# Patient Record
Sex: Male | Born: 1935
Health system: Southern US, Community
[De-identification: ages and names within clinical notes are randomized; demographics above are authoritative.]

## PROBLEM LIST (undated history)

## (undated) DIAGNOSIS — E785 Hyperlipidemia, unspecified: Secondary | ICD-10-CM

## (undated) DIAGNOSIS — Z87442 Personal history of urinary calculi: Secondary | ICD-10-CM

## (undated) DIAGNOSIS — Z953 Presence of xenogenic heart valve: Secondary | ICD-10-CM

## (undated) DIAGNOSIS — K219 Gastro-esophageal reflux disease without esophagitis: Secondary | ICD-10-CM

## (undated) DIAGNOSIS — I639 Cerebral infarction, unspecified: Secondary | ICD-10-CM

## (undated) DIAGNOSIS — Z87891 Personal history of nicotine dependence: Secondary | ICD-10-CM

## (undated) DIAGNOSIS — G509 Disorder of trigeminal nerve, unspecified: Secondary | ICD-10-CM

## (undated) DIAGNOSIS — M1990 Unspecified osteoarthritis, unspecified site: Secondary | ICD-10-CM

## (undated) DIAGNOSIS — K3189 Other diseases of stomach and duodenum: Secondary | ICD-10-CM

## (undated) DIAGNOSIS — I10 Essential (primary) hypertension: Secondary | ICD-10-CM

## (undated) DIAGNOSIS — R002 Palpitations: Secondary | ICD-10-CM

## (undated) DIAGNOSIS — I2510 Atherosclerotic heart disease of native coronary artery without angina pectoris: Secondary | ICD-10-CM

## (undated) DIAGNOSIS — I739 Peripheral vascular disease, unspecified: Secondary | ICD-10-CM

## (undated) DIAGNOSIS — R42 Dizziness and giddiness: Secondary | ICD-10-CM

## (undated) DIAGNOSIS — R1013 Epigastric pain: Secondary | ICD-10-CM

## (undated) DIAGNOSIS — I4892 Unspecified atrial flutter: Secondary | ICD-10-CM

## (undated) DIAGNOSIS — I4891 Unspecified atrial fibrillation: Secondary | ICD-10-CM

## (undated) DIAGNOSIS — R001 Bradycardia, unspecified: Secondary | ICD-10-CM

## (undated) DIAGNOSIS — Z951 Presence of aortocoronary bypass graft: Secondary | ICD-10-CM

## (undated) DIAGNOSIS — N529 Male erectile dysfunction, unspecified: Secondary | ICD-10-CM

## (undated) DIAGNOSIS — I359 Nonrheumatic aortic valve disorder, unspecified: Secondary | ICD-10-CM

## (undated) HISTORY — DX: Nonrheumatic aortic valve disorder, unspecified: I35.9

## (undated) HISTORY — DX: Hyperlipidemia, unspecified: E78.5

## (undated) HISTORY — DX: Cerebral infarction, unspecified: I63.9

## (undated) HISTORY — DX: Epigastric pain: R10.13

## (undated) HISTORY — DX: Personal history of nicotine dependence: Z87.891

## (undated) HISTORY — DX: Palpitations: R00.2

## (undated) HISTORY — PX: LITHOTRIPSY: SUR834

## (undated) HISTORY — PX: CATARACT EXTRACTION: SUR2

## (undated) HISTORY — DX: Other diseases of stomach and duodenum: K31.89

## (undated) HISTORY — PX: MOUTH SURGERY: SHX715

## (undated) HISTORY — DX: Male erectile dysfunction, unspecified: N52.9

## (undated) HISTORY — DX: Peripheral vascular disease, unspecified: I73.9

## (undated) HISTORY — DX: Essential (primary) hypertension: I10

## (undated) HISTORY — DX: Atherosclerotic heart disease of native coronary artery without angina pectoris: I25.10

---

## 2002-07-16 ENCOUNTER — Encounter: Admission: RE | Admit: 2002-07-16 | Discharge: 2002-07-16 | Payer: Self-pay | Admitting: Family Medicine

## 2002-07-16 ENCOUNTER — Encounter: Payer: Self-pay | Admitting: Family Medicine

## 2002-08-07 ENCOUNTER — Ambulatory Visit (HOSPITAL_BASED_OUTPATIENT_CLINIC_OR_DEPARTMENT_OTHER): Admission: RE | Admit: 2002-08-07 | Discharge: 2002-08-07 | Payer: Self-pay | Admitting: Urology

## 2002-08-07 ENCOUNTER — Encounter: Payer: Self-pay | Admitting: Urology

## 2004-02-12 ENCOUNTER — Ambulatory Visit: Payer: Self-pay | Admitting: Cardiology

## 2004-02-12 ENCOUNTER — Inpatient Hospital Stay (HOSPITAL_COMMUNITY): Admission: EM | Admit: 2004-02-12 | Discharge: 2004-02-13 | Payer: Self-pay | Admitting: Emergency Medicine

## 2004-02-17 ENCOUNTER — Ambulatory Visit: Payer: Self-pay

## 2005-04-28 ENCOUNTER — Ambulatory Visit: Payer: Self-pay

## 2005-04-28 ENCOUNTER — Encounter: Payer: Self-pay | Admitting: Cardiology

## 2005-05-03 ENCOUNTER — Ambulatory Visit: Payer: Self-pay | Admitting: Cardiology

## 2005-05-26 ENCOUNTER — Ambulatory Visit: Payer: Self-pay

## 2005-05-26 ENCOUNTER — Ambulatory Visit: Payer: Self-pay | Admitting: Cardiology

## 2007-06-06 ENCOUNTER — Ambulatory Visit: Payer: Self-pay

## 2007-06-06 ENCOUNTER — Ambulatory Visit: Payer: Self-pay | Admitting: Cardiology

## 2007-06-06 ENCOUNTER — Encounter: Payer: Self-pay | Admitting: Cardiology

## 2009-06-03 DIAGNOSIS — R079 Chest pain, unspecified: Secondary | ICD-10-CM | POA: Insufficient documentation

## 2009-06-03 DIAGNOSIS — I10 Essential (primary) hypertension: Secondary | ICD-10-CM | POA: Insufficient documentation

## 2009-06-03 DIAGNOSIS — E785 Hyperlipidemia, unspecified: Secondary | ICD-10-CM | POA: Insufficient documentation

## 2009-06-03 DIAGNOSIS — K3189 Other diseases of stomach and duodenum: Secondary | ICD-10-CM | POA: Insufficient documentation

## 2009-06-03 DIAGNOSIS — E7800 Pure hypercholesterolemia, unspecified: Secondary | ICD-10-CM | POA: Insufficient documentation

## 2009-06-03 DIAGNOSIS — R1013 Epigastric pain: Secondary | ICD-10-CM

## 2009-06-04 ENCOUNTER — Ambulatory Visit: Payer: Self-pay | Admitting: Cardiology

## 2009-06-04 ENCOUNTER — Encounter: Payer: Self-pay | Admitting: Cardiology

## 2009-06-04 ENCOUNTER — Ambulatory Visit: Payer: Self-pay | Admitting: Cardiovascular Disease

## 2009-06-04 ENCOUNTER — Ambulatory Visit: Payer: Self-pay

## 2009-06-04 ENCOUNTER — Ambulatory Visit (HOSPITAL_COMMUNITY): Admission: RE | Admit: 2009-06-04 | Discharge: 2009-06-04 | Payer: Self-pay | Admitting: Cardiology

## 2009-06-04 DIAGNOSIS — F17200 Nicotine dependence, unspecified, uncomplicated: Secondary | ICD-10-CM | POA: Insufficient documentation

## 2009-11-03 ENCOUNTER — Encounter: Admission: RE | Admit: 2009-11-03 | Discharge: 2009-11-03 | Payer: Self-pay | Admitting: Family Medicine

## 2009-12-17 ENCOUNTER — Ambulatory Visit: Payer: Self-pay | Admitting: Family Medicine

## 2010-02-11 ENCOUNTER — Encounter
Admission: RE | Admit: 2010-02-11 | Discharge: 2010-02-11 | Payer: Self-pay | Source: Home / Self Care | Attending: Neurology | Admitting: Neurology

## 2010-04-05 NOTE — Assessment & Plan Note (Signed)
Summary: FLU SHOT/EVM  Nurse Visit   Allergies: No Known Drug Allergies  Immunizations Administered:  Influenza Vaccine:    Vaccine Type: FLUZONE    Site: left deltoid    Mfr: Sanofi Pasteur    Dose: 0.5 ml    Route: IM    Given by: Levonne Spiller EMT-P    Exp. Date: 09/03/2010    Lot #: [id number]    VIS given: 09/28/09 version given December 17, 2009.   Immunizations Administered:  Influenza Vaccine:    Vaccine Type: FLUZONE    Site: left deltoid    Mfr: Sanofi Pasteur    Dose: 0.5 ml    Route: IM    Given by: Levonne Spiller EMT-P    Exp. Date: 09/03/2010    Lot #: [id number]    VIS given: 09/28/09 version given December 17, 2009.  Flu Vaccine Consent Questions:    Do you have a history of severe allergic reactions to this vaccine? no    Any prior history of allergic reactions to egg and/or gelatin? no    Do you have a sensitivity to the preservative Thimersol? no    Do you have a past history of Guillan-Barre Syndrome? no    Do you currently have an acute febrile illness? no    Have you ever had a severe reaction to latex? no    Vaccine information given and explained to patient? yes

## 2010-04-05 NOTE — Assessment & Plan Note (Signed)
Summary: 2 YR   Primary Provider:  Manus Gunning  CC:  2 year return.  Pt has no cardiac concerns at this time.  Sean Brock  History of Present Illness: The patient is a 76 years old and returns for followup management of aortic stenosis. He has known aortic valve stenosis and had an echocardiogram in April 2009 which showed a mean aortic valve gradient of 17 and a posterior wall thickness of 12 mm. He's done well since that time has had no symptoms related to his heart. The siblings had no chest pain shortness of breath or palpitations.  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 He academy is going through quite a bit of changes recently.  his wife is a Engineer, civil (consulting).  His other problems include hypertension, hyperlipidemia, and continued cigarette use. He also has a brother who has had 2 bypass surgeries.  Current Medications (verified): 1)  Flomax 0.4 Mg Caps (Tamsulosin Hcl) .... Take One Capsule Daily 2)  Toprol Xl 50 Mg Xr24h-Tab (Metoprolol Succinate) .... Take One Tablet Once Daily 3)  Crestor 10 Mg Tabs (Rosuvastatin Calcium) .... Take One Tablet Once Daily 4)  Aspirin 81 Mg Tabs (Aspirin) .... Take One Tablet Once Daily 5)  Triamterene-Hctz 37.5-25 Mg Tabs (Triamterene-Hctz) .... Take One Tablet Once Daily 6)  Viagra 50 Mg Tabs (Sildenafil Citrate) .... Uad 7)  Fish Oil 1000 Mg Caps (Omega-3 Fatty Acids) .... Take One [place] Two Times A Day  Allergies (verified): No Known Drug Allergies  Past History:  Past Medical History: Reviewed history from 06/03/2009 and no changes required. Current Problems:  PALPITATIONS (ICD-785.1) CHEST PAIN (ICD-786.50) HYPERLIPIDEMIA (ICD-272.4) AORTIC STENOSIS (ICD-424.1) HYPERTENSION (ICD-401.9) DYSPEPSIA (ICD-536.8)  Review of Systems       ROS is negative except as outlined in HPI.   Vital Signs:  Patient profile:   76 year old male Height:      74.5 inches Weight:      203 pounds BMI:     25.81 Pulse rate:   44 / minute Pulse rhythm:    regular BP sitting:   146 / 82  (left arm) Cuff size:   large  Vitals Entered By: Judithe Modest CMA (June 04, 2009 3:17 PM)  Physical Exam  Additional Exam:  Gen. Well-nourished, in no distress   Neck: No JVD, thyroid not enlarged, no carotid bruits Lungs: No tachypnea, clear without rales, rhonchi or wheezes Cardiovascular: Rhythm regular, PMI not displaced,  heart sounds  normal, no murmurs or gallops, no peripheral edema, pulses normal in all 4 extremities. Abdomen: BS normal, abdomen soft and non-tender without masses or organomegaly, no hepatosplenomegaly. MS: No deformities, no cyanosis or clubbing   Neuro:  No focal sns   Skin:  no lesions    Impression & Recommendations:  Problem # 1:  AORTIC STENOSIS (ICD-424.1) He has known aortic stenosis. Is not having any symptoms. I reviewed his echocardiogram from today and his mean aortic valve gradient was 18 mm. His left ventricular wall thickness was normal and his LV function was normal. We will continue to follow this and I have arranged for him to have a follow up visit in 2 years with an echocardiogram repeated at that time. He will see Dr. Shirlee Latch at that time. His updated medication list for this problem includes:    Toprol Xl 50 Mg Xr24h-tab (Metoprolol succinate) .Sean Brock... Take one tablet once daily    Triamterene-hctz 37.5-25 Mg Tabs (Triamterene-hctz) .Sean Brock... Take one tablet once daily  Orders:  EKG w/ Interpretation (93000)  Problem # 2:  HYPERTENSION (ICD-401.9) His blood pressure slightly elevated today but he says it normally runs in the 130s. We will continue current therapy. His updated medication list for this problem includes:    Toprol Xl 50 Mg Xr24h-tab (Metoprolol succinate) .Sean Brock... Take one tablet once daily    Aspirin 81 Mg Tabs (Aspirin) .Sean Brock... Take one tablet once daily    Triamterene-hctz 37.5-25 Mg Tabs (Triamterene-hctz) .Sean Brock... Take one tablet once daily  Problem # 3:  HYPERLIPIDEMIA (ICD-272.4) This is  currently being treated with Crestor. His updated medication list for this problem includes:    Crestor 10 Mg Tabs (Rosuvastatin calcium) .Sean Brock... Take one tablet once daily  Problem # 4:  CIGARETTE SMOKER (ICD-305.1)  He continues to smoke. I strongly encouraged him with his multiple risk factors are her decreasing and discontinuing cigarette smoking.  Patient Instructions: 1)  Your physician wants you to follow-up in: 2 years with Dr. Shirlee Latch.  You will receive a reminder letter in the mail two months in advance. If you don't receive a letter, please call our office to schedule the follow-up appointment. 2)  You will be scheduled for an echocardiogram in 2 years when you come to see Dr. Shirlee Latch.

## 2010-04-21 ENCOUNTER — Other Ambulatory Visit: Payer: Self-pay | Admitting: Family Medicine

## 2010-04-21 DIAGNOSIS — E041 Nontoxic single thyroid nodule: Secondary | ICD-10-CM

## 2010-04-29 ENCOUNTER — Ambulatory Visit
Admission: RE | Admit: 2010-04-29 | Discharge: 2010-04-29 | Disposition: A | Discharge status: Home / Self Care | Payer: Medicare Other | Source: Ambulatory Visit | Attending: Family Medicine | Admitting: Family Medicine

## 2010-04-29 DIAGNOSIS — E041 Nontoxic single thyroid nodule: Secondary | ICD-10-CM

## 2010-07-19 NOTE — Assessment & Plan Note (Signed)
[facility]                            CARDIOLOGY OFFICE NOTE   GRANVILLE, WHITEFIELD                      MRN:          [id number]  DATE:06/06/2007                            DOB:          February 23, 1935    PRIMARY CARE PHYSICIAN:  Bryan Lemma. Manus Gunning, M.D.   CLINICAL HISTORY:  Mr. Gasaway is 76 years old and returns for followup  management of his aortic stenosis.  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  He says he has been doing quite well.  Has had no recent chest pain, shortness breath or palpitations.  He has  been a smoker and he still is intermittently smoking.  His last  echocardiogram 2 years ago showed mean aortic valve gradient of 16 mm  and he had one done today which showed a mean gradient of 17 mm.  His LV  function was normal.  His LV posterior wall thickness was borderline,  increased at 12 mm.   PAST MEDICAL HISTORY:  1. Hypertension.  2. Cigarette use.   FAMILY HISTORY:  He also has a positive family history of coronary heart  disease with a brother who had bypass surgery.   CURRENT MEDICATIONS:  Aspirin, metoprolol, Crestor and  hydrochlorothiazide, Megace, and fish oil.   PHYSICAL EXAMINATION:  VITAL SIGNS:  Blood pressure 148/82 and pulse 48  and regular.  NECK:  There was no venous tension.  The carotid pulses were full  without bruits.  Carotid pulses were felt normal and there were faintly  transmitted bruits.  CHEST:  Was clear without rales or rhonchi.  HEART:  Rhythm is regular.  There was a 3/6 harsh systolic ejection  murmur at the left sternal edge radiating to the base and faintly to the  neck.  S2 was single and I could hear no diastolic murmur.  ABDOMEN:  Soft with normal bowel sounds.  There is no  hepatosplenomegaly.  EXTREMITIES:  Peripheral pulses were full. No peripheral edema.   LABORATORY DATA:  An electrocardiogram was normal.   IMPRESSION:  1. Mild to moderate aortic  stenosis, asymptomatic.  2. Hypertension under optimal control.  3. Cigarette use.  4. Positive family history of coronary artery disease.  5. Hyperlipidemia   RECOMMENDATIONS:  Mr. Gentzler remains asymptomatic and his aortic  stenosis does not appear to have progressed over the last 2 years.  I  told him he was as much at risk from coronary disease as from his aortic  stenosis and I gave him strong encouragement to smoke his cigarettes.  Blood pressures also not on target.  His wife is a Engineer, civil (consulting) and will check  his blood pressure at home.  I advised him to let Dr. Manus Gunning know if  his pressure is over 130 on a regular basis.  I will plan to see him  back in 2 years and we will do an echocardiogram prior to that visit.     Bruce Elvera Lennox Juanda Chance, MD, [facility]  Electronically Signed    BRB/MedQ  DD: 06/06/2007  DT: 06/06/2007  Job #: [id number]

## 2010-07-22 NOTE — Discharge Summary (Signed)
NAMEANDONI, BUSCH               ACCOUNT NO.:  [id number]   MEDICAL RECORD NO.:  [id number]          PATIENT TYPE:  INP   LOCATION:  3733                         FACILITY:  [facility]   PHYSICIAN:  Olga Millers, M.D. LHCDATE OF BIRTH:  [birth year]   DATE OF ADMISSION:  02/12/2004  DATE OF DISCHARGE:  02/13/2004                                 DISCHARGE SUMMARY   DISCHARGE DIAGNOSES:  1.  Chest pain, etiology unclear.  2.  Palpitations.      1.  Premature ventricular contractions noted on monitor and          electrocardiogram.  3.  History of coronary artery disease by heart catheterization in 1984.      1.  90% stenosis of the right coronary artery and complete occlusion of          the posterior descending artery, well preserved left ventricular          wall motion at that time--medical therapy recommended.  4.  Dyspepsia.  5.  Positive tobacco abuse.   HOSPITAL COURSE:  Please see the admission history and physical from  February 12, 2004, for complete details. Briefly, this 76 year old male  presented upon referral to the ER by his primary care physician. The patient  was complaining of palpitations and atypical chest discomfort. He was noted  to have PVCs on echocardiogram and telemetry. He was placed on low dose beta  blocker therapy with Lopressor 12.5 mg b.i.d. He was kept on his aspirin. He  was also placed on Protonix for GI prophylaxis as well as for treatment of  his dyspepsia. He had noticed that this had worsened recently. Enzymes were  cycled. At the time of this dictation those have been negative and his last  set is still pending. As long as his last set of enzymes are negative he  will be discharged to home. We plan to follow up with an outpatient stress  study in our office next week. The patient has also has a murmur on exam  that sounds like mild alert aortic stenosis. He will have an echocardiogram  next week as well. We will bring him back and follow up with  Dr. Juanda Chance  after the tests are complete.   LABORATORY DATA:  White count 8000, hemoglobin 14.8, hematocrit 42.3,  platelet count 278,000. INR 0.9.  Sodium 137, potassium 4, chloride 104, CO2  26, glucose 97, BUN 14, creatinine 0.8, total bilirubin 0.9, alkaline  phosphatase 37, AST 13, ALT 17, total protein 6.3, albumin 3.9, calcium 8.6.  Total CK on December 9th at 12:00 117, MB 3.5, troponin-I 0.01. On December  9th at 16:40, CK 102, CK-MB 3.0, troponin 0.01. on December 9th at 23:30, CK  96, MB 2.5, troponin-I 0.02.   Admission chest x-ray revealed no active disease.   Discharge vital signs are temperature 97.9, pulse 54, respirations 16, blood  pressure 106/66, oxygen saturation 96% on room air.   DISCHARGE MEDICATIONS:  1.  Protonix 40 mg daily.  2.  Toprol XL 25 mg daily.  3.  Enteric-coated aspirin 81 mg daily.  PAIN MANAGEMENT:  Tylenol as needed. He is to call office or 9-1-1 for  recurrent chest pain.   ACTIVITY:  Nothing strenuous until after his stress test.   DIET:  Low fat, low sodium. He has been asked to reduce his caffeine intake.   FOLLOWUP:  The patient will have a stress test with [facility] on  December 14th at 12:30 p.m. He will have an echocardiogram with [facility] on December 16th at 7:30 a.m. He will see Dr. Juanda Chance on March 09, 2004, at 3 p.m. He should follow up with his primary care physician, Dr.  Manus Gunning, as scheduled.       SW/MEDQ  D:  02/13/2004  T:  02/13/2004  Job:  [id number]   cc:   Cecil Cranker, M.D.   Bryan Lemma. Manus Gunning, M.D.  [address] [place]  [place] [address]  Fax: [phone]

## 2010-07-22 NOTE — H&P (Signed)
NAMEGILES, CURRIE NO.:  [id number]   MEDICAL RECORD NO.:  [id number]          PATIENT TYPE:  EMS   LOCATION:  MAJO                         FACILITY:  [facility]   PHYSICIAN:  Olga Millers, M.D. LHCDATE OF BIRTH:  Aug 06, 1934   DATE OF ADMISSION:  02/12/2004  DATE OF DISCHARGE:                                HISTORY & PHYSICAL   PRIMARY CARE PHYSICIAN:  Bryan Lemma. Manus Gunning, M.D.   PRIMARY CARDIOLOGIST:  Charlies Constable, M.D. [facility]   SUMMARY OF HISTORY:  Mr. Laraia is a 76 year old white male who is referred  to [facility] emergency room by his primary care physician secondary to  frequent PVCs. Mr. Tsutsui states that earlier this week while at work on  Monday he noticed what he describes as a nervous  heartbeat. He denied any  associated symptoms or rapid rate. However, he did stop his work and walk  outside to get some fresh air and this relieved his nervous heart beat.  The duration was approximately ten minutes. On Tuesday and Wednesday, he had  similar episodes. On Thursday evening, however, at approximately 6 or 7 p.m.  he again developed a nervous heart beat. He said that this was intermittent  over the next four to five hours. This seemed to improve with exertion. He  also noted some indigestion in the xiphoid with increased gas. He did not  experience any radiation of the discomfort, shortness of breath, nausea,  vomiting, or diaphoresis. He did state that he felt hot.  This morning,  initially when he awoke he felt fine; however, after getting up out of bed  he again developed similar symptoms. He went to his primary care physician  who noted increased PVCs and sinus bradycardia on his EKGs and referred him  to [place].  Mr. Ferris denies any prior history nor has he had any problems with  chest discomfort, exertional fatigue, shortness of breath, dyspnea on  exertion, or syncopal episodes.   PAST MEDICAL HISTORY:  The patient developed a rash with Cardizem. He is  on  aspirin 81 mg daily. His history is notable for cardiac catheterization in  1984 for exertional fatigue. Catheterization on November 30, 1982 by Dr.  Juanda Chance showed a 40% mid LAD, 90% proximal RCA, and 70% distal diffusely  narrowing, but a faint inconsistent PL branches, and a total PDA. He had  good left to right collaterals. LV function was reported to be normal. Dr.  Juanda Chance felt that he had one-vessel coronary artery disease which should be  medically managed. It was also noted that in 1990 he was admitted overnight  by Dr. Alanda Amass for palpitations. The patient denies any history of  diabetes, hypertension, COPD, MI, CVA, bleeding dyscrasias, thyroid  dysfunction, and he does not know his cholesterol.   PAST SURGICAL HISTORY:  Bilateral cataract repair and kidney stone.  According to his past discharge summary, he does have a history of  hyperlipidemia, particularly triglycerides and a history of hypertension.   SOCIAL HISTORY:  He resides in [place] with his wife and son. 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  No  grandchildren. He smokes half a pack per day intermittently for the last 40  years. He drinks a glass of homemade wine mixed with diet Coke one time per  week. He denies any drugs, herbal medication, diet, or exercise program.   FAMILY HISTORY:  Mother died at the age of 25 of diabetes and post  myocardial infarction. His father died at the age of 2 with heart problems  and a  history of hypertension. One sister deceased secondary to lung  cancer, age 32. One brother and one sister are alive. His brother has had  two bypass surgeries and his sister also has heart problems.   REVIEW OF SYSTEMS:  Notable for chronic sinus problems, vertigo, glasses,  cough productive of white phlegm, increased stress at work, bilateral  shoulder and hip arthralgias, and GERD.   PHYSICAL EXAMINATION:  GENERAL: A well-nourished, well-developed,  pleasant  white male in no apparent distress.  VITAL SIGNS: Temperature 96.5, blood pressure 155/85, pulse 61 and slightly  irregular, respirations 16, and 98% on room air.  HEENT: Unremarkable except for glasses.  NECK: Supple without thyromegaly, adenopathy, or JVD. He does have a right  carotid bruit. It is difficult to discern if he has a left carotid bruits  versus radiated murmur.  HEART: Slightly irregular, normal S1 and S2. He has a 2/6  crescendo/decrescendo systolic murmur best appreciated at the left sternal  border. It is difficult to tell if it radiates into the carotids. It does  intensify with sitting up and leaning forward. All pulses are symmetrical  and intact without femoral or abdominal bruits.  LUNGS: Decreased breath sounds, but clear to auscultation without rales,  rhonchi, or wheezes.  SKIN: Integument intact.  ABDOMEN: Bowel sounds present, soft without organomegaly, masses, or  tenderness.  EXTREMITIES: No clubbing, cyanosis, or edema.  MUSCULOSKELETAL/NEUROLOGIC: Unremarkable.   Chest x-ray is pending. EKG shows sinus bradycardia with normal axis, normal  intervals. Old EKG is not available for comparison.   H&H is 14.8 and 42.3 with normal indices, platelet count 278,000, WBC 8.0,  sodium 137, potassium 4.0, BUN 14, creatinine 0.9, glucose 97, normal LFTs.  Initial CK and troponin in the emergency room are negative. PTT 32, PT 12.7.   IMPRESSION:  Palpitations. Rule out frequent premature ventricular  contractions, nonsustained ventricular tachycardia, atypical chest  discomfort with above, systolic murmur, known history of coronary artery  disease by cardiac catheterization in 1984 without evaluation since that  time.   DISPOSITION:  Dr. Jens Som has reviewed the patient's history, spoke with  and examined the patient. We will admit him for overnight observation, cycle enzymes to rule out myocardial infarction.  We will continue him on  telemetry to  rule out ventricular tachycardia. If bloodwork is negative,  maybe he will be discharged in the morning with an outpatient stress test  that has been set up for Wednesday, February 17, 2004, at 12:30 p.m. and an  echocardiogram at February 19, 2004, at 7:30 a.m. He will then follow up  with Dr. Juanda Chance on March 09, 2004, at 3 p.m. for these tests. Depending on  findings he may need an outpatient event monitor. During his hospital  admission we will continue his baby aspirin daily. We will also add Protonix  40 mg daily and low dose Lopressor.       EW/MEDQ  D:  02/12/2004  T:  02/12/2004  Job:  [id number]  cc:   Bryan Lemma. Manus Gunning, M.D.  [address] [place]  [place] [address]  Fax: [phone]   Charlies Constable, M.D. [facility]

## 2011-06-05 ENCOUNTER — Ambulatory Visit: Payer: Medicare Other | Admitting: Cardiology

## 2011-06-08 ENCOUNTER — Encounter: Payer: Self-pay | Admitting: *Deleted

## 2011-06-09 ENCOUNTER — Encounter: Payer: Self-pay | Admitting: Cardiology

## 2011-06-09 ENCOUNTER — Ambulatory Visit (INDEPENDENT_AMBULATORY_CARE_PROVIDER_SITE_OTHER): Payer: Medicare Other | Admitting: Cardiology

## 2011-06-09 VITALS — BP 122/72 | HR 49 | Resp 19 | Ht 73.0 in | Wt 208.8 lb

## 2011-06-09 DIAGNOSIS — I498 Other specified cardiac arrhythmias: Secondary | ICD-10-CM

## 2011-06-09 DIAGNOSIS — R001 Bradycardia, unspecified: Secondary | ICD-10-CM

## 2011-06-09 DIAGNOSIS — I359 Nonrheumatic aortic valve disorder, unspecified: Secondary | ICD-10-CM

## 2011-06-09 DIAGNOSIS — I350 Nonrheumatic aortic (valve) stenosis: Secondary | ICD-10-CM

## 2011-06-09 DIAGNOSIS — E785 Hyperlipidemia, unspecified: Secondary | ICD-10-CM

## 2011-06-09 NOTE — Assessment & Plan Note (Signed)
Some mild lightheadedness with standing.  I do not think this is likely to be related to AS.  Mild to moderate AS on last echo in 2011.  He has an AS-type murmur, suspect moderate by murmur.  I will arrange a repeat echocardiogram.

## 2011-06-09 NOTE — Assessment & Plan Note (Signed)
Followed by Dr. Manus Gunning.

## 2011-06-09 NOTE — Assessment & Plan Note (Signed)
Occasional lightheadedness with standing and HR in the 40s.  He takes short-acting metoprolol once a day. I will stop metoprolol.  We will call him in 2 wks to see what his BP and pulse is running.  He will check daily.

## 2011-06-09 NOTE — Progress Notes (Signed)
PCP: Dr. Manus Gunning  77 yo with history of HTN and aortic stenosis presents for cardiology followup.  He has been seen by Dr. Juanda Chance in the past and is seen by me for the first time today.  He has been doing well since last appointment.  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  HR is in the 40s today.  He is occasionally lightheaded with standing.  No syncope.  He is taking metoprolol 50 mg once daily.  He occasionally checks his BP, it is always < 140 systolic.  No chest pain, no exertional dyspnea.  Only limitation to activity has been hip pain.    ECG: sinus bradycardia at 46  PMH: 1. HTN 2. Hyperlipidemia 3. Vertigo 4. Aortic stenosis: Mild to moderate.  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  Married.  Nonsmoker.   FH: Brother with CABG.  Sister with aortic stenosis s/p AVR  ROS: All systems reviewed and negative except as per HPI.   Current Outpatient Prescriptions  Medication Sig Dispense Refill  . aspirin 81 MG tablet Take 81 mg by mouth daily.      . fish oil-omega-3 fatty acids 1000 MG capsule Take 2 g by mouth daily.      . rosuvastatin (CRESTOR) 10 MG tablet Take 10 mg by mouth daily.      . sildenafil (VIAGRA) 50 MG tablet Take 50 mg by mouth daily as needed.      . Tamsulosin HCl (FLOMAX) 0.4 MG CAPS Take by mouth.      . triamterene-hydrochlorothiazide (DYAZIDE) 37.5-25 MG per capsule Take 1 capsule by mouth every morning.        BP 122/72  Pulse 49  Resp 19  Ht 6\' 1"  (1.854 m)  Wt 208 lb 12.8 oz (94.711 kg)  BMI 27.55 kg/m2 General: NAD Neck: No JVD, no thyromegaly or thyroid nodule.  Lungs: Clear to auscultation bilaterally with normal respiratory effort. CV: Nondisplaced PMI.  Heart regular S1/S2, no S3/S4, 3/6 mid-peaking systolic murmur, S2 is heard clearly.  No peripheral edema.  No carotid bruit.  Normal pedal pulses.  Abdomen: Soft, nontender, no  hepatosplenomegaly, no distention.  Neurologic: Alert and oriented x 3.  Psych: Normal affect. Extremities: No clubbing or cyanosis.

## 2011-06-09 NOTE — Patient Instructions (Signed)
Stop metoprolol.  Take and record your blood pressure and pulse every day. I will call you in 2 weeks to get the readings. Luana Shu [phone]  Your physician has requested that you have an echocardiogram. Echocardiography is a painless test that uses sound waves to create images of your heart. It provides your doctor with information about the size and shape of your heart and how well your heart's chambers and valves are working. This procedure takes approximately one hour. There are no restrictions for this procedure. In the next week or so.  Your physician wants you to follow-up in: 1 year with Dr Shirlee Latch. (April 2014).You will receive a reminder letter in the mail two months in advance. If you don't receive a letter, please call our office to schedule the follow-up appointment.

## 2011-06-14 ENCOUNTER — Ambulatory Visit (INDEPENDENT_AMBULATORY_CARE_PROVIDER_SITE_OTHER): Payer: Medicare Other | Admitting: *Deleted

## 2011-06-14 ENCOUNTER — Telehealth: Payer: Self-pay | Admitting: *Deleted

## 2011-06-14 VITALS — BP 154/86 | HR 60 | Ht 73.0 in | Wt 208.0 lb

## 2011-06-14 DIAGNOSIS — I10 Essential (primary) hypertension: Secondary | ICD-10-CM

## 2011-06-14 MED ORDER — AMLODIPINE BESYLATE 2.5 MG PO TABS: 2.5000 mg | ORAL_TABLET | Freq: Every day | ORAL | Status: DC

## 2011-06-14 NOTE — Progress Notes (Signed)
Walk-in patient C/O of his blood pressure going up every day since Metoprolol was d/C on 06/09/11. B/P today is 154/86 pulse 60 beats/minute. Dr. Shirlee Latch recommends to start on Amlodipine 2.5 mg by mouth once a day . Patient aware, order send to [facility] pharmacy.

## 2011-06-14 NOTE — Telephone Encounter (Signed)
Walk-in pt seen by Nurse. See nurse visit note.

## 2011-06-16 ENCOUNTER — Other Ambulatory Visit: Payer: Self-pay

## 2011-06-16 ENCOUNTER — Ambulatory Visit (HOSPITAL_COMMUNITY): Payer: Medicare Other | Attending: Internal Medicine

## 2011-06-16 DIAGNOSIS — I079 Rheumatic tricuspid valve disease, unspecified: Secondary | ICD-10-CM | POA: Insufficient documentation

## 2011-06-16 DIAGNOSIS — R079 Chest pain, unspecified: Secondary | ICD-10-CM | POA: Insufficient documentation

## 2011-06-16 DIAGNOSIS — R002 Palpitations: Secondary | ICD-10-CM | POA: Insufficient documentation

## 2011-06-16 DIAGNOSIS — I498 Other specified cardiac arrhythmias: Secondary | ICD-10-CM | POA: Insufficient documentation

## 2011-06-16 DIAGNOSIS — I350 Nonrheumatic aortic (valve) stenosis: Secondary | ICD-10-CM

## 2011-06-16 DIAGNOSIS — I059 Rheumatic mitral valve disease, unspecified: Secondary | ICD-10-CM | POA: Insufficient documentation

## 2011-06-16 DIAGNOSIS — I359 Nonrheumatic aortic valve disorder, unspecified: Secondary | ICD-10-CM | POA: Insufficient documentation

## 2011-06-16 DIAGNOSIS — E785 Hyperlipidemia, unspecified: Secondary | ICD-10-CM | POA: Insufficient documentation

## 2011-06-16 DIAGNOSIS — I10 Essential (primary) hypertension: Secondary | ICD-10-CM | POA: Insufficient documentation

## 2011-07-04 ENCOUNTER — Telehealth: Payer: Self-pay | Admitting: Cardiology

## 2011-07-04 NOTE — Telephone Encounter (Signed)
Spoke with pt about BP readings pt brought into office. Amlodipine increased 06/30/11 by PCP. No new recommendations per Dr Shirlee Latch.

## 2011-07-04 NOTE — Telephone Encounter (Signed)
New Problem:     Patient returned your phone call.  Please call back. 

## 2011-07-11 ENCOUNTER — Encounter: Payer: Self-pay | Admitting: *Deleted

## 2011-07-11 ENCOUNTER — Encounter: Payer: Self-pay | Admitting: Physician Assistant

## 2011-07-11 ENCOUNTER — Ambulatory Visit (INDEPENDENT_AMBULATORY_CARE_PROVIDER_SITE_OTHER): Payer: Medicare Other | Admitting: Physician Assistant

## 2011-07-11 VITALS — BP 140/80 | HR 56 | Ht 74.0 in | Wt 210.8 lb

## 2011-07-11 DIAGNOSIS — R002 Palpitations: Secondary | ICD-10-CM

## 2011-07-11 DIAGNOSIS — R42 Dizziness and giddiness: Secondary | ICD-10-CM

## 2011-07-11 DIAGNOSIS — R079 Chest pain, unspecified: Secondary | ICD-10-CM

## 2011-07-11 LAB — BASIC METABOLIC PANEL
BUN: 17 mg/dL (ref 6–23)
CO2: 27 mEq/L (ref 19–32)
Calcium: 9.4 mg/dL (ref 8.4–10.5)
Chloride: 103 mEq/L (ref 96–112)
Creatinine, Ser: 0.8 mg/dL (ref 0.4–1.5)
GFR: 107.82 mL/min (ref >60.00)
Glucose, Bld: 82 mg/dL (ref 70–99)
Potassium: 3.6 mEq/L (ref 3.5–5.1)
Sodium: 140 mEq/L (ref 135–145)

## 2011-07-11 LAB — CBC WITH DIFFERENTIAL/PLATELET
Basophils Absolute: 0 10*3/uL (ref 0.0–0.1)
Basophils Relative: 0.7 % (ref 0.0–3.0)
Eosinophils Absolute: 0.1 10*3/uL (ref 0.0–0.7)
Eosinophils Relative: 1.8 % (ref 0.0–5.0)
HCT: 44.3 % (ref 39.0–52.0)
Hemoglobin: 14.9 g/dL (ref 13.0–17.0)
Lymphocytes Relative: 27.9 % (ref 12.0–46.0)
Lymphs Abs: 1.9 10*3/uL (ref 0.7–4.0)
MCHC: 33.6 g/dL (ref 30.0–36.0)
Monocytes Absolute: 0.7 10*3/uL (ref 0.1–1.0)
Monocytes Relative: 10 % (ref 3.0–12.0)
Neutro Abs: 4 10*3/uL (ref 1.4–7.7)
Platelets: 237 10*3/uL (ref 150.0–400.0)
RDW: 13.5 % (ref 11.5–14.6)

## 2011-07-11 LAB — TSH: TSH: 1.7 u[IU]/mL (ref 0.35–5.50)

## 2011-07-11 NOTE — Progress Notes (Signed)
[address] [place], [place]  [address] Phone: [phone] Fax:  [phone]  Date:  07/11/2011   Name:  Sean Brock   DOB:  [birth year]   MRN:  [id number]  PCP:  Thora Lance, MD, MD  Primary Cardiologist:  Dr. Marca Ancona  Primary Electrophysiologist:  None    History of Present Illness: Sean Brock is a 77 y.o. male who returns for evaluation of palpitations.    He has a history of CAD, HTN and aortic stenosis.  Previously followed by Dr. Juanda Chance in the past and established with Dr. Marca Ancona last month.  Heart rate was in the 40s last month and he noted occasional lightheadedness with standing.  Metoprolol was stopped. Amlodipine was added after follow up blood pressure check.    He notes more energy since stopping metoprolol.  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  He notes a long h/o PVCs.  He has been able to tolerate over the years.  Yesterday, his symptoms were worse.  He felt near syncopal.  He felt as though he would lose his balance.  He had some chest pressure that lasted for a few seconds.  There is no radiation, associated shortness of breath, nausea or diaphoresis.  It did make him feel anxious.  He continues to note occasional orthostasis with standing too quickly.  He does have a history of vertigo and questioned whether or not his symptoms were similar.  He does note some exertional chest pain.  This has been an ongoing symptom for many years without any significant changes.  He denies orthopnea, PND or edema.  He notes dyspnea with more extreme activities (probable class II).  2D Echocardiogram 06/16/11: Study Conclusions  - Left ventricle: The cavity size was normal. Wall thickness was increased in a pattern of severe LVH. Systolic function was normal. The estimated ejection fraction was in the range of 55% to 60%. Wall motion was normal; there were no regional  wall motion abnormalities. Doppler parameters are consistent with abnormal left ventricular relaxation (grade 1 diastolic dysfunction). - Aortic valve: AV is thickened, calcified with restricted motion. Peak and mean gradients through the valve are 53 and 26mm Hg consistent with mild to moderate AS.   Past Medical History  Diagnosis Date  . HYPERLIPIDEMIA   . HYPERTENSION   . AORTIC STENOSIS     Mild to moderate. Echo (4/11) with EF 55-60%, mean gradient 18/peak 34;  echo 4/13: severe LVH, EF 55-60%, grade 1 diast dysfxn, mean 26/peak 53 c/w mild to mod AS  . Former smoker   . DYSPEPSIA   . Palpitations   . CAD (coronary artery disease)     LHC 11/1982:  mLAD 40%, pRCA 90%, dRCA 70% diffuse,  PDA occluded, normal LVF - treated medically    Current Outpatient Prescriptions  Medication Sig Dispense Refill  . amLODipine (NORVASC) 5 MG tablet Take 1 tablet (5 mg total) by mouth daily.      Marland Kitchen aspirin 81 MG tablet Take 81 mg by mouth daily.      . fish oil-omega-3 fatty acids 1000 MG capsule Take 2 g by mouth daily.      . rosuvastatin (CRESTOR) 10 MG tablet Take 10 mg by mouth daily.      . sildenafil (VIAGRA) 50 MG tablet Take 50 mg by mouth daily as needed.      . Tamsulosin HCl (FLOMAX) 0.4 MG CAPS  Take by mouth.      . triamterene-hydrochlorothiazide (DYAZIDE) 37.5-25 MG per capsule Take 1 capsule by mouth every morning.        Allergies: No Known Allergies   History  Substance Use Topics  . Smoking status: Never Smoker   . Smokeless tobacco: Not on file  . Alcohol Use: Not on file     ROS:  Please see the history of present illness.    All other systems reviewed and negative.   PHYSICAL EXAM: VS:  BP 140/80  Pulse 56  Ht 6\' 2"  (1.88 m)  Wt 210 lb 12.8 oz (95.618 kg)  BMI 27.07 kg/m2 Well nourished, well developed, in no acute distress HEENT: normal Neck: no JVD Endocrine: no thyromegaly Cardiac:  normal S1, S2; RRR; 2/6 harsh crescendo/descrendo systolic murmur  loudest along LLSB Lungs:  clear to auscultation bilaterally, no wheezing, rhonchi or rales Abd: soft, nontender, no hepatomegaly Ext: no edema Skin: warm and dry Neuro:  CNs 2-12 intact, no focal abnormalities noted Psych: normal affect  EKG:  Sinus bradycardia, heart rate 56, normal axis, nonspecific ST-T wave changes, no change from prior tracing   ASSESSMENT AND PLAN:  1. Palpitations  Has a long h/o PVCs.  Episode yesterday was worse.  ECG today ok aside from sinus brady.  Recent echo with normal LVF.  Arrange event monitor.  Follow up in 4-6 weeks.    Check BMET, CBC, TSH.  2. Dizziness  Obtain event monitor to rule out significant arrhythmias or pauses.  3. Chest Pain  H/o CAD by cath in 1980s.  No apparent myoview in recent years.  Chest pain atypical and overall fairly stable.  Notes CP with palpitations yesterday.  Obtain Lexiscan myoview.  Patient states he cannot walk on treadmill.   4. Hypertension  Better controlled.    Continue current Rx for now.   5. Mild to Moderate Aortic Stenosis  Stable by recent echo   6. Coronary Artery Disease  Continue ASA and statin  Get myoview as noted.    Signed, Tereso Newcomer, PA-C  10:33 AM 07/11/2011

## 2011-07-11 NOTE — Patient Instructions (Signed)
Your physician has requested that you have a lexiscan myoview DX 786.50 CHEST PAIN. For further information please visit [email or web address] Please follow instruction sheet, as given.  Your physician recommends that you return for lab work in: TODAY BMET, CBC W/DIFF, TSH  Your physician has recommended that you wear an event monitor DX DIZZINESS AND PALPITATIONS. Event monitors are medical devices that record the heart's electrical activity. Doctors most often [place] these monitors to diagnose arrhythmias. Arrhythmias are problems with the speed or rhythm of the heartbeat. The monitor is a small, portable device. You can wear one while you do your normal daily activities. This is usually used to diagnose what is causing palpitations/syncope (passing out).   4-6 WEEKS WITH DR. Shirlee Latch

## 2011-07-18 ENCOUNTER — Encounter (INDEPENDENT_AMBULATORY_CARE_PROVIDER_SITE_OTHER): Payer: Medicare Other

## 2011-07-18 ENCOUNTER — Ambulatory Visit (HOSPITAL_COMMUNITY): Payer: Medicare Other | Attending: Cardiology | Admitting: Radiology

## 2011-07-18 VITALS — BP 153/82 | HR 60 | Ht 74.0 in | Wt 207.0 lb

## 2011-07-18 DIAGNOSIS — E785 Hyperlipidemia, unspecified: Secondary | ICD-10-CM | POA: Insufficient documentation

## 2011-07-18 DIAGNOSIS — R42 Dizziness and giddiness: Secondary | ICD-10-CM

## 2011-07-18 DIAGNOSIS — Z87891 Personal history of nicotine dependence: Secondary | ICD-10-CM | POA: Insufficient documentation

## 2011-07-18 DIAGNOSIS — R079 Chest pain, unspecified: Secondary | ICD-10-CM

## 2011-07-18 DIAGNOSIS — R0789 Other chest pain: Secondary | ICD-10-CM | POA: Insufficient documentation

## 2011-07-18 DIAGNOSIS — R0609 Other forms of dyspnea: Secondary | ICD-10-CM | POA: Insufficient documentation

## 2011-07-18 DIAGNOSIS — R0989 Other specified symptoms and signs involving the circulatory and respiratory systems: Secondary | ICD-10-CM | POA: Insufficient documentation

## 2011-07-18 DIAGNOSIS — R55 Syncope and collapse: Secondary | ICD-10-CM

## 2011-07-18 DIAGNOSIS — R002 Palpitations: Secondary | ICD-10-CM

## 2011-07-18 DIAGNOSIS — I2510 Atherosclerotic heart disease of native coronary artery without angina pectoris: Secondary | ICD-10-CM

## 2011-07-18 DIAGNOSIS — R000 Tachycardia, unspecified: Secondary | ICD-10-CM | POA: Insufficient documentation

## 2011-07-18 DIAGNOSIS — I10 Essential (primary) hypertension: Secondary | ICD-10-CM | POA: Insufficient documentation

## 2011-07-18 DIAGNOSIS — R0602 Shortness of breath: Secondary | ICD-10-CM

## 2011-07-18 DIAGNOSIS — Z8249 Family history of ischemic heart disease and other diseases of the circulatory system: Secondary | ICD-10-CM | POA: Insufficient documentation

## 2011-07-18 MED ORDER — TECHNETIUM TC 99M TETROFOSMIN IV KIT
33.0000 | PACK | Freq: Once | INTRAVENOUS | Status: AC | PRN
  Administered 2011-07-18: 33 via INTRAVENOUS

## 2011-07-18 MED ORDER — REGADENOSON 0.4 MG/5ML IV SOLN
0.4000 mg | Freq: Once | INTRAVENOUS | Status: AC
  Administered 2011-07-18: 0.4 mg via INTRAVENOUS

## 2011-07-18 MED ORDER — TECHNETIUM TC 99M TETROFOSMIN IV KIT
11.0000 | PACK | Freq: Once | INTRAVENOUS | Status: AC | PRN
  Administered 2011-07-18: 11 via INTRAVENOUS

## 2011-07-18 NOTE — Progress Notes (Signed)
[facility] SITE 3 NUCLEAR MED [address] [place] [address] [phone]  Cardiology Nuclear Med Study  Sean Brock is a 77 y.o. male     MRN : [id number]     DOB: 1934/03/18  Procedure Date: 07/18/2011  Nuclear Med Background Indication for Stress Test:  Evaluation for Ischemia History:  '84 Cath:CAD, EF=66%, med. tx; '07 MPS:No ischemia; 06/16/11 Echo:EF=55-60%, mild to moderate AS Cardiac Risk Factors: Family History - CAD, History of Smoking, Hypertension and Lipids  Symptoms:  Chest Pain/Pressure with and without Exertion (last episode of chest discomfort was yesterday), Dizziness, DOE and Rapid HR   Nuclear Pre-Procedure Caffeine/Decaff Intake:  None NPO After: 8:00pm   Lungs:  clear O2 Sat: 97% on room air. IV 0.9% NS with Angio Cath:  20g  IV Site: R Wrist  IV Started by:  Cathlyn Parsons, RN  Chest Size (in):  44 Cup Size: n/a  Height: 6\' 2"  (1.88 m)  Weight:  207 lb (93.895 kg)  BMI:  Body mass index is 26.58 kg/(m^2). Tech Comments:  n/a    Nuclear Med Study 1 or 2 day study: 1 day  Stress Test Type:  Treadmill/Lexiscan  Reading MD: Marca Ancona, MD  Order Authorizing Provider:  Marca Ancona, MD  Resting Radionuclide: Technetium 69m Tetrofosmin  Resting Radionuclide Dose: 11.0 mCi   Stress Radionuclide:  Technetium 97m Tetrofosmin  Stress Radionuclide Dose: 33.0 mCi           Stress Protocol Rest HR: 60 Stress HR: 92  Rest BP: 153/28 Stress BP: 144/83  Exercise Time (min): 2:00 METS: n/a   Predicted Max HR: 145 bpm % Max HR: 63.45 bpm Rate Pressure Product: 78469   Dose of Adenosine (mg):  n/a Dose of Lexiscan: 0.4 mg  Dose of Atropine (mg): n/a Dose of Dobutamine: n/a mcg/kg/min (at max HR)  Stress Test Technologist: Smiley Houseman, CMA-N  Nuclear Technologist:  Domenic Polite, CNMT     Rest Procedure:  Myocardial perfusion imaging was performed at rest 45 minutes following the intravenous administration of Technetium  16m Tetrofosmin.  Rest ECG: Nonspecific ST-T wave changes.  Stress Procedure:  The patient received IV Lexiscan 0.4 mg over 15-seconds with concurrent low level exercise and then Technetium 64m Tetrofosmin was injected at 30-seconds while the patient continued walking one more minute. There were no significant changes with Lexiscan bolus, occasional PVC's were noted. Quantitative spect images were obtained after a 45-minute delay.  Stress ECG: Insignificant upsloping ST segment depression.  QPS Raw Data Images:  Normal; no motion artifact; normal heart/lung ratio. Stress Images:  Normal homogeneous uptake in all areas of the myocardium. Rest Images:  Normal homogeneous uptake in all areas of the myocardium. Subtraction (SDS):  There is no evidence of scar or ischemia. Transient Ischemic Dilatation (Normal <1.22): 1.17 Lung/Heart Ratio (Normal <0.45):  0.32  Quantitative Gated Spect Images QGS EDV:  102 ml QGS ESV:  30 ml  Impression Exercise Capacity:  Lexiscan with low level exercise. BP Response:  Normal blood pressure response. Clinical Symptoms:  Chest tightness ECG Impression:  Insignificant upsloping ST segment depression. Comparison with Prior Nuclear Study: No significant change from previous study  Overall Impression:  Normal stress nuclear study.  LV Ejection Fraction: 70%.  LV Wall Motion:  NL LV Function; NL Wall Motion  [employer]

## 2011-07-19 ENCOUNTER — Telehealth: Payer: Self-pay | Admitting: *Deleted

## 2011-07-19 NOTE — Progress Notes (Signed)
Nuclear results from 07/18/11 routed to [employer] and [facility]. Domenic Polite

## 2011-07-19 NOTE — Telephone Encounter (Signed)
lmom stress test normal 

## 2011-07-19 NOTE — ED Provider Notes (Signed)
Order(s) created erroneously. Erroneous order ID: [id number] Order moved by: DOHERTY-CARBONE, Donika Butner M Order move date/time: 07/19/2011  9:43 AM Source Patient:    [id number] Source Contact: 08/30/2011 Destination Patient:    [id number] Destination Contact: 07/18/2011

## 2011-07-19 NOTE — Progress Notes (Signed)
Please inform patient stress test normal. Tereso Newcomer, PA-C  12:39 PM 07/19/2011

## 2011-07-19 NOTE — Progress Notes (Signed)
lmom stress test normal 

## 2011-07-19 NOTE — ED Provider Notes (Signed)
Order(s) created erroneously. Erroneous order ID: [id number] Order moved by: Tanna Savoy Order move date/time: 07/19/2011  9:43 AM Source Patient:    [id number] Source Contact: 08/30/2011 Destination Patient:    [id number] Destination Contact: 07/18/2011

## 2011-08-30 ENCOUNTER — Ambulatory Visit (INDEPENDENT_AMBULATORY_CARE_PROVIDER_SITE_OTHER): Payer: Medicare Other | Admitting: Cardiology

## 2011-08-30 ENCOUNTER — Encounter: Payer: Self-pay | Admitting: Cardiology

## 2011-08-30 VITALS — BP 142/70 | HR 55 | Ht 74.5 in | Wt 207.0 lb

## 2011-08-30 DIAGNOSIS — R001 Bradycardia, unspecified: Secondary | ICD-10-CM

## 2011-08-30 DIAGNOSIS — R002 Palpitations: Secondary | ICD-10-CM

## 2011-08-30 DIAGNOSIS — I359 Nonrheumatic aortic valve disorder, unspecified: Secondary | ICD-10-CM

## 2011-08-30 DIAGNOSIS — I498 Other specified cardiac arrhythmias: Secondary | ICD-10-CM

## 2011-08-30 DIAGNOSIS — R079 Chest pain, unspecified: Secondary | ICD-10-CM

## 2011-08-30 NOTE — Assessment & Plan Note (Signed)
Had episode associated with palpitations.  Myoview was normal.

## 2011-08-30 NOTE — Patient Instructions (Addendum)
Your physician wants you to follow-up in: 6 months with Dr McLean. (December 2013). You will receive a reminder letter in the mail two months in advance. If you don't receive a letter, please call our office to schedule the follow-up appointment.  

## 2011-08-30 NOTE — Assessment & Plan Note (Signed)
Mild to moderate AS by echo in April.  Continue to monitor.  Sister had AVR.

## 2011-08-30 NOTE — Assessment & Plan Note (Signed)
These have been present for years.  Not really worse after stopping metoprolol because of bradycardia after last appointment.  However, he had 1 episode of very severe palpitations associated with chest pain.  No significant arrhythmia on 3-week monitor.  However, he did not have a recurrence of the severe palpitations.  Will continue to monitor for now.  Will hold off on beta blocker with history of bradycardia.

## 2011-08-30 NOTE — Progress Notes (Signed)
Patient ID: Sean Brock, male   DOB: 1934-07-29, 77 y.o.   MRN: [id number] PCP: Dr. Manus Gunning  77 yo with history of HTN and aortic stenosis presents for cardiology followup.  He was seen by Lorin Picket a few weeks ago because of an episode of severe palpitations associated with chest pain.  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  He had a [place] that showed no ischemia or infarction.  Event monitor showed only PACs/PVCs.  He has had these with mild palpitations for many years.  Echo in 4/13 showed mild to moderate AS.    Since his appointment with Lorin Picket, he has had no recurrence of the chest pain/palpitations.  He feels good.  No exertional dyspnea or chest pain.    ECG: NSR at 55, normal PMH: 1. HTN 2. Hyperlipidemia 3. Vertigo 4. Aortic stenosis: Mild to moderate.  Echo (4/11) with EF 55-60%, mean gradient 18/peak 34.  Echo (4/13): EF 55-60%, mild to moderate AS, mean gradient 26/peak gradient 53.  5. Palpitations: Event monitor (5/13) with occasional PVCs, PACs but no significant arrhythmia.  6. Chest pain: Lexiscan myoview (5/13) with EF 70%, no ischemia or infarction.   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  Married.  Nonsmoker.   FH: Brother with CABG.  Sister with aortic stenosis s/p AVR   Current Outpatient Prescriptions  Medication Sig Dispense Refill  . amLODipine (NORVASC) 5 MG tablet Take 1 tablet (5 mg total) by mouth daily.      Marland Kitchen aspirin 81 MG tablet Take 81 mg by mouth daily.      . fish oil-omega-3 fatty acids 1000 MG capsule Take 2 g by mouth daily.      . rosuvastatin (CRESTOR) 10 MG tablet Take 10 mg by mouth daily.      . sildenafil (VIAGRA) 50 MG tablet Take 50 mg by mouth daily as needed.      . Tamsulosin HCl (FLOMAX) 0.4 MG CAPS Take by mouth.      . triamterene-hydrochlorothiazide (DYAZIDE) 37.5-25 MG per capsule Take 1 capsule by mouth every morning.         BP 142/70  Pulse  55  Ht 6' 2.5" (1.892 m)  Wt 93.895 kg (207 lb)  BMI 26.22 kg/m2 General: NAD Neck: No JVD, no thyromegaly or thyroid nodule.  Lungs: Clear to auscultation bilaterally with normal respiratory effort. CV: Nondisplaced PMI.  Heart regular S1/S2, no S3/S4, 3/6 mid-peaking systolic murmur, S2 is heard clearly.  No peripheral edema.  No carotid bruit.  Normal pedal pulses.  Abdomen: Soft, nontender, no hepatosplenomegaly, no distention.  Neurologic: Alert and oriented x 3.  Psych: Normal affect. Extremities: No clubbing or cyanosis.

## 2012-01-19 ENCOUNTER — Encounter: Payer: Self-pay | Admitting: Cardiology

## 2012-03-08 ENCOUNTER — Encounter: Payer: Self-pay | Admitting: Cardiology

## 2012-03-08 ENCOUNTER — Ambulatory Visit (INDEPENDENT_AMBULATORY_CARE_PROVIDER_SITE_OTHER): Payer: Medicare Other | Admitting: Cardiology

## 2012-03-08 VITALS — BP 152/86 | HR 57 | Ht 74.5 in | Wt 209.0 lb

## 2012-03-08 DIAGNOSIS — I350 Nonrheumatic aortic (valve) stenosis: Secondary | ICD-10-CM

## 2012-03-08 DIAGNOSIS — E785 Hyperlipidemia, unspecified: Secondary | ICD-10-CM

## 2012-03-08 DIAGNOSIS — I359 Nonrheumatic aortic valve disorder, unspecified: Secondary | ICD-10-CM

## 2012-03-08 DIAGNOSIS — R002 Palpitations: Secondary | ICD-10-CM

## 2012-03-08 DIAGNOSIS — I10 Essential (primary) hypertension: Secondary | ICD-10-CM

## 2012-03-08 MED ORDER — AMLODIPINE BESYLATE 10 MG PO TABS: 10.0000 mg | ORAL_TABLET | Freq: Every day | ORAL | Status: DC

## 2012-03-08 NOTE — Patient Instructions (Addendum)
Your physician has requested that you have an echocardiogram IN April 2014. Echocardiography is a painless test that uses sound waves to create images of your heart. It provides your doctor with information about the size and shape of your heart and how well your heart's chambers and valves are working. This procedure takes approximately one hour. There are no restrictions for this procedure.  Your physician has recommended you make the following change in your medication: INCREASE AMLODIPINE TO 10 MG DAILY  Your physician wants you to follow-up in: 1 YEAR  You will receive a reminder letter in the mail two months in advance. If you don't receive a letter, please call our office to schedule the follow-up appointment.   I CALLED YOUR PCP AND REQUESTED YOUR LAST LAB RESULTS.

## 2012-03-10 NOTE — Progress Notes (Signed)
Patient ID: Sean Brock, male   DOB: Sep 16, 1934, 78 y.o.   MRN: [id number] PCP: Dr. Manus Gunning  78 yo with history of HTN and aortic stenosis presents for cardiology followup.  Echo in 4/13 showed mild to moderate AS.  He retired 2 months ago and has found that his palpitations have significantly improved.  No chest pain and no exertional dyspnea.  He walks for exercise.  BP has been running high, SBP in 140s when he checks at home.  It is 152/86 today.      ECG: NSR, nonspecific T wave flattening anterolaterally.  PMH: 1. HTN 2. Hyperlipidemia 3. Vertigo 4. Aortic stenosis: Mild to moderate.  Echo (4/11) with EF 55-60%, mean gradient 18/peak 34.  Echo (4/13): EF 55-60%, mild to moderate AS, mean gradient 26/peak gradient 53.  5. Palpitations: Event monitor (5/13) with occasional PVCs, PACs but no significant arrhythmia.  6. Chest pain: Lexiscan myoview (5/13) with EF 70%, no ischemia or infarction.   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  Nonsmoker.   FH: Brother with CABG.  Sister with aortic stenosis s/p AVR  ROS: All systems reviewed and negative except as per HPI.   Current Outpatient Prescriptions  Medication Sig Dispense Refill  . amLODipine (NORVASC) 10 MG tablet Take 1 tablet (10 mg total) by mouth daily.  30 tablet  6  . aspirin 81 MG tablet Take 81 mg by mouth daily.      . fish oil-omega-3 fatty acids 1000 MG capsule Take 2 g by mouth daily.      . rosuvastatin (CRESTOR) 10 MG tablet Take 10 mg by mouth daily.      . sildenafil (VIAGRA) 50 MG tablet Take 50 mg by mouth daily as needed.      . Tamsulosin HCl (FLOMAX) 0.4 MG CAPS Take by mouth.      . triamterene-hydrochlorothiazide (DYAZIDE) 37.5-25 MG per capsule Take 1 capsule by mouth every morning.         BP 152/86  Pulse 57  Ht 6' 2.5" (1.892 m)  Wt 209 lb (94.802 kg)  BMI 26.48 kg/m2 General: NAD Neck: No JVD, no thyromegaly or thyroid nodule.  Lungs: Clear to  auscultation bilaterally with normal respiratory effort. CV: Nondisplaced PMI.  Heart regular S1/S2, no S3/S4, 2/6 mid-peaking systolic murmur, S2 is heard clearly.  No peripheral edema.  No carotid bruit.  Normal pedal pulses.  Abdomen: Soft, nontender, no hepatosplenomegaly, no distention.  Neurologic: Alert and oriented x 3.  Psych: Normal affect. Extremities: No clubbing or cyanosis.   Assessment/Plan:  AORTIC STENOSIS  Mild to moderate AS by last echo.  Repeat echo in 4/14.  PALPITATIONS Improved since he retired.   Hypertension Increase amlodipine to 10 mg daily as BP has been running high.    Will ask for copy of recent BMET and lipids from PCP.   Marca Ancona 03/10/2012

## 2012-05-27 ENCOUNTER — Observation Stay (HOSPITAL_COMMUNITY): Payer: Medicare Other

## 2012-05-27 ENCOUNTER — Encounter (HOSPITAL_COMMUNITY): Payer: Self-pay | Admitting: Emergency Medicine

## 2012-05-27 ENCOUNTER — Emergency Department (HOSPITAL_COMMUNITY): Payer: Medicare Other

## 2012-05-27 ENCOUNTER — Observation Stay (HOSPITAL_COMMUNITY)
Admission: EM | Admit: 2012-05-27 | Discharge: 2012-05-28 | Disposition: A | Discharge status: Home / Self Care | Payer: Medicare Other | Attending: Internal Medicine | Admitting: Internal Medicine

## 2012-05-27 DIAGNOSIS — I10 Essential (primary) hypertension: Secondary | ICD-10-CM | POA: Insufficient documentation

## 2012-05-27 DIAGNOSIS — I6350 Cerebral infarction due to unspecified occlusion or stenosis of unspecified cerebral artery: Secondary | ICD-10-CM

## 2012-05-27 DIAGNOSIS — I639 Cerebral infarction, unspecified: Secondary | ICD-10-CM

## 2012-05-27 DIAGNOSIS — R5381 Other malaise: Secondary | ICD-10-CM | POA: Insufficient documentation

## 2012-05-27 DIAGNOSIS — R29810 Facial weakness: Principal | ICD-10-CM | POA: Insufficient documentation

## 2012-05-27 DIAGNOSIS — R001 Bradycardia, unspecified: Secondary | ICD-10-CM

## 2012-05-27 DIAGNOSIS — E785 Hyperlipidemia, unspecified: Secondary | ICD-10-CM

## 2012-05-27 DIAGNOSIS — I2510 Atherosclerotic heart disease of native coronary artery without angina pectoris: Secondary | ICD-10-CM | POA: Insufficient documentation

## 2012-05-27 DIAGNOSIS — I359 Nonrheumatic aortic valve disorder, unspecified: Secondary | ICD-10-CM | POA: Insufficient documentation

## 2012-05-27 DIAGNOSIS — I7389 Other specified peripheral vascular diseases: Secondary | ICD-10-CM | POA: Insufficient documentation

## 2012-05-27 DIAGNOSIS — I498 Other specified cardiac arrhythmias: Secondary | ICD-10-CM

## 2012-05-27 DIAGNOSIS — R5383 Other fatigue: Secondary | ICD-10-CM | POA: Insufficient documentation

## 2012-05-27 HISTORY — DX: Disorder of trigeminal nerve, unspecified: G50.9

## 2012-05-27 HISTORY — DX: Gastro-esophageal reflux disease without esophagitis: K21.9

## 2012-05-27 HISTORY — DX: Unspecified osteoarthritis, unspecified site: M19.90

## 2012-05-27 LAB — COMPREHENSIVE METABOLIC PANEL
ALT: 12 U/L (ref 0–53)
AST: 14 U/L (ref 0–37)
Albumin: 4 g/dL (ref 3.5–5.2)
Alkaline Phosphatase: 41 U/L (ref 39–117)
Calcium: 9.3 mg/dL (ref 8.4–10.5)
Glucose, Bld: 97 mg/dL (ref 70–99)
Potassium: 3.4 mEq/L — ABNORMAL LOW (ref 3.5–5.1)
Sodium: 140 mEq/L (ref 135–145)
Total Protein: 7.1 g/dL (ref 6.0–8.3)

## 2012-05-27 LAB — POCT I-STAT, CHEM 8
BUN: 22 mg/dL (ref 6–23)
Calcium, Ion: 1.14 mmol/L (ref 1.13–1.30)
Chloride: 104 mEq/L (ref 96–112)
Glucose, Bld: 96 mg/dL (ref 70–99)
HCT: 42 % (ref 39.0–52.0)
Potassium: 3.5 mEq/L (ref 3.5–5.1)

## 2012-05-27 LAB — RAPID URINE DRUG SCREEN, HOSP PERFORMED
Barbiturates: NOT DETECTED
Benzodiazepines: NOT DETECTED
Cocaine: NOT DETECTED

## 2012-05-27 LAB — URINALYSIS, ROUTINE W REFLEX MICROSCOPIC
Bilirubin Urine: NEGATIVE
Glucose, UA: NEGATIVE mg/dL
Ketones, ur: NEGATIVE mg/dL
Leukocytes, UA: NEGATIVE
Nitrite: NEGATIVE
Specific Gravity, Urine: 1.019 (ref 1.005–1.030)
pH: 6 (ref 5.0–8.0)

## 2012-05-27 LAB — CBC WITH DIFFERENTIAL/PLATELET
Basophils Absolute: 0.1 10*3/uL (ref 0.0–0.1)
Basophils Relative: 1 % (ref 0–1)
Eosinophils Absolute: 0.1 10*3/uL (ref 0.0–0.7)
Eosinophils Relative: 2 % (ref 0–5)
Lymphs Abs: 2.1 10*3/uL (ref 0.7–4.0)
MCH: 30 pg (ref 26.0–34.0)
Neutrophils Relative %: 55 % (ref 43–77)
Platelets: 210 10*3/uL (ref 150–400)
RBC: 4.66 MIL/uL (ref 4.22–5.81)
RDW: 13.2 % (ref 11.5–15.5)

## 2012-05-27 LAB — POCT I-STAT TROPONIN I: Troponin i, poc: 0 ng/mL (ref 0.00–0.08)

## 2012-05-27 LAB — APTT: aPTT: 34 seconds (ref 24–37)

## 2012-05-27 LAB — PROTIME-INR: INR: 1.02 (ref 0.00–1.49)

## 2012-05-27 LAB — TROPONIN I: Troponin I: <0.3 ng/mL (ref <0.30)

## 2012-05-27 MED ORDER — ATORVASTATIN CALCIUM 20 MG PO TABS
20.0000 mg | ORAL_TABLET | Freq: Every day | ORAL | Status: DC
  Filled 2012-05-27: qty 1

## 2012-05-27 MED ORDER — CLOPIDOGREL BISULFATE 75 MG PO TABS
75.0000 mg | ORAL_TABLET | Freq: Every day | ORAL | Status: DC
  Administered 2012-05-28: 75 mg via ORAL
  Filled 2012-05-27: qty 1

## 2012-05-27 MED ORDER — ROSUVASTATIN CALCIUM 10 MG PO TABS
10.0000 mg | ORAL_TABLET | Freq: Every day | ORAL | Status: DC
  Administered 2012-05-27: 10 mg via ORAL
  Filled 2012-05-27 (×2): qty 1

## 2012-05-27 MED ORDER — PANTOPRAZOLE SODIUM 40 MG PO TBEC
40.0000 mg | DELAYED_RELEASE_TABLET | Freq: Every day | ORAL | Status: DC
  Administered 2012-05-27 – 2012-05-28 (×2): 40 mg via ORAL
  Filled 2012-05-27 (×2): qty 1

## 2012-05-27 MED ORDER — OMEGA-3-ACID ETHYL ESTERS 1 G PO CAPS
2.0000 g | ORAL_CAPSULE | Freq: Every day | ORAL | Status: DC
  Administered 2012-05-27 – 2012-05-28 (×2): 2 g via ORAL
  Filled 2012-05-27 (×2): qty 2

## 2012-05-27 MED ORDER — OMEGA-3 FATTY ACIDS 1000 MG PO CAPS: 2.0000 g | ORAL_CAPSULE | Freq: Every day | ORAL | Status: DC

## 2012-05-27 MED ORDER — CLOPIDOGREL BISULFATE 75 MG PO TABS: 75.0000 mg | ORAL_TABLET | Freq: Every day | ORAL | Status: DC

## 2012-05-27 MED ORDER — ASPIRIN 325 MG PO TABS
325.0000 mg | ORAL_TABLET | Freq: Every day | ORAL | Status: DC
  Administered 2012-05-27: 325 mg via ORAL
  Filled 2012-05-27: qty 1

## 2012-05-27 MED ORDER — SENNOSIDES-DOCUSATE SODIUM 8.6-50 MG PO TABS
1.0000 | ORAL_TABLET | Freq: Every evening | ORAL | Status: DC | PRN
  Filled 2012-05-27: qty 1

## 2012-05-27 MED ORDER — NON FORMULARY: 10.0000 mg | Freq: Every day | Status: DC

## 2012-05-27 MED ORDER — ENOXAPARIN SODIUM 40 MG/0.4ML ~~LOC~~ SOLN
40.0000 mg | SUBCUTANEOUS | Status: DC
  Administered 2012-05-27: 40 mg via SUBCUTANEOUS
  Filled 2012-05-27 (×2): qty 0.4

## 2012-05-27 MED ORDER — TAMSULOSIN HCL 0.4 MG PO CAPS
0.4000 mg | ORAL_CAPSULE | Freq: Every day | ORAL | Status: DC
  Administered 2012-05-27: 0.4 mg via ORAL
  Filled 2012-05-27 (×2): qty 1

## 2012-05-27 NOTE — ED Notes (Signed)
PT HAS ARRIVED IN POD C FROM MRI.

## 2012-05-27 NOTE — ED Notes (Addendum)
Onset today woke up at 0630 and then saw wife at 0800 and she saw patient right side facial droop and slurred speech. Patient woke up at 0630 and had general weakness and unsteady gait. History of vertigo. Currently ax4 answering and following commands appropriate.

## 2012-05-27 NOTE — ED Provider Notes (Signed)
History     CSN: [id number]  Arrival date & time 05/27/12  1038   First MD Initiated Contact with Patient 05/27/12 1113      Chief Complaint  Patient presents with  . Cerebrovascular Accident    (Consider location/radiation/quality/duration/timing/severity/associated sxs/prior treatment) HPI Comments: Sean Brock is a 78 y.o. Male whose wife noticed that he had a facial droop on the left, at 8 AM this morning. Prior to that, he had been feeling weak all morning since he woke up. His wife have not seen him, until 8:00 AM. The patient did not realize that he had a deficit. He denies headache. Patient was seen at 11:15- at that time, it was felt that he was not a candidate for code stroke, based on an ill-defined time of onset. The patient came here, by private vehicle.  He denies headache, fever, chills, cough, shortness of breath, chest pain, or dizziness. There are no known modifying factors.  Patient is a 78 y.o. male presenting with Acute Neurological Problem. The history is provided by the patient.  Cerebrovascular Accident    Past Medical History  Diagnosis Date  . HYPERLIPIDEMIA   . HYPERTENSION   . AORTIC STENOSIS     Mild to moderate. Echo (4/11) with EF 55-60%, mean gradient 18/peak 34;  echo 4/13: severe LVH, EF 55-60%, grade 1 diast dysfxn, mean 26/peak 53 c/w mild to mod AS  . Former smoker   . DYSPEPSIA   . Palpitations   . CAD (coronary artery disease)     LHC 11/1982:  mLAD 40%, pRCA 90%, dRCA 70% diffuse,  PDA occluded, normal LVF - treated medically    History reviewed. No pertinent past surgical history.  Family History  Problem Relation Age of Onset  . Diabetes    . Hypertension    . Lung cancer      History  Substance Use Topics  . Smoking status: Former Smoker -- 1.00 packs/day for 30 years    Types: Cigarettes    Quit date: 08/29/2009  . Smokeless tobacco: Never Used  . Alcohol Use: No      Review of Systems  All other systems reviewed  and are negative.    Allergies  Sudafed and Cardizem  Home Medications   Current Outpatient Rx  Name  Route  Sig  Dispense  Refill  . amLODipine (NORVASC) 10 MG tablet   Oral   Take 1 tablet (10 mg total) by mouth daily.   30 tablet   6     MED INCREASED, MAY HAVE 90 AT A TIME   . aspirin 81 MG tablet   Oral   Take 81 mg by mouth daily.         Marland Kitchen dextran 70-hypromellose (TEARS RENEWED) ophthalmic solution   Both Eyes   Place into both eyes daily as needed (DRY EYES).         . fish oil-omega-3 fatty acids 1000 MG capsule   Oral   Take 2 g by mouth daily.         Marland Kitchen omeprazole (PRILOSEC) 20 MG capsule   Oral   Take 20 mg by mouth daily.         . rosuvastatin (CRESTOR) 10 MG tablet   Oral   Take 10 mg by mouth daily.         . Tamsulosin HCl (FLOMAX) 0.4 MG CAPS   Oral   Take 0.4 mg by mouth at bedtime.          Marland Kitchen  triamterene-hydrochlorothiazide (DYAZIDE) 37.5-25 MG per capsule   Oral   Take 1 capsule by mouth every morning.           BP 140/68  Pulse 55  Temp(Src) 97.7 F (36.5 C) (Oral)  Resp 10  SpO2 97%  Physical Exam  Nursing note and vitals reviewed. Constitutional: He is oriented to person, place, and time. He appears well-developed and well-nourished.  HENT:  Head: Normocephalic and atraumatic.  Right Ear: External ear normal.  Left Ear: External ear normal.  Eyes: Conjunctivae and EOM are normal. Pupils are equal, round, and reactive to light.  Neck: Normal range of motion and phonation normal. Neck supple.  Cardiovascular: Normal rate, regular rhythm, normal heart sounds and intact distal pulses.   Pulmonary/Chest: Effort normal and breath sounds normal. He exhibits no bony tenderness.  Abdominal: Soft. Normal appearance. There is no tenderness.  Musculoskeletal: Normal range of motion.  Normal strength in arms and legs, bilaterally  Neurological: He is alert and oriented to person, place, and time. He has normal strength. No  cranial nerve deficit or sensory deficit. He exhibits normal muscle tone. Coordination normal.  Subtle left facial droop. With forced smiling, he can elevate the left facial muscles. No deficit with puffed cheek maneuver. Normal finger-to-nose, heel-to-shin, bilaterally  Skin: Skin is warm, dry and intact.  Psychiatric: He has a normal mood and affect. His behavior is normal. Judgment and thought content normal.    ED Course  Procedures (including critical care time)  13:05- wife is now here and states that his face looks the same as when she noticed it at 8 AM today. He is otherwise, at his baseline.  MR ordered to further assess for acute CVA.    Date: 12/22/2011  Rate: 56  Rhythm: normal sinus rhythm  QRS Axis: normal  PR and QT Intervals: normal  ST/T Wave abnormalities: nonspecific ST/T changes  PR and QRS Conduction Disutrbances:none  Narrative Interpretation:   Old EKG Reviewed: unchanged  13:50- Discussed with the on-call hospitalist, to arrange an admission. I wrote holding orders.  Labs Reviewed  COMPREHENSIVE METABOLIC PANEL - Abnormal; Notable for the following:    Potassium 3.4 (*)    GFR calc non Af Amer 59 (*)    GFR calc Af Amer 68 (*)    All other components within normal limits  CBC WITH DIFFERENTIAL  ETHANOL  PROTIME-INR  APTT  TROPONIN I  URINE RAPID DRUG SCREEN (HOSP PERFORMED)  URINALYSIS, ROUTINE W REFLEX MICROSCOPIC  GLUCOSE, CAPILLARY  POCT I-STAT, CHEM 8  POCT I-STAT TROPONIN I   Dg Chest 2 View  05/27/2012  *RADIOLOGY REPORT*  Clinical Data: Left facial droop.  CHEST - 2 VIEW  Comparison: 02/12/2004  Findings: The lungs are clear without focal infiltrate, edema, pneumothorax or pleural effusion. Hyperexpansion is consistent with emphysema. The cardiopericardial silhouette is within normal limits for size. Imaged bony structures of the thorax are intact.  IMPRESSION: No acute cardiopulmonary findings.   Original Report Authenticated By: Kennith Center, M.D.    Ct Head Wo Contrast  05/27/2012  *RADIOLOGY REPORT*  Clinical Data: Stroke, right facial droop, slurred speech, generalized weakness, unsteady gait, history hypertension, hyperlipidemia, smoking, coronary artery disease  CT HEAD WITHOUT CONTRAST  Technique:  Contiguous axial images were obtained from the base of the skull through the vertex without contrast.  Comparison: 11/03/2009  Findings: Generalized atrophy. Normal ventricular morphology. No midline shift or mass effect. Scattered streak artifacts. Question tiny remote infarct right caudate head.  No intracranial hemorrhage, mass lesion, or evidence of acute infarction. No extra-axial fluid collections. Mucosal retention cyst right sphenoid sinus. Bones and sinuses otherwise unremarkable.  IMPRESSION: No acute intracranial abnormalities. Questionable tiny remote lacunar infarct right caudate head.   Original Report Authenticated By: Ulyses Southward, M.D.      1. CVA (cerebral infarction)       MDM  Evaluation is consistent with small right brain stroke. Suspect secondary to small vessel disease and likely represents a lacunar infarct. The  lacunar infarct noted on CT scan likely represents a prior, unrecognized, CVA. The patient is bit better for further evaluation and risk stratification; with consideration for rehabilitation protocol.    Plan: Admit    Flint Melter, MD 05/27/12 [phone]

## 2012-05-27 NOTE — ED Notes (Signed)
Vitals taken by Eli, EMT 

## 2012-05-27 NOTE — Consult Note (Signed)
Referring Physician: Blake Divine    Chief Complaint: Facial droop  HPI:                                                                                                                                         Sean Brock is an 78 y.o. male who takes a ASA daily and went to sleep at 10:30 last night normal.  Upon waking this AM his wife noted a left facial droop and patient noted he may have been slurring his words slightly.  They did not initially go to the hospital, but after patient noted his face was asymmetrical he was brought by private car to hospital.  Initial CT head was negative but follow up MRI did show a acute right parietal infarct.   Date last known well: 04/28/12 Time last known well: 10:30 pm tPA Given: No: out of window NIHSS: 1  Past Medical History  Diagnosis Date  . HYPERLIPIDEMIA   . HYPERTENSION   . AORTIC STENOSIS     Mild to moderate. Echo (4/11) with EF 55-60%, mean gradient 18/peak 34;  echo 4/13: severe LVH, EF 55-60%, grade 1 diast dysfxn, mean 26/peak 53 c/w mild to mod AS  . Former smoker   . DYSPEPSIA   . Palpitations   . CAD (coronary artery disease)     LHC 11/1982:  mLAD 40%, pRCA 90%, dRCA 70% diffuse,  PDA occluded, normal LVF - treated medically    History reviewed. No pertinent past surgical history.  Family History  Problem Relation Age of Onset  . Diabetes    . Hypertension    . Lung cancer     Social History:  reports that he quit smoking about 2 years ago. His smoking use included Cigarettes. He has a 30 pack-year smoking history. He has never used smokeless tobacco. He reports that he does not drink alcohol. His drug history is not on file.  Allergies:  Allergies  Allergen Reactions  . Sudafed (Pseudoephedrine Hcl) Palpitations  . Cardizem (Diltiazem Hcl) Rash    Medications:                                                                                                                           Current Facility-Administered  Medications  Medication Dose Route Frequency Provider Last Rate Last Dose  . aspirin tablet 325 mg  325  mg Oral Daily Kathlen Mody, MD   325 mg at 05/27/12 1426   Current Outpatient Prescriptions  Medication Sig Dispense Refill  . amLODipine (NORVASC) 10 MG tablet Take 1 tablet (10 mg total) by mouth daily.  30 tablet  6  . aspirin 81 MG tablet Take 81 mg by mouth daily.      Marland Kitchen dextran 70-hypromellose (TEARS RENEWED) ophthalmic solution Place into both eyes daily as needed (DRY EYES).      . fish oil-omega-3 fatty acids 1000 MG capsule Take 2 g by mouth daily.      Marland Kitchen omeprazole (PRILOSEC) 20 MG capsule Take 20 mg by mouth daily.      . rosuvastatin (CRESTOR) 10 MG tablet Take 10 mg by mouth daily.      . Tamsulosin HCl (FLOMAX) 0.4 MG CAPS Take 0.4 mg by mouth at bedtime.       . triamterene-hydrochlorothiazide (DYAZIDE) 37.5-25 MG per capsule Take 1 capsule by mouth every morning.         ROS:                                                                                                                                       History obtained from the patient  General ROS: negative for - chills, fatigue, fever, night sweats, weight gain or weight loss Psychological ROS: negative for - behavioral disorder, hallucinations, memory difficulties, mood swings or suicidal ideation Ophthalmic ROS: negative for - blurry vision, double vision, eye pain or loss of vision ENT ROS: negative for - epistaxis, nasal discharge, oral lesions, sore throat, tinnitus or vertigo Allergy and Immunology ROS: negative for - hives or itchy/watery eyes Hematological and Lymphatic ROS: negative for - bleeding problems, bruising or swollen lymph nodes Endocrine ROS: negative for - galactorrhea, hair pattern changes, polydipsia/polyuria or temperature intolerance Respiratory ROS: negative for - cough, hemoptysis, shortness of breath or wheezing Cardiovascular ROS: negative for - chest pain, dyspnea on exertion, edema  or irregular heartbeat Gastrointestinal ROS: negative for - abdominal pain, diarrhea, hematemesis, nausea/vomiting or stool incontinence Genito-Urinary ROS: negative for - dysuria, hematuria, incontinence or urinary frequency/urgency Musculoskeletal ROS: negative for - joint swelling or muscular weakness Neurological ROS: as noted in HPI Dermatological ROS: negative for rash and skin lesion changes  Neurologic Examination:                                                                                                      Blood pressure 146/70, pulse  57, temperature 97.7 F (36.5 C), temperature source Oral, resp. rate 16, SpO2 96.00%.  Mental Status: Alert, oriented, thought content appropriate.  Speech fluent without evidence of aphasia.  Able to follow 3 step commands without difficulty. Cranial Nerves: II: Discs flat bilaterally; Visual fields grossly normal, pupils equal, round, reactive to light and accommodation III,IV, VI: ptosis not present, extra-ocular motions intact bilaterally V,VII: smile asymmetric on the right, facial light touch sensation normal bilaterally VIII: hearing normal bilaterally IX,X: gag reflex present XI: bilateral shoulder shrug XII: midline tongue extension Motor: Right : Upper extremity   5/5    Left:     Upper extremity   5/5  Lower extremity   5/5     Lower extremity   5/5 + orbital sign, right around left.  Tone and bulk:normal tone throughout; no atrophy noted Sensory: Pinprick and light touch intact throughout, bilaterally Deep Tendon Reflexes: 2+ and symmetric throughout Plantars: Right: downgoing   Left: downgoing Cerebellar: normal finger-to-nose,  normal heel-to-shin test CV: pulses palpable throughout    Results for orders placed during the hospital encounter of 05/27/12 (from the past 48 hour(s))  CBC WITH DIFFERENTIAL     Status: None   Collection Time    05/27/12 11:04 AM      Result Value Range   WBC 6.2  4.0 - 10.5 K/uL   RBC  4.66  4.22 - 5.81 MIL/uL   Hemoglobin 14.0  13.0 - 17.0 g/dL   HCT 16.1  09.6 - 04.5 %   MCV 84.1  78.0 - 100.0 fL   MCH 30.0  26.0 - 34.0 pg   MCHC 35.7  30.0 - 36.0 g/dL   RDW 40.9  81.1 - 91.4 %   Platelets 210  150 - 400 K/uL   Neutrophils Relative 55  43 - 77 %   Neutro Abs 3.4  1.7 - 7.7 K/uL   Lymphocytes Relative 33  12 - 46 %   Lymphs Abs 2.1  0.7 - 4.0 K/uL   Monocytes Relative 9  3 - 12 %   Monocytes Absolute 0.5  0.1 - 1.0 K/uL   Eosinophils Relative 2  0 - 5 %   Eosinophils Absolute 0.1  0.0 - 0.7 K/uL   Basophils Relative 1  0 - 1 %   Basophils Absolute 0.1  0.0 - 0.1 K/uL  COMPREHENSIVE METABOLIC PANEL     Status: Abnormal   Collection Time    05/27/12 11:04 AM      Result Value Range   Sodium 140  135 - 145 mEq/L   Potassium 3.4 (*) 3.5 - 5.1 mEq/L   Chloride 102  96 - 112 mEq/L   CO2 26  19 - 32 mEq/L   Glucose, Bld 97  70 - 99 mg/dL   BUN 21  6 - 23 mg/dL   Creatinine, Ser 7.82  0.50 - 1.35 mg/dL   Calcium 9.3  8.4 - 95.6 mg/dL   Total Protein 7.1  6.0 - 8.3 g/dL   Albumin 4.0  3.5 - 5.2 g/dL   AST 14  0 - 37 U/L   ALT 12  0 - 53 U/L   Alkaline Phosphatase 41  39 - 117 U/L   Total Bilirubin 0.7  0.3 - 1.2 mg/dL   GFR calc non Af Amer 59 (*) >90 mL/min   GFR calc Af Amer 68 (*) >90 mL/min   Comment:            The  eGFR has been calculated     using the CKD EPI equation.     This calculation has not been     validated in all clinical     situations.     eGFR's persistently     <90 mL/min signify     possible Chronic Kidney Disease.  ETHANOL     Status: None   Collection Time    05/27/12 11:20 AM      Result Value Range   Alcohol, Ethyl (B) <11  0 - 11 mg/dL   Comment:            LOWEST DETECTABLE LIMIT FOR     SERUM ALCOHOL IS 11 mg/dL     FOR MEDICAL PURPOSES ONLY  PROTIME-INR     Status: None   Collection Time    05/27/12 11:20 AM      Result Value Range   Prothrombin Time 13.3  11.6 - 15.2 seconds   INR 1.02  0.00 - 1.49  APTT      Status: None   Collection Time    05/27/12 11:20 AM      Result Value Range   aPTT 34  24 - 37 seconds  TROPONIN I     Status: None   Collection Time    05/27/12 11:20 AM      Result Value Range   Troponin I <0.30  <0.30 ng/mL   Comment:            Due to the release kinetics of cTnI,     a negative result within the first hours     of the onset of symptoms does not rule out     myocardial infarction with certainty.     If myocardial infarction is still suspected,     repeat the test at appropriate intervals.  POCT I-STAT TROPONIN I     Status: None   Collection Time    05/27/12 11:38 AM      Result Value Range   Troponin i, poc 0.00  0.00 - 0.08 ng/mL   Comment 3            Comment: Due to the release kinetics of cTnI,     a negative result within the first hours     of the onset of symptoms does not rule out     myocardial infarction with certainty.     If myocardial infarction is still suspected,     repeat the test at appropriate intervals.  POCT I-STAT, CHEM 8     Status: None   Collection Time    05/27/12 11:40 AM      Result Value Range   Sodium 142  135 - 145 mEq/L   Potassium 3.5  3.5 - 5.1 mEq/L   Chloride 104  96 - 112 mEq/L   BUN 22  6 - 23 mg/dL   Creatinine, Ser 1.61  0.50 - 1.35 mg/dL   Glucose, Bld 96  70 - 99 mg/dL   Calcium, Ion 0.96  0.45 - 1.30 mmol/L   TCO2 28  0 - 100 mmol/L   Hemoglobin 14.3  13.0 - 17.0 g/dL   HCT 40.9  81.1 - 91.4 %  URINE RAPID DRUG SCREEN (HOSP PERFORMED)     Status: None   Collection Time    05/27/12 12:11 PM      Result Value Range   Opiates NONE DETECTED  NONE DETECTED   Cocaine NONE DETECTED  NONE DETECTED  Benzodiazepines NONE DETECTED  NONE DETECTED   Amphetamines NONE DETECTED  NONE DETECTED   Tetrahydrocannabinol NONE DETECTED  NONE DETECTED   Barbiturates NONE DETECTED  NONE DETECTED   Comment:            DRUG SCREEN FOR MEDICAL PURPOSES     ONLY.  IF CONFIRMATION IS NEEDED     FOR ANY PURPOSE, NOTIFY LAB      WITHIN 5 DAYS.                LOWEST DETECTABLE LIMITS     FOR URINE DRUG SCREEN     Drug Class       Cutoff (ng/mL)     Amphetamine      1000     Barbiturate      200     Benzodiazepine   200     Tricyclics       300     Opiates          300     Cocaine          300     THC              50  URINALYSIS, ROUTINE W REFLEX MICROSCOPIC     Status: None   Collection Time    05/27/12 12:11 PM      Result Value Range   Color, Urine YELLOW  YELLOW   APPearance CLEAR  CLEAR   Specific Gravity, Urine 1.019  1.005 - 1.030   pH 6.0  5.0 - 8.0   Glucose, UA NEGATIVE  NEGATIVE mg/dL   Hgb urine dipstick NEGATIVE  NEGATIVE   Bilirubin Urine NEGATIVE  NEGATIVE   Ketones, ur NEGATIVE  NEGATIVE mg/dL   Protein, ur NEGATIVE  NEGATIVE mg/dL   Urobilinogen, UA 1.0  0.0 - 1.0 mg/dL   Nitrite NEGATIVE  NEGATIVE   Leukocytes, UA NEGATIVE  NEGATIVE   Comment: MICROSCOPIC NOT DONE ON URINES WITH NEGATIVE PROTEIN, BLOOD, LEUKOCYTES, NITRITE, OR GLUCOSE <1000 mg/dL.  GLUCOSE, CAPILLARY     Status: None   Collection Time    05/27/12 12:15 PM      Result Value Range   Glucose-Capillary 98  70 - 99 mg/dL   Dg Chest 2 View  06/12/8117  *RADIOLOGY REPORT*  Clinical Data: Left facial droop.  CHEST - 2 VIEW  Comparison: 02/12/2004  Findings: The lungs are clear without focal infiltrate, edema, pneumothorax or pleural effusion. Hyperexpansion is consistent with emphysema. The cardiopericardial silhouette is within normal limits for size. Imaged bony structures of the thorax are intact.  IMPRESSION: No acute cardiopulmonary findings.   Original Report Authenticated By: Kennith Center, M.D.    Ct Head Wo Contrast  05/27/2012  *RADIOLOGY REPORT*  Clinical Data: Stroke, right facial droop, slurred speech, generalized weakness, unsteady gait, history hypertension, hyperlipidemia, smoking, coronary artery disease  CT HEAD WITHOUT CONTRAST  Technique:  Contiguous axial images were obtained from the base of the skull  through the vertex without contrast.  Comparison: 11/03/2009  Findings: Generalized atrophy. Normal ventricular morphology. No midline shift or mass effect. Scattered streak artifacts. Question tiny remote infarct right caudate head. No intracranial hemorrhage, mass lesion, or evidence of acute infarction. No extra-axial fluid collections. Mucosal retention cyst right sphenoid sinus. Bones and sinuses otherwise unremarkable.  IMPRESSION: No acute intracranial abnormalities. Questionable tiny remote lacunar infarct right caudate head.   Original Report Authenticated By: Ulyses Southward, M.D.     Assessment and plan discussed with  with attending physician and they are in agreement.    Felicie Morn PA-C [facility] [phone]  05/27/2012, 2:58 PM  I have seen and evaluated the patient. I have reviewed the above note and made appropriate changes.    Assessment: 78 y.o. male with 2 acute right parietal white matter infarct.  Patient was on ASA daily prior to infarct and states he has not missed a dose.  Currently exam shows left facial NL decrease otherwise no other neurological abnormalities. Patient will be admitted to hospital for further stroke workup and be followed by Stroke MD in the morning.   Stroke Risk Factors - hyperlipidemia and hypertension  Plan: 1. HgbA1c, fasting lipid panel 2. PT consult, OT consult, Speech consult 3. Echocardiogram 4. Carotid dopplers 5. Prophylactic therapy-Antiplatelet med: Plavix - dose 75 mg daily 6. Risk factor modification 7. Telemetry monitoring 8. Frequent neuro checks  Ritta Slot, MD [facility] [phone]  If 7pm- 7am, please page neurology on call at [phone].

## 2012-05-27 NOTE — ED Notes (Signed)
MD at bedside. 

## 2012-05-27 NOTE — ED Notes (Signed)
PATIENT AMBULATORY TO RESTROOM. GAIT IS STEADY

## 2012-05-27 NOTE — ED Notes (Signed)
Called flow manager to check on delay in room assignment. Per flow manager patient is going to 3 w. A bed just became available

## 2012-05-27 NOTE — H&P (Signed)
[facility] History and Physical  Sean Brock AVW:[id number] DOB: Nov 11, 1934 DOA: 05/27/2012  Referring physician: Mignon Brock PCP: Sean Lance, MD  Specialists: Sean Brock ( cardiology)  Chief Complaint: left facial droop.   HPI: [name] is a 78 y.o. male with h/o hypertension , CAD, aortic stenosis, gerd, came in for left facial droop noticed by his wife in the morning. He also reports weak getting out of bed. He denies any other complaints. Initial CT head showed tiny lacunar infarct. He is admitted to medical service for evaluation of CVA.    Review of Systems: The patient denies anorexia, fever, weight loss,, vision loss, decreased hearing, hoarseness, chest pain, syncope, dyspnea on exertion, peripheral edema, balance deficits, hemoptysis, abdominal pain, melena, hematochezia, severe indigestion/heartburn, hematuria, incontinence, genital sores, muscle weakness, suspicious skin lesions, transient blindness, difficulty walking, depression, unusual weight change, abnormal bleeding, enlarged lymph nodes, angioedema, and breast masses.    Past Medical History  Diagnosis Date  . HYPERLIPIDEMIA   . HYPERTENSION   . AORTIC STENOSIS     Mild to moderate. Echo (4/11) with EF 55-60%, mean gradient 18/peak 34;  echo 4/13: severe LVH, EF 55-60%, grade 1 diast dysfxn, mean 26/peak 53 c/w mild to mod AS  . Former smoker   . DYSPEPSIA   . Palpitations   . CAD (coronary artery disease)     LHC 11/1982:  mLAD 40%, pRCA 90%, dRCA 70% diffuse,  PDA occluded, normal LVF - treated medically   History reviewed. No pertinent past surgical history. Social History:  reports that he quit smoking about 2 years ago. His smoking use included Cigarettes. He has a 30 pack-year smoking history. He has never used smokeless tobacco. He reports that he does not drink alcohol. His drug history is not on file.  where does patient live--home,  Allergies  Allergen Reactions  . Sudafed  (Pseudoephedrine Hcl) Palpitations  . Cardizem (Diltiazem Hcl) Rash    Family History  Problem Relation Age of Onset  . Diabetes    . Hypertension    . Lung cancer     Prior to Admission medications   Medication Sig Start Date End Date Taking? Authorizing Provider  amLODipine (NORVASC) 10 MG tablet Take 1 tablet (10 mg total) by mouth daily. 03/08/12  Yes Sean Morale, MD  aspirin 81 MG tablet Take 81 mg by mouth daily.   Yes Historical Provider, MD  dextran 70-hypromellose (TEARS RENEWED) ophthalmic solution Place into both eyes daily as needed (DRY EYES).   Yes Historical Provider, MD  fish oil-omega-3 fatty acids 1000 MG capsule Take 2 g by mouth daily.   Yes Historical Provider, MD  omeprazole (PRILOSEC) 20 MG capsule Take 20 mg by mouth daily.   Yes Historical Provider, MD  rosuvastatin (CRESTOR) 10 MG tablet Take 10 mg by mouth daily.   Yes Historical Provider, MD  Tamsulosin HCl (FLOMAX) 0.4 MG CAPS Take 0.4 mg by mouth at bedtime.    Yes Historical Provider, MD  triamterene-hydrochlorothiazide (DYAZIDE) 37.5-25 MG per capsule Take 1 capsule by mouth every morning.   Yes Historical Provider, MD   Physical Exam: Filed Vitals:   05/27/12 1300 05/27/12 1315 05/27/12 1330 05/27/12 1345  BP: 123/69 133/67 127/73 140/68  Pulse: 53 55 56 55  Temp:      TempSrc:      Resp:      SpO2: 98% 95% 96% 97%    Constitutional: Vital signs reviewed.  Patient is a well-developed and well-nourished  in no acute distress and cooperative with exam. Alert and oriented x3.  Head: Normocephalic and atraumatic Ear: TM normal bilaterally Mouth: no erythema or exudates, MMM Eyes: PERRL, EOMI, conjunctivae normal, No scleral icterus.  Neck: Supple, Trachea midline normal ROM, No JVD, mass, thyromegaly, or carotid bruit present.  Cardiovascular: bradycardic, SM at the sternal border radiating to neck, pulses symmetric and intact bilaterally Pulmonary/Chest: CTAB, no wheezes, rales, or  rhonchi Abdominal: Soft. Non-tender, non-distended, bowel sounds are normal, no masses, organomegaly, or guarding present.  GU: no CVA tenderness Musculoskeletal: No joint deformities, erythema, or stiffness, ROM full and no nontender Hematology: no cervical, inginal, or axillary adenopathy.  Neurological: A&O x3, Strength is normal and symmetric bilaterally,slight facial droop, no focal motor deficit, sensory intact to light touch bilaterally.  Skin: Warm, dry and intact. No rash, cyanosis, or clubbing.  Psychiatric: Normal mood and affect. speech and behavior is normal.    Labs on Admission:  Basic Metabolic Panel:  Recent Labs Lab 05/27/12 1104 05/27/12 1140  NA 140 142  K 3.4* 3.5  CL 102 104  CO2 26  --   GLUCOSE 97 96  BUN 21 22  CREATININE 1.17 1.20  CALCIUM 9.3  --    Liver Function Tests:  Recent Labs Lab 05/27/12 1104  AST 14  ALT 12  ALKPHOS 41  BILITOT 0.7  PROT 7.1  ALBUMIN 4.0   No results found for this basename: LIPASE, AMYLASE,  in the last 168 hours No results found for this basename: AMMONIA,  in the last 168 hours CBC:  Recent Labs Lab 05/27/12 1104 05/27/12 1140  WBC 6.2  --   NEUTROABS 3.4  --   HGB 14.0 14.3  HCT 39.2 42.0  MCV 84.1  --   PLT 210  --    Cardiac Enzymes:  Recent Labs Lab 05/27/12 1120  TROPONINI <0.30    BNP (last 3 results) No results found for this basename: PROBNP,  in the last 8760 hours CBG:  Recent Labs Lab 05/27/12 1215  GLUCAP 98    Radiological Exams on Admission: Dg Chest 2 View  05/27/2012  *RADIOLOGY REPORT*  Clinical Data: Left facial droop.  CHEST - 2 VIEW  Comparison: 02/12/2004  Findings: The lungs are clear without focal infiltrate, edema, pneumothorax or pleural effusion. Hyperexpansion is consistent with emphysema. The cardiopericardial silhouette is within normal limits for size. Imaged bony structures of the thorax are intact.  IMPRESSION: No acute cardiopulmonary findings.   Original  Report Authenticated By: Sean Brock, M.D.    Ct Head Wo Contrast  05/27/2012  *RADIOLOGY REPORT*  Clinical Data: Stroke, right facial droop, slurred speech, generalized weakness, unsteady gait, history hypertension, hyperlipidemia, smoking, coronary artery disease  CT HEAD WITHOUT CONTRAST  Technique:  Contiguous axial images were obtained from the base of the skull through the vertex without contrast.  Comparison: 11/03/2009  Findings: Generalized atrophy. Normal ventricular morphology. No midline shift or mass effect. Scattered streak artifacts. Question tiny remote infarct right caudate head. No intracranial hemorrhage, mass lesion, or evidence of acute infarction. No extra-axial fluid collections. Mucosal retention cyst right sphenoid sinus. Bones and sinuses otherwise unremarkable.  IMPRESSION: No acute intracranial abnormalities. Questionable tiny remote lacunar infarct right caudate head.   Original Report Authenticated By: Ulyses Southward, M.D.     EKG: t wave inversions in v5 and v6, sinus brady and t wave flattening in II , III  Assessment/Plan Active Problems: 1. Left facial droop: improved.  - admitting to telemetry  for evaluation of stroke. - initial CT showed tiny lacunar infarct, probably not contributing to the deficit.  - follow up with MRI/MRA head and neck - carotid duplex/echo ordered - passed bed side swallow eval on cardiac diet - hgba1c and lipid panel in am.  - start him on 325 mg aspirin . He was on baby aspirin at home.  - PT/OT eval 's pending.   2. Hypertension: permissive hypertension  3. CAD/ Aortic stenosis: no chest pain or sob, EKG slightly abnormal. We will repeat it again later today.   4. Hyperlipidemia: lipid panel and resume statin.   5. DVT prophylaxis  Code Status: presumed full code Family Communication: wife at bedside Disposition Plan: possibly tomorrow.    [facility] Pager [phone]  If 7PM-7AM, please contact  night-coverage [email or web address] Password [facility] 05/27/2012, 2:43 PM

## 2012-05-27 NOTE — ED Notes (Signed)
Called to [phone]

## 2012-05-28 ENCOUNTER — Encounter (HOSPITAL_COMMUNITY): Payer: Self-pay | Admitting: General Practice

## 2012-05-28 DIAGNOSIS — I498 Other specified cardiac arrhythmias: Secondary | ICD-10-CM

## 2012-05-28 DIAGNOSIS — I359 Nonrheumatic aortic valve disorder, unspecified: Secondary | ICD-10-CM

## 2012-05-28 DIAGNOSIS — R42 Dizziness and giddiness: Secondary | ICD-10-CM

## 2012-05-28 DIAGNOSIS — I6350 Cerebral infarction due to unspecified occlusion or stenosis of unspecified cerebral artery: Secondary | ICD-10-CM

## 2012-05-28 LAB — LIPID PANEL
HDL: 33 mg/dL — ABNORMAL LOW (ref >39)
LDL Cholesterol: 48 mg/dL (ref 0–99)
Total CHOL/HDL Ratio: 3.3 RATIO
Triglycerides: 146 mg/dL (ref <150)

## 2012-05-28 LAB — HEMOGLOBIN A1C
Hgb A1c MFr Bld: 5.9 % — ABNORMAL HIGH (ref <5.7)
Mean Plasma Glucose: 123 mg/dL — ABNORMAL HIGH (ref <117)

## 2012-05-28 MED ORDER — CLOPIDOGREL BISULFATE 75 MG PO TABS: 75.0000 mg | ORAL_TABLET | Freq: Every day | ORAL | Status: DC

## 2012-05-28 MED ORDER — STROKE: EARLY STAGES OF RECOVERY BOOK
Freq: Once | Status: AC
  Administered 2012-05-28: 15:00:00
  Filled 2012-05-28: qty 1

## 2012-05-28 NOTE — Progress Notes (Signed)
Stroke Team Progress Note  HISTORY Sean Brock is an 78 y.o. male who takes a ASA daily and went to sleep at 10:30 last night 05/26/2012 normal. Upon waking this AM 05/27/2012 his wife noted a left facial droop and patient noted he may have been slurring his words slightly. They did not initially go to the hospital, but after patient noted his face was asymmetrical he was brought by private car to hospital. Initial CT head was negative but follow up MRI did show a acute right parietal infarct. Patient was not a TPA candidate secondary to delay in arrival. He was admitted for further evaluation and treatment.  SUBJECTIVE His wife is at the bedside.  Patient is currently in the vascular lab. Overall he feels his condition is gradually improving.   OBJECTIVE Most recent Vital Signs: Filed Vitals:   05/28/12 0245 05/28/12 0417 05/28/12 0500 05/28/12 0635  BP: 143/74 114/65  118/58  Pulse: 90 77  74  Temp:   97.8 F (36.6 C) 97.8 F (36.6 C)  TempSrc:      Resp:  17 18 18   SpO2: 96% 97%  96%   CBG (last 3)   Recent Labs  05/27/12 1215  GLUCAP 98    IV Fluid Intake:     MEDICATIONS  . clopidogrel  75 mg Oral Q breakfast  . enoxaparin (LOVENOX) injection  40 mg Subcutaneous Q24H  . omega-3 acid ethyl esters  2 g Oral Daily  . pantoprazole  40 mg Oral Daily  . rosuvastatin  10 mg Oral q1800  . tamsulosin  0.4 mg Oral QHS   PRN:  senna-docusate  Diet:  Cardiac thin liquids Activity: OOB in chair DVT Prophylaxis:  Lovenox 40 mg sq daily   CLINICALLY SIGNIFICANT STUDIES Basic Metabolic Panel:  Recent Labs Lab 05/27/12 1104 05/27/12 1140  NA 140 142  K 3.4* 3.5  CL 102 104  CO2 26  --   GLUCOSE 97 96  BUN 21 22  CREATININE 1.17 1.20  CALCIUM 9.3  --    Liver Function Tests:  Recent Labs Lab 05/27/12 1104  AST 14  ALT 12  ALKPHOS 41  BILITOT 0.7  PROT 7.1  ALBUMIN 4.0   CBC:  Recent Labs Lab 05/27/12 1104 05/27/12 1140  WBC 6.2  --   NEUTROABS 3.4  --    HGB 14.0 14.3  HCT 39.2 42.0  MCV 84.1  --   PLT 210  --    Coagulation:  Recent Labs Lab 05/27/12 1120  LABPROT 13.3  INR 1.02   Cardiac Enzymes:  Recent Labs Lab 05/27/12 1120  TROPONINI <0.30   Urinalysis:  Recent Labs Lab 05/27/12 1211  COLORURINE YELLOW  LABSPEC 1.019  PHURINE 6.0  GLUCOSEU NEGATIVE  HGBUR NEGATIVE  BILIRUBINUR NEGATIVE  KETONESUR NEGATIVE  PROTEINUR NEGATIVE  UROBILINOGEN 1.0  NITRITE NEGATIVE  LEUKOCYTESUR NEGATIVE   Lipid Panel    Component Value Date/Time   CHOL 110 05/28/2012 0550   TRIG 146 05/28/2012 0550   HDL 33* 05/28/2012 0550   CHOLHDL 3.3 05/28/2012 0550   VLDL 29 05/28/2012 0550   LDLCALC 48 05/28/2012 0550   HgbA1C  No results found for this basename: HGBA1C    Urine Drug Screen:     Component Value Date/Time   LABOPIA NONE DETECTED 05/27/2012 1211   COCAINSCRNUR NONE DETECTED 05/27/2012 1211   LABBENZ NONE DETECTED 05/27/2012 1211   AMPHETMU NONE DETECTED 05/27/2012 1211   THCU NONE DETECTED 05/27/2012 1211  LABBARB NONE DETECTED 05/27/2012 1211    Alcohol Level:  Recent Labs Lab 05/27/12 1120  ETH <11   CT of the brain  05/27/2012   No acute intracranial abnormalities. Questionable tiny remote lacunar infarct right caudate head.   MRI of the brain  05/27/2012  1.  Two nearby acute small vessel infarcts in the right MCA territory:  corona radiata and frontal lobe subcortical white matter.  No mass effect or hemorrhage. 2.  Underlying chronic small vessel disease.  Chronic lacunar infarct in the right caudate.   MRA of the brain  05/27/2012  1. Right MCA is within normal limits. 2.  Mild ICA siphon dolichoectasia. 3.  Otherwise negative intracranial MRA.    2D Echocardiogram    Carotid Doppler  No evidence of hemodynamically significant internal carotid artery stenosis. Vertebral artery flow is antegrade.   CXR  05/27/2012   No acute cardiopulmonary findings.     EKG  sinus bradycardia.   Therapy Recommendations    Physical Exam   Middle aged Caucasian male not in distress.Awake alert. Afebrile. Head is nontraumatic. Neck is supple without bruit. Hearing is normal. Cardiac exam no murmur or gallop. Lungs are clear to auscultation. Distal pulses are well felt. Neurological exam ; Awake alert oriented x 3 normal speech and language. Mild left lower face asymmetry. Tongue midline. No drift. Mild diminished fine finger movements on left. Orbits right over left upper extremity.  .. Normal sensation . Normal coordination. ASSESSMENT [name] is a 78 y.o. male presenting with left facial weakness and slurred speech. Imaging confirms a two small right MCA infarcts. Infarct felt to be thrombotic secondary to small vessel disease.  On aspirin 81 mg orally every day prior to admission. Now on clopidogrel 75 mg orally every day for secondary stroke prevention. Patient with resultant left facial weakness and dysarthria, which are both improving. Work up underway.  Hypertension Hyperlipidemia, LDL 48, on statin PTA, on statin now, goal LDL < 100 AS CAD  Hospital day # 1  TREATMENT/PLAN  Continue clopidogrel 75 mg orally every day for secondary stroke prevention.  F/u 2D, HgbA1c  OOB as tolerated. Therapy evals  Anticipate discharge home once workup completed  Will need follow up with Dr. Pearlean Brownie in 2 months.  Annie Main, MSN, RN, ANVP-BC, ANP-BC, [facility] Pager: [phone] 05/28/2012 9:53 AM  I have personally obtained a history, examined the patient, evaluated imaging results, and formulated the assessment and plan of care. I agree with the above.  Delia Heady

## 2012-05-28 NOTE — Progress Notes (Signed)
Received order to discharge to home. Reviewed all discharge instructions with patient including appointments, medications, when to call doctor/911, signs and symptoms of a stroke, monitoring blood pressure/pulse, activity, diet, community resources, [place], how to prevent infection and all other discharge instructions. Copy of discharge instructions given to patient, stroke pathway book given to patient, prescription given to patient and questions and concerns answered

## 2012-05-28 NOTE — Progress Notes (Signed)
  Echocardiogram 2D Echocardiogram has been performed.  Sean Brock 05/28/2012, 10:18 AM

## 2012-05-28 NOTE — Discharge Summary (Signed)
Physician Discharge Summary  Sean Brock ZOX:[id number] DOB: 1934/11/08 DOA: 05/27/2012  PCP: Sean Lance, MD  Admit date: 05/27/2012 Discharge date: 05/28/2012    Recommendations for Outpatient Follow-up:  1. FOLLOW UPW ITH DR SETHI IN 2 MONTHS 2. FOLLOW UP WITH PCP in 1 to 2 weeks.   Discharge Diagnoses:  CVA HYPERTENSION HYPERLIPIDEMIA.  CAD   Discharge Condition: fair.  Diet recommendation: low sodium diet    History of present illness:  Sean Brock is an 78 y.o. male who takes a ASA daily and went to sleep at 10:30 last night 05/26/2012 normal. Upon waking this AM 05/27/2012 his wife noted a left facial droop and patient noted he may have been slurring his words slightly. They did not initially go to the hospital, but after patient noted his face was asymmetrical he was brought by private car to hospital. Initial CT head was negative but follow up MRI did show a acute right parietal infarct. Patient was not a TPA candidate secondary to delay in arrival. He was admitted for further evaluation and treatment.   Hospital Course:  1. Left facial droop: improved.  He was admitted  to telemetry for evaluation of stroke.   initial CT showed tiny lacunar infarct, probably not contributing to the deficit.  MRI/MRA head and neck showed two nearby acute small vessel infarcts in the right MCA territory. Chronic lacunar infarct int he right caudate.  - carotid duplex/echo ordered which were within normal limits.  - passed bed side swallow eval on cardiac diet  He is being discharged with plavix 75 mg daily. Follow up with Dr Pearlean Brownie in 60m onths.  2. Hypertension: resume home medications.  3. CAD/ Aortic stenosis: no chest pain or sob, .  4. Hyperlipidemia: lipid panel and resume statin.      Consultations:  neurology  Discharge Exam: Filed Vitals:   05/28/12 0800 05/28/12 1057 05/28/12 1420 05/28/12 1422  BP: 112/75 128/77 162/65 152/66  Pulse: 79 91 58 54  Temp:    97.6 F (36.4 C)   TempSrc:   Oral   Resp:   18   SpO2:  98% 96%   alert afebrile comfortable. Cardiovascular: bradycardic, SM at the sternal border radiating to neck, pulses symmetric and intact bilaterally Pulmonary/Chest: CTAB, no wheezes, rales, or rhonchi Abdominal: Soft. Non-tender, non-distended, bowel sounds are normal, no masses, organomegaly, or guarding present. GU: no CVA tenderness Musculoskeletal: No joint deformities, erythema, or stiffness, ROM full and no nontender Hematology: no cervical, inginal, or axillary adenopathy.  Neurological: A&O x3, Strength is normal and symmetric bilaterally,slight facial droop, no focal motor deficit, sensory intact to light touch bilaterally.     Discharge Instructions  Discharge Orders   Future Appointments Provider Department Dept Phone   06/11/2012 7:30 AM [facility] SITE 3 ECHO LAB [phone]   Future Orders Complete By Expires     Activity as tolerated - No restrictions  As directed     Diet - low sodium heart healthy  As directed     Discharge instructions  As directed     Comments:      Follow up with PCP in 1 to 2 week.s Follow up with Cardiology as scheduled.        Medication List    STOP taking these medications       aspirin 81 MG tablet      TAKE these medications       amLODipine 10 MG tablet  Commonly known as:  NORVASC  Take 1 tablet (10 mg total) by mouth daily.     clopidogrel 75 MG tablet  Commonly known as:  PLAVIX  Take 1 tablet (75 mg total) by mouth daily with breakfast.     dextran 70-hypromellose ophthalmic solution  Place into both eyes daily as needed (DRY EYES).     fish oil-omega-3 fatty acids 1000 MG capsule  Take 2 g by mouth daily.     omeprazole 20 MG capsule  Commonly known as:  PRILOSEC  Take 20 mg by mouth daily.     rosuvastatin 10 MG tablet  Commonly known as:  CRESTOR  Take 10 mg by mouth daily.     tamsulosin 0.4 MG Caps  Commonly  known as:  FLOMAX  Take 0.4 mg by mouth at bedtime.     triamterene-hydrochlorothiazide 37.5-25 MG per capsule  Commonly known as:  DYAZIDE  Take 1 capsule by mouth every morning.          The results of significant diagnostics from this hospitalization (including imaging, microbiology, ancillary and laboratory) are listed below for reference.    Significant Diagnostic Studies: Dg Chest 2 View  05/27/2012  *RADIOLOGY REPORT*  Clinical Data: Left facial droop.  CHEST - 2 VIEW  Comparison: 02/12/2004  Findings: The lungs are clear without focal infiltrate, edema, pneumothorax or pleural effusion. Hyperexpansion is consistent with emphysema. The cardiopericardial silhouette is within normal limits for size. Imaged bony structures of the thorax are intact.  IMPRESSION: No acute cardiopulmonary findings.   Original Report Authenticated By: Kennith Center, M.D.    Ct Head Wo Contrast  05/27/2012  *RADIOLOGY REPORT*  Clinical Data: Stroke, right facial droop, slurred speech, generalized weakness, unsteady gait, history hypertension, hyperlipidemia, smoking, coronary artery disease  CT HEAD WITHOUT CONTRAST  Technique:  Contiguous axial images were obtained from the base of the skull through the vertex without contrast.  Comparison: 11/03/2009  Findings: Generalized atrophy. Normal ventricular morphology. No midline shift or mass effect. Scattered streak artifacts. Question tiny remote infarct right caudate head. No intracranial hemorrhage, mass lesion, or evidence of acute infarction. No extra-axial fluid collections. Mucosal retention cyst right sphenoid sinus. Bones and sinuses otherwise unremarkable.  IMPRESSION: No acute intracranial abnormalities. Questionable tiny remote lacunar infarct right caudate head.   Original Report Authenticated By: Ulyses Southward, M.D.    Mr Angiogram Head Wo Contrast  05/27/2012  *RADIOLOGY REPORT*  Clinical Data:  78 year old male with onset of left facial droop a in this  morning.  Preceding weakness.  No acute finding on the earlier noncontrast head CT.  Comparison: Head CT without contrast 05/27/2012.  Brain MRI 02/11/2010.  MRI HEAD WITHOUT CONTRAST  Technique: Multiplanar, multiecho pulse sequences of the brain and surrounding structures were obtained according to standard protocol without intravenous contrast.  Findings: 10 mm focus of restricted diffusion in the right corona radiata (series 4 image 20) with nearby 4 mm focus of subcortical white matter restricted diffusion (image 21).  Associated T2 and FLAIR hyperintensity.  No associated mass effect or hemorrhage.  Diffusion elsewhere is within normal limits. Major intracranial vascular flow voids are preserved.  Dilated perivascular spaces in the lentiform nuclei and thalami.  Chronic lacunar infarct in the right caudate nucleus. Probable also dilated perivascular spaces in the left corona radiata out.  No cortical encephalomalacia.  Brain stem and cerebellum within normal limits.  Negative pituitary, cervicomedullary junction visualized cervical spine.  Postoperative changes to the globes.  Occasional paranasal sinus  mucous retention cyst.  Mastoids are clear.  Grossly negative visualized internal auditory structures.  Normal bone marrow signal.  Negative scalp soft tissues.  Degenerative changes at the right skull base - C1 articulation.  IMPRESSION: 1.  Two nearby acute small vessel infarcts in the right MCA territory:  corona radiata and frontal lobe subcortical white matter.  No mass effect or hemorrhage. 2.  Underlying chronic small vessel disease.  Chronic lacunar infarct in the right caudate. 3.  MRA findings are below.  MRA HEAD WITHOUT CONTRAST  Technique: Angiographic images of the Circle of Willis were obtained using MRA technique without  intravenous contrast.  Findings: Antegrade flow in the posterior circulation codominant distal vertebral arteries.  Normal left PICA.  Patent vertebrobasilar junction.  No  basilar stenosis.  SCA and right PCA origins are within normal limits.  Fetal left PCA origin.  Right posterior communicating artery diminutive or absent.  Bilateral PCA branches are within normal limits.  Antegrade flow in both ICA siphons.  Mild ICA dolichoectasia, more so on the left.  Ophthalmic and posterior communicating artery origins are within normal limits.  Normal carotid termini.  Normal MCA and ACA origins.  Anterior communicating artery diminutive or absent.  Visualized ACA branches are within normal limits.  Mild irregularity of the left MCA M1 segment.  No associated stenosis and otherwise the visualized left MCA branches are within normal limits.  Right MCA origin and M1 segment are within normal limits.  Right MCA bifurcation is patent.  Proximal M2 segments are within normal limits.  No focal right MCA branch stenosis or occlusion identified.  IMPRESSION: 1. Right MCA is within normal limits. 2.  Mild ICA siphon dolichoectasia. 3.  Otherwise negative intracranial MRA.   Original Report Authenticated By: Erskine Speed, M.D.    Mr Brain Wo Contrast  05/27/2012  *RADIOLOGY REPORT*  Clinical Data:  78 year old male with onset of left facial droop a in this morning.  Preceding weakness.  No acute finding on the earlier noncontrast head CT.  Comparison: Head CT without contrast 05/27/2012.  Brain MRI 02/11/2010.  MRI HEAD WITHOUT CONTRAST  Technique: Multiplanar, multiecho pulse sequences of the brain and surrounding structures were obtained according to standard protocol without intravenous contrast.  Findings: 10 mm focus of restricted diffusion in the right corona radiata (series 4 image 20) with nearby 4 mm focus of subcortical white matter restricted diffusion (image 21).  Associated T2 and FLAIR hyperintensity.  No associated mass effect or hemorrhage.  Diffusion elsewhere is within normal limits. Major intracranial vascular flow voids are preserved.  Dilated perivascular spaces in the lentiform  nuclei and thalami.  Chronic lacunar infarct in the right caudate nucleus. Probable also dilated perivascular spaces in the left corona radiata out.  No cortical encephalomalacia.  Brain stem and cerebellum within normal limits.  Negative pituitary, cervicomedullary junction visualized cervical spine.  Postoperative changes to the globes.  Occasional paranasal sinus mucous retention cyst.  Mastoids are clear.  Grossly negative visualized internal auditory structures.  Normal bone marrow signal.  Negative scalp soft tissues.  Degenerative changes at the right skull base - C1 articulation.  IMPRESSION: 1.  Two nearby acute small vessel infarcts in the right MCA territory:  corona radiata and frontal lobe subcortical white matter.  No mass effect or hemorrhage. 2.  Underlying chronic small vessel disease.  Chronic lacunar infarct in the right caudate. 3.  MRA findings are below.  MRA HEAD WITHOUT CONTRAST  Technique: Angiographic images of the  Circle of Willis were obtained using MRA technique without  intravenous contrast.  Findings: Antegrade flow in the posterior circulation codominant distal vertebral arteries.  Normal left PICA.  Patent vertebrobasilar junction.  No basilar stenosis.  SCA and right PCA origins are within normal limits.  Fetal left PCA origin.  Right posterior communicating artery diminutive or absent.  Bilateral PCA branches are within normal limits.  Antegrade flow in both ICA siphons.  Mild ICA dolichoectasia, more so on the left.  Ophthalmic and posterior communicating artery origins are within normal limits.  Normal carotid termini.  Normal MCA and ACA origins.  Anterior communicating artery diminutive or absent.  Visualized ACA branches are within normal limits.  Mild irregularity of the left MCA M1 segment.  No associated stenosis and otherwise the visualized left MCA branches are within normal limits.  Right MCA origin and M1 segment are within normal limits.  Right MCA bifurcation is  patent.  Proximal M2 segments are within normal limits.  No focal right MCA branch stenosis or occlusion identified.  IMPRESSION: 1. Right MCA is within normal limits. 2.  Mild ICA siphon dolichoectasia. 3.  Otherwise negative intracranial MRA.   Original Report Authenticated By: Erskine Speed, M.D.     Microbiology: No results found for this or any previous visit (from the past 240 hour(s)).   Labs: Basic Metabolic Panel:  Recent Labs Lab 05/27/12 1104 05/27/12 1140  NA 140 142  K 3.4* 3.5  CL 102 104  CO2 26  --   GLUCOSE 97 96  BUN 21 22  CREATININE 1.17 1.20  CALCIUM 9.3  --    Liver Function Tests:  Recent Labs Lab 05/27/12 1104  AST 14  ALT 12  ALKPHOS 41  BILITOT 0.7  PROT 7.1  ALBUMIN 4.0   No results found for this basename: LIPASE, AMYLASE,  in the last 168 hours No results found for this basename: AMMONIA,  in the last 168 hours CBC:  Recent Labs Lab 05/27/12 1104 05/27/12 1140  WBC 6.2  --   NEUTROABS 3.4  --   HGB 14.0 14.3  HCT 39.2 42.0  MCV 84.1  --   PLT 210  --    Cardiac Enzymes:  Recent Labs Lab 05/27/12 1120  TROPONINI <0.30   BNP: BNP (last 3 results) No results found for this basename: PROBNP,  in the last 8760 hours CBG:  Recent Labs Lab 05/27/12 1215  GLUCAP 98       Signed:  Tamani Durney  [facility] 05/28/2012, 3:01 PM

## 2012-05-28 NOTE — Progress Notes (Signed)
Bilateral:  No evidence of hemodynamically significant internal carotid artery stenosis.   Vertebral artery flow is antegrade.     

## 2012-05-28 NOTE — Discharge Instructions (Signed)
STROKE/TIA DISCHARGE INSTRUCTIONS SMOKING Cigarette smoking nearly doubles your risk of having a stroke & is the single most alterable risk factor  If you smoke or have smoked in the last 12 months, you are advised to quit smoking for your health.  Most of the excess cardiovascular risk related to smoking disappears within a year of stopping.  Ask you doctor about anti-smoking medications  [place] Quit Line: 1-800-QUIT NOW  Free Smoking Cessation Classes [phone]  CHOLESTEROL Know your levels; limit fat & cholesterol in your diet  Lipid Panel     Component Value Date/Time   CHOL 110 05/28/2012 0550   TRIG 146 05/28/2012 0550   HDL 33* 05/28/2012 0550   CHOLHDL 3.3 05/28/2012 0550   VLDL 29 05/28/2012 0550   LDLCALC 48 05/28/2012 0550      Many patients benefit from treatment even if their cholesterol is at goal.  Goal: Total Cholesterol (CHOL) less than 160  Goal:  Triglycerides (TRIG) less than 150  Goal:  HDL greater than 40  Goal:  LDL (LDLCALC) less than 100   BLOOD PRESSURE American Stroke Association blood pressure target is less that 120/80 mm/Hg  Your discharge blood pressure is:  BP: 118/58 mmHg  Monitor your blood pressure  Limit your salt and alcohol intake  Many individuals will require more than one medication for high blood pressure  DIABETES (A1c is a blood sugar average for last 3 months) Goal HGBA1c is under 7% (HBGA1c is blood sugar average for last 3 months)  Diabetes: {STROKE DC DIABETES:22357}    No results found for this basename: HGBA1C     Your HGBA1c can be lowered with medications, healthy diet, and exercise.  Check your blood sugar as directed by your physician  Call your physician if you experience unexplained or low blood sugars.  PHYSICAL ACTIVITY/REHABILITATION Goal is 30 minutes at least 4 days per week    {STROKE DC ACTIVITY/REHAB:22359}  Activity decreases your risk of heart attack and stroke and makes your heart stronger.  It helps  control your weight and blood pressure; helps you relax and can improve your mood.  Participate in a regular exercise program.  Talk with your doctor about the best form of exercise for you (dancing, walking, swimming, cycling).  DIET/WEIGHT Goal is to maintain a healthy weight  Your discharge diet is: Cardiac *** liquids Your height is:    Your current weight is:   Your Body Mass Index (BMI) is:     Following the type of diet specifically designed for you will help prevent another stroke.  Your goal weight range is:  ***  Your goal Body Mass Index (BMI) is 19-24.  Healthy food habits can help reduce 3 risk factors for stroke:  High cholesterol, hypertension, and excess weight.  RESOURCES Stroke/Support Group:  Call [phone]  they meet the 3rd Sunday of the month on the Rehab Unit at [facility], [place] ( no meetings June, July & Aug).  STROKE EDUCATION PROVIDED/REVIEWED AND GIVEN TO PATIENT Stroke warning signs and symptoms How to activate emergency medical system (call 911). Medications prescribed at discharge. Need for follow-up after discharge. Personal risk factors for stroke. Pneumonia vaccine given:   {STROKE DC YES/NO/DATE:22363} Flu vaccine given:   {STROKE DC YES/NO/DATE:22363} My questions have been answered, the writing is legible, and I understand these instructions.  I will adhere to these goals & educational materials that have been provided to me after my discharge from the hospital.

## 2012-05-28 NOTE — Evaluation (Addendum)
Physical Therapy Evaluation Patient Details Name: Sean Brock MRN: [id number] DOB: 06/29/1934 Today's Date: 05/28/2012 Time: 7829-5621 PT Time Calculation (min): 8 min  PT Assessment / Plan / Recommendation Clinical Impression  78 y.o. male admitted with L facial drooping, Dx of Two nearby acute small vessel infarcts in the right MCA territory . Pt independent with mobility upon eval today. He walked 250' without assistive device, no deficits noted. No further PT needs, no DME nor f/u PT indicated. Reinforced importance of seeking medical care immediately if he has signs of CVA in the future.     PT Assessment  Patent does not need any further PT services    Follow Up Recommendations  No PT follow up    Does the patient have the potential to tolerate intense rehabilitation      Barriers to Discharge        Equipment Recommendations  None recommended by PT    Recommendations for Other Services     Frequency      Precautions / Restrictions Precautions Precautions: None Restrictions Weight Bearing Restrictions: No   Pertinent Vitals/Pain *0/10 pain VSS**      Mobility  Bed Mobility Bed Mobility: Not assessed Transfers Transfers: Sit to Stand;Stand to Sit Sit to Stand: 7: Independent Stand to Sit: 7: Independent Ambulation/Gait Ambulation/Gait Assistance: 7: Independent Ambulation Distance (Feet): 250 Feet Assistive device: None Gait Pattern: Within Functional Limits Gait velocity: WNL General Gait Details: no LOB, no deficits noted    Exercises     PT Diagnosis:    PT Problem List:   PT Treatment Interventions:     PT Goals    Visit Information  Last PT Received On: 05/28/12 Assistance Needed: +1    Subjective Data  Subjective: I feel fine.  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 for tasks performed    Extremity/Trunk Assessment Right Upper Extremity Assessment RUE ROM/Strength/Tone: Within functional levels Left Upper Extremity Assessment LUE ROM/Strength/Tone: Within functional levels Right Lower Extremity Assessment RLE ROM/Strength/Tone: Within functional levels RLE Sensation: WFL - Light Touch RLE Coordination: WFL - gross/fine motor Left Lower Extremity Assessment LLE ROM/Strength/Tone: Within functional levels LLE Sensation: WFL - Light Touch LLE Coordination: WFL - gross/fine motor Trunk Assessment Trunk Assessment: Normal   Balance Balance Balance Assessed: Yes Static Sitting Balance Static Sitting - Balance Support: Feet supported;No upper extremity supported Static Sitting - Level of Assistance: 7: Independent Static Sitting - Comment/# of Minutes: 2  End of Session PT - End of Session Activity Tolerance: Patient tolerated treatment well Patient left: in bed;with call bell/phone within reach;with family/visitor present Nurse Communication: Mobility status  GP Functional Assessment Tool Used: clinical judgement Functional Limitation: Mobility: Walking and moving around Mobility: Walking and Moving Around Current Status (H0865): 0 percent impaired, limited or restricted Mobility: Walking and Moving Around Goal Status (H8469): 0 percent impaired, limited or restricted Mobility: Walking and Moving Around Discharge Status [id number]): 0 percent impaired, limited or restricted   Tamala Ser 05/28/2012, 11:35 AM

## 2012-05-29 NOTE — Progress Notes (Signed)
Utilization review completed.  

## 2012-06-11 ENCOUNTER — Other Ambulatory Visit (HOSPITAL_COMMUNITY): Payer: Medicare Other

## 2012-07-23 ENCOUNTER — Other Ambulatory Visit: Payer: Self-pay | Admitting: *Deleted

## 2012-07-23 MED ORDER — CLOPIDOGREL BISULFATE 75 MG PO TABS: 75.0000 mg | ORAL_TABLET | Freq: Every day | ORAL | Status: DC

## 2012-10-16 ENCOUNTER — Other Ambulatory Visit: Payer: Self-pay | Admitting: Cardiology

## 2012-12-24 ENCOUNTER — Encounter (INDEPENDENT_AMBULATORY_CARE_PROVIDER_SITE_OTHER): Payer: Self-pay | Admitting: Surgery

## 2012-12-26 ENCOUNTER — Encounter (INDEPENDENT_AMBULATORY_CARE_PROVIDER_SITE_OTHER): Payer: Self-pay

## 2012-12-26 ENCOUNTER — Encounter (INDEPENDENT_AMBULATORY_CARE_PROVIDER_SITE_OTHER): Payer: Self-pay | Admitting: Surgery

## 2012-12-26 ENCOUNTER — Ambulatory Visit (INDEPENDENT_AMBULATORY_CARE_PROVIDER_SITE_OTHER): Payer: Medicare Other | Admitting: Surgery

## 2012-12-26 VITALS — BP 158/88 | HR 60 | Temp 97.6°F | Resp 16 | Ht 75.0 in | Wt 187.8 lb

## 2012-12-26 DIAGNOSIS — K4090 Unilateral inguinal hernia, without obstruction or gangrene, not specified as recurrent: Secondary | ICD-10-CM

## 2012-12-26 NOTE — Progress Notes (Signed)
Patient ID: Sean Brock, male   DOB: [birth year], 78 y.o.   MRN: [id number]  Chief Complaint  Patient presents with  . Other    inguinal hernia    HPI Sean Brock is a 78 y.o. male.   HPI This is a pleasant gentleman referred by Dr. Manus Brock for evaluation of a right inguinal hernia. The patient reports that he has had the hernia for some time but is now getting larger. He has no pain. He reports it easily reduces. He has had no obstructive symptoms. He is otherwise without complaints. Past Medical History  Diagnosis Date  . HYPERLIPIDEMIA   . HYPERTENSION   . AORTIC STENOSIS     Mild to moderate. Echo (4/11) with EF 55-60%, mean gradient 18/peak 34;  echo 4/13: severe LVH, EF 55-60%, grade 1 diast dysfxn, mean 26/peak 53 c/w mild to mod AS  . Former smoker   . DYSPEPSIA   . Palpitations   . CAD (coronary artery disease)     LHC 11/1982:  mLAD 40%, pRCA 90%, dRCA 70% diffuse,  PDA occluded, normal LVF - treated medically  . Anginal pain   . Heart murmur   . Shortness of breath   . Stroke   . GERD (gastroesophageal reflux disease)   . Trigeminal neuropathy   . Arthritis     BACK  . Chronic kidney disease   . ED (erectile dysfunction)   . CVA (cerebral infarction)     Past Surgical History  Procedure Laterality Date  . Cataract extraction    . Lithotripsy    . Mouth surgery      Family History  Problem Relation Age of Onset  . Diabetes    . Hypertension    . Lung cancer    . Cancer Sister     breast    Social History History  Substance Use Topics  . Smoking status: Former Smoker -- 1.00 packs/day for 30 years    Types: Cigarettes    Quit date: 08/29/2009  . Smokeless tobacco: Never Used  . Alcohol Use: No    Allergies  Allergen Reactions  . Sudafed [Pseudoephedrine Hcl] Palpitations  . Cardizem [Diltiazem Hcl] Rash    Current Outpatient Prescriptions  Medication Sig Dispense Refill  . amLODipine (NORVASC) 10 MG tablet TAKE ONE TABLET BY MOUTH  EVERY DAY  90 tablet  3  . clopidogrel (PLAVIX) 75 MG tablet Take 1 tablet (75 mg total) by mouth daily with breakfast.  30 tablet  5  . fish oil-omega-3 fatty acids 1000 MG capsule Take 2 g by mouth daily.      . meloxicam (MOBIC) 15 MG tablet Take 15 mg by mouth daily.      Marland Kitchen omeprazole (PRILOSEC) 20 MG capsule Take 20 mg by mouth daily.      . rosuvastatin (CRESTOR) 10 MG tablet Take 10 mg by mouth daily.      . sildenafil (VIAGRA) 50 MG tablet Take 50 mg by mouth daily as needed for erectile dysfunction.      . Tamsulosin HCl (FLOMAX) 0.4 MG CAPS Take 0.4 mg by mouth at bedtime.       . triamterene-hydrochlorothiazide (DYAZIDE) 37.5-25 MG per capsule Take 1 capsule by mouth every morning.       No current facility-administered medications for this visit.    Review of Systems Review of Systems  Constitutional: Negative for fever, chills and unexpected weight change.  HENT: Negative for congestion, hearing loss, sore throat, trouble  swallowing and voice change.   Eyes: Negative for visual disturbance.  Respiratory: Negative for cough and wheezing.   Cardiovascular: Negative for chest pain, palpitations and leg swelling.  Gastrointestinal: Negative for nausea, vomiting, abdominal pain, diarrhea, constipation, blood in stool, abdominal distention, anal bleeding and rectal pain.  Genitourinary: Negative for hematuria and difficulty urinating.  Musculoskeletal: Negative for arthralgias.  Skin: Negative for rash and wound.  Neurological: Negative for seizures, syncope, weakness and headaches.  Hematological: Negative for adenopathy. Does not bruise/bleed easily.  Psychiatric/Behavioral: Negative for confusion.    Blood pressure 158/88, pulse 60, temperature 97.6 F (36.4 C), temperature source Temporal, resp. rate 16, height 6\' 3"  (1.905 m), weight 187 lb 12.8 oz (85.186 kg).  Physical Exam Physical Exam  Constitutional: He is oriented to person, place, and time. He appears  well-developed and well-nourished. No distress.  HENT:  Head: Normocephalic and atraumatic.  Right Ear: External ear normal.  Left Ear: External ear normal.  Nose: Nose normal.  Mouth/Throat: Oropharynx is clear and moist. No oropharyngeal exudate.  Eyes: Conjunctivae are normal. Pupils are equal, round, and reactive to light. Right eye exhibits no discharge. Left eye exhibits no discharge. No scleral icterus.  Neck: Normal range of motion. Neck supple. No tracheal deviation present. No thyromegaly present.  Cardiovascular: Normal rate, regular rhythm, normal heart sounds and intact distal pulses.   No murmur heard. Pulmonary/Chest: Effort normal and breath sounds normal. No respiratory distress. He has no wheezes.  Abdominal: Soft. Bowel sounds are normal. He exhibits no distension. There is no tenderness. There is no rebound.  There is a moderate size easily reducible right inguinal hernia. There is no evidence of umbilical hernia or left inguinal hernia  Musculoskeletal: Normal range of motion. He exhibits no edema and no tenderness.  Lymphadenopathy:    He has no cervical adenopathy.  Neurological: He is alert and oriented to person, place, and time.  Skin: Skin is warm and dry. No rash noted. He is not diaphoretic. No erythema.  Psychiatric: His behavior is normal. Judgment normal.    Data Reviewed    Assessment    Right inguinal hernia     Plan    Right inguinal hernia repair with mesh was recommended. I discussed this with the patient in detail. I discussed the surgical procedure. I discussed the risks which includes but is not limited to bleeding, infection, injury to shrink structures, nerve entrapment, chronic pain, recurrence, etc. He will have to stop his Plavix 5 days preoperatively. He understands and wishes to proceed. Surgery will be scheduled.        Sean Brock A 12/26/2012, 1:46 PM

## 2012-12-31 ENCOUNTER — Encounter (INDEPENDENT_AMBULATORY_CARE_PROVIDER_SITE_OTHER): Payer: Self-pay

## 2013-01-08 ENCOUNTER — Encounter (HOSPITAL_BASED_OUTPATIENT_CLINIC_OR_DEPARTMENT_OTHER): Payer: Self-pay | Admitting: *Deleted

## 2013-01-13 ENCOUNTER — Encounter (HOSPITAL_BASED_OUTPATIENT_CLINIC_OR_DEPARTMENT_OTHER)
Admission: RE | Admit: 2013-01-13 | Discharge: 2013-01-13 | Disposition: A | Discharge status: Home / Self Care | Payer: Medicare Other | Source: Ambulatory Visit | Attending: Surgery | Admitting: Surgery

## 2013-01-13 LAB — BASIC METABOLIC PANEL
CO2: 28 mEq/L (ref 19–32)
Calcium: 9.5 mg/dL (ref 8.4–10.5)
Chloride: 105 mEq/L (ref 96–112)
Creatinine, Ser: 0.79 mg/dL (ref 0.50–1.35)
Glucose, Bld: 107 mg/dL — ABNORMAL HIGH (ref 70–99)
Sodium: 140 mEq/L (ref 135–145)

## 2013-01-15 ENCOUNTER — Encounter (HOSPITAL_BASED_OUTPATIENT_CLINIC_OR_DEPARTMENT_OTHER)
Admission: RE | Disposition: A | Discharge status: Home / Self Care | Payer: Self-pay | Source: Ambulatory Visit | Attending: Surgery

## 2013-01-15 ENCOUNTER — Ambulatory Visit (HOSPITAL_BASED_OUTPATIENT_CLINIC_OR_DEPARTMENT_OTHER): Payer: Medicare Other | Admitting: Certified Registered Nurse Anesthetist

## 2013-01-15 ENCOUNTER — Encounter (HOSPITAL_BASED_OUTPATIENT_CLINIC_OR_DEPARTMENT_OTHER): Payer: Medicare Other | Admitting: Certified Registered Nurse Anesthetist

## 2013-01-15 ENCOUNTER — Encounter (HOSPITAL_BASED_OUTPATIENT_CLINIC_OR_DEPARTMENT_OTHER): Payer: Self-pay | Admitting: *Deleted

## 2013-01-15 ENCOUNTER — Ambulatory Visit (HOSPITAL_BASED_OUTPATIENT_CLINIC_OR_DEPARTMENT_OTHER)
Admission: RE | Admit: 2013-01-15 | Discharge: 2013-01-15 | Disposition: A | Discharge status: Home / Self Care | Payer: Medicare Other | Source: Ambulatory Visit | Attending: Surgery | Admitting: Surgery

## 2013-01-15 DIAGNOSIS — Z8673 Personal history of transient ischemic attack (TIA), and cerebral infarction without residual deficits: Secondary | ICD-10-CM | POA: Insufficient documentation

## 2013-01-15 DIAGNOSIS — I2510 Atherosclerotic heart disease of native coronary artery without angina pectoris: Secondary | ICD-10-CM | POA: Insufficient documentation

## 2013-01-15 DIAGNOSIS — K4090 Unilateral inguinal hernia, without obstruction or gangrene, not specified as recurrent: Secondary | ICD-10-CM

## 2013-01-15 DIAGNOSIS — E785 Hyperlipidemia, unspecified: Secondary | ICD-10-CM | POA: Insufficient documentation

## 2013-01-15 DIAGNOSIS — Z87891 Personal history of nicotine dependence: Secondary | ICD-10-CM | POA: Insufficient documentation

## 2013-01-15 DIAGNOSIS — I129 Hypertensive chronic kidney disease with stage 1 through stage 4 chronic kidney disease, or unspecified chronic kidney disease: Secondary | ICD-10-CM | POA: Insufficient documentation

## 2013-01-15 DIAGNOSIS — K3189 Other diseases of stomach and duodenum: Secondary | ICD-10-CM | POA: Insufficient documentation

## 2013-01-15 DIAGNOSIS — N189 Chronic kidney disease, unspecified: Secondary | ICD-10-CM | POA: Insufficient documentation

## 2013-01-15 DIAGNOSIS — I359 Nonrheumatic aortic valve disorder, unspecified: Secondary | ICD-10-CM | POA: Insufficient documentation

## 2013-01-15 DIAGNOSIS — K219 Gastro-esophageal reflux disease without esophagitis: Secondary | ICD-10-CM | POA: Insufficient documentation

## 2013-01-15 DIAGNOSIS — R002 Palpitations: Secondary | ICD-10-CM | POA: Insufficient documentation

## 2013-01-15 HISTORY — PX: INSERTION OF MESH: SHX5868

## 2013-01-15 HISTORY — PX: INGUINAL HERNIA REPAIR: SHX194

## 2013-01-15 SURGERY — REPAIR, HERNIA, INGUINAL, ADULT
Anesthesia: General | Site: Groin | Laterality: Right | Wound class: Clean

## 2013-01-15 MED ORDER — OXYCODONE HCL 5 MG PO TABS: 5.0000 mg | ORAL_TABLET | Freq: Once | ORAL | Status: DC | PRN

## 2013-01-15 MED ORDER — 0.9 % SODIUM CHLORIDE (POUR BTL) OPTIME
TOPICAL | Status: DC | PRN
  Administered 2013-01-15: 1000 mL

## 2013-01-15 MED ORDER — LACTATED RINGERS IV SOLN
INTRAVENOUS | Status: DC
  Administered 2013-01-15: 11:00:00 via INTRAVENOUS
  Administered 2013-01-15: 20 mL/h via INTRAVENOUS

## 2013-01-15 MED ORDER — CEFAZOLIN SODIUM-DEXTROSE 2-3 GM-% IV SOLR
2.0000 g | INTRAVENOUS | Status: AC
  Administered 2013-01-15: 2 g via INTRAVENOUS

## 2013-01-15 MED ORDER — FENTANYL CITRATE 0.05 MG/ML IJ SOLN
INTRAMUSCULAR | Status: AC
  Filled 2013-01-15: qty 6

## 2013-01-15 MED ORDER — BUPIVACAINE-EPINEPHRINE PF 0.5-1:200000 % IJ SOLN
INTRAMUSCULAR | Status: AC
  Filled 2013-01-15: qty 30

## 2013-01-15 MED ORDER — OXYCODONE HCL 5 MG/5ML PO SOLN: 5.0000 mg | Freq: Once | ORAL | Status: DC | PRN

## 2013-01-15 MED ORDER — HYDROMORPHONE HCL PF 1 MG/ML IJ SOLN: 0.2500 mg | INTRAMUSCULAR | Status: DC | PRN

## 2013-01-15 MED ORDER — FENTANYL CITRATE 0.05 MG/ML IJ SOLN
INTRAMUSCULAR | Status: DC | PRN
  Administered 2013-01-15 (×2): 50 ug via INTRAVENOUS

## 2013-01-15 MED ORDER — MIDAZOLAM HCL 2 MG/ML PO SYRP: 12.0000 mg | ORAL_SOLUTION | Freq: Once | ORAL | Status: DC | PRN

## 2013-01-15 MED ORDER — HYDROCODONE-ACETAMINOPHEN 5-325 MG PO TABS: 1.0000 | ORAL_TABLET | ORAL | Status: DC | PRN

## 2013-01-15 MED ORDER — ONDANSETRON HCL 4 MG/2ML IJ SOLN
4.0000 mg | Freq: Four times a day (QID) | INTRAMUSCULAR | Status: DC | PRN
Start: 2013-01-15 — End: 2013-01-15

## 2013-01-15 MED ORDER — DEXAMETHASONE SODIUM PHOSPHATE 4 MG/ML IJ SOLN
INTRAMUSCULAR | Status: DC | PRN
  Administered 2013-01-15: 4 mg via INTRAVENOUS

## 2013-01-15 MED ORDER — MIDAZOLAM HCL 2 MG/2ML IJ SOLN: 1.0000 mg | INTRAMUSCULAR | Status: DC | PRN

## 2013-01-15 MED ORDER — CEFAZOLIN SODIUM 1-5 GM-% IV SOLN
INTRAVENOUS | Status: AC
  Filled 2013-01-15: qty 100

## 2013-01-15 MED ORDER — PROPOFOL 10 MG/ML IV BOLUS
INTRAVENOUS | Status: DC | PRN
  Administered 2013-01-15: 200 mg via INTRAVENOUS

## 2013-01-15 MED ORDER — LIDOCAINE HCL (CARDIAC) 20 MG/ML IV SOLN
INTRAVENOUS | Status: DC | PRN
  Administered 2013-01-15: 60 mg via INTRAVENOUS

## 2013-01-15 MED ORDER — BUPIVACAINE-EPINEPHRINE 0.5% -1:200000 IJ SOLN
INTRAMUSCULAR | Status: DC | PRN
  Administered 2013-01-15: 20 mL

## 2013-01-15 MED ORDER — ONDANSETRON HCL 4 MG/2ML IJ SOLN
INTRAMUSCULAR | Status: DC | PRN
  Administered 2013-01-15: 4 mg via INTRAVENOUS

## 2013-01-15 MED ORDER — FENTANYL CITRATE 0.05 MG/ML IJ SOLN: 50.0000 ug | INTRAMUSCULAR | Status: DC | PRN

## 2013-01-15 SURGICAL SUPPLY — 44 items
BENZOIN TINCTURE PRP APPL 2/3 (GAUZE/BANDAGES/DRESSINGS) ×2 IMPLANT
BLADE HEX COATED 2.75 (ELECTRODE) ×2 IMPLANT
BLADE SURG 10 STRL SS (BLADE) ×2 IMPLANT
BLADE SURG ROTATE 9660 (MISCELLANEOUS) ×2 IMPLANT
CHLORAPREP W/TINT 26ML (MISCELLANEOUS) ×2 IMPLANT
COVER MAYO STAND STRL (DRAPES) ×2 IMPLANT
COVER TABLE BACK 60X90 (DRAPES) ×2 IMPLANT
DECANTER SPIKE VIAL GLASS SM (MISCELLANEOUS) IMPLANT
DRAIN PENROSE 1/2X12 LTX STRL (WOUND CARE) ×2 IMPLANT
DRAPE PED LAPAROTOMY (DRAPES) ×2 IMPLANT
DRAPE UTILITY XL STRL (DRAPES) ×2 IMPLANT
DRSG TEGADERM 4X4.75 (GAUZE/BANDAGES/DRESSINGS) ×2 IMPLANT
ELECT REM PT RETURN 9FT ADLT (ELECTROSURGICAL) ×2
ELECTRODE REM PT RTRN 9FT ADLT (ELECTROSURGICAL) ×1 IMPLANT
GAUZE SPONGE 4X4 12PLY STRL LF (GAUZE/BANDAGES/DRESSINGS) ×2 IMPLANT
GLOVE BIOGEL PI IND STRL 7.0 (GLOVE) ×1 IMPLANT
GLOVE BIOGEL PI INDICATOR 7.0 (GLOVE) ×1
GLOVE ECLIPSE 6.5 STRL STRAW (GLOVE) ×2 IMPLANT
GLOVE EXAM NITRILE MD LF STRL (GLOVE) ×2 IMPLANT
GLOVE SURG SIGNA 7.5 PF LTX (GLOVE) ×2 IMPLANT
GOWN PREVENTION PLUS XLARGE (GOWN DISPOSABLE) ×2 IMPLANT
GOWN PREVENTION PLUS XXLARGE (GOWN DISPOSABLE) ×2 IMPLANT
MESH PARIETEX PROGRIP RIGHT (Mesh General) ×2 IMPLANT
NEEDLE HYPO 22GX1.5 SAFETY (NEEDLE) ×2 IMPLANT
NEEDLE HYPO 25X1 1.5 SAFETY (NEEDLE) ×2 IMPLANT
NS IRRIG 1000ML POUR BTL (IV SOLUTION) ×2 IMPLANT
PACK BASIN DAY SURGERY FS (CUSTOM PROCEDURE TRAY) ×2 IMPLANT
PENCIL BUTTON HOLSTER BLD 10FT (ELECTRODE) ×2 IMPLANT
SLEEVE SCD COMPRESS KNEE MED (MISCELLANEOUS) ×2 IMPLANT
SPONGE INTESTINAL PEANUT (DISPOSABLE) IMPLANT
SPONGE LAP 4X18 X RAY DECT (DISPOSABLE) ×2 IMPLANT
STRIP CLOSURE SKIN 1/2X4 (GAUZE/BANDAGES/DRESSINGS) ×2 IMPLANT
SUT MNCRL AB 4-0 PS2 18 (SUTURE) ×2 IMPLANT
SUT SILK 2 0 SH (SUTURE) ×2 IMPLANT
SUT SURG 0 T 19/GS 22 1969 62 (SUTURE) IMPLANT
SUT VIC AB 2-0 CT1 27 (SUTURE) ×2
SUT VIC AB 2-0 CT1 TAPERPNT 27 (SUTURE) ×2 IMPLANT
SUT VIC AB 3-0 CT1 27 (SUTURE) ×1
SUT VIC AB 3-0 CT1 27XBRD (SUTURE) ×1 IMPLANT
SUT VICRYL AB 3 0 TIES (SUTURE) ×2 IMPLANT
SYR BULB 3OZ (MISCELLANEOUS) IMPLANT
SYR CONTROL 10ML LL (SYRINGE) ×2 IMPLANT
TOWEL OR 17X24 6PK STRL BLUE (TOWEL DISPOSABLE) ×2 IMPLANT
TOWEL OR NON WOVEN STRL DISP B (DISPOSABLE) IMPLANT

## 2013-01-15 NOTE — H&P (Signed)
Patient ID: Sean Brock, male DOB: 1934-06-11, 78 y.o. MRN: [id number]  Chief Complaint   Patient presents with   .  Other     inguinal hernia   HPI  [name] is a 78 y.o. male.  HPI  This is a pleasant gentleman referred by Dr. Manus Gunning for evaluation of a right inguinal hernia. The patient reports that he has had the hernia for some time but is now getting larger. He has no pain. He reports it easily reduces. He has had no obstructive symptoms. He is otherwise without complaints.  Past Medical History   Diagnosis  Date   .  HYPERLIPIDEMIA    .  HYPERTENSION    .  AORTIC STENOSIS      Mild to moderate. Echo (4/11) with EF 55-60%, mean gradient 18/peak 34; echo 4/13: severe LVH, EF 55-60%, grade 1 diast dysfxn, mean 26/peak 53 c/w mild to mod AS   .  Former smoker    .  DYSPEPSIA    .  Palpitations    .  CAD (coronary artery disease)      LHC 11/1982: mLAD 40%, pRCA 90%, dRCA 70% diffuse, PDA occluded, normal LVF - treated medically   .  Anginal pain    .  Heart murmur    .  Shortness of breath    .  Stroke    .  GERD (gastroesophageal reflux disease)    .  Trigeminal neuropathy    .  Arthritis      BACK   .  Chronic kidney disease    .  ED (erectile dysfunction)    .  CVA (cerebral infarction)     Past Surgical History   Procedure  Laterality  Date   .  Cataract extraction     .  Lithotripsy     .  Mouth surgery      Family History   Problem  Relation  Age of Onset   .  Diabetes     .  Hypertension     .  Lung cancer     .  Cancer  Sister      breast   Social History  History   Substance Use Topics   .  Smoking status:  Former Smoker -- 1.00 packs/day for 30 years     Types:  Cigarettes     Quit date:  08/29/2009   .  Smokeless tobacco:  Never Used   .  Alcohol Use:  No    Allergies   Allergen  Reactions   .  Sudafed [Pseudoephedrine Hcl]  Palpitations   .  Cardizem [Diltiazem Hcl]  Rash    Current Outpatient Prescriptions   Medication  Sig   Dispense  Refill   .  amLODipine (NORVASC) 10 MG tablet  TAKE ONE TABLET BY MOUTH EVERY DAY  90 tablet  3   .  clopidogrel (PLAVIX) 75 MG tablet  Take 1 tablet (75 mg total) by mouth daily with breakfast.  30 tablet  5   .  fish oil-omega-3 fatty acids 1000 MG capsule  Take 2 g by mouth daily.     .  meloxicam (MOBIC) 15 MG tablet  Take 15 mg by mouth daily.     Marland Kitchen  omeprazole (PRILOSEC) 20 MG capsule  Take 20 mg by mouth daily.     .  rosuvastatin (CRESTOR) 10 MG tablet  Take 10 mg by mouth daily.     Marland Kitchen  sildenafil (VIAGRA) 50 MG tablet  Take 50 mg by mouth daily as needed for erectile dysfunction.     .  Tamsulosin HCl (FLOMAX) 0.4 MG CAPS  Take 0.4 mg by mouth at bedtime.     .  triamterene-hydrochlorothiazide (DYAZIDE) 37.5-25 MG per capsule  Take 1 capsule by mouth every morning.      No current facility-administered medications for this visit.   Review of Systems  Review of Systems  Constitutional: Negative for fever, chills and unexpected weight change.  HENT: Negative for congestion, hearing loss, sore throat, trouble swallowing and voice change.  Eyes: Negative for visual disturbance.  Respiratory: Negative for cough and wheezing.  Cardiovascular: Negative for chest pain, palpitations and leg swelling.  Gastrointestinal: Negative for nausea, vomiting, abdominal pain, diarrhea, constipation, blood in stool, abdominal distention, anal bleeding and rectal pain.  Genitourinary: Negative for hematuria and difficulty urinating.  Musculoskeletal: Negative for arthralgias.  Skin: Negative for rash and wound.  Neurological: Negative for seizures, syncope, weakness and headaches.  Hematological: Negative for adenopathy. Does not bruise/bleed easily.  Psychiatric/Behavioral: Negative for confusion.  Blood pressure 158/88, pulse 60, temperature 97.6 F (36.4 C), temperature source Temporal, resp. rate 16, height 6\' 3"  (1.905 m), weight 187 lb 12.8 oz (85.186 kg).  Physical Exam  Physical  Exam  Constitutional: He is oriented to person, place, and time. He appears well-developed and well-nourished. No distress.  HENT:  Head: Normocephalic and atraumatic.  Right Ear: External ear normal.  Left Ear: External ear normal.  Nose: Nose normal.  Mouth/Throat: Oropharynx is clear and moist. No oropharyngeal exudate.  Eyes: Conjunctivae are normal. Pupils are equal, round, and reactive to light. Right eye exhibits no discharge. Left eye exhibits no discharge. No scleral icterus.  Neck: Normal range of motion. Neck supple. No tracheal deviation present. No thyromegaly present.  Cardiovascular: Normal rate, regular rhythm, normal heart sounds and intact distal pulses.  No murmur heard.  Pulmonary/Chest: Effort normal and breath sounds normal. No respiratory distress. He has no wheezes.  Abdominal: Soft. Bowel sounds are normal. He exhibits no distension. There is no tenderness. There is no rebound.  There is a moderate size easily reducible right inguinal hernia. There is no evidence of umbilical hernia or left inguinal hernia  Musculoskeletal: Normal range of motion. He exhibits no edema and no tenderness.  Lymphadenopathy:  He has no cervical adenopathy.  Neurological: He is alert and oriented to person, place, and time.  Skin: Skin is warm and dry. No rash noted. He is not diaphoretic. No erythema.  Psychiatric: His behavior is normal. Judgment normal.  Data Reviewed  Assessment  Right inguinal hernia  Plan  Right inguinal hernia repair with mesh was recommended. I discussed this with the patient in detail. I discussed the surgical procedure. I discussed the risks which includes but is not limited to bleeding, infection, injury to shrink structures, nerve entrapment, chronic pain, recurrence, etc. He will have to stop his Plavix 5 days preoperatively. He understands and wishes to proceed. Surgery will be scheduled.

## 2013-01-15 NOTE — Anesthesia Postprocedure Evaluation (Signed)
Anesthesia Post Note  Patient: Sean Brock  Procedure(s) Performed: Procedure(s) (LRB): HERNIA REPAIR INGUINAL ADULT (Right) INSERTION OF MESH (Right)  Anesthesia type: General  Patient location: PACU  Post pain: Pain level controlled and Adequate analgesia  Post assessment: Post-op Vital signs reviewed, Patient's Cardiovascular Status Stable, Respiratory Function Stable, Patent Airway and Pain level controlled  Last Vitals:  Filed Vitals:   01/15/13 1145  BP: 145/67  Pulse: 63  Temp:   Resp: 14    Post vital signs: Reviewed and stable  Level of consciousness: awake, alert  and oriented  Complications: No apparent anesthesia complications

## 2013-01-15 NOTE — Transfer of Care (Signed)
Immediate Anesthesia Transfer of Care Note  Patient: Sean Brock  Procedure(s) Performed: Procedure(s): HERNIA REPAIR INGUINAL ADULT (Right) INSERTION OF MESH (Right)  Patient Location: PACU  Anesthesia Type:General  Level of Consciousness: awake and patient cooperative  Airway & Oxygen Therapy: Patient Spontanous Breathing and Patient connected to face mask oxygen  Post-op Assessment: Report given to PACU RN and Post -op Vital signs reviewed and stable  Post vital signs: Reviewed and stable  Complications: No apparent anesthesia complications

## 2013-01-15 NOTE — Anesthesia Procedure Notes (Signed)
Procedure Name: LMA Insertion Date/Time: 01/15/2013 10:18 AM Performed by: Abigail Miyamoto A Pre-anesthesia Checklist: Patient identified, Emergency Drugs available, Suction available and Patient being monitored Patient Re-evaluated:Patient Re-evaluated prior to inductionOxygen Delivery Method: Circle System Utilized Preoxygenation: Pre-oxygenation with 100% oxygen Intubation Type: IV induction Ventilation: Mask ventilation without difficulty LMA: LMA inserted LMA Size: 5.0 Number of attempts: 1 Airway Equipment and Method: bite block Placement Confirmation: positive ETCO2 Tube secured with: Tape Dental Injury: Teeth and Oropharynx as per pre-operative assessment

## 2013-01-15 NOTE — Op Note (Signed)
HERNIA REPAIR INGUINAL ADULT, INSERTION OF MESH  Procedure Note  Sean Brock 01/15/2013   Pre-op Diagnosis: Right inguinal hernia     Post-op Diagnosis: same  Procedure(s): RIGHT HERNIA REPAIR INGUINAL ADULT INSERTION OF MESH  Surgeon(s): Shelly Rubenstein, MD  Anesthesia: General  Staff:  Circulator: Raliegh Scarlet, RN Relief Circulator: Herminio Commons, RN Scrub Person: Randalyn Rhea, RN  Estimated Blood Loss: Minimal                         Romond Pipkins A   Date: 01/15/2013  Time: 10:52 AM

## 2013-01-15 NOTE — Anesthesia Preprocedure Evaluation (Signed)
Anesthesia Evaluation  Patient identified by MRN, date of birth, ID band Patient awake    Reviewed: Allergy & Precautions, H&P , NPO status , Patient's Chart, lab work & pertinent test results  Airway Mallampati: II  Neck ROM: full    Dental   Pulmonary shortness of breath, former smoker,          Cardiovascular hypertension, + angina + dysrhythmias + Valvular Problems/Murmurs AS  H/o palpitations associated with chest pain.  Stress test reveals normal perfusion with no ischemia or scar.  AS is mild to moderate.   Neuro/Psych CVA    GI/Hepatic GERD-  ,  Endo/Other    Renal/GU      Musculoskeletal   Abdominal   Peds  Hematology   Anesthesia Other Findings   Reproductive/Obstetrics                           Anesthesia Physical Anesthesia Plan  ASA: III  Anesthesia Plan: General   Post-op Pain Management:    Induction: Intravenous  Airway Management Planned: LMA  Additional Equipment:   Intra-op Plan:   Post-operative Plan:   Informed Consent: I have reviewed the patients History and Physical, chart, labs and discussed the procedure including the risks, benefits and alternatives for the proposed anesthesia with the patient or authorized representative who has indicated his/her understanding and acceptance.     Plan Discussed with: CRNA, Anesthesiologist and Surgeon  Anesthesia Plan Comments:         Anesthesia Quick Evaluation

## 2013-01-16 ENCOUNTER — Encounter (HOSPITAL_BASED_OUTPATIENT_CLINIC_OR_DEPARTMENT_OTHER): Payer: Self-pay | Admitting: Surgery

## 2013-01-17 NOTE — Op Note (Deleted)
[name], [name] NO.:  [id number]  MEDICAL RECORD NO.:  [id number]  LOCATION:  3W37C                        FACILITY:  [facility]  PHYSICIAN:  Abigail Miyamoto, M.D. DATE OF BIRTH:  Dec 11, 1934  DATE OF PROCEDURE:  01/15/2013 DATE OF DISCHARGE:  05/28/2012                              OPERATIVE REPORT   PREOPERATIVE DIAGNOSIS:  Right inguinal hernia.  POSTOPERATIVE DIAGNOSIS:  Right inguinal hernia.  PROCEDURE:  Right inguinal hernia repair with mesh.  SURGEON:  Abigail Miyamoto, M.D.  ANESTHESIA:  General and 0.5% Marcaine.  ESTIMATED BLOOD LOSS:  Minimal.  FINDINGS:  The patient was found to have a direct inguinal hernia, which was repaired with a piece of Parietex ProGrip mesh.  PROCEDURE IN DETAIL:  The patient was brought to the operating room, identified as, Sean Brock.  He was placed supine on the operating room table and general anesthesia was induced.  His right lower quadrant and groin were then prepped and draped in the usual sterile fashion.  I performed a right ilioinguinal nerve block with Marcaine.  I anesthetized the skin with Marcaine as well.  I then performed a longitudinal incision with a scalpel.  I took this down through Scarpa fascia with electrocautery.  The external oblique fascia was identified and opened towards the internal and external rings.  The testicular cord and structures were controlled with Penrose drain.  The patient had a small lipoma of the testicular cord, but no evidence of indirect hernia. He had a large direct hernia defect on the floor.  I was able to involute the sac and imbricated with two separate 2-0 silk sutures.  I controlled some bleeding on testicular cord with 2-0 silk suture as well.  I then brought a piece of Parietex ProGrip mesh onto the field. I placed it on the inguinal floor and brought around the cord structures.  I then sewed it in place with several 2-0 Vicryl sutures. Good coverage of  the inguinal floor appeared to be achieved.  I then closed the external oblique fascia over the top of this with a running 2- 0 Vicryl suture.  I anesthetized the wounds further with Marcaine.  I irrigated the wounds with saline.  Hemostasis appeared to be achieved with the cautery.  I closed the external oblique fascia with interrupted 3-0 Vicryl sutures and closed the skin with a running 4-0 Monocryl. Steri- Strips, gauze, and tape were then applied.  The patient tolerated the procedure well.  All other counts were correct at the end of the procedure.  The patient was then extubated in the operating room and taken in a stable condition to the recovery room.     Abigail Miyamoto, M.D.     DB/MEDQ  D:  01/15/2013  T:  01/16/2013  Job:  [id number]

## 2013-01-17 NOTE — Op Note (Signed)
NAME:  Sean Brock, Sean Brock               ACCOUNT NO.:  [id number]  MEDICAL RECORD NO.:  [id number]  LOCATION:  3W37C                        FACILITY:  [facility]  PHYSICIAN:  Daylene Vandenbosch, M.D. DATE OF BIRTH:  05/01/1934  DATE OF PROCEDURE:  01/15/2013 DATE OF DISCHARGE:  05/28/2012                              OPERATIVE REPORT   PREOPERATIVE DIAGNOSIS:  Right inguinal hernia.  POSTOPERATIVE DIAGNOSIS:  Right inguinal hernia.  PROCEDURE:  Right inguinal hernia repair with mesh.  SURGEON:  Leyna Vanderkolk, M.D.  ANESTHESIA:  General and 0.5% Marcaine.  ESTIMATED BLOOD LOSS:  Minimal.  FINDINGS:  The patient was found to have a direct inguinal hernia, which was repaired with a piece of Parietex ProGrip mesh.  PROCEDURE IN DETAIL:  The patient was brought to the operating room, identified as, Sean Brock.  He was placed supine on the operating room table and general anesthesia was induced.  His right lower quadrant and groin were then prepped and draped in the usual sterile fashion.  I performed a right ilioinguinal nerve block with Marcaine.  I anesthetized the skin with Marcaine as well.  I then performed a longitudinal incision with a scalpel.  I took this down through Scarpa fascia with electrocautery.  The external oblique fascia was identified and opened towards the internal and external rings.  The testicular cord and structures were controlled with Penrose drain.  The patient had a small lipoma of the testicular cord, but no evidence of indirect hernia. He had a large direct hernia defect on the floor.  I was able to involute the sac and imbricated with two separate 2-0 silk sutures.  I controlled some bleeding on testicular cord with 2-0 silk suture as well.  I then brought a piece of Parietex ProGrip mesh onto the field. I placed it on the inguinal floor and brought around the cord structures.  I then sewed it in place with several 2-0 Vicryl sutures. Good coverage of  the inguinal floor appeared to be achieved.  I then closed the external oblique fascia over the top of this with a running 2- 0 Vicryl suture.  I anesthetized the wounds further with Marcaine.  I irrigated the wounds with saline.  Hemostasis appeared to be achieved with the cautery.  I closed the external oblique fascia with interrupted 3-0 Vicryl sutures and closed the skin with a running 4-0 Monocryl. Steri- Strips, gauze, and tape were then applied.  The patient tolerated the procedure well.  All other counts were correct at the end of the procedure.  The patient was then extubated in the operating room and taken in a stable condition to the recovery room.     Otisha Spickler, M.D.     DB/MEDQ  D:  01/15/2013  T:  01/16/2013  Job:  [id number] 

## 2013-01-21 ENCOUNTER — Other Ambulatory Visit: Payer: Self-pay | Admitting: Cardiology

## 2013-01-29 ENCOUNTER — Ambulatory Visit (INDEPENDENT_AMBULATORY_CARE_PROVIDER_SITE_OTHER): Payer: Medicare Other | Admitting: Surgery

## 2013-01-29 ENCOUNTER — Encounter (INDEPENDENT_AMBULATORY_CARE_PROVIDER_SITE_OTHER): Payer: Self-pay | Admitting: Surgery

## 2013-01-29 VITALS — BP 142/64 | HR 60 | Resp 16 | Ht 75.0 in | Wt 187.8 lb

## 2013-01-29 DIAGNOSIS — Z09 Encounter for follow-up examination after completed treatment for conditions other than malignant neoplasm: Secondary | ICD-10-CM

## 2013-01-29 NOTE — Progress Notes (Signed)
Subjective:     Patient ID: Sean Brock, male   DOB: 12-16-1934, 78 y.o.   MRN: [id number]  HPI  He is here for his postoperative visit status post right inguinal hernia repair with mesh. He is doing well and has no complaints. Review of Systems     Objective:   Physical Exam On exam, his incision is healing well with minimal swelling and no evidence of recurrence    Assessment:     Patient stable postop     Plan:     He will refrain from heavy lifting for 2 more weeks. He may then slowly transitioned to normal activity. I will see him back as needed

## 2013-02-19 ENCOUNTER — Other Ambulatory Visit: Payer: Self-pay | Admitting: Cardiology

## 2013-03-11 ENCOUNTER — Ambulatory Visit: Payer: Medicare Other | Admitting: Cardiology

## 2013-03-25 ENCOUNTER — Other Ambulatory Visit: Payer: Self-pay | Admitting: Cardiology

## 2013-03-31 ENCOUNTER — Encounter: Payer: Self-pay | Admitting: *Deleted

## 2013-03-31 ENCOUNTER — Ambulatory Visit (INDEPENDENT_AMBULATORY_CARE_PROVIDER_SITE_OTHER): Payer: Medicare Other | Admitting: Cardiology

## 2013-03-31 ENCOUNTER — Encounter: Payer: Self-pay | Admitting: Cardiology

## 2013-03-31 VITALS — BP 122/62 | HR 56 | Ht 75.0 in | Wt 193.0 lb

## 2013-03-31 DIAGNOSIS — I6350 Cerebral infarction due to unspecified occlusion or stenosis of unspecified cerebral artery: Secondary | ICD-10-CM

## 2013-03-31 DIAGNOSIS — I639 Cerebral infarction, unspecified: Secondary | ICD-10-CM

## 2013-03-31 DIAGNOSIS — I359 Nonrheumatic aortic valve disorder, unspecified: Secondary | ICD-10-CM

## 2013-03-31 DIAGNOSIS — E785 Hyperlipidemia, unspecified: Secondary | ICD-10-CM

## 2013-03-31 DIAGNOSIS — I10 Essential (primary) hypertension: Secondary | ICD-10-CM

## 2013-03-31 NOTE — Patient Instructions (Addendum)
You have been referred to EP--Dr Klein/Dr Taylor/Dr Allred for evaluation for placement of LINQ monitor.   Your physician has requested that you have an echocardiogram. Echocardiography is a painless test that uses sound waves to create images of your heart. It provides your doctor with information about the size and shape of your heart and how well your heart's chambers and valves are working. This procedure takes approximately one hour. There are no restrictions for this procedure. March 2015  Your physician recommends that you return for a FASTING lipid profile: March 2015.  Your physician wants you to follow-up in: 6 months with Dr Aundra Dubin. (July 2015).  You will receive a reminder letter in the mail two months in advance. If you don't receive a letter, please call our office to schedule the follow-up appointment.

## 2013-04-01 NOTE — Progress Notes (Signed)
Patient ID: Sean Brock, male   DOB: 02-01-1935, 79 y.o.   MRN: [id number] PCP: Dr. Marisue Humble  79 yo with history of HTN and aortic stenosis presents for cardiology followup.  Last echo in 3/14 showed moderate AS.  He had a right parietal infarct in 3/14.  He seems to have had a complete recovery, no further significant stroke-related symptoms.  No chest pain.  No exertional dyspnea but not very active.  Weight is down 16 lbs with diet.  Rare palpitations.      ECG: NSR, 1st degree AV block, nonspecific T wave flattening  Labs (3/14): LDL 48, HDL 33 Labs (11/14): K 4.1, 0.79  PMH: 1. HTN 2. Hyperlipidemia 3. Vertigo 4. Aortic stenosis: Mild to moderate.  Echo (4/11) with EF 55-60%, mean gradient 18/peak 34.  Echo (4/13): EF 55-60%, mild to moderate AS, mean gradient 26/peak gradient 53.  Echo (3/14) with EF 60-65%, moderate AS (mean 23 mmHg), mild LVH.  5. Palpitations: Event monitor (5/13) with occasional PVCs, PACs but no significant arrhythmia.  6. Chest pain: Lexiscan myoview (5/13) with EF 70%, no ischemia or infarction.  7. 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  Carotid US 3/14 showed no significant disease.   8. Right inguinal hernia repair in 11/14.   SH: Lives in [place], works as Art gallery manager at [employer].  Married.  Nonsmoker.   FH: Brother with CABG.  Sister with aortic stenosis s/p AVR  ROS: All systems reviewed and negative except as per HPI.   Current Outpatient Prescriptions  Medication Sig Dispense Refill  . amLODipine (NORVASC) 10 MG tablet TAKE ONE TABLET BY MOUTH EVERY DAY  90 tablet  3  . clopidogrel (PLAVIX) 75 MG tablet TAKE ONE TABLET BY MOUTH ONCE DAILY WITH BREAKFAST  30 tablet  0  . omeprazole (PRILOSEC) 20 MG capsule Take 20 mg by mouth daily.      . rosuvastatin (CRESTOR) 10 MG tablet Take 10 mg by mouth daily.      . sildenafil (VIAGRA) 50 MG tablet Take 50 mg by mouth daily as needed for erectile dysfunction.      . Tamsulosin HCl (FLOMAX) 0.4  MG CAPS Take 0.4 mg by mouth at bedtime.       . triamterene-hydrochlorothiazide (DYAZIDE) 37.5-25 MG per capsule Take 1 capsule by mouth every morning.       No current facility-administered medications for this visit.     BP 122/62  Pulse 56  Ht 6\' 3"  (1.905 m)  Wt 87.544 kg (193 lb)  BMI 24.12 kg/m2 General: NAD Neck: No JVD, no thyromegaly or thyroid nodule.  Lungs: Clear to auscultation bilaterally with normal respiratory effort. CV: Nondisplaced PMI.  Heart regular S1/S2, no S3/S4, 3/6 mid-peaking systolic murmur, some muffling of S2.  No peripheral edema.  No carotid bruit.  Normal pedal pulses.  Abdomen: Soft, nontender, no hepatosplenomegaly, no distention.  Neurologic: Alert and oriented x 3.  Psych: Normal affect. Extremities: No clubbing or cyanosis.   Assessment/Plan:  AORTIC STENOSIS  Moderate by echo in 3/14.  S2 is muffled on exam.  Repeat echo in 3/15.  PALPITATIONS Improved since he retired.   Hypertension BP is controlled.  CVA CVA in 3/14.  He has not been evaluated for paroxysmal atrial fibrillation.  I do think that it would be reasonable to get an implanted loop recorder to look for atrial fibrillation.  Hyperlipidemia Check lipids in 3/15. Would like to keep LDL < 70 with history of CVA.  [name] 04/01/2013

## 2013-04-09 ENCOUNTER — Encounter: Payer: Self-pay | Admitting: *Deleted

## 2013-04-09 ENCOUNTER — Ambulatory Visit (INDEPENDENT_AMBULATORY_CARE_PROVIDER_SITE_OTHER): Payer: Medicare Other | Admitting: Internal Medicine

## 2013-04-09 ENCOUNTER — Encounter: Payer: Self-pay | Admitting: Internal Medicine

## 2013-04-09 VITALS — BP 132/78 | HR 60 | Ht 75.0 in | Wt 192.8 lb

## 2013-04-09 DIAGNOSIS — I639 Cerebral infarction, unspecified: Secondary | ICD-10-CM

## 2013-04-09 DIAGNOSIS — I359 Nonrheumatic aortic valve disorder, unspecified: Secondary | ICD-10-CM

## 2013-04-09 DIAGNOSIS — I6350 Cerebral infarction due to unspecified occlusion or stenosis of unspecified cerebral artery: Secondary | ICD-10-CM

## 2013-04-09 DIAGNOSIS — Z01812 Encounter for preprocedural laboratory examination: Secondary | ICD-10-CM

## 2013-04-09 LAB — CBC WITH DIFFERENTIAL/PLATELET
Basophils Absolute: 0.1 10*3/uL (ref 0.0–0.1)
Basophils Relative: 0.7 % (ref 0.0–3.0)
EOS ABS: 0.1 10*3/uL (ref 0.0–0.7)
EOS PCT: 1.6 % (ref 0.0–5.0)
HCT: 42.8 % (ref 39.0–52.0)
HEMOGLOBIN: 14.3 g/dL (ref 13.0–17.0)
LYMPHS PCT: 29.9 % (ref 12.0–46.0)
Lymphs Abs: 2.2 10*3/uL (ref 0.7–4.0)
MCHC: 33.3 g/dL (ref 30.0–36.0)
MCV: 89.7 fl (ref 78.0–100.0)
Monocytes Absolute: 0.5 10*3/uL (ref 0.1–1.0)
Monocytes Relative: 7.3 % (ref 3.0–12.0)
NEUTROS PCT: 60.5 % (ref 43.0–77.0)
Neutro Abs: 4.4 10*3/uL (ref 1.4–7.7)
Platelets: 259 10*3/uL (ref 150.0–400.0)
RBC: 4.77 Mil/uL (ref 4.22–5.81)
RDW: 13.4 % (ref 11.5–14.6)
WBC: 7.3 10*3/uL (ref 4.5–10.5)

## 2013-04-09 LAB — BASIC METABOLIC PANEL
BUN: 22 mg/dL (ref 6–23)
CALCIUM: 9.1 mg/dL (ref 8.4–10.5)
CO2: 30 meq/L (ref 19–32)
CREATININE: 1 mg/dL (ref 0.4–1.5)
Chloride: 102 mEq/L (ref 96–112)
GFR: 80.72 mL/min (ref >60.00)
Glucose, Bld: 98 mg/dL (ref 70–99)
Potassium: 3.8 mEq/L (ref 3.5–5.1)
Sodium: 138 mEq/L (ref 135–145)

## 2013-04-09 NOTE — Progress Notes (Signed)
HPI Sean Brock is referred today by Dr. Aundra Dubin for evaluation of cryptogenic stroke. He experienced a stroke approximately 11 months ago. Subsequent workup demonstrated mild aortic stenosis but was otherwise unremarkable. No syncope. No angina. He has HTN. His only residual from the stroke is hoarseness.  Allergies  Allergen Reactions  . Sudafed [Pseudoephedrine Hcl] Palpitations  . Cardizem [Diltiazem Hcl] Rash     Current Outpatient Prescriptions  Medication Sig Dispense Refill  . amLODipine (NORVASC) 10 MG tablet TAKE ONE TABLET BY MOUTH EVERY DAY  90 tablet  3  . clopidogrel (PLAVIX) 75 MG tablet TAKE ONE TABLET BY MOUTH ONCE DAILY WITH BREAKFAST  30 tablet  0  . omeprazole (PRILOSEC) 20 MG capsule Take 20 mg by mouth daily.      . rosuvastatin (CRESTOR) 10 MG tablet Take 10 mg by mouth daily.      . sildenafil (VIAGRA) 50 MG tablet Take 50 mg by mouth daily as needed for erectile dysfunction.      . Tamsulosin HCl (FLOMAX) 0.4 MG CAPS Take 0.4 mg by mouth at bedtime.       . triamterene-hydrochlorothiazide (DYAZIDE) 37.5-25 MG per capsule Take 1 capsule by mouth every morning.       No current facility-administered medications for this visit.     Past Medical History  Diagnosis Date  . HYPERLIPIDEMIA   . HYPERTENSION   . AORTIC STENOSIS     Mild to moderate. Echo (4/11) with EF 55-60%, mean gradient 18/peak 34;  echo 4/13: severe LVH, EF 55-60%, grade 1 diast dysfxn, mean 26/peak 53 c/w mild to mod AS  . Former smoker   . DYSPEPSIA   . Palpitations   . CAD (coronary artery disease)     LHC 11/1982:  mLAD 40%, pRCA 90%, dRCA 70% diffuse,  PDA occluded, normal LVF - treated medically  . Anginal pain   . Heart murmur   . Shortness of breath   . GERD (gastroesophageal reflux disease)   . Trigeminal neuropathy   . Arthritis     BACK  . Chronic kidney disease   . ED (erectile dysfunction)   . CVA (cerebral infarction)   . Stroke     no deficits    ROS:   All  systems reviewed and negative except as noted in the HPI.   Past Surgical History  Procedure Laterality Date  . Cataract extraction    . Lithotripsy    . Mouth surgery    . Inguinal hernia repair Right 01/15/2013    Procedure: HERNIA REPAIR INGUINAL ADULT;  Surgeon: Harl Bowie, MD;  Location: [facility];  Service: General;  Laterality: Right;  . Insertion of mesh Right 01/15/2013    Procedure: INSERTION OF MESH;  Surgeon: Harl Bowie, MD;  Location: [facility];  Service: General;  Laterality: Right;     Family History  Problem Relation Age of Onset  . Diabetes    . Hypertension    . Lung cancer    . Cancer Sister     breast     History   Social History  . Marital Status: Married    Spouse Name: N/A    Number of Children: N/A  . Years of Education: N/A   Occupational History  . Not on file.   Social History Main Topics  . Smoking status: Former Smoker -- 1.00 packs/day for 30 years    Types: Cigarettes    Quit  date: 08/29/2009  . Smokeless tobacco: Never Used  . Alcohol Use: No  . Drug Use: No  . Sexual Activity: Not on file   Other Topics Concern  . Not on file   Social History Narrative   He resides in [place] with his wife and son. 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  No    grandchildren. He smokes half a pack per day intermittently for the last 40    years. He drinks a glass of homemade wine mixed with diet Coke one time per    week. He denies any drugs, herbal medication, diet, or exercise program.           BP 132/78  Pulse 60  Ht 6\' 3"  (1.905 m)  Wt 192 lb 12.8 oz (87.454 kg)  BMI 24.10 kg/m2  Physical Exam:  Well appearing 79 yo man, NAD HEENT: Unremarkable Neck:  No JVD, no thyromegally Back:  No CVA tenderness Lungs:  Clear with no wheezes HEART:  Regular rate rhythm, no murmurs, no rubs, no clicks Abd:  soft, positive bowel sounds, no organomegally,  no rebound, no guarding Ext:  2 plus pulses, no edema, no cyanosis, no clubbing Skin:  No rashes no nodules Neuro:  CN II through XII intact, motor grossly intact   Assess/Plan:

## 2013-04-09 NOTE — Patient Instructions (Signed)
Your physician recommends that you continue on your current medications as directed. Please refer to the Current Medication list given to you today.  Your physician recommends that you return for lab work today: BMET/CBCD  Your physician recommends that you have a loop recorder placed  scheduled for 2/9 with Dr. Lovena Le.  Your physician recommends that you schedule a follow-up appointment in: 04/24/13 for wound check

## 2013-04-09 NOTE — Assessment & Plan Note (Signed)
He is asymptomatic and his aortic stenosis is mild. Will follow.

## 2013-04-09 NOTE — Assessment & Plan Note (Signed)
I have discussed the indication for insertion of an implantable loop recorder. The risk/benefits/goals/expectations of the procedure were discussed with the patient and he wishes to proceed.

## 2013-04-11 ENCOUNTER — Encounter (HOSPITAL_COMMUNITY): Payer: Self-pay | Admitting: Pharmacy Technician

## 2013-04-14 ENCOUNTER — Ambulatory Visit (HOSPITAL_COMMUNITY)
Admission: RE | Admit: 2013-04-14 | Discharge: 2013-04-14 | Disposition: A | Discharge status: Home / Self Care | Payer: Medicare Other | Source: Ambulatory Visit | Attending: Internal Medicine | Admitting: Internal Medicine

## 2013-04-14 ENCOUNTER — Encounter (HOSPITAL_COMMUNITY)
Admission: RE | Disposition: A | Discharge status: Home / Self Care | Payer: Self-pay | Source: Ambulatory Visit | Attending: Internal Medicine

## 2013-04-14 DIAGNOSIS — I2510 Atherosclerotic heart disease of native coronary artery without angina pectoris: Secondary | ICD-10-CM | POA: Insufficient documentation

## 2013-04-14 DIAGNOSIS — Z8673 Personal history of transient ischemic attack (TIA), and cerebral infarction without residual deficits: Secondary | ICD-10-CM | POA: Insufficient documentation

## 2013-04-14 DIAGNOSIS — Z87891 Personal history of nicotine dependence: Secondary | ICD-10-CM | POA: Insufficient documentation

## 2013-04-14 DIAGNOSIS — I359 Nonrheumatic aortic valve disorder, unspecified: Secondary | ICD-10-CM | POA: Insufficient documentation

## 2013-04-14 DIAGNOSIS — E785 Hyperlipidemia, unspecified: Secondary | ICD-10-CM | POA: Insufficient documentation

## 2013-04-14 DIAGNOSIS — N189 Chronic kidney disease, unspecified: Secondary | ICD-10-CM | POA: Insufficient documentation

## 2013-04-14 DIAGNOSIS — R011 Cardiac murmur, unspecified: Secondary | ICD-10-CM | POA: Insufficient documentation

## 2013-04-14 DIAGNOSIS — G589 Mononeuropathy, unspecified: Secondary | ICD-10-CM | POA: Insufficient documentation

## 2013-04-14 DIAGNOSIS — I10 Essential (primary) hypertension: Secondary | ICD-10-CM | POA: Insufficient documentation

## 2013-04-14 DIAGNOSIS — K219 Gastro-esophageal reflux disease without esophagitis: Secondary | ICD-10-CM | POA: Insufficient documentation

## 2013-04-14 DIAGNOSIS — I129 Hypertensive chronic kidney disease with stage 1 through stage 4 chronic kidney disease, or unspecified chronic kidney disease: Secondary | ICD-10-CM | POA: Insufficient documentation

## 2013-04-14 DIAGNOSIS — I6350 Cerebral infarction due to unspecified occlusion or stenosis of unspecified cerebral artery: Secondary | ICD-10-CM

## 2013-04-14 HISTORY — PX: LOOP RECORDER IMPLANT: SHX5477

## 2013-04-14 SURGERY — LOOP RECORDER IMPLANT
Anesthesia: LOCAL

## 2013-04-14 MED ORDER — ONDANSETRON HCL 4 MG/2ML IJ SOLN: 4.0000 mg | Freq: Four times a day (QID) | INTRAMUSCULAR | Status: DC | PRN

## 2013-04-14 MED ORDER — LIDOCAINE-EPINEPHRINE 1 %-1:100000 IJ SOLN
INTRAMUSCULAR | Status: AC
  Filled 2013-04-14: qty 1

## 2013-04-14 MED ORDER — ACETAMINOPHEN 325 MG PO TABS: 325.0000 mg | ORAL_TABLET | ORAL | Status: DC | PRN

## 2013-04-14 NOTE — Discharge Instructions (Signed)
Cardiac Event Monitoring A cardiac event monitor is a small recording device used to help detect abnormal heart rhythms (arrhythmias). The monitor is used to record heart rhythm when noticeable symptoms such as the following occur:  Fast heart beats (palpitations), such as heart racing or fluttering.  Dizziness.  Fainting or lightheadedness.  Unexplained weakness. The monitor is wired to two electrodes placed on your chest. Electrodes are flat, sticky disks that attach to your skin. The monitor can be worn for up to 30 days. You will wear the monitor at all times, except when bathing.  HOW TO USE YOUR CARDIAC EVENT MONITOR A technician will prepare your chest for the electrode placement. The technician will show you how to place the electrodes, how to work the monitor, and how to replace the batteries. Take time to practice using the monitor before you leave the office. Make sure you understand how to send the information from the monitor to your health care provider. This requires a telephone with a landline, not a cellphone. You need to:  Wear your monitor at all times, except when you are in water:  Do not get the monitor wet.  Take the monitor off when bathing. Do not swim or use a hot tub with it on.  Keep your skin clean. Do not put body lotion or moisturizer on your chest.  Change the electrodes daily or any time they stop sticking to your skin. You might need to use tape to keep them on.  It is possible that your skin under the electrodes could become irritated. To keep this from happening, try to put the electrodes in slightly different places on your chest. However, they must remain in the area under your left breast and in the upper right section of your chest.  Make sure the monitor is safely clipped to your clothing or in a location close to your body that your health care provider recommends.  Press the button to record when you feel symptoms of heart trouble, such as  dizziness, weakness, lightheadedness, palpitations, thumping, shortness of breath, unexplained weakness, or a fluttering or racing heart. The monitor is always on and records what happened slightly before you pressed the button, so do not worry about being too late to get good information.  Keep a diary of your activities, such as walking, doing chores, and taking medicine. It is especially important to note what you were doing when you pushed the button to record your symptoms. This will help your health care provider determine what might be contributing to your symptoms. The information stored in your monitor will be reviewed by your health care provider alongside your diary entries.  Send the recorded information as recommended by your health care provider. It is important to understand that it will take some time for your health care provider to process the results.  Change the batteries as recommended by your health care provider. SEEK IMMEDIATE MEDICAL CARE IF:   You have chest pain.  You have extreme difficulty breathing or shortness of breath.  You develop a very fast heartbeat that persists.  You develop dizziness that does not go away .  You faint or constantly feel you are about to faint. Document Released: 11/30/2007 Document Revised: 10/23/2012 Document Reviewed: 08/19/2012 Exit[employer] Patient Information 2014 [employer].  

## 2013-04-14 NOTE — CV Procedure (Signed)
Electrophysiology procedure note  Procedure: Insertion of a Medtronic implantable loop recorder  Indication: Cryptogenic stroke  Description of the procedure: After informed consent was obtained, the patient was prepped and draped in the usual manner. 20 cc of lidocaine was infiltrated over the left pectoral region. A 1 cm incision was made. The Medtronic implantable loop recorder, serial number [id number], was inserted on the skin. Benzoin and Steri-Strip for pain over the incision. Pressure was held. A bandage was placed. The patient was recovered in the usual manner. The R waves are 0.5.  Complications: No immediate procedural complications.  Conclusion: Successful insertion of a implantable loop recorder in a patient with cryptogenic stroke  Cristopher Peru, M.D.

## 2013-04-14 NOTE — Interval H&P Note (Signed)
History and Physical Interval Note: Since prior clinic visit, no change in the history, physical exam, assessment and plan. 04/14/2013 2:05 PM  Sean Brock  has presented today for surgery, with the diagnosis of Crytogenic stroke  The various methods of treatment have been discussed with the patient and family. After consideration of risks, benefits and other options for treatment, the patient has consented to  Procedure(s): LOOP RECORDER IMPLANT (N/A) as a surgical intervention .  The patient's history has been reviewed, patient examined, no change in status, stable for surgery.  I have reviewed the patient's chart and labs.  Questions were answered to the patient's satisfaction.     Mikle [email or web address].D.

## 2013-04-14 NOTE — H&P (View-Only) (Signed)
    HPI Sean Brock is referred today by Dr. McLean for evaluation of cryptogenic stroke. Sean Brock experienced a stroke approximately 11 months ago. Subsequent workup demonstrated mild aortic stenosis but was otherwise unremarkable. No syncope. No angina. Sean Brock has HTN. His only residual from the stroke is hoarseness.  Allergies  Allergen Reactions  . Sudafed [Pseudoephedrine Hcl] Palpitations  . Cardizem [Diltiazem Hcl] Rash     Current Outpatient Prescriptions  Medication Sig Dispense Refill  . amLODipine (NORVASC) 10 MG tablet TAKE ONE TABLET BY MOUTH EVERY DAY  90 tablet  3  . clopidogrel (PLAVIX) 75 MG tablet TAKE ONE TABLET BY MOUTH ONCE DAILY WITH BREAKFAST  30 tablet  0  . omeprazole (PRILOSEC) 20 MG capsule Take 20 mg by mouth daily.      . rosuvastatin (CRESTOR) 10 MG tablet Take 10 mg by mouth daily.      . sildenafil (VIAGRA) 50 MG tablet Take 50 mg by mouth daily as needed for erectile dysfunction.      . Tamsulosin HCl (FLOMAX) 0.4 MG CAPS Take 0.4 mg by mouth at bedtime.       . triamterene-hydrochlorothiazide (DYAZIDE) 37.5-25 MG per capsule Take 1 capsule by mouth every morning.       No current facility-administered medications for this visit.     Past Medical History  Diagnosis Date  . HYPERLIPIDEMIA   . HYPERTENSION   . AORTIC STENOSIS     Mild to moderate. Echo (4/11) with EF 55-60%, mean gradient 18/peak 34;  echo 4/13: severe LVH, EF 55-60%, grade 1 diast dysfxn, mean 26/peak 53 c/w mild to mod AS  . Former smoker   . DYSPEPSIA   . Palpitations   . CAD (coronary artery disease)     LHC 11/1982:  mLAD 40%, pRCA 90%, dRCA 70% diffuse,  PDA occluded, normal LVF - treated medically  . Anginal pain   . Heart murmur   . Shortness of breath   . GERD (gastroesophageal reflux disease)   . Trigeminal neuropathy   . Arthritis     BACK  . Chronic kidney disease   . ED (erectile dysfunction)   . CVA (cerebral infarction)   . Stroke     no deficits    ROS:   All  systems reviewed and negative except as noted in the HPI.   Past Surgical History  Procedure Laterality Date  . Cataract extraction    . Lithotripsy    . Mouth surgery    . Inguinal hernia repair Right 01/15/2013    Procedure: HERNIA REPAIR INGUINAL ADULT;  Surgeon: Douglas A Blackman, MD;  Location: [facility];  Service: General;  Laterality: Right;  . Insertion of mesh Right 01/15/2013    Procedure: INSERTION OF MESH;  Surgeon: Douglas A Blackman, MD;  Location: [facility];  Service: General;  Laterality: Right;     Family History  Problem Relation Age of Onset  . Diabetes    . Hypertension    . Lung cancer    . Cancer Sister     breast     History   Social History  . Marital Status: Married    Spouse Name: N/A    Number of Children: N/A  . Years of Education: N/A   Occupational History  . Not on file.   Social History Main Topics  . Smoking status: Former Smoker -- 1.00 packs/day for 30 years    Types: Cigarettes    Quit   date: 08/29/2009  . Smokeless tobacco: Never Used  . Alcohol Use: No  . Drug Use: No  . Sexual Activity: Not on file   Other Topics Concern  . Not on file   Social History Narrative   Sean Brock resides in [place] with his wife and son. 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 Sean Brock has one son and one daughter.  No    grandchildren. Sean Brock smokes half a pack per day intermittently for the last 40    years. Sean Brock drinks a glass of homemade wine mixed with diet Coke one time per    week. Sean Brock denies any drugs, herbal medication, diet, or exercise program.           BP 132/78  Pulse 60  Ht 6\' 3"  (1.905 m)  Wt 192 lb 12.8 oz (87.454 kg)  BMI 24.10 kg/m2  Physical Exam:  Well appearing 79 yo man, NAD HEENT: Unremarkable Neck:  No JVD, no thyromegally Back:  No CVA tenderness Lungs:  Clear with no wheezes HEART:  Regular rate rhythm, no murmurs, no rubs, no clicks Abd:  soft, positive bowel sounds, no organomegally,  no rebound, no guarding Ext:  2 plus pulses, no edema, no cyanosis, no clubbing Skin:  No rashes no nodules Neuro:  CN II through XII intact, motor grossly intact   Assess/Plan:

## 2013-04-15 HISTORY — PX: LOOP RECORDER IMPLANT: SHX5954

## 2013-04-22 ENCOUNTER — Encounter (HOSPITAL_COMMUNITY): Payer: Self-pay | Admitting: *Deleted

## 2013-04-24 ENCOUNTER — Ambulatory Visit (INDEPENDENT_AMBULATORY_CARE_PROVIDER_SITE_OTHER): Payer: Medicare Other | Admitting: *Deleted

## 2013-04-24 DIAGNOSIS — I6350 Cerebral infarction due to unspecified occlusion or stenosis of unspecified cerebral artery: Secondary | ICD-10-CM

## 2013-04-24 DIAGNOSIS — I639 Cerebral infarction, unspecified: Secondary | ICD-10-CM

## 2013-04-24 LAB — MDC_IDC_ENUM_SESS_TYPE_INCLINIC
MDC IDC SESS DTM: 20150219115015
MDC IDC SET ZONE DETECTION INTERVAL: 3000 ms
Zone Setting Detection Interval: 2000 ms
Zone Setting Detection Interval: 390 ms

## 2013-04-24 NOTE — Progress Notes (Signed)
Wound check in clinic s/p loop implant for cryptogenic CVA. Wound well healed without redness or edema. Pt with 0 tachy episodes; 0 brady episodes; 0 asystole; 1 symptom episode recorded as a demo on day of implant. Patient states that he has not had any symptoms and has not had to use the activator since. Patient will follow up via [facility] monthly and with [facility] prn.

## 2013-04-30 ENCOUNTER — Other Ambulatory Visit: Payer: Self-pay | Admitting: Cardiology

## 2013-05-02 ENCOUNTER — Encounter: Payer: Self-pay | Admitting: Internal Medicine

## 2013-05-16 ENCOUNTER — Ambulatory Visit (INDEPENDENT_AMBULATORY_CARE_PROVIDER_SITE_OTHER): Payer: Medicare Other | Admitting: *Deleted

## 2013-05-16 DIAGNOSIS — I6350 Cerebral infarction due to unspecified occlusion or stenosis of unspecified cerebral artery: Secondary | ICD-10-CM

## 2013-05-16 DIAGNOSIS — I639 Cerebral infarction, unspecified: Secondary | ICD-10-CM

## 2013-05-26 ENCOUNTER — Other Ambulatory Visit (INDEPENDENT_AMBULATORY_CARE_PROVIDER_SITE_OTHER): Payer: Medicare Other

## 2013-05-26 ENCOUNTER — Ambulatory Visit (HOSPITAL_COMMUNITY): Payer: Medicare Other | Attending: Cardiology | Admitting: Radiology

## 2013-05-26 DIAGNOSIS — I359 Nonrheumatic aortic valve disorder, unspecified: Secondary | ICD-10-CM

## 2013-05-26 DIAGNOSIS — I10 Essential (primary) hypertension: Secondary | ICD-10-CM

## 2013-05-26 DIAGNOSIS — E785 Hyperlipidemia, unspecified: Secondary | ICD-10-CM

## 2013-05-26 LAB — LIPID PANEL
CHOL/HDL RATIO: 3
Cholesterol: 113 mg/dL (ref 0–200)
HDL: 37.6 mg/dL — ABNORMAL LOW (ref >39.00)
LDL CALC: 51 mg/dL (ref 0–99)
Triglycerides: 123 mg/dL (ref 0.0–149.0)
VLDL: 24.6 mg/dL (ref 0.0–40.0)

## 2013-05-26 NOTE — Progress Notes (Signed)
Echocardiogram performed.  

## 2013-06-03 ENCOUNTER — Encounter: Payer: Self-pay | Admitting: Cardiology

## 2013-06-03 ENCOUNTER — Ambulatory Visit (INDEPENDENT_AMBULATORY_CARE_PROVIDER_SITE_OTHER): Payer: Medicare Other | Admitting: Cardiology

## 2013-06-03 ENCOUNTER — Encounter: Payer: Self-pay | Admitting: *Deleted

## 2013-06-03 VITALS — BP 143/80 | HR 62 | Ht 75.0 in | Wt 193.0 lb

## 2013-06-03 DIAGNOSIS — E785 Hyperlipidemia, unspecified: Secondary | ICD-10-CM

## 2013-06-03 DIAGNOSIS — I359 Nonrheumatic aortic valve disorder, unspecified: Secondary | ICD-10-CM

## 2013-06-03 DIAGNOSIS — I6350 Cerebral infarction due to unspecified occlusion or stenosis of unspecified cerebral artery: Secondary | ICD-10-CM

## 2013-06-03 DIAGNOSIS — I639 Cerebral infarction, unspecified: Secondary | ICD-10-CM

## 2013-06-03 DIAGNOSIS — I10 Essential (primary) hypertension: Secondary | ICD-10-CM

## 2013-06-03 DIAGNOSIS — R002 Palpitations: Secondary | ICD-10-CM

## 2013-06-03 DIAGNOSIS — I350 Nonrheumatic aortic (valve) stenosis: Secondary | ICD-10-CM

## 2013-06-03 NOTE — Progress Notes (Signed)
Patient ID: Sean Brock, male   DOB: [birth year], 79 y.o.   MRN: [id number] PCP: Dr. Marisue Humble  79 yo with history of HTN and aortic stenosis presents for cardiology followup.  He had a right parietal infarct in 3/14.  He seems to have had a complete recovery, no further significant stroke-related symptoms.  He had a loop recorder placed to monitor for atrial fibrillation.  No chest pain.  He is here with his wife today.  They both have noted a significant decline in his stamina over the last year.  He tires more easily.  He is short of breath walking up a hill.  He has "dizziness" that sounds vertiginous if he moves his head "fast" from one side to the other.  I do not think this is orthostatic-type lightheadedness by his description.  After last appointment, I had him get a repeat echo.  The echo in 3/15 showed severe aortic stenosis with mean gradient 40 mmHg /peak gradient 61 mmHg, AVA 0.88 cm^2.   Labs (3/14): LDL 48, HDL 33 Labs (11/14): K 4.1, 0.79 Labs (2/15): K 3.8, creatinine 1.0 Labs (3/15): LDL 51, HDL 38  PMH: 1. HTN 2. Hyperlipidemia 3. Vertigo 4. Aortic stenosis: Mild to moderate.  Echo (4/11) with EF 55-60%, mean gradient 18/peak 34.  Echo (4/13): EF 55-60%, mild to moderate AS, mean gradient 26/peak gradient 53.  Echo (3/14) with EF 60-65%, moderate AS (mean 23 mmHg), mild LVH.  Echo (3/15) with EF 65-70%, moderate LVH, severe AS with mean gradient 40 mmHg/peak 61 mmHg, AVA 0.88 cm^2.  5. Palpitations: Event monitor (5/13) with occasional PVCs, PACs but no significant arrhythmia.  6. Chest pain: Lexiscan myoview (5/13) with EF 70%, no ischemia or infarction.  7. 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  Carotid US 3/14 showed no significant disease.  He has an implanted loop recorder to look for atrial fibrillation.  8. Right inguinal hernia repair in 11/14.   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  Married.  Nonsmoker.   FH: Brother with  CABG.  Sister with aortic stenosis s/p AVR  ROS: All systems reviewed and negative except as per HPI.   Current Outpatient Prescriptions  Medication Sig Dispense Refill  . amLODipine (NORVASC) 10 MG tablet Take 10 mg by mouth daily.      . clopidogrel (PLAVIX) 75 MG tablet Take 75 mg by mouth daily with breakfast.      . Multiple Vitamin (MULTIVITAMIN WITH MINERALS) TABS tablet Take 1 tablet by mouth daily.      Marland Kitchen omeprazole (PRILOSEC) 20 MG capsule Take 20 mg by mouth daily as needed (for acid reduction).       Vladimir Faster Glycol-Propyl Glycol (SYSTANE OP) Apply 1 drop to eye daily as needed (for dry eyes).      . rosuvastatin (CRESTOR) 10 MG tablet Take 10 mg by mouth daily.      . Tamsulosin HCl (FLOMAX) 0.4 MG CAPS Take 0.4 mg by mouth at bedtime.       . triamterene-hydrochlorothiazide (DYAZIDE) 37.5-25 MG per capsule Take 1 capsule by mouth every morning.       No current facility-administered medications for this visit.     BP 143/80  Pulse 62  Ht 6\' 3"  (1.905 m)  Wt 87.544 kg (193 lb)  BMI 24.12 kg/m2 General: NAD Neck: No JVD, no thyromegaly or thyroid nodule.  Lungs: Clear to auscultation bilaterally with normal respiratory effort. CV: Nondisplaced PMI.  Heart regular S1/S2, no S3/S4, 3/6 mid-peaking systolic murmur, muffling of S2.  No peripheral edema.  No carotid bruit.  Normal pedal pulses.  Abdomen: Soft, nontender, no hepatosplenomegaly, no distention.  Neurologic: Alert and oriented x 3.  Psych: Normal affect. Extremities: No clubbing or cyanosis.   Assessment/Plan:  AORTIC STENOSIS  Severe aortic stenosis on most recent echo. I think he is symptomatic: he has noted a significant, though not marked, fall in his stamina over the last year with significant exertional fatigue.   - I will have him come in Friday for right and left heart cath.  - I will refer him after cath to [facility] for bioprosthetic aortic valve replacement.   PALPITATIONS Improved since he  retired.   Hypertension BP is controlled.  CVA CVA in 3/14.  He has an implanted loop recorder now to monitor for paroxysmal atrial fibrillation.  Hyperlipidemia Good lipids in 3/15. Would like to keep LDL < 70 with history of CVA.  Continue Crestor.   [name] 06/03/2013

## 2013-06-03 NOTE — Patient Instructions (Signed)
You will need lab work today: bmp,cbc, INR   Your physician has requested that you have a cardiac catheterization. Cardiac catheterization is used to diagnose and/or treat various heart conditions. Doctors may recommend this procedure for a number of different reasons. The most common reason is to evaluate chest pain. Chest pain can be a symptom of coronary artery disease (CAD), and cardiac catheterization can show whether plaque is narrowing or blocking your heart's arteries. This procedure is also used to evaluate the valves, as well as measure the blood flow and oxygen levels in different parts of your heart. For further information please visit [email or web address]. Please follow instruction sheet, as given.

## 2013-06-04 ENCOUNTER — Encounter: Payer: Self-pay | Admitting: *Deleted

## 2013-06-04 ENCOUNTER — Encounter (HOSPITAL_COMMUNITY): Payer: Self-pay | Admitting: Pharmacy Technician

## 2013-06-04 LAB — CBC WITH DIFFERENTIAL/PLATELET
Basophils Absolute: 0 10*3/uL (ref 0.0–0.1)
Basophils Relative: 0.5 % (ref 0.0–3.0)
EOS PCT: 1.6 % (ref 0.0–5.0)
Eosinophils Absolute: 0.1 10*3/uL (ref 0.0–0.7)
HCT: 42.6 % (ref 39.0–52.0)
Hemoglobin: 14.4 g/dL (ref 13.0–17.0)
LYMPHS PCT: 30 % (ref 12.0–46.0)
Lymphs Abs: 2.1 10*3/uL (ref 0.7–4.0)
MCHC: 33.7 g/dL (ref 30.0–36.0)
MCV: 88.2 fl (ref 78.0–100.0)
MONOS PCT: 6.9 % (ref 3.0–12.0)
Monocytes Absolute: 0.5 10*3/uL (ref 0.1–1.0)
Neutro Abs: 4.2 10*3/uL (ref 1.4–7.7)
Neutrophils Relative %: 61 % (ref 43.0–77.0)
PLATELETS: 226 10*3/uL (ref 150.0–400.0)
RBC: 4.84 Mil/uL (ref 4.22–5.81)
RDW: 13.6 % (ref 11.5–14.6)
WBC: 6.9 10*3/uL (ref 4.5–10.5)

## 2013-06-04 LAB — BASIC METABOLIC PANEL
BUN: 17 mg/dL (ref 6–23)
CO2: 26 mEq/L (ref 19–32)
Calcium: 9 mg/dL (ref 8.4–10.5)
Chloride: 102 mEq/L (ref 96–112)
Creatinine, Ser: 1 mg/dL (ref 0.4–1.5)
GFR: 81.66 mL/min (ref >60.00)
GLUCOSE: 90 mg/dL (ref 70–99)
Potassium: 3.4 mEq/L — ABNORMAL LOW (ref 3.5–5.1)
Sodium: 137 mEq/L (ref 135–145)

## 2013-06-04 LAB — PROTIME-INR
INR: 1.2 ratio — AB (ref 0.8–1.0)
Prothrombin Time: 12.8 s — ABNORMAL HIGH (ref 10.2–12.4)

## 2013-06-04 NOTE — Telephone Encounter (Signed)
Opened in error

## 2013-06-04 NOTE — Progress Notes (Signed)
Hit reviewed button in error and deleted from Dr Oleh Genin basket, will forward to him for review

## 2013-06-04 NOTE — Progress Notes (Signed)
Reviewed button hit by mistake and removed from Dr Oleh Genin basket for him to review labs. Will forward to him for review

## 2013-06-06 ENCOUNTER — Other Ambulatory Visit (HOSPITAL_COMMUNITY): Payer: Self-pay | Admitting: Cardiology

## 2013-06-06 ENCOUNTER — Encounter (HOSPITAL_COMMUNITY)
Admission: RE | Disposition: A | Discharge status: Home / Self Care | Payer: Self-pay | Source: Ambulatory Visit | Attending: Cardiology

## 2013-06-06 ENCOUNTER — Ambulatory Visit (HOSPITAL_COMMUNITY)
Admission: RE | Admit: 2013-06-06 | Discharge: 2013-06-06 | Disposition: A | Discharge status: Home / Self Care | Payer: Medicare Other | Source: Ambulatory Visit | Attending: Cardiology | Admitting: Cardiology

## 2013-06-06 DIAGNOSIS — I359 Nonrheumatic aortic valve disorder, unspecified: Secondary | ICD-10-CM

## 2013-06-06 DIAGNOSIS — I350 Nonrheumatic aortic (valve) stenosis: Secondary | ICD-10-CM

## 2013-06-06 DIAGNOSIS — I10 Essential (primary) hypertension: Secondary | ICD-10-CM | POA: Insufficient documentation

## 2013-06-06 DIAGNOSIS — Z8673 Personal history of transient ischemic attack (TIA), and cerebral infarction without residual deficits: Secondary | ICD-10-CM | POA: Insufficient documentation

## 2013-06-06 DIAGNOSIS — I2510 Atherosclerotic heart disease of native coronary artery without angina pectoris: Secondary | ICD-10-CM | POA: Insufficient documentation

## 2013-06-06 DIAGNOSIS — R002 Palpitations: Secondary | ICD-10-CM | POA: Insufficient documentation

## 2013-06-06 DIAGNOSIS — R079 Chest pain, unspecified: Secondary | ICD-10-CM | POA: Insufficient documentation

## 2013-06-06 DIAGNOSIS — E785 Hyperlipidemia, unspecified: Secondary | ICD-10-CM | POA: Insufficient documentation

## 2013-06-06 DIAGNOSIS — Z7902 Long term (current) use of antithrombotics/antiplatelets: Secondary | ICD-10-CM | POA: Insufficient documentation

## 2013-06-06 HISTORY — PX: LEFT AND RIGHT HEART CATHETERIZATION WITH CORONARY ANGIOGRAM: SHX5449

## 2013-06-06 LAB — POCT I-STAT 3, ART BLOOD GAS (G3+)
ACID-BASE EXCESS: 1 mmol/L (ref 0.0–2.0)
BICARBONATE: 24.4 meq/L — AB (ref 20.0–24.0)
O2 Saturation: 96 %
PH ART: 7.459 — AB (ref 7.350–7.450)
TCO2: 25 mmol/L (ref 0–100)
pCO2 arterial: 34.3 mmHg — ABNORMAL LOW (ref 35.0–45.0)
pO2, Arterial: 74 mmHg — ABNORMAL LOW (ref 80.0–100.0)

## 2013-06-06 LAB — POTASSIUM: POTASSIUM: 3.7 meq/L (ref 3.7–5.3)

## 2013-06-06 LAB — POCT I-STAT 3, VENOUS BLOOD GAS (G3P V)
Acid-Base Excess: 3 mmol/L — ABNORMAL HIGH (ref 0.0–2.0)
Bicarbonate: 26.5 mEq/L — ABNORMAL HIGH (ref 20.0–24.0)
O2 SAT: 73 %
PCO2 VEN: 35.5 mmHg — AB (ref 45.0–50.0)
PH VEN: 7.481 — AB (ref 7.250–7.300)
TCO2: 28 mmol/L (ref 0–100)
pO2, Ven: 35 mmHg (ref 30.0–45.0)

## 2013-06-06 LAB — POCT ACTIVATED CLOTTING TIME: Activated Clotting Time: 177 seconds

## 2013-06-06 SURGERY — LEFT AND RIGHT HEART CATHETERIZATION WITH CORONARY ANGIOGRAM
Anesthesia: LOCAL

## 2013-06-06 MED ORDER — SODIUM CHLORIDE 0.9 % IJ SOLN: 3.0000 mL | INTRAMUSCULAR | Status: DC | PRN

## 2013-06-06 MED ORDER — MIDAZOLAM HCL 2 MG/2ML IJ SOLN
INTRAMUSCULAR | Status: AC
  Filled 2013-06-06: qty 2

## 2013-06-06 MED ORDER — ASPIRIN 81 MG PO CHEW
81.0000 mg | CHEWABLE_TABLET | ORAL | Status: AC
  Administered 2013-06-06: 81 mg via ORAL
  Filled 2013-06-06: qty 1

## 2013-06-06 MED ORDER — ACETAMINOPHEN 325 MG PO TABS: 650.0000 mg | ORAL_TABLET | ORAL | Status: DC | PRN

## 2013-06-06 MED ORDER — SODIUM CHLORIDE 0.9 % IV SOLN: INTRAVENOUS | Status: DC

## 2013-06-06 MED ORDER — SODIUM CHLORIDE 0.9 % IJ SOLN: 3.0000 mL | Freq: Two times a day (BID) | INTRAMUSCULAR | Status: DC

## 2013-06-06 MED ORDER — NITROGLYCERIN 0.2 MG/ML ON CALL CATH LAB
INTRAVENOUS | Status: AC
  Filled 2013-06-06: qty 1

## 2013-06-06 MED ORDER — SODIUM CHLORIDE 0.9 % IV SOLN: 250.0000 mL | INTRAVENOUS | Status: DC | PRN

## 2013-06-06 MED ORDER — FENTANYL CITRATE 0.05 MG/ML IJ SOLN
INTRAMUSCULAR | Status: AC
  Filled 2013-06-06: qty 2

## 2013-06-06 MED ORDER — VERAPAMIL HCL 2.5 MG/ML IV SOLN
INTRAVENOUS | Status: AC
  Filled 2013-06-06: qty 2

## 2013-06-06 MED ORDER — HEPARIN (PORCINE) IN NACL 2-0.9 UNIT/ML-% IJ SOLN
INTRAMUSCULAR | Status: AC
  Filled 2013-06-06: qty 1000

## 2013-06-06 MED ORDER — SODIUM CHLORIDE 0.9 % IV SOLN
INTRAVENOUS | Status: DC
  Administered 2013-06-06: 07:00:00 via INTRAVENOUS

## 2013-06-06 MED ORDER — HEPARIN SODIUM (PORCINE) 1000 UNIT/ML IJ SOLN
INTRAMUSCULAR | Status: AC
  Filled 2013-06-06: qty 1

## 2013-06-06 MED ORDER — ONDANSETRON HCL 4 MG/2ML IJ SOLN: 4.0000 mg | Freq: Four times a day (QID) | INTRAMUSCULAR | Status: DC | PRN

## 2013-06-06 MED ORDER — POTASSIUM CHLORIDE CRYS ER 20 MEQ PO TBCR
20.0000 meq | EXTENDED_RELEASE_TABLET | Freq: Once | ORAL | Status: AC
  Administered 2013-06-06: 20 meq via ORAL
  Filled 2013-06-06: qty 1

## 2013-06-06 MED ORDER — LIDOCAINE HCL (PF) 1 % IJ SOLN
INTRAMUSCULAR | Status: AC
  Filled 2013-06-06: qty 30

## 2013-06-06 NOTE — H&P (View-Only) (Signed)
Patient ID: Sean Brock, male   DOB: [birth year], 79 y.o.   MRN: [id number] PCP: Dr. Marisue Humble  79 yo with history of HTN and aortic stenosis presents for cardiology followup.  He had a right parietal infarct in 3/14.  He seems to have had a complete recovery, no further significant stroke-related symptoms.  He had a loop recorder placed to monitor for atrial fibrillation.  No chest pain.  He is here with his wife today.  They both have noted a significant decline in his stamina over the last year.  He tires more easily.  He is short of breath walking up a hill.  He has "dizziness" that sounds vertiginous if he moves his head "fast" from one side to the other.  I do not think this is orthostatic-type lightheadedness by his description.  After last appointment, I had him get a repeat echo.  The echo in 3/15 showed severe aortic stenosis with mean gradient 40 mmHg /peak gradient 61 mmHg, AVA 0.88 cm^2.   Labs (3/14): LDL 48, HDL 33 Labs (11/14): K 4.1, 0.79 Labs (2/15): K 3.8, creatinine 1.0 Labs (3/15): LDL 51, HDL 38  PMH: 1. HTN 2. Hyperlipidemia 3. Vertigo 4. Aortic stenosis: Mild to moderate.  Echo (4/11) with EF 55-60%, mean gradient 18/peak 34.  Echo (4/13): EF 55-60%, mild to moderate AS, mean gradient 26/peak gradient 53.  Echo (3/14) with EF 60-65%, moderate AS (mean 23 mmHg), mild LVH.  Echo (3/15) with EF 65-70%, moderate LVH, severe AS with mean gradient 40 mmHg/peak 61 mmHg, AVA 0.88 cm^2.  5. Palpitations: Event monitor (5/13) with occasional PVCs, PACs but no significant arrhythmia.  6. Chest pain: Lexiscan myoview (5/13) with EF 70%, no ischemia or infarction.  7. 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  Carotid US 3/14 showed no significant disease.  He has an implanted loop recorder to look for atrial fibrillation.  8. Right inguinal hernia repair in 11/14.   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  Married.  Nonsmoker.   FH: Brother with  CABG.  Sister with aortic stenosis s/p AVR  ROS: All systems reviewed and negative except as per HPI.   Current Outpatient Prescriptions  Medication Sig Dispense Refill  . amLODipine (NORVASC) 10 MG tablet Take 10 mg by mouth daily.      . clopidogrel (PLAVIX) 75 MG tablet Take 75 mg by mouth daily with breakfast.      . Multiple Vitamin (MULTIVITAMIN WITH MINERALS) TABS tablet Take 1 tablet by mouth daily.      Marland Kitchen omeprazole (PRILOSEC) 20 MG capsule Take 20 mg by mouth daily as needed (for acid reduction).       Vladimir Faster Glycol-Propyl Glycol (SYSTANE OP) Apply 1 drop to eye daily as needed (for dry eyes).      . rosuvastatin (CRESTOR) 10 MG tablet Take 10 mg by mouth daily.      . Tamsulosin HCl (FLOMAX) 0.4 MG CAPS Take 0.4 mg by mouth at bedtime.       . triamterene-hydrochlorothiazide (DYAZIDE) 37.5-25 MG per capsule Take 1 capsule by mouth every morning.       No current facility-administered medications for this visit.     BP 143/80  Pulse 62  Ht 6\' 3"  (1.905 m)  Wt 87.544 kg (193 lb)  BMI 24.12 kg/m2 General: NAD Neck: No JVD, no thyromegaly or thyroid nodule.  Lungs: Clear to auscultation bilaterally with normal respiratory effort. CV: Nondisplaced PMI.  Heart regular S1/S2, no S3/S4, 3/6 mid-peaking systolic murmur, muffling of S2.  No peripheral edema.  No carotid bruit.  Normal pedal pulses.  Abdomen: Soft, nontender, no hepatosplenomegaly, no distention.  Neurologic: Alert and oriented x 3.  Psych: Normal affect. Extremities: No clubbing or cyanosis.   Assessment/Plan:  AORTIC STENOSIS  Severe aortic stenosis on most recent echo. I think he is symptomatic: he has noted a significant, though not marked, fall in his stamina over the last year with significant exertional fatigue.   - I will have him come in Friday for right and left heart cath.  - I will refer him after cath to CVTS for bioprosthetic aortic valve replacement.   PALPITATIONS Improved since he  retired.   Hypertension BP is controlled.  CVA CVA in 3/14.  He has an implanted loop recorder now to monitor for paroxysmal atrial fibrillation.  Hyperlipidemia Good lipids in 3/15. Would like to keep LDL < 70 with history of CVA.  Continue Crestor.   [name] 06/03/2013

## 2013-06-06 NOTE — CV Procedure (Signed)
   Cardiac Catheterization Procedure Note  Name: Sean Brock MRN: [id number] DOB: 1934-07-07  Procedure: Right Heart Cath,Selective Coronary Angiography  Indication: Severe aortic stenosis   Procedural Details: Allen's test was positive on the right. There was an IV in a brachial vein.  This was replaced with a 5 French venous sheath.  A Swan-Ganz catheter was used for the right heart catheterization. Standard protocol was followed for recording of right heart pressures and sampling of oxygen saturations. Fick cardiac output was calculated. The right rdial area was sterilely prepped and draped.  A 5 French sheath was placed in the right radial artery using modificed Seldinger technique.  The patient received 3 mg IA verapamil and weight-based IV heparin.  The right radial Standard Judkins catheters were used for selective coronary angiography and left ventriculography. There were no immediate procedural complications. The patient was transferred to the post catheterization recovery area for further monitoring.  Procedural Findings: Hemodynamics (mmHg) RA mean 1 RV 30/5 PA 23/10 PCWP mean 9 AO 129/63  Oxygen saturations: PA 73% AO 96%  Cardiac Output (Fick) 6.35  Cardiac Index (Fick) 2.95   Coronary angiography: Coronary dominance: right  Very tortuous vessels  Left mainstem: 30% distal LM.  Left anterior descending (LAD): 40-50% ostial LAD stenosis. 30% proximal LAD stenosis at D1 (focal).  Moderate D1 with 80-90% ostial stenosis.   Left circumflex (LCx): 40% proximal LCx after OM1.  OM1 moderate, very tortuous but probably no significant stenosis.   Right coronary artery (RCA): Total occlusion with left to right collaterals.   Left ventriculography: Not done, known severe AS.   Final Conclusions:  Low to normal filling pressures.  Patient has a totally occluded RCA with collaterals.  He says that this was seen on prior cardiac cath about 20 years ago.  He has disease in  the LCA system but nonobstructive.  Will refer to CVTS for AVR with severe AS.   [name] 06/06/2013, 9:00 AM

## 2013-06-06 NOTE — Interval H&P Note (Signed)
Cath Lab Visit (complete for each Cath Lab visit)  Clinical Evaluation Leading to the Procedure:   ACS: no  Non-ACS:    Anginal Classification: CCS II  Anti-ischemic medical therapy: Minimal Therapy (1 class of medications)  Non-Invasive Test Results: No non-invasive testing performed  Prior CABG: No previous CABG      History and Physical Interval Note:  06/06/2013 8:03 AM  Sean Brock  has presented today for surgery, with the diagnosis of Severe Aortic Stenosis  The various methods of treatment have been discussed with the patient and family. After consideration of risks, benefits and other options for treatment, the patient has consented to  Procedure(s): LEFT AND RIGHT HEART CATHETERIZATION WITH CORONARY ANGIOGRAM (N/A) as a surgical intervention .  The patient's history has been reviewed, patient examined, no change in status, stable for surgery.  I have reviewed the patient's chart and labs.  Questions were answered to the patient's satisfaction.     Bettie Capistran [employer]

## 2013-06-06 NOTE — Discharge Instructions (Signed)

## 2013-06-12 ENCOUNTER — Other Ambulatory Visit: Payer: Self-pay

## 2013-06-12 ENCOUNTER — Encounter (HOSPITAL_COMMUNITY): Payer: Self-pay | Admitting: Emergency Medicine

## 2013-06-12 ENCOUNTER — Observation Stay (HOSPITAL_COMMUNITY)
Admission: EM | Admit: 2013-06-12 | Discharge: 2013-06-13 | Disposition: A | Discharge status: Home / Self Care | Payer: Medicare Other | Attending: Cardiology | Admitting: Cardiology

## 2013-06-12 ENCOUNTER — Emergency Department (HOSPITAL_COMMUNITY): Payer: Medicare Other

## 2013-06-12 DIAGNOSIS — I639 Cerebral infarction, unspecified: Secondary | ICD-10-CM | POA: Diagnosis present

## 2013-06-12 DIAGNOSIS — N189 Chronic kidney disease, unspecified: Secondary | ICD-10-CM | POA: Insufficient documentation

## 2013-06-12 DIAGNOSIS — R011 Cardiac murmur, unspecified: Secondary | ICD-10-CM | POA: Insufficient documentation

## 2013-06-12 DIAGNOSIS — Z87891 Personal history of nicotine dependence: Secondary | ICD-10-CM | POA: Insufficient documentation

## 2013-06-12 DIAGNOSIS — R1013 Epigastric pain: Secondary | ICD-10-CM

## 2013-06-12 DIAGNOSIS — R079 Chest pain, unspecified: Secondary | ICD-10-CM

## 2013-06-12 DIAGNOSIS — K3189 Other diseases of stomach and duodenum: Secondary | ICD-10-CM | POA: Insufficient documentation

## 2013-06-12 DIAGNOSIS — G509 Disorder of trigeminal nerve, unspecified: Secondary | ICD-10-CM | POA: Insufficient documentation

## 2013-06-12 DIAGNOSIS — I359 Nonrheumatic aortic valve disorder, unspecified: Secondary | ICD-10-CM

## 2013-06-12 DIAGNOSIS — I350 Nonrheumatic aortic (valve) stenosis: Secondary | ICD-10-CM | POA: Diagnosis present

## 2013-06-12 DIAGNOSIS — E785 Hyperlipidemia, unspecified: Secondary | ICD-10-CM | POA: Insufficient documentation

## 2013-06-12 DIAGNOSIS — R42 Dizziness and giddiness: Secondary | ICD-10-CM | POA: Insufficient documentation

## 2013-06-12 DIAGNOSIS — Z8673 Personal history of transient ischemic attack (TIA), and cerebral infarction without residual deficits: Secondary | ICD-10-CM | POA: Insufficient documentation

## 2013-06-12 DIAGNOSIS — Z79899 Other long term (current) drug therapy: Secondary | ICD-10-CM | POA: Insufficient documentation

## 2013-06-12 DIAGNOSIS — N529 Male erectile dysfunction, unspecified: Secondary | ICD-10-CM | POA: Insufficient documentation

## 2013-06-12 DIAGNOSIS — R55 Syncope and collapse: Principal | ICD-10-CM | POA: Insufficient documentation

## 2013-06-12 DIAGNOSIS — I2510 Atherosclerotic heart disease of native coronary artery without angina pectoris: Secondary | ICD-10-CM | POA: Insufficient documentation

## 2013-06-12 DIAGNOSIS — R001 Bradycardia, unspecified: Secondary | ICD-10-CM

## 2013-06-12 DIAGNOSIS — R0602 Shortness of breath: Secondary | ICD-10-CM | POA: Insufficient documentation

## 2013-06-12 DIAGNOSIS — I129 Hypertensive chronic kidney disease with stage 1 through stage 4 chronic kidney disease, or unspecified chronic kidney disease: Secondary | ICD-10-CM | POA: Insufficient documentation

## 2013-06-12 DIAGNOSIS — I10 Essential (primary) hypertension: Secondary | ICD-10-CM | POA: Diagnosis present

## 2013-06-12 DIAGNOSIS — K219 Gastro-esophageal reflux disease without esophagitis: Secondary | ICD-10-CM | POA: Diagnosis present

## 2013-06-12 DIAGNOSIS — M129 Arthropathy, unspecified: Secondary | ICD-10-CM | POA: Insufficient documentation

## 2013-06-12 HISTORY — DX: Bradycardia, unspecified: R00.1

## 2013-06-12 HISTORY — DX: Dizziness and giddiness: R42

## 2013-06-12 LAB — CBC
HCT: 43.2 % (ref 39.0–52.0)
Hemoglobin: 15.2 g/dL (ref 13.0–17.0)
MCH: 30.8 pg (ref 26.0–34.0)
MCHC: 35.2 g/dL (ref 30.0–36.0)
MCV: 87.4 fL (ref 78.0–100.0)
PLATELETS: 204 10*3/uL (ref 150–400)
RBC: 4.94 MIL/uL (ref 4.22–5.81)
RDW: 13.3 % (ref 11.5–15.5)
WBC: 7.6 10*3/uL (ref 4.0–10.5)

## 2013-06-12 LAB — BASIC METABOLIC PANEL
BUN: 22 mg/dL (ref 6–23)
CHLORIDE: 99 meq/L (ref 96–112)
CO2: 23 meq/L (ref 19–32)
Calcium: 9.5 mg/dL (ref 8.4–10.5)
Creatinine, Ser: 0.86 mg/dL (ref 0.50–1.35)
GFR calc Af Amer: >90 mL/min (ref >90)
GFR calc non Af Amer: 82 mL/min — ABNORMAL LOW (ref >90)
Glucose, Bld: 116 mg/dL — ABNORMAL HIGH (ref 70–99)
POTASSIUM: 3.6 meq/L — AB (ref 3.7–5.3)
SODIUM: 139 meq/L (ref 137–147)

## 2013-06-12 LAB — I-STAT TROPONIN, ED: TROPONIN I, POC: 0 ng/mL (ref 0.00–0.08)

## 2013-06-12 LAB — D-DIMER, QUANTITATIVE (NOT AT ARMC): D DIMER QUANT: 0.34 ug{FEU}/mL (ref 0.00–0.48)

## 2013-06-12 LAB — TROPONIN I: Troponin I: <0.3 ng/mL (ref <0.30)

## 2013-06-12 LAB — PROTIME-INR
INR: 0.95 (ref 0.00–1.49)
INR: 1.02 (ref 0.00–1.49)
PROTHROMBIN TIME: 12.5 s (ref 11.6–15.2)
Prothrombin Time: 13.2 seconds (ref 11.6–15.2)

## 2013-06-12 LAB — TSH: TSH: 2.32 u[IU]/mL (ref 0.350–4.500)

## 2013-06-12 LAB — MAGNESIUM: MAGNESIUM: 2.1 mg/dL (ref 1.5–2.5)

## 2013-06-12 MED ORDER — TRIAMTERENE-HCTZ 37.5-25 MG PO CAPS
1.0000 | ORAL_CAPSULE | ORAL | Status: DC
Start: 2013-06-13 — End: 2013-06-12
  Filled 2013-06-12: qty 1

## 2013-06-12 MED ORDER — AMLODIPINE BESYLATE 10 MG PO TABS
10.0000 mg | ORAL_TABLET | Freq: Every day | ORAL | Status: DC
  Filled 2013-06-12: qty 1

## 2013-06-12 MED ORDER — ASPIRIN 81 MG PO CHEW
324.0000 mg | CHEWABLE_TABLET | ORAL | Status: AC
  Administered 2013-06-12: 324 mg via ORAL
  Filled 2013-06-12: qty 4

## 2013-06-12 MED ORDER — POTASSIUM CHLORIDE CRYS ER 20 MEQ PO TBCR
40.0000 meq | EXTENDED_RELEASE_TABLET | Freq: Once | ORAL | Status: AC
Start: 2013-06-12 — End: 2013-06-12
  Administered 2013-06-12: 40 meq via ORAL
  Filled 2013-06-12: qty 2

## 2013-06-12 MED ORDER — SODIUM CHLORIDE 0.9 % IJ SOLN
3.0000 mL | Freq: Two times a day (BID) | INTRAMUSCULAR | Status: DC
  Administered 2013-06-12: 3 mL via INTRAVENOUS

## 2013-06-12 MED ORDER — SODIUM CHLORIDE 0.9 % IJ SOLN: 3.0000 mL | INTRAMUSCULAR | Status: DC | PRN

## 2013-06-12 MED ORDER — SODIUM CHLORIDE 0.9 % IV SOLN: 250.0000 mL | INTRAVENOUS | Status: DC | PRN

## 2013-06-12 MED ORDER — ATORVASTATIN CALCIUM 20 MG PO TABS
20.0000 mg | ORAL_TABLET | Freq: Every day | ORAL | Status: DC
  Filled 2013-06-12: qty 1

## 2013-06-12 MED ORDER — PANTOPRAZOLE SODIUM 40 MG PO TBEC
40.0000 mg | DELAYED_RELEASE_TABLET | Freq: Every day | ORAL | Status: DC
  Administered 2013-06-12: 40 mg via ORAL
  Filled 2013-06-12: qty 1

## 2013-06-12 MED ORDER — ACETAMINOPHEN 500 MG PO TABS: 1000.0000 mg | ORAL_TABLET | Freq: Four times a day (QID) | ORAL | Status: DC | PRN

## 2013-06-12 MED ORDER — ASPIRIN 300 MG RE SUPP
300.0000 mg | RECTAL | Status: AC
  Filled 2013-06-12: qty 1

## 2013-06-12 MED ORDER — TAMSULOSIN HCL 0.4 MG PO CAPS
0.4000 mg | ORAL_CAPSULE | Freq: Every day | ORAL | Status: DC
  Administered 2013-06-12: 0.4 mg via ORAL
  Filled 2013-06-12 (×2): qty 1

## 2013-06-12 MED ORDER — TRIAMTERENE-HCTZ 37.5-25 MG PO TABS
1.0000 | ORAL_TABLET | Freq: Every day | ORAL | Status: DC
  Filled 2013-06-12 (×2): qty 1

## 2013-06-12 MED ORDER — ADULT MULTIVITAMIN W/MINERALS CH
1.0000 | ORAL_TABLET | Freq: Every day | ORAL | Status: DC
  Filled 2013-06-12 (×2): qty 1

## 2013-06-12 MED ORDER — CLOPIDOGREL BISULFATE 75 MG PO TABS
75.0000 mg | ORAL_TABLET | Freq: Every day | ORAL | Status: DC
  Administered 2013-06-13: 75 mg via ORAL
  Filled 2013-06-12: qty 1

## 2013-06-12 NOTE — ED Notes (Signed)
Pt in c/o episode of palpitations with chest tightness, shortness of breath and dizziness, states symptoms came on suddenly and lasted approx 15 min, he took his BP medication during that time, denies symptoms at this time, denies pain at this time

## 2013-06-12 NOTE — H&P (Signed)
Patient ID: Sean Brock MRN: [id number], DOB/AGE: November 11, 1934   Admit date: 06/12/2013   Primary Physician: Simona Huh, MD Primary Cardiologist: Dr. Aundra Dubin Dr. Lovena Le   HPI: 78 yo with history of HTN, aortic stenosis, CVA with loop recorder implanted for cryptogenic stroke and HLD who presented to [facility] today complaining of SOB, chest pain and lightheadedness. The patient was recently seen in the office for worsening exercise intolerance and shortness of breath. His most recent echo (3/23) revealed severe aortic stenosis with a worsening gradient  Mean gradient: 75mm Hg. Peak gradient: 39mm Hg. VTI ratio of LVOT to aortic valve: 0.25. He was set up for left and right heart cath on 06/06/2013. This revealed low to normal filling pressures as well as a totally occluded RCA with collaterals. He said that this was seen on prior cardiac cath about 20 years ago. He has disease in the LCA system but nonobstructive. He was then referred to CVTS for AVR with severe AS. Around 12:00pm today he was walking to his mailbox when he became acutely short of breath and lightheaded with some mild chest tightness. The chest pain did not radiate and was similar to the chest pain he had when he had his loop recorder implanted. The episode lasted about 15 minutes and eased off on its own. He currently feels well, with no chest pain or shortness of breath or dizziness.    Problem List  Past Medical History  Diagnosis Date  . HYPERLIPIDEMIA   . HYPERTENSION   . AORTIC STENOSIS     Mild to moderate. Echo (4/11) with EF 55-60%, mean gradient 18/peak 34;  echo 4/13: severe LVH, EF 55-60%, grade 1 diast dysfxn, mean 26/peak 53 c/w mild to mod AS  . Former smoker   . DYSPEPSIA   . Palpitations   . CAD (coronary artery disease)     LHC 11/1982:  mLAD 40%, pRCA 90%, dRCA 70% diffuse,  PDA occluded, normal LVF - treated medically  . GERD (gastroesophageal reflux disease)   . Trigeminal neuropathy   .  Arthritis     BACK  . Chronic kidney disease   . ED (erectile dysfunction)   . CVA (cerebral infarction)     Past Surgical History  Procedure Laterality Date  . Cataract extraction    . Lithotripsy    . Mouth surgery    . Inguinal hernia repair Right 01/15/2013    Procedure: HERNIA REPAIR INGUINAL ADULT;  Surgeon: Harl Bowie, MD;  Location: [place];  Service: General;  Laterality: Right;  . Insertion of mesh Right 01/15/2013    Procedure: INSERTION OF MESH;  Surgeon: Harl Bowie, MD;  Location: [place];  Service: General;  Laterality: Right;  . Loop recorder implant  04/15/2013    MDT LinQ implanred by Dr Lovena Le for cryptogenic stroke     Allergies  Allergies  Allergen Reactions  . Sudafed [Pseudoephedrine Hcl] Palpitations  . Cardizem [Diltiazem Hcl] Rash    Home Medications  Prior to Admission medications   Medication Sig Start Date End Date Taking? Authorizing Provider  acetaminophen (TYLENOL) 500 MG tablet Take 1,000 mg by mouth every 6 (six) hours as needed for mild pain.   Yes Historical Provider, MD  amLODipine (NORVASC) 10 MG tablet Take 10 mg by mouth daily.   Yes Historical Provider, MD  clopidogrel (PLAVIX) 75 MG tablet Take 75 mg by mouth daily with breakfast.   Yes Historical Provider, MD  Multiple Vitamin (MULTIVITAMIN WITH MINERALS) TABS tablet Take 1 tablet by mouth daily.   Yes Historical Provider, MD  omeprazole (PRILOSEC) 20 MG capsule Take 20 mg by mouth daily as needed (for acid reduction).    Yes Historical Provider, MD  Polyethyl Glycol-Propyl Glycol (SYSTANE OP) Place 1 drop into both eyes daily as needed (for dry eyes).    Yes Historical Provider, MD  rosuvastatin (CRESTOR) 10 MG tablet Take 10 mg by mouth daily.   Yes Historical Provider, MD  Tamsulosin HCl (FLOMAX) 0.4 MG CAPS Take 0.4 mg by mouth at bedtime.    Yes Historical Provider, MD  triamterene-hydrochlorothiazide (DYAZIDE) 37.5-25 MG per  capsule Take 1 capsule by mouth every morning.   Yes Historical Provider, MD    Family History  Family History  Problem Relation Age of Onset  . Diabetes    . Hypertension    . Lung cancer    . Cancer Sister     breast    Social History  History   Social History  . Marital Status: Married    Spouse Name: N/A    Number of Children: N/A  . Years of Education: N/A   Occupational History  . Not on file.   Social History Main Topics  . Smoking status: Former Smoker -- 1.00 packs/day for 30 years    Types: Cigarettes    Quit date: 08/29/2009  . Smokeless tobacco: Never Used  . Alcohol Use: No  . Drug Use: No  . Sexual Activity: Not on file   Other Topics Concern  . Not on file   Social History Narrative   He resides in [place] with his wife and son. 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  No    grandchildren. He smokes half a pack per day intermittently for the last 40    years. He drinks a glass of homemade wine mixed with diet Coke one time per    week. He denies any drugs, herbal medication, diet, or exercise program.           All other systems reviewed and are otherwise negative except as noted above.  Physical Exam  Blood pressure 123/68, pulse 56, temperature 98 F (36.7 C), temperature source Oral, resp. rate 12, weight 190 lb (86.183 kg), SpO2 98.00%.  General: Pleasant, NAD Psych: Normal affect. Neuro: Alert and oriented X 3. Moves all extremities spontaneously. HEENT: Normal  Neck: Supple without bruits or JVD. Lungs:  Resp regular and unlabored, CTA. Heart: RRR no s3, s4 4/6 systolic ejection murmur  Abdomen: Soft, non-tender, non-distended, BS + x 4.  Extremities: No clubbing, cyanosis or edema. DP/PT/Radials 2+ and equal bilaterally.  Labs  Lab Results  Component Value Date   WBC 7.6 06/12/2013   HGB 15.2 06/12/2013   HCT 43.2 06/12/2013   MCV 87.4 06/12/2013   PLT 204 06/12/2013    Recent Labs Lab  06/12/13 1250  NA 139  K 3.6*  CL 99  CO2 23  BUN 22  CREATININE 0.86  CALCIUM 9.5  GLUCOSE 116*   Lab Results  Component Value Date   CHOL 113 05/26/2013   HDL 37.60* 05/26/2013   LDLCALC 51 05/26/2013   TRIG 123.0 05/26/2013     Radiology/Studies  Dg Chest 2 View  06/12/2013   CLINICAL DATA:  Shortness of breath, dizziness  EXAM: CHEST  2 VIEW  COMPARISON:  05/27/2012  FINDINGS: Cardiomediastinal silhouette  is stable. No acute infiltrate or pleural effusion. No pulmonary edema. Mild degenerative changes thoracic spine.  IMPRESSION: No active cardiopulmonary disease.    Cardiac Catheterization Procedure Note 06/06/13 Procedure: Right Heart Cath,Selective Coronary Angiography  Indication: Severe aortic stenosis  Procedural Details: Allen's test was positive on the right. There was an IV in a brachial vein. This was replaced with a 5 French venous sheath. A Swan-Ganz catheter was used for the right heart catheterization. Standard protocol was followed for recording of right heart pressures and sampling of oxygen saturations. Fick cardiac output was calculated. The right rdial area was sterilely prepped and draped. A 5 French sheath was placed in the right radial artery using modificed Seldinger technique. The patient received 3 mg IA verapamil and weight-based IV heparin. The right radial Standard Judkins catheters were used for selective coronary angiography and left ventriculography. There were no immediate procedural complications. The patient was transferred to the post catheterization recovery area for further monitoring.  Procedural Findings:  Hemodynamics (mmHg)  RA mean 1  RV 30/5  PA 23/10  PCWP mean 9  AO 129/63  Oxygen saturations:  PA 73%  AO 96%  Cardiac Output (Fick) 6.35  Cardiac Index (Fick) 2.95  Coronary angiography:  Coronary dominance: right  Very tortuous vessels  Left mainstem: 30% distal LM.  Left anterior descending (LAD): 40-50% ostial LAD stenosis. 30%  proximal LAD stenosis at D1 (focal). Moderate D1 with 80-90% ostial stenosis.  Left circumflex (LCx): 40% proximal LCx after OM1. OM1 moderate, very tortuous but probably no significant stenosis.  Right coronary artery (RCA): Total occlusion with left to right collaterals.  Left ventriculography: Not done, known severe AS.  Final Conclusions: Low to normal filling pressures. Patient has a totally occluded RCA with collaterals. He says that this was seen on prior cardiac cath about 20 years ago. He has disease in the LCA system but nonobstructive. Will refer to CVTS for AVR with severe AS.    2D ECHO 05/26/2013 LV EF: 65% - 70% ------------------------------------------------------------ Study Conclusions - Left ventricle: The cavity size was normal. Wall thickness was increased in a pattern of moderate LVH. Systolic function was vigorous. The estimated ejection fraction was in the range of 65% to 70%. Wall motion was normal; there were no regional wall motion abnormalities. Doppler parameters are consistent with abnormal left ventricular relaxation (grade 1 diastolic dysfunction). There was no evidence of elevated ventricular filling pressure by Doppler parameters. - Aortic valve: Trileaflet; moderately thickened, moderately calcified leaflets. Cusp separation was moderately reduced. There was severe stenosis. Trivial regurgitation. Mean gradient: 62mm Hg (S). Peak gradient: 72mm Hg (S). VTI ratio of LVOT to aortic valve: 0.25. - Aortic root: The aortic root was normal in size. - Mitral valve: Calcified posterior annulus. Trivial regurgitation. - Left atrium: The atrium was at the upper limits of normal in size. - Right ventricle: RV systolic pressure: 35KK Hg (S, est). - Right atrium: The atrium was mildly dilated. - Tricuspid valve: Mild-moderate regurgitation. - Pulmonic valve: No regurgitation. - Pulmonary arteries: Systolic pressure was within the normal  range. Impressions: - Compared to the prior study from 05/17/2012 the transaortic gradient are significantly increased.   ECG  NSR HR 61  ASSESSMENT AND PLAN 79 yo with history of HTN, aortic stenosis, CVA with loop recorder implanted for cryptogenic stroke and HLD who presented to [facility] today complaining of SOB, chest pain and lightheadedness.  Chest pain/lightheadness/SOB --Troponin x1 negative, ECG with no acute ST or TW changes. Recently had  a LHC showing stable CAD. Will admit to observation and cycle cardiac enzymes.  --This could be related to his AS, he has an appointment with Dr. Roxy Manns next week. It also could be related to a possible dysrhymia. We will have his [employer] loop recorder interrogated on this admission.  -- Recent heart cath, will check a D-Dimer to rule out PE   Aortic Stenosis -- Underwent cardiac on 06/06/13 which revealed low to normal filling pressures. Patient has a totally occluded RCA with collaterals. He says that this was seen on prior cardiac cath about 20 years ago. He has disease in the LCA system but nonobstructive. He was referred to CVTS for AVR with severe AS. Appt with Dr. Roxy Manns next week. --Severe stenosis on 05/26/13 ECHO: Mean gradient: 75mm Hg. Peak gradient: 73mm Hg. VTI ratio of LVOT to aortic valve: 0.25. Compared to the prior study from 05/17/2012 the transaortic gradient are significantly increased.  Hypertension  --BP is controlled.   CVA  CVA in 3/14. He has an implanted loop recorder now to monitor for paroxysmal atrial fibrillation.  -- Will interrogate pacemaker   Hyperlipidemia  Good lipids in 3/15. Would like to keep LDL < 70 with history of CVA. Continue Crestor.     Signed, Perry Mount, PA-C 06/12/2013, 2:36 PM  Pager [phone] Patient seen and examined and history reviewed. Agree with above findings and plan. 79 yo WM with recent cardiac evaluation significant for Severe AS and single vessel CAD with chronic occlusion of RCA.  He has an implantable loop recorder for history of cryptogenic CVA. Presents today with sudden onset SOB, dizziness walking on his porch. He thought he was leaving this earth. Symptoms lasted 15-20 minutes then resolved. I suspect symptoms related to his severe AS. Need to consider possible PE with recent R/L heart cath. Will check d-dimer and cycle enzymes. We will interrogate his loop recorder. Observe overnight tonight. If work up negative plan DC tomorrow and keep follow up with Dr. Roxy Manns next week as planned for AVR.    Ander Slade [facility] 06/12/2013 3:40 PM

## 2013-06-12 NOTE — ED Provider Notes (Signed)
CSN: [id number]     Arrival date & time 06/12/13  1242 History   First MD Initiated Contact with Patient 06/12/13 1259     Chief Complaint  Patient presents with  . Palpitations  . Shortness of Breath     (Consider location/radiation/quality/duration/timing/severity/associated sxs/prior Treatment) HPI Comments: Patient is a 79 year old male past medical history significant for hyperlipidemia, hypertension, aortic stenosis awaiting surgery consult for aortic valve replacement next week, CAD, CKD, history of CVA presenting to the emergency department for acute onset episode of lightheadedness with associated shortness of breath, chest tightness and palpitations while ambulating to the mailbox 45 minutes prior to arrival. Patient states the episode lasted about 15 minutes and subsided while he sat down. Patient had a cardiac catheterization done 6 days ago which showed Right coronary artery (RCA): Total occlusion with left to right collaterals with severe aortic stenosis. Patient is d/t to see Dr. Roxy Manns for an Aortic Value Replacement. He has had no further episodes of symptoms while in the ED.   Patient is a 79 y.o. male presenting with palpitations and shortness of breath.  Palpitations Associated symptoms: dizziness and shortness of breath   Associated symptoms: no numbness   Shortness of Breath   Past Medical History  Diagnosis Date  . HYPERLIPIDEMIA   . HYPERTENSION   . AORTIC STENOSIS     Mild to moderate. Echo (4/11) with EF 55-60%, mean gradient 18/peak 34;  echo 4/13: severe LVH, EF 55-60%, grade 1 diast dysfxn, mean 26/peak 53 c/w mild to mod AS  . Former smoker   . DYSPEPSIA   . Palpitations   . CAD (coronary artery disease)     LHC 11/1982:  mLAD 40%, pRCA 90%, dRCA 70% diffuse,  PDA occluded, normal LVF - treated medically  . GERD (gastroesophageal reflux disease)   . Trigeminal neuropathy   . Arthritis     BACK  . Chronic kidney disease   . ED (erectile dysfunction)   .  CVA (cerebral infarction)    Past Surgical History  Procedure Laterality Date  . Cataract extraction    . Lithotripsy    . Mouth surgery    . Inguinal hernia repair Right 01/15/2013    Procedure: HERNIA REPAIR INGUINAL ADULT;  Surgeon: Harl Bowie, MD;  Location: [facility];  Service: General;  Laterality: Right;  . Insertion of mesh Right 01/15/2013    Procedure: INSERTION OF MESH;  Surgeon: Harl Bowie, MD;  Location: [facility];  Service: General;  Laterality: Right;  . Loop recorder implant  04/15/2013    MDT LinQ implanred by Dr Lovena Le for cryptogenic stroke   Family History  Problem Relation Age of Onset  . Diabetes    . Hypertension    . Lung cancer    . Cancer Sister     breast   History  Substance Use Topics  . Smoking status: Former Smoker -- 1.00 packs/day for 30 years    Types: Cigarettes    Quit date: 08/29/2009  . Smokeless tobacco: Never Used  . Alcohol Use: No    Review of Systems  Respiratory: Positive for shortness of breath.   Cardiovascular: Positive for palpitations.  Neurological: Positive for dizziness. Negative for syncope, weakness and numbness.  All other systems reviewed and are negative.     Allergies  Sudafed and Cardizem  Home Medications   Current Outpatient Rx  Name  Route  Sig  Dispense  Refill  . acetaminophen (TYLENOL) 500  MG tablet   Oral   Take 1,000 mg by mouth every 6 (six) hours as needed for mild pain.         Marland Kitchen amLODipine (NORVASC) 10 MG tablet   Oral   Take 10 mg by mouth daily.         . clopidogrel (PLAVIX) 75 MG tablet   Oral   Take 75 mg by mouth daily with breakfast.         . Multiple Vitamin (MULTIVITAMIN WITH MINERALS) TABS tablet   Oral   Take 1 tablet by mouth daily.         Marland Kitchen omeprazole (PRILOSEC) 20 MG capsule   Oral   Take 20 mg by mouth daily as needed (for acid reduction).          Vladimir Faster Glycol-Propyl Glycol (SYSTANE OP)   Both Eyes    Place 1 drop into both eyes daily as needed (for dry eyes).          . rosuvastatin (CRESTOR) 10 MG tablet   Oral   Take 10 mg by mouth daily.         . Tamsulosin HCl (FLOMAX) 0.4 MG CAPS   Oral   Take 0.4 mg by mouth at bedtime.          . triamterene-hydrochlorothiazide (DYAZIDE) 37.5-25 MG per capsule   Oral   Take 1 capsule by mouth every morning.          BP 123/68  Pulse 56  Temp(Src) 98 F (36.7 C) (Oral)  Resp 12  Wt 190 lb (86.183 kg)  SpO2 98% Physical Exam  Nursing note and vitals reviewed. Constitutional: He is oriented to person, place, and time. He appears well-developed and well-nourished. No distress.  HENT:  Head: Normocephalic and atraumatic.  Right Ear: External ear normal.  Left Ear: External ear normal.  Nose: Nose normal.  Mouth/Throat: Oropharynx is clear and moist. No oropharyngeal exudate.  Eyes: Conjunctivae and EOM are normal. Pupils are equal, round, and reactive to light.  Neck: Normal range of motion. Neck supple.  Cardiovascular: Normal rate, regular rhythm and intact distal pulses.   Murmur heard.  Systolic murmur is present  Pulmonary/Chest: Effort normal and breath sounds normal. No respiratory distress.  Abdominal: Soft. There is no tenderness.  Neurological: He is alert and oriented to person, place, and time. He has normal strength. No cranial nerve deficit. Gait normal. GCS eye subscore is 4. GCS verbal subscore is 5. GCS motor subscore is 6.  Sensation grossly intact.  No pronator drift.  Bilateral heel-knee-shin intact.  Skin: Skin is warm and dry. He is not diaphoretic.    ED Course  Procedures (including critical care time) Medications - No data to display  Labs Review Labs Reviewed  BASIC METABOLIC PANEL - Abnormal; Notable for the following:    Potassium 3.6 (*)    Glucose, Bld 116 (*)    GFR calc non Af Amer 82 (*)    All other components within normal limits  CBC  PROTIME-INR  I-STAT TROPOININ, ED    Imaging Review Dg Chest 2 View  06/12/2013   CLINICAL DATA:  Shortness of breath, dizziness  EXAM: CHEST  2 VIEW  COMPARISON:  05/27/2012  FINDINGS: Cardiomediastinal silhouette is stable. No acute infiltrate or pleural effusion. No pulmonary edema. Mild degenerative changes thoracic spine.  IMPRESSION: No active cardiopulmonary disease.   Electronically Signed   By: Lahoma Crocker M.D.   On: 06/12/2013 14:09  EKG Interpretation   Date/Time:  Thursday June 12 2013 12:48:55 EDT Ventricular Rate:  61 PR Interval:  178 QRS Duration: 92 QT Interval:  426 QTC Calculation: 428 R Axis:   48 Text Interpretation:  Normal sinus rhythm ST abnormality, possible  digitalis effect No significant change since last tracing Confirmed by  Maryan Rued  MD, Loree Fee ([id number]) on 06/12/2013 2:20:27 PM      MDM   Final diagnoses:  Near syncope    Filed Vitals:   06/12/13 1415  BP: 123/68  Pulse: 56  Temp:   Resp: 12    Afebrile, NAD, non-toxic appearing, AAOx4. Patient presenting with acute onset dizziness, chest tightness, and SOB with exertion. Symptoms sound related to severe AS. Patient is six days s/p cardiac catheterization with severe disease noted. I have reviewed nursing notes, vital signs, and all appropriate lab and imaging results for this patient. Cardiology was consulted for possible admission for patient. Patient d/w with Dr. Maryan Rued, agrees with plan.         Harlow Mares, PA-C 06/12/13 1528

## 2013-06-13 ENCOUNTER — Other Ambulatory Visit: Payer: Self-pay

## 2013-06-13 ENCOUNTER — Encounter (HOSPITAL_COMMUNITY): Payer: Self-pay | Admitting: Physician Assistant

## 2013-06-13 DIAGNOSIS — Z8679 Personal history of other diseases of the circulatory system: Secondary | ICD-10-CM | POA: Insufficient documentation

## 2013-06-13 DIAGNOSIS — I350 Nonrheumatic aortic (valve) stenosis: Secondary | ICD-10-CM | POA: Diagnosis present

## 2013-06-13 DIAGNOSIS — K219 Gastro-esophageal reflux disease without esophagitis: Secondary | ICD-10-CM | POA: Diagnosis present

## 2013-06-13 DIAGNOSIS — I2510 Atherosclerotic heart disease of native coronary artery without angina pectoris: Secondary | ICD-10-CM | POA: Diagnosis present

## 2013-06-13 DIAGNOSIS — I639 Cerebral infarction, unspecified: Secondary | ICD-10-CM | POA: Diagnosis present

## 2013-06-13 LAB — COMPREHENSIVE METABOLIC PANEL
ALBUMIN: 3.4 g/dL — AB (ref 3.5–5.2)
ALT: 9 U/L (ref 0–53)
AST: 11 U/L (ref 0–37)
Alkaline Phosphatase: 36 U/L — ABNORMAL LOW (ref 39–117)
BILIRUBIN TOTAL: 0.7 mg/dL (ref 0.3–1.2)
BUN: 19 mg/dL (ref 6–23)
CO2: 26 mEq/L (ref 19–32)
CREATININE: 0.88 mg/dL (ref 0.50–1.35)
Calcium: 8.9 mg/dL (ref 8.4–10.5)
Chloride: 101 mEq/L (ref 96–112)
GFR calc Af Amer: >90 mL/min (ref >90)
GFR calc non Af Amer: 81 mL/min — ABNORMAL LOW (ref >90)
Glucose, Bld: 77 mg/dL (ref 70–99)
Potassium: 3.9 mEq/L (ref 3.7–5.3)
Sodium: 140 mEq/L (ref 137–147)
TOTAL PROTEIN: 6.3 g/dL (ref 6.0–8.3)

## 2013-06-13 LAB — TROPONIN I: Troponin I: <0.3 ng/mL (ref <0.30)

## 2013-06-13 NOTE — Discharge Summary (Signed)
Discharge Summary   Patient ID: Sean Brock MRN: [id number], DOB/AGE: Nov 26, 1934 79 y.o. Admit date: 06/12/2013 D/C date:     06/13/2013  Primary Care Provider: Simona Huh, MD Primary Cardiologist: Aundra Dubin  Primary Discharge Diagnoses:  1. Chest pain/dyspnea/lightheadedness, may be due to severe aortic stenosis - loop interrogated without any significant events, ruled out for MI 2. Severe AS - pending CVTS eval 3. HTN - Triamterene/HCTZ stopped due to this episode & low filling pressures on recent cath 4. CAD - cath 06/06/13 showed chronic CTO RCA with collaterals, disease in LCA system but nonobstructive  Secondary Discharge Diagnoses:  1. Prior CVA - 3/14 right parietal infarct. Carotid US 3/14 showed no significant disease. He has an implanted loop recorder to look for atrial fibrillation.  2. Hyperlipidemia 3. Vertigo 4. Palpitations: Event monitor (5/13) with occasional PVCs, PACs but no significant arrhythmia.  5. Former smoker 6. Dyspepsia 7. GERD 8. Trigeminal neuropathy 9. Arthritis 10. ED 11. Sinus bradycardia 2013 - HR 40s in office, BB stopped at that time  Hospital Course: Sean Brock is a 79 y/o M with history of HTN, aortic stenosis, and stroke 05/2012 who presented recently to Dr. Claris Gladden office for followup. With regard to his stroke, he seems to have had a complete recovery, no further significant stroke-related symptoms. He had a loop recorder placed to monitor for atrial fibrillation. At his office visit, he reported a significant decline in his stamina over the last year. He was getting tired more easily and has had SOB while walking up a hill. He had occasional "dizziness" that sounds vertiginous if he moves his head "fast" from one side to the other, that did not sound orthostatic. Recent echo 3/15 showed severe aortic stenosis with mean gradient 40 mmHg /peak gradient 61 mmHg, AVA 0.88 cm^2, EF 02-63%, grade 1 diastolic dysfunction. He was set up for left  and right heart cath on 06/06/2013. This revealed low to normal filling pressures as well as a totally occluded RCA with collaterals. He had disease in the LCA system but nonobstructive. He was then referred to CVTS for AVR with severe AS, appt pending 06/17/13.  In the interim he presented back to the ER. Yesterday around noon he was walking to his mailbox when he became acutely short of breath and lightheaded with some mild chest tightness. The chest pain did not radiate and was similar to the chest pain he had when he had his loop recorder implanted. The episode lasted about 15 minutes and eased off on its own. He presented to the ER for evaluation and was admitted for further observation. Loop was interrogated without significant arrhythmias. ECG was unchanged. D-dimer and cardiac enzymes were negative. It was felt that his symptoms were possibly related to his severe AS. Since his filling pressures were low on recent RHC, HCTZ/triamterene was stopped as we want to avoid dehydration. The patient feels well today. Dr. Aundra Dubin has seen and examined the patient today and feels he is stable for discharge. The patient has followup with Dr. Roxy Manns on Tuesday (4/14). Dr. Aundra Dubin would like to see him after surgery.   Discharge Vitals: Blood pressure 121/77, pulse 54, temperature 97.7 F (36.5 C), temperature source Oral, resp. rate 18, height 6\' 3"  (1.905 m), weight 190 lb 2.5 oz (86.255 kg), SpO2 98.00%.  Labs: Lab Results  Component Value Date   WBC 7.6 06/12/2013   HGB 15.2 06/12/2013   HCT 43.2 06/12/2013   MCV 87.4 06/12/2013   PLT  204 06/12/2013    Recent Labs Lab 06/13/13 0100  NA 140  K 3.9  CL 101  CO2 26  BUN 19  CREATININE 0.88  CALCIUM 8.9  PROT 6.3  BILITOT 0.7  ALKPHOS 36*  ALT 9  AST 11  GLUCOSE 77    Recent Labs  06/12/13 2000 06/13/13 0100  TROPONINI <0.30 <0.30   Lab Results  Component Value Date   CHOL 113 05/26/2013   HDL 37.60* 05/26/2013   LDLCALC 51 05/26/2013   TRIG  123.0 05/26/2013   Lab Results  Component Value Date   DDIMER 0.34 06/12/2013    Diagnostic Studies/Procedures   Dg Chest 2 View 06/12/2013   CLINICAL DATA:  Shortness of breath, dizziness  EXAM: CHEST  2 VIEW  COMPARISON:  05/27/2012  FINDINGS: Cardiomediastinal silhouette is stable. No acute infiltrate or pleural effusion. No pulmonary edema. Mild degenerative changes thoracic spine.  IMPRESSION: No active cardiopulmonary disease.   Electronically Signed   By: Lahoma Crocker M.D.   On: 06/12/2013 14:09    Discharge Medications   Current Discharge Medication List    CONTINUE these medications which have NOT CHANGED   Details  acetaminophen (TYLENOL) 500 MG tablet Take 1,000 mg by mouth every 6 (six) hours as needed for mild pain.    amLODipine (NORVASC) 10 MG tablet Take 10 mg by mouth daily.    clopidogrel (PLAVIX) 75 MG tablet Take 75 mg by mouth daily with breakfast.    Multiple Vitamin (MULTIVITAMIN WITH MINERALS) TABS tablet Take 1 tablet by mouth daily.    omeprazole (PRILOSEC) 20 MG capsule Take 20 mg by mouth daily as needed (for acid reduction).     Polyethyl Glycol-Propyl Glycol (SYSTANE OP) Place 1 drop into both eyes daily as needed (for dry eyes).     rosuvastatin (CRESTOR) 10 MG tablet Take 10 mg by mouth daily.    Tamsulosin HCl (FLOMAX) 0.4 MG CAPS Take 0.4 mg by mouth at bedtime.       STOP taking these medications     triamterene-hydrochlorothiazide (DYAZIDE) 37.5-25 MG per capsule         Disposition   The patient will be discharged in stable condition to home. Discharge Orders   Future Appointments Provider Department Dept Phone   06/17/2013 4:00 PM [name], MD [facility] [place] [phone]   Future Orders Complete By Expires   Diet - low sodium heart healthy  As directed    Increase activity slowly  As directed    Scheduling Instructions:   Do not drive if you are not feeling well.     Follow-up  Information   Follow up with [name], MD. (06/17/13 at 4pm (as scheduled))    Specialty:  Cardiothoracic Surgery   Contact information:   [place] [address] [phone]       Follow up with [name], MD. (Dr. Aundra Dubin would like you to schedule a followup appointment after your valve surgery. If you have any concerns in the meantime, please call our office.)    Specialty:  Cardiology   Contact information:   [address] [place] [address] [phone]         Duration of Discharge Encounter: Greater than 30 minutes including physician and PA time.  Signed, Charlie Pitter PA-C 06/13/2013, 9:21 AM

## 2013-06-13 NOTE — Progress Notes (Signed)
Patient ID: Sean Brock, male   DOB: 01-09-1935, 79 y.o.   MRN: [id number]   SUBJECTIVE: No recurrent symptoms overnight.  No arrhythmias on telemetry, mild bradycardia.   Scheduled Meds: . amLODipine  10 mg Oral Daily  . atorvastatin  20 mg Oral q1800  . clopidogrel  75 mg Oral Q breakfast  . multivitamin with minerals  1 tablet Oral Daily  . pantoprazole  40 mg Oral Daily  . sodium chloride  3 mL Intravenous Q12H  . tamsulosin  0.4 mg Oral QHS  . triamterene-hydrochlorothiazide  1 tablet Oral Daily   Continuous Infusions:  PRN Meds:.sodium chloride, acetaminophen, sodium chloride    Filed Vitals:   06/12/13 1800 06/12/13 1828 06/12/13 2125 06/13/13 0505  BP: 132/74 131/74 110/61 121/77  Pulse: 57 54 56 54  Temp:  98 F (36.7 C) 98.4 F (36.9 C) 97.7 F (36.5 C)  TempSrc:  Oral Oral Oral  Resp: 14 18 18 18   Height:  6\' 3"  (1.905 m)    Weight:  86.183 kg (190 lb)  86.255 kg (190 lb 2.5 oz)  SpO2: 98% 95% 96% 98%    Intake/Output Summary (Last 24 hours) at 06/13/13 0840 Last data filed at 06/12/13 2029  Gross per 24 hour  Intake    240 ml  Output      0 ml  Net    240 ml    LABS: Basic Metabolic Panel:  Recent Labs  06/12/13 1250 06/12/13 2000 06/13/13 0100  NA 139  --  140  K 3.6*  --  3.9  CL 99  --  101  CO2 23  --  26  GLUCOSE 116*  --  77  BUN 22  --  19  CREATININE 0.86  --  0.88  CALCIUM 9.5  --  8.9  MG  --  2.1  --    Liver Function Tests:  Recent Labs  06/13/13 0100  AST 11  ALT 9  ALKPHOS 36*  BILITOT 0.7  PROT 6.3  ALBUMIN 3.4*   No results found for this basename: LIPASE, AMYLASE,  in the last 72 hours CBC:  Recent Labs  06/12/13 1250  WBC 7.6  HGB 15.2  HCT 43.2  MCV 87.4  PLT 204   Cardiac Enzymes:  Recent Labs  06/12/13 2000 06/13/13 0100  TROPONINI <0.30 <0.30   BNP: No components found with this basename: POCBNP,  D-Dimer:  Recent Labs  06/12/13 1250  DDIMER 0.34   Hemoglobin A1C: No results  found for this basename: HGBA1C,  in the last 72 hours Fasting Lipid Panel: No results found for this basename: CHOL, HDL, LDLCALC, TRIG, CHOLHDL, LDLDIRECT,  in the last 72 hours Thyroid Function Tests:  Recent Labs  06/12/13 2000  TSH 2.320   Anemia Panel: No results found for this basename: VITAMINB12, FOLATE, FERRITIN, TIBC, IRON, RETICCTPCT,  in the last 72 hours  RADIOLOGY: Dg Chest 2 View  06/12/2013   CLINICAL DATA:  Shortness of breath, dizziness  EXAM: CHEST  2 VIEW  COMPARISON:  05/27/2012  FINDINGS: Cardiomediastinal silhouette is stable. No acute infiltrate or pleural effusion. No pulmonary edema. Mild degenerative changes thoracic spine.  IMPRESSION: No active cardiopulmonary disease.   Electronically Signed   By: Lahoma Crocker M.D.   On: 06/12/2013 14:09    PHYSICAL EXAM General: NAD Neck: No JVD, no thyromegaly or thyroid nodule.  Lungs: Clear to auscultation bilaterally with normal respiratory effort. CV: Nondisplaced PMI.  Heart regular S1/S2,  no E5/U3, 3/6 systolic murmur upper sternal border with obscuring of S2.  No peripheral edema.  No carotid bruit.  Normal pedal pulses.  Abdomen: Soft, nontender, no hepatosplenomegaly, no distention.  Neurologic: Alert and oriented x 3.  Psych: Normal affect. Extremities: No clubbing or cyanosis.   TELEMETRY: Reviewed telemetry pt in sinus bradycardia, upper 40s-50s overnight  ASSESSMENT AND PLAN: 79 yo with severe aortic stenosis, HTN, prior CVA, and CAD presented after episode of chest pain, dyspnea, and lightheadedness yesterday while walking to mailbox.  1. Chest pain/dyspnea/lightheaded episode: This may be related to his severe AS.  I reviewed his loop recorder and he has had no events. He had mild bradycardia overnight. ECG unchanged.   D dimer and cardiac enzymes negative.  He feels good this morning.  Event may be related to his AS, which has started to be symptomatic.  - He will ambulate this morning.  If he does ok,  may go home to followup with CVTS for AVR evaluation on Tuesday.  2. HTN: Would have him stop HCTZ/triamterene.  Filling pressures were low on recent RHC.  Want to avoid dehydration.  3. CAD: Recent cath showed chronic CTO RCA with collaterals.   4. Disposition: Home today with same meds as prior to admission except stop triamterene/HCTZ.  He has followup with CVTS Roxy Manns) on Tuesday and can see me as scheduled.   Larey Dresser 06/13/2013 8:47 AM

## 2013-06-13 NOTE — ED Provider Notes (Signed)
Medical screening examination/treatment/procedure(s) were conducted as a shared visit with non-physician practitioner(s) and myself.  I personally evaluated the patient during the encounter.   EKG Interpretation   Date/Time:  Thursday June 12 2013 12:48:55 EDT Ventricular Rate:  61 PR Interval:  178 QRS Duration: 92 QT Interval:  426 QTC Calculation: 428 R Axis:   48 Text Interpretation:  Normal sinus rhythm ST abnormality, possible  digitalis effect No significant change since last tracing Confirmed by  Maryan Rued  MD, Dakisha Schoof ([id number]) on 06/12/2013 2:20:27 PM      Pt with hx of AS with near syncope event today which sounds classic for AS.  Pt currently sx free and normal VS. Will have cards eval as pt currently waiting for CTS eval for valve replacement.  Blanchie Dessert, MD 06/13/13 [phone]

## 2013-06-13 NOTE — Progress Notes (Signed)
Pt discharged to home per MD order. Pt received and reviewed all discharge instructions and medication information including follow-up appointments and prescription information. Pt verbalized understanding. Pt alert and oriented at discharge with no complaints of pain. Pt IV and telemetry box removed prior to discharge. Pt escorted to private vehicle via wheelchair by guest services volunteer. Sean Brock

## 2013-06-16 ENCOUNTER — Ambulatory Visit (INDEPENDENT_AMBULATORY_CARE_PROVIDER_SITE_OTHER): Payer: Medicare Other | Admitting: *Deleted

## 2013-06-16 ENCOUNTER — Telehealth: Payer: Self-pay | Admitting: Cardiology

## 2013-06-16 ENCOUNTER — Other Ambulatory Visit: Payer: Self-pay | Admitting: Cardiology

## 2013-06-16 DIAGNOSIS — I6350 Cerebral infarction due to unspecified occlusion or stenosis of unspecified cerebral artery: Secondary | ICD-10-CM

## 2013-06-16 DIAGNOSIS — I639 Cerebral infarction, unspecified: Secondary | ICD-10-CM

## 2013-06-16 NOTE — Telephone Encounter (Signed)
Pt's wife advised.

## 2013-06-16 NOTE — Telephone Encounter (Incomplete)
When to stop Plavix will depend on when surgery will be.  Would hold off on HCTZ/tra

## 2013-06-16 NOTE — Telephone Encounter (Signed)
New message    Wife calling    C/O has concerns regarding blood pressure on yesterday  170/82 , then  140-143/ 62 .   Should medication be adjusted.

## 2013-06-16 NOTE — Telephone Encounter (Signed)
Stay off HCTZ for now.  When to stop Plavix will depend on when the surgery is scheduled.

## 2013-06-16 NOTE — Telephone Encounter (Signed)
Pt's wife states BP yesterday after showering was 170/82 (only high reading since DC from hospital) -later that day it was 140-143/62. This morning his BP was 126/80. Pt recently in hospital and Triamterene/HCT was discontinued. Pt is still taking amlodipine 10mg  daily. Pt's wife asking about stopping Plavix prior to heart surgery, he has an appt with Dr Roxy Manns tomorrow and surgery has not been scheduled yet. I will forward to Dr Aundra Dubin for review.

## 2013-06-17 ENCOUNTER — Encounter: Payer: Self-pay | Admitting: Thoracic Surgery (Cardiothoracic Vascular Surgery)

## 2013-06-17 ENCOUNTER — Institutional Professional Consult (permissible substitution) (INDEPENDENT_AMBULATORY_CARE_PROVIDER_SITE_OTHER): Payer: Medicare Other | Admitting: Thoracic Surgery (Cardiothoracic Vascular Surgery)

## 2013-06-17 VITALS — BP 119/72 | HR 55 | Resp 20 | Ht 75.0 in | Wt 190.0 lb

## 2013-06-17 DIAGNOSIS — I359 Nonrheumatic aortic valve disorder, unspecified: Secondary | ICD-10-CM

## 2013-06-17 DIAGNOSIS — I350 Nonrheumatic aortic (valve) stenosis: Secondary | ICD-10-CM

## 2013-06-17 NOTE — Patient Instructions (Signed)
Stop taking Plavix

## 2013-06-17 NOTE — H&P (Addendum)
[address]       [place] [address]             [phone]     CARDIOTHORACIC SURGERY CONSULTATION REPORT  Referring Provider is [facility], Elby Showers, MD PCP is Simona Huh, MD  Chief Complaint  Patient presents with  . 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 The patient's cardiac disease dates back more than 20 years ago at which time he underwent diagnostic cardiac catheterization demonstrating single-vessel coronary artery disease. He was treated medically and followed for years by Dr. Olevia Perches.  He developed a heart murmur on physical exam and was found to have aortic stenosis which was followed using serial transthoracic echocardiograms.  In March of 2014 the patient suffered a minor stroke manifested by symptoms of left-sided facial droop with aphasia.  Symptoms resolved rapidly but MRI revealed evidence of small right parietal stroke, potentially related to embolization. Carotid duplex scan did not reveal significant cerebrovascular disease. The patient recently had a loop recorder placed to rule out the possibility of occult paroxysmal atrial fibrillation, but so far of the loop recorder has not identified any significant arrhythmias.  Over the past year or two the patient has been followed by Dr. Aundra Dubin. During this time the patient has developed decreased exercise tolerance with progressive fatigue as well as intermittent exertional shortness of breath and chest tightness. The patient additionally describes an intermittent history of dizzy spells dating back several years which has been intermittently attributed to vertigo.  Followup transthoracic echocardiogram  performed 05/26/2013 demonstrated significant progression of the severity of aortic stenosis with peak velocity across the aortic valve measured greater than 4 m/s corresponding to peak and mean transvalvular gradients of 61 and 40 mm mercury respectively.  The patient subsequently underwent left and right heart catheterization which revealed the presence of multivessel coronary artery disease including 100% chronic total occlusion of the right coronary artery with left to right collateral filling of the terminal branches of the right coronary system. There was moderate nonobstructive disease in the left coronary system with exception of 80-90% focal ostial stenosis of a small diagonal branch. Pulmonary artery pressures were normal. Transvalvular gradient across the aortic valve was not assessed at catheterization. The patient has been referred for possible elective surgical intervention.  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 He states that he had been reasonably active for most of his life although he admits that he has been less active over the past 2 years, with considerable progression of decreased activity over the past few months.  He describes a long history of intermittent dizzy spells without syncope. These or not necessarily related to physical activity or position. He thinks his dizzy spells may be worse when his blood pressure is running high rather than low. He has had some intermittent exertional chest tightness and shortness of breath. He has significantly decreased energy with progressive fatigue. He denies any history of PND, orthopnea or lower extremity edema.  Last week while walking to the mailbox he developed somewhat acute onset of shortness breath and dizziness which prompted him to present to the emergency department. Electrocardiogram show unrevealing and he  ruled out for acute myocardial infarction. His loop recorder was interrogated  and did not reveal any signs of arrhythmia. His symptoms resolved very quickly.  Past Medical History  Diagnosis Date  . HYPERLIPIDEMIA   . HYPERTENSION   . AORTIC STENOSIS   . Former smoker   . DYSPEPSIA   . Palpitations     a. Event monitor (5/13) with occasional PVCs, PACs but no significant arrhythmia.   Marland Kitchen CAD (coronary artery disease)   . GERD (gastroesophageal reflux disease)   . Trigeminal neuropathy   . Arthritis     BACK  . ED (erectile dysfunction)   . CVA (cerebral infarction)     a. 3/14 right parietal infarct. Carotid US 3/14 showed no significant disease. He has an implanted loop recorder to look for atrial fibrillation.   . Vertigo   . Sinus bradycardia     a. 2013 - HR 40's in the office. BB stopped.    Past Surgical History  Procedure Laterality Date  . Cataract extraction    . Lithotripsy    . Mouth surgery    . Inguinal hernia repair Right 01/15/2013    Procedure: HERNIA REPAIR INGUINAL ADULT;  Surgeon: Harl Bowie, MD;  Location: [place];  Service: General;  Laterality: Right;  . Insertion of mesh Right 01/15/2013    Procedure: INSERTION OF MESH;  Surgeon: Harl Bowie, MD;  Location: [place];  Service: General;  Laterality: Right;  . Loop recorder implant  04/15/2013    MDT LinQ implanred by Dr Lovena Le for cryptogenic stroke    Family History  Problem Relation Age of Onset  . Diabetes    . Hypertension    . Lung cancer    . Cancer Sister     breast    History   Social History  . Marital Status: Married    Spouse Name: N/A    Number of Children: N/A  . Years of Education: N/A   Occupational History  . Not on file.   Social History Main Topics  . Smoking status: Former Smoker -- 1.00 packs/day for 30 years    Types: Cigarettes    Quit date: 08/29/2009  . Smokeless tobacco: Never Used  . Alcohol Use: No  . Drug Use: No  . Sexual Activity: Not on file   Other Topics Concern  . Not on  file   Social History Narrative   He resides in [place] with his wife and son. 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  No    grandchildren. He smokes half a pack per day intermittently for the last 40    years. He drinks a glass of homemade wine mixed with diet Coke one time per    week. He denies any drugs, herbal medication, diet, or exercise program.          Current Outpatient Prescriptions  Medication Sig Dispense Refill  . acetaminophen (TYLENOL) 500 MG tablet Take 1,000 mg by mouth every 6 (six) hours as needed for mild pain.      Marland Kitchen amLODipine (NORVASC) 10 MG tablet Take 10 mg by mouth daily.      . clopidogrel (PLAVIX) 75 MG tablet TAKE ONE TABLET BY MOUTH ONCE DAILY WITH BREAKFAST  30 tablet  0  . Multiple Vitamin (MULTIVITAMIN WITH MINERALS) TABS tablet Take 1 tablet by mouth daily.      Marland Kitchen omeprazole (  PRILOSEC) 20 MG capsule Take 20 mg by mouth daily as needed (for acid reduction).       Vladimir Faster Glycol-Propyl Glycol (SYSTANE OP) Place 1 drop into both eyes daily as needed (for dry eyes).       . rosuvastatin (CRESTOR) 10 MG tablet Take 10 mg by mouth daily.      . Tamsulosin HCl (FLOMAX) 0.4 MG CAPS Take 0.4 mg by mouth at bedtime.        No current facility-administered medications for this visit.    Allergies  Allergen Reactions  . Sudafed [Pseudoephedrine Hcl] Palpitations  . Cardizem [Diltiazem Hcl] Rash      Review of Systems:   General:  normal appetite, decreased energy, no weight gain, no weight loss, no fever  Cardiac:  + chest pain with exertion, no chest pain at rest, + SOB with exertion, no resting SOB, no PND, no orthopnea, + palpitations, no arrhythmia, no atrial fibrillation, no LE edema, + dizzy spells, no syncope  Respiratory:  + exertional shortness of breath, no home oxygen, no productive cough, no dry cough, no bronchitis, no wheezing, no hemoptysis, no asthma, no pain with inspiration or cough, no sleep  apnea, no CPAP at night  GI:   no difficulty swallowing, + reflux, no frequent heartburn, no hiatal hernia, no abdominal pain, no constipation, no diarrhea, no hematochezia, no hematemesis, no melena  GU:   no dysuria,  no frequency, no urinary tract infection, no hematuria, + enlarged prostate, + kidney stones, no kidney disease  Vascular:  no pain suggestive of claudication, no pain in feet, no leg cramps, no varicose veins, no DVT, no non-healing foot ulcer  Neuro:   + stroke, no TIA's, no seizures, no headaches, no temporary blindness one eye,  no slurred speech, no peripheral neuropathy, no chronic pain, no instability of gait, no memory/cognitive dysfunction  Musculoskeletal: + mild arthritis, no joint swelling, no myalgias, no difficulty walking, normal mobility   Skin:   no rash, no itching, no skin infections, no pressure sores or ulcerations  Psych:   no anxiety, no depression, no nervousness, no unusual recent stress  Eyes:   no blurry vision, + floaters, no recent vision changes, + wears glasses or contacts  ENT:   no hearing loss, edentulous with full dentures  Hematologic:  + easy bruising, no abnormal bleeding, no clotting disorder, occasional epistaxis  Endocrine:  no diabetes, does not check CBG's at home     Physical Exam:   BP 119/72  Pulse 55  Resp 20  Ht 6\' 3"  (1.905 m)  Wt 190 lb (86.183 kg)  BMI 23.75 kg/m2  SpO2 99%  General:  Thin,  well-appearing  HEENT:  Unremarkable   Neck:   no JVD, no bruits, no adenopathy   Chest:   clear to auscultation, symmetrical breath sounds, no wheezes, no rhonchi   CV:   RRR, grade III/VI systolic murmur   Abdomen:  soft, non-tender, no masses   Extremities:  warm, well-perfused, pulses diminished, no LE edema  Rectal/GU  Deferred  Neuro:   Grossly non-focal and symmetrical throughout  Skin:   Clean and dry, no rashes, no breakdown   Diagnostic Tests:  Transthoracic Echocardiography  Patient:    Plumer, Nein MR #:        [id number] Study Date: 05/26/2013 Gender:     M Age:        43 Height:     190.5cm Weight:     81.6kg  BSA:        2.58m^2 Pt. Status: Room:    Halina Andreas, [place]  [place], [place]  SONOGRAPHER  Cindy Hazy, RDCS  ATTENDING    Ena Dawley, M.D.  PERFORMING   [facility], Outpatient cc:  ------------------------------------------------------------ LV EF: 65% -   70%  ------------------------------------------------------------ Indications:      424.1 Aortic valve disorders.  ------------------------------------------------------------ History:   PMH:  Acquired from the patient and from the patient's chart.  PMH:  CVA. Bradycardia. Palpitations. Chest Pain.  Risk factors:  Former tobacco use. Hypertension. Dyslipidemia.  ------------------------------------------------------------ Study Conclusions  - Left ventricle: The cavity size was normal. Wall thickness   was increased in a pattern of moderate LVH. Systolic   function was vigorous. The estimated ejection fraction was   in the range of 65% to 70%. Wall motion was normal; there   were no regional wall motion abnormalities. Doppler   parameters are consistent with abnormal left ventricular   relaxation (grade 1 diastolic dysfunction). There was no   evidence of elevated ventricular filling pressure by   Doppler parameters. - Aortic valve: Trileaflet; moderately thickened, moderately   calcified leaflets. Cusp separation was moderately   reduced. There was severe stenosis. Trivial regurgitation.   Mean gradient: 47mm Hg (S). Peak gradient: 11mm Hg (S).   VTI ratio of LVOT to aortic valve: 0.25. - Aortic root: The aortic root was normal in size. - Mitral valve: Calcified posterior annulus. Trivial   regurgitation. - Left atrium: The atrium was at the upper limits of normal   in size. - Right ventricle: RV systolic pressure: 123XX123 Hg (S, est). - Right atrium: The atrium was mildly dilated. -  Tricuspid valve: Mild-moderate regurgitation. - Pulmonic valve: No regurgitation. - Pulmonary arteries: Systolic pressure was within the   normal range. Impressions:  - Compared to the prior study from 05/17/2012 the transaortic   gradient are significantly increased.  ------------------------------------------------------------ Labs, prior tests, procedures, and surgery: Echocardiography (May 28, 2012).    The aortic valve showed moderate stenosis with mean 23 mmHg and peak 44 mmHg. EF was 60-65%.  Transthoracic echocardiography.  M-mode, complete 2D, spectral Doppler, and color Doppler.  Height:  Height: 190.5cm. Height: 75in.  Weight:  Weight: 81.6kg. Weight: 179.6lb.  Body mass index:  BMI: 22.5kg/m^2.  Body surface area:    BSA: 2.30m^2.  Blood pressure:     122/62.  Patient status:  Outpatient.  Location:  [facility]  ------------------------------------------------------------  ------------------------------------------------------------ Left ventricle:  The cavity size was normal. Wall thickness was increased in a pattern of moderate LVH. There was moderate concentric hypertrophy. Systolic function was vigorous. The estimated ejection fraction was in the range of 65% to 70%. Wall motion was normal; there were no regional wall motion abnormalities. Doppler parameters are consistent with abnormal left ventricular relaxation (grade 1 diastolic dysfunction). There was no evidence of elevated ventricular filling pressure by Doppler parameters.  ------------------------------------------------------------ Aortic valve:   Trileaflet; moderately thickened, moderately calcified leaflets. Cusp separation was moderately reduced. Doppler:   There was severe stenosis.    Trivial regurgitation.    VTI ratio of LVOT to aortic valve: 0.25. Valve area: 0.88cm^2(VTI). Indexed valve area: 0.42cm^2/m^2 (VTI). Peak velocity ratio of LVOT to aortic valve: 0.3. Valve area: 1.02cm^2  (Vmax). Indexed valve area: 0.49cm^2/m^2 (Vmax).    Mean gradient: 69mm Hg (S). Peak gradient: 41mm Hg (S).  ------------------------------------------------------------ Aorta:  Aortic root: The aortic root was normal  in size.  ------------------------------------------------------------ Mitral valve:  Calcified posterior annulus. Mobility was not restricted.  Doppler:  Transvalvular velocity was within the normal range. There was no evidence for stenosis.  Trivial regurgitation.    Peak gradient: 11mm Hg (D).  ------------------------------------------------------------ Left atrium:  The atrium was at the upper limits of normal in size.  ------------------------------------------------------------ Right ventricle:  The cavity size was moderately dilated. Wall thickness was normal. Systolic function was normal.   ------------------------------------------------------------ Pulmonic valve:    Structurally normal valve.   Cusp separation was normal.  Doppler:  Transvalvular velocity was within the normal range. There was no evidence for stenosis.  No regurgitation.  ------------------------------------------------------------ Tricuspid valve:   Structurally normal valve.    Doppler: Transvalvular velocity was within the normal range. Mild-moderate regurgitation.  ------------------------------------------------------------ Pulmonary artery:   The main pulmonary artery was normal-sized. Systolic pressure was within the normal range.   ------------------------------------------------------------ Right atrium:  The atrium was mildly dilated.  ------------------------------------------------------------ Pericardium:  There was no pericardial effusion.  ------------------------------------------------------------ Systemic veins: Inferior vena cava: The vessel was normal in size.  ------------------------------------------------------------  2D measurements        Normal   Doppler measurements   Normal Left ventricle                 Main pulmonary LVID ED,   46.1 mm     43-52   artery chord,                         Pressure,    28 mm Hg  =30 PLAX                           S LVID ES,   24.8 mm     23-38   Left ventricle chord,                         Ea, lat    8.77 cm/s   ------ PLAX                           ann, tiss FS, chord,   46 %      >29     DP PLAX                           E/Ea, lat  8.72        ------ LVPW, ED   13.3 mm     ------  ann, tiss IVS/LVPW   1.19        <1.3    DP ratio, ED                      Ea, med    7.46 cm/s   ------ Ventricular septum             ann, tiss IVS, ED    15.8 mm     ------  DP LVOT                           E/Ea, med  10.2        ------ Diam, S      21 mm     ------  ann, tiss     5 Area       3.46 cm^2   ------  DP Diam         21 mm     ------  LVOT Aorta                          Peak vel,   116 cm/s   ------ Root diam,   35 mm     ------  S ED                             VTI, S       26 cm     ------ Left atrium                    Peak          5 mm Hg  ------ AP dim       38 mm     ------  gradient, AP dim     1.81 cm/m^2 <2.2    S index                          Stroke vol 90.1 ml     ------                                Stroke     42.9 ml/m^2 ------                                index                                Aortic valve                                Peak vel,   392 cm/s   ------                                S                                Mean vel,   300 cm/s   ------                                S                                VTI, S      102 cm     ------                                Mean         40 mm Hg  ------                                gradient,  S                                Peak         61 mm Hg  ------                                gradient,                                S                                VTI ratio  0.25         ------                                LVOT/AV                                Area, VTI  0.88 cm^2   ------                                Area index 0.42 cm^2/m ------                                (VTI)           ^2                                Peak vel    0.3        ------                                ratio,                                LVOT/AV                                Area, Vmax 1.02 cm^2   ------                                Area index 0.49 cm^2/m ------                                (Vmax)          ^2                                Mitral valve                                Peak E vel 76.5 cm/s   ------  Peak A vel  100 cm/s   ------                                Decelerati  211 ms     150-23                                on time                0                                Peak          2 mm Hg  ------                                gradient,                                D                                Peak E/A    0.8        ------                                ratio                                Tricuspid valve                                Regurg      252 cm/s   ------                                peak vel                                Peak RV-RA   25 mm Hg  ------                                gradient,                                S                                Max regurg  252 cm/s   ------                                vel                                Systemic  veins                                Estimated     3 mm Hg  ------                                CVP                                Right ventricle                                Pressure,    28 mm Hg  <30                                S                                Sa vel,    15.2 cm/s   ------                                lat ann,                                tiss DP    ------------------------------------------------------------ Prepared and Electronically Authenticated by  Ena Dawley, M.D. 2015-03-23T09:[id number]     Cardiac Catheterization Procedure Note  Name: Sean Brock MRN: [id number] DOB: 10/14/1934  Procedure: Right Heart Cath,Selective Coronary Angiography  Indication: Severe aortic stenosis         Procedural Details: Allen's test was positive on the right. There was an IV in a brachial vein.  This was replaced with a 5 French venous sheath.  A Swan-Ganz catheter was used for the right heart catheterization. Standard protocol was followed for recording of right heart pressures and sampling of oxygen saturations. Fick cardiac output was calculated. The right rdial area was sterilely prepped and draped.  A 5 French sheath was placed in the right radial artery using modificed Seldinger technique.  The patient received 3 mg IA verapamil and weight-based IV heparin.  The right radial Standard Judkins catheters were used for selective coronary angiography and left ventriculography. There were no immediate procedural complications. The patient was transferred to the post catheterization recovery area for further monitoring.  Procedural Findings: Hemodynamics (mmHg) RA mean 1 RV 30/5 PA 23/10 PCWP mean 9 AO 129/63  Oxygen saturations: PA 73% AO 96%  Cardiac Output (Fick) 6.35  Cardiac Index (Fick) 2.95          Coronary angiography: Coronary dominance: right  Very tortuous vessels  Left mainstem: 30% distal LM.  Left anterior descending (LAD): 40-50% ostial LAD stenosis. 30% proximal LAD stenosis at D1 (focal).  Moderate D1 with 80-90% ostial stenosis.    Left circumflex (LCx): 40% proximal LCx after OM1.  OM1 moderate, very tortuous but probably no significant stenosis.    Right coronary artery (RCA): Total occlusion with left to right collaterals.    Left ventriculography: Not done, known severe AS.    Final  Conclusions:  Low to normal filling pressures.  Patient has a totally occluded RCA with collaterals.  He says that this was seen on prior cardiac cath about 20 years ago.  He has disease in the LCA system but nonobstructive.  Will refer to CVTS for AVR with severe AS.   [name] 06/06/2013, 9:00 AM     Impression:  The patient has severe symptomatic aortic stenosis with preserved left ventricular systolic function and moderate coronary artery disease with chronic total occlusion of the right coronary artery.  The patient has long-standing history of intermittent dizzy spells that may or may not be related to his aortic stenosis, but he clearly describes progressive symptoms of exertional shortness of breath and intermittent chest tightness as well as worsening fatigue, currently with symptoms consistent with functional class II or III chronic diastolic congestive heart failure and angina.  Risks associated with conventional surgery will be slightly elevated because of the patient's age and history of previous stroke. However, I would agree that the patient should be treated using conventional surgical aortic valve replacement with coronary artery bypass grafting.   Plan:  The patient was counseled at length regarding surgical alternatives with respect to aortic valve replacement including continued medical therapy versus proceeding with conventional surgical aortic valve replacement using either a mechanical prosthesis or a bioprosthetic tissue valve.  Other alternatives including stentless bioprosthetic tissue valve replacement and transcatheter aortic valve replacement were discussed.  Discussion was held comparing the relative risks of mechanical valve replacement with need for lifelong anticoagulation versus use of a bioprosthetic tissue valve and the associated potential for late structural valve deterioration in failure.  This discussion was placed in the context of the patient's particular  circumstances, and as a result the patient specifically requests that their valve be replaced using a bioprosthetic tissue valve.  The rationale for concomitant coronary artery bypass grafting was also discussed.  The patient understands and accepts all potential associated risks of surgery including but not limited to risk of death, stroke, myocardial infarction, congestive heart failure, respiratory failure, renal failure, pneumonia, bleeding requiring blood transfusion and or reexploration, arrhythmia, heart block or bradycardia requiring permanent pacemaker, aortic dissection or other major vascular complication, pleural effusions or other delayed complications related to continued congestive heart failure, and other late complications related to valve replacement including structural valve deterioration and failure, thrombosis, endocarditis, or paravalvular leak.  We tentatively plan to proceed with surgery on Thursday, 07/10/2013. The patient has been instructed to stop taking Plavix in anticipation of surgery. He will return for followup on Monday, 07/07/2013 prior to surgery. We will obtain CT angiogram of the chest to rule out the possibility of significant aneurysmal enlargement or atherosclerotic disease involving the ascending thoracic aorta and aortic root.     I spent in excess of 90 minutes during the conduct of this office consultation and >50% of this time involved direct face-to-face encounter with the patient for counseling and/or coordination of their care.   [name]. Roxy Manns, MD 06/17/2013 4:54 PM

## 2013-06-18 ENCOUNTER — Other Ambulatory Visit: Payer: Self-pay | Admitting: *Deleted

## 2013-06-18 DIAGNOSIS — I712 Thoracic aortic aneurysm, without rupture, unspecified: Secondary | ICD-10-CM

## 2013-06-18 DIAGNOSIS — I359 Nonrheumatic aortic valve disorder, unspecified: Secondary | ICD-10-CM

## 2013-06-18 DIAGNOSIS — I2510 Atherosclerotic heart disease of native coronary artery without angina pectoris: Secondary | ICD-10-CM

## 2013-06-23 LAB — MDC_IDC_ENUM_SESS_TYPE_REMOTE

## 2013-06-26 ENCOUNTER — Ambulatory Visit
Admission: RE | Admit: 2013-06-26 | Discharge: 2013-06-26 | Disposition: A | Discharge status: Home / Self Care | Payer: Medicare Other | Source: Ambulatory Visit | Attending: Thoracic Surgery (Cardiothoracic Vascular Surgery) | Admitting: Thoracic Surgery (Cardiothoracic Vascular Surgery)

## 2013-06-26 DIAGNOSIS — I712 Thoracic aortic aneurysm, without rupture, unspecified: Secondary | ICD-10-CM

## 2013-06-26 DIAGNOSIS — I359 Nonrheumatic aortic valve disorder, unspecified: Secondary | ICD-10-CM

## 2013-06-26 MED ORDER — IOHEXOL 350 MG/ML SOLN
80.0000 mL | Freq: Once | INTRAVENOUS | Status: AC | PRN
Start: 2013-06-26 — End: 2013-06-26
  Administered 2013-06-26: 80 mL via INTRAVENOUS

## 2013-07-07 ENCOUNTER — Encounter (HOSPITAL_COMMUNITY)
Admission: RE | Admit: 2013-07-07 | Discharge: 2013-07-07 | Disposition: A | Discharge status: Home / Self Care | Payer: Medicare Other | Source: Ambulatory Visit | Attending: Thoracic Surgery (Cardiothoracic Vascular Surgery) | Admitting: Thoracic Surgery (Cardiothoracic Vascular Surgery)

## 2013-07-07 ENCOUNTER — Encounter: Payer: Self-pay | Admitting: Thoracic Surgery (Cardiothoracic Vascular Surgery)

## 2013-07-07 ENCOUNTER — Ambulatory Visit (INDEPENDENT_AMBULATORY_CARE_PROVIDER_SITE_OTHER): Payer: Medicare Other | Admitting: Thoracic Surgery (Cardiothoracic Vascular Surgery)

## 2013-07-07 ENCOUNTER — Ambulatory Visit (HOSPITAL_COMMUNITY)
Admission: RE | Admit: 2013-07-07 | Discharge: 2013-07-07 | Disposition: A | Discharge status: Home / Self Care | Payer: Medicare Other | Source: Ambulatory Visit | Attending: Thoracic Surgery (Cardiothoracic Vascular Surgery) | Admitting: Thoracic Surgery (Cardiothoracic Vascular Surgery)

## 2013-07-07 ENCOUNTER — Encounter (HOSPITAL_COMMUNITY): Payer: Self-pay

## 2013-07-07 VITALS — BP 158/80 | HR 53 | Temp 97.5°F | Resp 20 | Ht 75.0 in | Wt 198.0 lb

## 2013-07-07 VITALS — BP 102/63 | HR 55 | Resp 16 | Ht 75.0 in | Wt 190.0 lb

## 2013-07-07 DIAGNOSIS — I359 Nonrheumatic aortic valve disorder, unspecified: Secondary | ICD-10-CM

## 2013-07-07 DIAGNOSIS — I2510 Atherosclerotic heart disease of native coronary artery without angina pectoris: Secondary | ICD-10-CM | POA: Insufficient documentation

## 2013-07-07 DIAGNOSIS — I350 Nonrheumatic aortic (valve) stenosis: Secondary | ICD-10-CM

## 2013-07-07 DIAGNOSIS — Z01810 Encounter for preprocedural cardiovascular examination: Secondary | ICD-10-CM

## 2013-07-07 HISTORY — DX: Personal history of urinary calculi: Z87.442

## 2013-07-07 LAB — COMPREHENSIVE METABOLIC PANEL
ALK PHOS: 39 U/L (ref 39–117)
ALT: 12 U/L (ref 0–53)
AST: 18 U/L (ref 0–37)
Albumin: 4 g/dL (ref 3.5–5.2)
BUN: 19 mg/dL (ref 6–23)
CO2: 18 mEq/L — ABNORMAL LOW (ref 19–32)
Calcium: 9.1 mg/dL (ref 8.4–10.5)
Chloride: 103 mEq/L (ref 96–112)
Creatinine, Ser: 0.76 mg/dL (ref 0.50–1.35)
GFR calc non Af Amer: 86 mL/min — ABNORMAL LOW (ref >90)
GLUCOSE: 98 mg/dL (ref 70–99)
POTASSIUM: 4.4 meq/L (ref 3.7–5.3)
Sodium: 139 mEq/L (ref 137–147)
Total Bilirubin: 0.6 mg/dL (ref 0.3–1.2)
Total Protein: 7 g/dL (ref 6.0–8.3)

## 2013-07-07 LAB — SURGICAL PCR SCREEN
MRSA, PCR: NEGATIVE
Staphylococcus aureus: NEGATIVE

## 2013-07-07 LAB — BLOOD GAS, ARTERIAL
Acid-Base Excess: 0.9 mmol/L (ref 0.0–2.0)
BICARBONATE: 24.8 meq/L — AB (ref 20.0–24.0)
Drawn by: 344381
FIO2: 0.21 %
O2 Saturation: 97.7 %
PH ART: 7.431 (ref 7.350–7.450)
PO2 ART: 96.5 mmHg (ref 80.0–100.0)
Patient temperature: 98.6
TCO2: 25.9 mmol/L (ref 0–100)
pCO2 arterial: 37.9 mmHg (ref 35.0–45.0)

## 2013-07-07 LAB — URINALYSIS, ROUTINE W REFLEX MICROSCOPIC
Bilirubin Urine: NEGATIVE
Glucose, UA: NEGATIVE mg/dL
Hgb urine dipstick: NEGATIVE
KETONES UR: NEGATIVE mg/dL
LEUKOCYTES UA: NEGATIVE
Nitrite: NEGATIVE
PH: 5.5 (ref 5.0–8.0)
PROTEIN: NEGATIVE mg/dL
Specific Gravity, Urine: 1.028 (ref 1.005–1.030)
UROBILINOGEN UA: 1 mg/dL (ref 0.0–1.0)

## 2013-07-07 LAB — APTT: aPTT: 34 seconds (ref 24–37)

## 2013-07-07 LAB — PROTIME-INR
INR: 1.06 (ref 0.00–1.49)
Prothrombin Time: 13.6 seconds (ref 11.6–15.2)

## 2013-07-07 LAB — CBC
HCT: 42.8 % (ref 39.0–52.0)
HEMOGLOBIN: 14.7 g/dL (ref 13.0–17.0)
MCH: 30.2 pg (ref 26.0–34.0)
MCHC: 34.3 g/dL (ref 30.0–36.0)
MCV: 87.9 fL (ref 78.0–100.0)
PLATELETS: 195 10*3/uL (ref 150–400)
RBC: 4.87 MIL/uL (ref 4.22–5.81)
RDW: 13.5 % (ref 11.5–15.5)
WBC: 6.6 10*3/uL (ref 4.0–10.5)

## 2013-07-07 LAB — TYPE AND SCREEN
ABO/RH(D): A POS
ANTIBODY SCREEN: NEGATIVE

## 2013-07-07 LAB — HEMOGLOBIN A1C
Hgb A1c MFr Bld: 5.6 % (ref <5.7)
Mean Plasma Glucose: 114 mg/dL (ref <117)

## 2013-07-07 LAB — ABO/RH: ABO/RH(D): A POS

## 2013-07-07 NOTE — Progress Notes (Signed)
07/07/13 1307  OBSTRUCTIVE SLEEP APNEA  Have you ever been diagnosed with sleep apnea through a sleep study? No  Do you snore loudly (loud enough to be heard through closed doors)?  1  Do you often feel tired, fatigued, or sleepy during the daytime? 1  Has anyone observed you stop breathing during your sleep? 0  Do you have, or are you being treated for high blood pressure? 1  BMI more than 35 kg/m2? 0  Age over 77 years old? 1  Neck circumference greater than 40 cm/16 inches? 0  Gender: 1  Obstructive Sleep Apnea Score 5  Score 4 or greater  Results sent to PCP

## 2013-07-07 NOTE — H&P (Signed)
[address]       [place] [address]             [phone]          CARDIOTHORACIC SURGERY HISTORY AND PHYSICAL EXAM  Referring Provider is [facility], Elby Showers, MD PCP is Simona Huh, MD    Chief Complaint   Patient presents with   .  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 The patient's cardiac disease dates back more than 20 years ago at which time he underwent diagnostic cardiac catheterization demonstrating single-vessel coronary artery disease. He was treated medically and followed for years by Dr. Olevia Perches.  He developed a heart murmur on physical exam and was found to have aortic stenosis which was followed using serial transthoracic echocardiograms.  In March of 2014 the patient suffered a minor stroke manifested by symptoms of left-sided facial droop with aphasia.  Symptoms resolved rapidly but MRI revealed evidence of small right parietal stroke, potentially related to embolization. Carotid duplex scan did not reveal significant cerebrovascular disease. The patient recently had a loop recorder placed to rule out the possibility of occult paroxysmal atrial fibrillation, but so far of the loop recorder has not identified any significant arrhythmias.  Over the past year or two the patient has been followed by Dr. Aundra Dubin. During this time the patient has developed decreased exercise tolerance with progressive fatigue as well as intermittent exertional shortness of breath and chest tightness. The patient additionally describes an intermittent history of dizzy spells dating back several years which has been intermittently attributed to vertigo.  Followup  transthoracic echocardiogram performed 05/26/2013 demonstrated significant progression of the severity of aortic stenosis with peak velocity across the aortic valve measured greater than 4 m/s corresponding to peak and mean transvalvular gradients of 61 and 40 mm mercury respectively.  The patient subsequently underwent left and right heart catheterization which revealed the presence of multivessel coronary artery disease including 100% chronic total occlusion of the right coronary artery with left to right collateral filling of the terminal branches of the right coronary system. There was moderate nonobstructive disease in the left coronary system with exception of 80-90% focal ostial stenosis of a small diagonal branch. Pulmonary artery pressures were normal. Transvalvular gradient across the aortic valve was not assessed at catheterization. The patient was referred for possible elective surgical intervention.  He was originally seen in consultation on 06/17/2013 and he returns for followup today with tentatively plans to proceed with elective aortic valve replacement and coronary artery bypass grafting later this week. Over the last 2 weeks he reports no new problems or complaints. He has had a few dizzy spells without syncope. He has not had increased symptoms of chest pain or shortness of breath. The remainder of his review of systems is unchanged from previously.  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 He states that he had been reasonably active for most of his life although he admits that he has been less active over the past 2 years, with considerable progression of decreased activity over the past few months.  He describes a long history of intermittent dizzy spells  without syncope. These or not necessarily related to physical activity or position. He thinks his dizzy spells may be worse when his blood pressure is running high rather than low. He has  had some intermittent exertional chest tightness and shortness of breath. He has significantly decreased energy with progressive fatigue. He denies any history of PND, orthopnea or lower extremity edema.  Last week while walking to the mailbox he developed somewhat acute onset of shortness breath and dizziness which prompted him to present to the emergency department. Electrocardiogram show unrevealing and he ruled out for acute myocardial infarction. His loop recorder was interrogated and did not reveal any signs of arrhythmia. His symptoms resolved very quickly.   Past Medical History  Diagnosis Date  . HYPERLIPIDEMIA   . HYPERTENSION   . AORTIC STENOSIS   . Former smoker   . DYSPEPSIA   . Palpitations     a. Event monitor (5/13) with occasional PVCs, PACs but no significant arrhythmia.   Marland Kitchen CAD (coronary artery disease)   . GERD (gastroesophageal reflux disease)   . Trigeminal neuropathy   . Arthritis     BACK  . ED (erectile dysfunction)   . CVA (cerebral infarction)     a. 3/14 right parietal infarct. Carotid US 3/14 showed no significant disease. He has an implanted loop recorder to look for atrial fibrillation.   . Vertigo   . Sinus bradycardia     a. 2013 - HR 40's in the office. BB stopped.  Marland Kitchen History of kidney stones     Past Surgical History  Procedure Laterality Date  . Cataract extraction    . Lithotripsy    . Mouth surgery    . Inguinal hernia repair Right 01/15/2013    Procedure: HERNIA REPAIR INGUINAL ADULT;  Surgeon: Shelly Rubenstein, MD;  Location: [facility];  Service: General;  Laterality: Right;  . Insertion of mesh Right 01/15/2013    Procedure: INSERTION OF MESH;  Surgeon: Shelly Rubenstein, MD;  Location: [facility];  Service: General;  Laterality: Right;  . Loop recorder implant  04/15/2013    MDT LinQ implanred by Dr Ladona Ridgel for cryptogenic stroke    Family History  Problem Relation Age of Onset  . Diabetes    .  Hypertension    . Lung cancer    . Cancer Sister     breast    Social History History  Substance Use Topics  . Smoking status: Former Smoker -- 1.00 packs/day for 30 years    Types: Cigarettes    Quit date: 08/29/2009  . Smokeless tobacco: Never Used  . Alcohol Use: No    Prior to Admission medications   Medication Sig Start Date End Date Taking? Authorizing Provider  acetaminophen (TYLENOL) 500 MG tablet Take 1,000 mg by mouth every 6 (six) hours as needed for mild pain.   Yes Historical Provider, MD  amLODipine (NORVASC) 10 MG tablet Take 10 mg by mouth daily.   Yes Historical Provider, MD  Multiple Vitamin (MULTIVITAMIN WITH MINERALS) TABS tablet Take 1 tablet by mouth daily.   Yes Historical Provider, MD  omeprazole (PRILOSEC) 20 MG capsule Take 20 mg by mouth daily as needed (for acid reduction).    Yes Historical Provider, MD  Polyethyl Glycol-Propyl Glycol (SYSTANE OP) Place 1 drop into both eyes daily as needed (for dry eyes).    Yes Historical Provider, MD  rosuvastatin (CRESTOR) 10 MG tablet Take 10 mg by mouth daily.  Yes Historical Provider, MD  Tamsulosin HCl (FLOMAX) 0.4 MG CAPS Take 0.4 mg by mouth at bedtime.    Yes Historical Provider, MD    Allergies  Allergen Reactions  . Sudafed [Pseudoephedrine Hcl] Palpitations  . Cardizem [Diltiazem Hcl] Rash     Review of Systems:              General:                      normal appetite, decreased energy, no weight gain, no weight loss, no fever             Cardiac:                      + chest pain with exertion, no chest pain at rest, + SOB with exertion, no resting SOB, no PND, no orthopnea, + palpitations, no arrhythmia, no atrial fibrillation, no LE edema, + dizzy spells, no syncope             Respiratory:                + exertional shortness of breath, no home oxygen, no productive cough, no dry cough, no bronchitis, no wheezing, no hemoptysis, no asthma, no pain with inspiration or cough, no sleep apnea, no  CPAP at night             GI:                                no difficulty swallowing, + reflux, no frequent heartburn, no hiatal hernia, no abdominal pain, no constipation, no diarrhea, no hematochezia, no hematemesis, no melena             GU:                              no dysuria,  no frequency, no urinary tract infection, no hematuria, + enlarged prostate, + kidney stones, no kidney disease             Vascular:                     no pain suggestive of claudication, no pain in feet, no leg cramps, no varicose veins, no DVT, no non-healing foot ulcer             Neuro:                         + stroke, no TIA's, no seizures, no headaches, no temporary blindness one eye,  no slurred speech, no peripheral neuropathy, no chronic pain, no instability of gait, no memory/cognitive dysfunction             Musculoskeletal:         + mild arthritis, no joint swelling, no myalgias, no difficulty walking, normal mobility               Skin:                            no rash, no itching, no skin infections, no pressure sores or ulcerations             Psych:  no anxiety, no depression, no nervousness, no unusual recent stress             Eyes:                           no blurry vision, + floaters, no recent vision changes, + wears glasses or contacts             ENT:                            no hearing loss, edentulous with full dentures             Hematologic:               + easy bruising, no abnormal bleeding, no clotting disorder, occasional epistaxis             Endocrine:                   no diabetes, does not check CBG's at home                           Physical Exam:              BP 119/72  Pulse 55  Resp 20  Ht 6\' 3"  (1.905 m)  Wt 190 lb (86.183 kg)  BMI 23.75 kg/m2  SpO2 99%             General:                      Thin,  well-appearing             HEENT:                       Unremarkable               Neck:                           no JVD, no bruits, no  adenopathy               Chest:                         clear to auscultation, symmetrical breath sounds, no wheezes, no rhonchi               CV:                              RRR, grade III/VI systolic murmur               Abdomen:                    soft, non-tender, no masses               Extremities:                 warm, well-perfused, pulses diminished, no LE edema             Rectal/GU                   Deferred             Neuro:  Grossly non-focal and symmetrical throughout             Skin:                            Clean and dry, no rashes, no breakdown   Diagnostic Tests:  Transthoracic Echocardiography  Patient:    Sean Brock, Sean Brock MR #:       [id number] Study Date: 05/26/2013 Gender:     M Age:        53 Height:     190.5cm Weight:     81.6kg BSA:        2.69m^2 Pt. Status: Room:    Halina Andreas, [place]  [place], [place]  SONOGRAPHER  Cindy Hazy, RDCS  ATTENDING    Ena Dawley, M.D.  PERFORMING   [facility], Outpatient cc:  ------------------------------------------------------------ LV EF: 65% -   70%  ------------------------------------------------------------ Indications:      424.1 Aortic valve disorders.  ------------------------------------------------------------ History:   PMH:  Acquired from the patient and from the patient's chart.  PMH:  CVA. Bradycardia. Palpitations. Chest Pain.  Risk factors:  Former tobacco use. Hypertension. Dyslipidemia.  ------------------------------------------------------------ Study Conclusions  - Left ventricle: The cavity size was normal. Wall thickness   was increased in a pattern of moderate LVH. Systolic   function was vigorous. The estimated ejection fraction was   in the range of 65% to 70%. Wall motion was normal; there   were no regional wall motion abnormalities. Doppler   parameters are consistent with abnormal left ventricular   relaxation (grade 1  diastolic dysfunction). There was no   evidence of elevated ventricular filling pressure by   Doppler parameters. - Aortic valve: Trileaflet; moderately thickened, moderately   calcified leaflets. Cusp separation was moderately   reduced. There was severe stenosis. Trivial regurgitation.   Mean gradient: 55mm Hg (S). Peak gradient: 52mm Hg (S).   VTI ratio of LVOT to aortic valve: 0.25. - Aortic root: The aortic root was normal in size. - Mitral valve: Calcified posterior annulus. Trivial   regurgitation. - Left atrium: The atrium was at the upper limits of normal   in size. - Right ventricle: RV systolic pressure: 123XX123 Hg (S, est). - Right atrium: The atrium was mildly dilated. - Tricuspid valve: Mild-moderate regurgitation. - Pulmonic valve: No regurgitation. - Pulmonary arteries: Systolic pressure was within the   normal range. Impressions:  - Compared to the prior study from 05/17/2012 the transaortic   gradient are significantly increased.  ------------------------------------------------------------ Labs, prior tests, procedures, and surgery: Echocardiography (May 28, 2012).    The aortic valve showed moderate stenosis with mean 23 mmHg and peak 44 mmHg. EF was 60-65%.  Transthoracic echocardiography.  M-mode, complete 2D, spectral Doppler, and color Doppler.  Height:  Height: 190.5cm. Height: 75in.  Weight:  Weight: 81.6kg. Weight: 179.6lb.  Body mass index:  BMI: 22.5kg/m^2.  Body surface area:    BSA: 2.71m^2.  Blood pressure:     122/62.  Patient status:  Outpatient.  Location:  [facility]  ------------------------------------------------------------  ------------------------------------------------------------ Left ventricle:  The cavity size was normal. Wall thickness was increased in a pattern of moderate LVH. There was moderate concentric hypertrophy. Systolic function was vigorous. The estimated ejection fraction was in the range of 65% to 70%. Wall  motion was normal; there were no regional wall motion abnormalities. Doppler parameters are consistent with abnormal left ventricular relaxation (  grade 1 diastolic dysfunction). There was no evidence of elevated ventricular filling pressure by Doppler parameters.  ------------------------------------------------------------ Aortic valve:   Trileaflet; moderately thickened, moderately calcified leaflets. Cusp separation was moderately reduced. Doppler:   There was severe stenosis.    Trivial regurgitation.    VTI ratio of LVOT to aortic valve: 0.25. Valve area: 0.88cm^2(VTI). Indexed valve area: 0.42cm^2/m^2 (VTI). Peak velocity ratio of LVOT to aortic valve: 0.3. Valve area: 1.02cm^2 (Vmax). Indexed valve area: 0.49cm^2/m^2 (Vmax).    Mean gradient: 15mm Hg (S). Peak gradient: 8mm Hg (S).  ------------------------------------------------------------ Aorta:  Aortic root: The aortic root was normal in size.  ------------------------------------------------------------ Mitral valve:  Calcified posterior annulus. Mobility was not restricted.  Doppler:  Transvalvular velocity was within the normal range. There was no evidence for stenosis.  Trivial regurgitation.    Peak gradient: 71mm Hg (D).  ------------------------------------------------------------ Left atrium:  The atrium was at the upper limits of normal in size.  ------------------------------------------------------------ Right ventricle:  The cavity size was moderately dilated. Wall thickness was normal. Systolic function was normal.   ------------------------------------------------------------ Pulmonic valve:    Structurally normal valve.   Cusp separation was normal.  Doppler:  Transvalvular velocity was within the normal range. There was no evidence for stenosis.  No regurgitation.  ------------------------------------------------------------ Tricuspid valve:   Structurally normal valve.    Doppler: Transvalvular  velocity was within the normal range. Mild-moderate regurgitation.  ------------------------------------------------------------ Pulmonary artery:   The main pulmonary artery was normal-sized. Systolic pressure was within the normal range.   ------------------------------------------------------------ Right atrium:  The atrium was mildly dilated.  ------------------------------------------------------------ Pericardium:  There was no pericardial effusion.  ------------------------------------------------------------ Systemic veins: Inferior vena cava: The vessel was normal in size.  ------------------------------------------------------------  2D measurements        Normal  Doppler measurements   Normal Left ventricle                 Main pulmonary LVID ED,   46.1 mm     43-52   artery chord,                         Pressure,    28 mm Hg  =30 PLAX                           S LVID ES,   24.8 mm     23-38   Left ventricle chord,                         Ea, lat    8.77 cm/s   ------ PLAX                           ann, tiss FS, chord,   46 %      >29     DP PLAX                           E/Ea, lat  8.72        ------ LVPW, ED   13.3 mm     ------  ann, tiss IVS/LVPW   1.19        <1.3    DP ratio, ED                      Ea, med  7.46 cm/s   ------ Ventricular septum             ann, tiss IVS, ED    15.8 mm     ------  DP LVOT                           E/Ea, med  10.2        ------ Diam, S      21 mm     ------  ann, tiss     5 Area       3.46 cm^2   ------  DP Diam         21 mm     ------  LVOT Aorta                          Peak vel,   116 cm/s   ------ Root diam,   35 mm     ------  S ED                             VTI, S       26 cm     ------ Left atrium                    Peak          5 mm Hg  ------ AP dim       38 mm     ------  gradient, AP dim     1.81 cm/m^2 <2.2    S index                          Stroke vol 90.1 ml     ------                                 Stroke     42.9 ml/m^2 ------                                index                                Aortic valve                                Peak vel,   392 cm/s   ------                                S                                Mean vel,   300 cm/s   ------                                S  VTI, S      102 cm     ------                                Mean         40 mm Hg  ------                                gradient,                                S                                Peak         61 mm Hg  ------                                gradient,                                S                                VTI ratio  0.25        ------                                LVOT/AV                                Area, VTI  0.88 cm^2   ------                                Area index 0.42 cm^2/m ------                                (VTI)           ^2                                Peak vel    0.3        ------                                ratio,                                LVOT/AV                                Area, Vmax 1.02 cm^2   ------  Area index 0.49 cm^2/m ------                                (Vmax)          ^2                                Mitral valve                                Peak E vel 76.5 cm/s   ------                                Peak A vel  100 cm/s   ------                                Decelerati  211 ms     150-23                                on time                0                                Peak          2 mm Hg  ------                                gradient,                                D                                Peak E/A    0.8        ------                                ratio                                Tricuspid valve                                Regurg      252 cm/s   ------                                peak vel                                Peak  RV-RA   25 mm Hg  ------  gradient,                                S                                Max regurg  252 cm/s   ------                                vel                                Systemic veins                                Estimated     3 mm Hg  ------                                CVP                                Right ventricle                                Pressure,    28 mm Hg  <30                                S                                Sa vel,    15.2 cm/s   ------                                lat ann,                                tiss DP   ------------------------------------------------------------ Prepared and Electronically Authenticated by  Ena Dawley, M.D. [id number]:[id number]     Cardiac Catheterization Procedure Note  Name: Sean Brock MRN: [id number] DOB: [birth year]  Procedure: Right Heart Cath,Selective Coronary Angiography  Indication: Severe aortic stenosis         Procedural Details: Allen's test was positive on the right. There was an IV in a brachial vein.  This was replaced with a 5 French venous sheath.  A Swan-Ganz catheter was used for the right heart catheterization. Standard protocol was followed for recording of right heart pressures and sampling of oxygen saturations. Fick cardiac output was calculated. The right rdial area was sterilely prepped and draped.  A 5 French sheath was placed in the right radial artery using modificed Seldinger technique.  The patient received 3 mg IA verapamil and weight-based IV heparin.  The right radial Standard Judkins catheters were used for selective coronary angiography and left ventriculography. There were no immediate procedural complications. The patient was transferred to the post catheterization recovery area for  further monitoring.  Procedural Findings: Hemodynamics (mmHg) RA mean 1 RV 30/5 PA 23/10 PCWP mean 9 AO 129/63  Oxygen  saturations: PA 73% AO 96%  Cardiac Output (Fick) 6.35   Cardiac Index (Fick) 2.95          Coronary angiography: Coronary dominance: right  Very tortuous vessels  Left mainstem: 30% distal LM.  Left anterior descending (LAD): 40-50% ostial LAD stenosis. 30% proximal LAD stenosis at D1 (focal).  Moderate D1 with 80-90% ostial stenosis.    Left circumflex (LCx): 40% proximal LCx after OM1.  OM1 moderate, very tortuous but probably no significant stenosis.    Right coronary artery (RCA): Total occlusion with left to right collaterals.    Left ventriculography: Not done, known severe AS.    Final Conclusions:  Low to normal filling pressures.  Patient has a totally occluded RCA with collaterals.  He says that this was seen on prior cardiac cath about 20 years ago.  He has disease in the LCA system but nonobstructive.  Will refer to CVTS for AVR with severe AS.   [name] 06/06/2013, 9:00 AM   CT ANGIOGRAPHY CHEST WITH CONTRAST   TECHNIQUE: Multidetector CT imaging of the chest was performed using the standard protocol during bolus administration of intravenous contrast. Multiplanar CT image reconstructions and MIPs were obtained to evaluate the vascular anatomy.   CONTRAST:  67mL OMNIPAQUE IOHEXOL 350 MG/ML SOLN   COMPARISON:  Prior chest x-ray 06/12/2013   FINDINGS: Mediastinum: Tiny 3 mm low-attenuation nodule in the left thyroid gland is likely clinically insignificant no mediastinal mass or suspicious adenopathy. Unremarkable thoracic esophagus.   Heart/Vascular: Normal caliber aorta. There is a borderline ectasia of the ascending aortic segment with a maximal transverse diameter of 3.8 cm. The aortic root remains within normal limits at 3.4 cm in diameter. No effacement of the sino-tubular junction. Four vessel aortic arch configuration. The left vertebral artery arises directly from the arch. Atherosclerotic vascular calcifications without evidence of  significant stenosis. Normal caliber main and central pulmonary arteries. No definite central pulmonary embolus. The heart is enlarged. Atherosclerotic calcifications noted in the left anterior descending, circumflex and right coronary arteries. The aortic valve is thickened and partially calcified. No pericardial effusion.   Lungs/Pleura: The lungs are clear.  No pleural effusion.   Bones/Soft Tissues: No acute fracture or aggressive appearing lytic or blastic osseous lesion. Degenerative osteoarthritis of the bilateral glenohumeral joints noted incidentally.   Upper Abdomen: Visualized upper abdominal organs are unremarkable.   Review of the MIP images confirms the above findings.   IMPRESSION: 1. Ectasia of the ascending thoracic aorta to a maximal diameter of 3.8 cm. 2. Thickened and calcified aortic valve. If there is clinical concern for underlying aortic valvular pathology (stenosis or incompetence, echocardiography could further evaluate. 3. Atherosclerosis including multivessel coronary artery disease. Please note that although the presence of coronary artery calcium documents the presence of coronary artery disease, the severity of this disease and any potential stenosis cannot be assessed on this non-gated CT examination. Assessment for potential risk factor modification, dietary therapy or pharmacologic therapy may be warranted, if clinically indicated. 4. Four vessel aortic arch configuration. The left vertebral artery arises directly from the aorta. Signed,   Criselda Peaches, MD   [facility]   [facility]     Electronically Signed   By: Jacqulynn Cadet M.D.   On: 06/26/2013 17:21      Impression:  The patient has severe symptomatic  aortic stenosis with preserved left ventricular systolic function and moderate coronary artery disease with chronic total occlusion of the right coronary artery.  The  patient has long-standing history of intermittent dizzy spells that may or may not be related to his aortic stenosis, but he clearly describes progressive symptoms of exertional shortness of breath and intermittent chest tightness as well as worsening fatigue, currently with symptoms consistent with functional class II or III chronic diastolic congestive heart failure and angina.  CT angiogram of the chest demonstrates mild ectasia of the ascending thoracic aorta without significant aneurysmal enlargement or calcification.  Risks associated with conventional surgery will be slightly elevated because of the patient's age and history of previous stroke. However, I would agree that the patient should be treated using conventional surgical aortic valve replacement with coronary artery bypass grafting.     Plan:  The patient was counseled at length regarding surgical alternatives with respect to aortic valve replacement including continued medical therapy versus proceeding with conventional surgical aortic valve replacement using either a mechanical prosthesis or a bioprosthetic tissue valve.  Other alternatives including stentless bioprosthetic tissue valve replacement and transcatheter aortic valve replacement were discussed.  Discussion was held comparing the relative risks of mechanical valve replacement with need for lifelong anticoagulation versus use of a bioprosthetic tissue valve and the associated potential for late structural valve deterioration in failure.  This discussion was placed in the context of the patient's particular circumstances, and as a result the patient specifically requests that their valve be replaced using a bioprosthetic tissue valve.  The rationale for concomitant coronary artery bypass grafting was also discussed.  The patient understands and accepts all potential associated risks of surgery including but not limited to risk of death, stroke, myocardial infarction, congestive heart  failure, respiratory failure, renal failure, pneumonia, bleeding requiring blood transfusion and or reexploration, arrhythmia, heart block or bradycardia requiring permanent pacemaker, aortic dissection or other major vascular complication, pleural effusions or other delayed complications related to continued congestive heart failure, and other late complications related to valve replacement including structural valve deterioration and failure, thrombosis, endocarditis, or paravalvular leak.  We tentatively plan to proceed with surgery on Friday, 07/11/2013. The patient has been instructed to stop taking Plavix in anticipation of surgery.       [name]. Roxy Manns, MD

## 2013-07-07 NOTE — Progress Notes (Signed)
Pre-op Cardiac Surgery  Carotid Findings:  Right: 1-39% ICA stenosis.  Left: 60-79% ICA stenosis.  Bilateral vertebral artery flow is antegrade.  Upper Extremity Right Left  Brachial Pressures 128T 135T  Radial Waveforms T T  Ulnar Waveforms T T  Palmar Arch (Allen's Test) WNL WNL   Findings:      Lower  Extremity Right Left  Dorsalis Pedis    Anterior Tibial    Posterior Tibial    Ankle/Brachial Indices      Findings:

## 2013-07-07 NOTE — Pre-Procedure Instructions (Addendum)
Sean Brock  07/07/2013   Your procedure is scheduled on:  May 8  Report to [facility] Admitting at 05:30 AM.  Call this number if you have problems the morning of surgery: [phone]   Remember:   Do not eat food or drink liquids after midnight.   Take these medicines the morning of surgery with A SIP OF WATER: Tylenol (if needed), Amlodipine, Omeprazole,    STOP Multiple Vitamins today   STOP/ Do not take Aspirin, Aleve, Naproxen, Advil, Ibuprofen, Vitamin, Herbs, or Supplements starting today   Do not wear jewelry, make-up or nail polish.  Do not wear lotions, powders, or perfumes. You may wear deodorant.  Do not shave 48 hours prior to surgery. Men may shave face and neck.  Do not bring valuables to the hospital.  [facility] is not responsible for any belongings or valuables.               Contacts, dentures or bridgework may not be worn into surgery.  Leave suitcase in the car. After surgery it may be brought to your room.  For patients admitted to the hospital, discharge time is determined by your treatment team.               Special Instructions: See [facility] Preparing For Surgery   Please read over the following fact sheets that you were given: Pain Booklet, Coughing and Deep Breathing, Blood Transfusion Information, Open Heart Packet and Surgical Site Infection Prevention

## 2013-07-07 NOTE — Progress Notes (Signed)
[address]       [place] [address]             [phone]     CARDIOTHORACIC SURGERY OFFICE NOTE  Referring Provider is Larey Dresser, MD PCP is Simona Huh, MD   HPI:  Patient returns for followup of severe symptomatic aortic stenosis with preserved left ventricular systolic function and moderate coronary artery disease.  He was originally seen in consultation on 06/17/2013 and he returns for followup today with tentatively plans to proceed with elective aortic valve replacement and coronary artery bypass grafting later this week. Over the last 2 weeks he reports no new problems or complaints. He has had a few dizzy spells without syncope. He has not had increased symptoms of chest pain or shortness of breath. The remainder of his review of systems is unchanged from previously.   Current Outpatient Prescriptions  Medication Sig Dispense Refill  . acetaminophen (TYLENOL) 500 MG tablet Take 1,000 mg by mouth every 6 (six) hours as needed for mild pain.      Marland Kitchen amLODipine (NORVASC) 10 MG tablet Take 10 mg by mouth daily.      . Multiple Vitamin (MULTIVITAMIN WITH MINERALS) TABS tablet Take 1 tablet by mouth daily.      Marland Kitchen omeprazole (PRILOSEC) 20 MG capsule Take 20 mg by mouth daily as needed (for acid reduction).       Sean Brock Glycol-Propyl Glycol (SYSTANE OP) Place 1 drop into both eyes daily as needed (for dry eyes).       . rosuvastatin (CRESTOR) 10 MG tablet Take 10 mg by mouth daily.      . Tamsulosin HCl (FLOMAX) 0.4 MG CAPS Take 0.4 mg by mouth at bedtime.        No current facility-administered medications for this visit.      Physical Exam:   BP 102/63  Pulse 55  Resp 16  Ht 6\' 3"  (1.905 m)  Wt 190 lb (86.183 kg)  BMI 23.75 kg/m2  SpO2 98%  General:  Well-appearing  Chest:   Clear to auscultation  CV:   Regular rate and rhythm with prominent systolic murmur  Incisions:  n/a  Abdomen:  Soft and nontender  Extremities:  Warm  and well-perfused  Diagnostic Tests:   CT ANGIOGRAPHY CHEST WITH CONTRAST   TECHNIQUE: Multidetector CT imaging of the chest was performed using the standard protocol during bolus administration of intravenous contrast. Multiplanar CT image reconstructions and MIPs were obtained to evaluate the vascular anatomy.   CONTRAST:  89mL OMNIPAQUE IOHEXOL 350 MG/ML SOLN   COMPARISON:  Prior chest x-ray 06/12/2013   FINDINGS: Mediastinum: Tiny 3 mm low-attenuation nodule in the left thyroid gland is likely clinically insignificant no mediastinal mass or suspicious adenopathy. Unremarkable thoracic esophagus.   Heart/Vascular: Normal caliber aorta. There is a borderline ectasia of the ascending aortic segment with a maximal transverse diameter of 3.8 cm. The aortic root remains within normal limits at 3.4 cm in diameter. No effacement of the sino-tubular junction. Four vessel aortic arch configuration. The left vertebral artery arises directly from the arch. Atherosclerotic vascular calcifications without evidence of significant stenosis. Normal caliber main and central pulmonary arteries. No definite central pulmonary embolus. The heart is enlarged. Atherosclerotic calcifications noted in the left anterior descending, circumflex and right coronary arteries. The aortic valve is thickened and partially calcified. No pericardial effusion.   Lungs/Pleura: The lungs are clear.  No pleural effusion.   Bones/Soft  Tissues: No acute fracture or aggressive appearing lytic or blastic osseous lesion. Degenerative osteoarthritis of the bilateral glenohumeral joints noted incidentally.   Upper Abdomen: Visualized upper abdominal organs are unremarkable.   Review of the MIP images confirms the above findings.   IMPRESSION: 1. Ectasia of the ascending thoracic aorta to a maximal diameter of 3.8 cm. 2. Thickened and calcified aortic valve. If there is clinical concern for underlying aortic  valvular pathology (stenosis or incompetence, echocardiography could further evaluate. 3. Atherosclerosis including multivessel coronary artery disease. Please note that although the presence of coronary artery calcium documents the presence of coronary artery disease, the severity of this disease and any potential stenosis cannot be assessed on this non-gated CT examination. Assessment for potential risk factor modification, dietary therapy or pharmacologic therapy may be warranted, if clinically indicated. 4. Four vessel aortic arch configuration. The left vertebral artery arises directly from the aorta. Signed,   Sean Peaches, MD   [facility]   [facility]     Electronically Signed   By: Jacqulynn Cadet M.D.   On: 06/26/2013 17:21     Impression:  The patient has severe symptomatic aortic stenosis with preserved left ventricular systolic function and moderate coronary artery disease with chronic total occlusion of the right coronary artery. The patient has long-standing history of intermittent dizzy spells that may or may not be related to his aortic stenosis, but he clearly describes progressive symptoms of exertional shortness of breath and intermittent chest tightness as well as worsening fatigue, currently with symptoms consistent with functional class II or III chronic diastolic congestive heart failure and angina.  CT angiogram of the chest demonstrates mild ectasia of the ascending thoracic aorta without significant aneurysmal enlargement or calcification.    Plan:  The patient and his family were again counseled regarding the indications, risks, and potential benefits of surgery. Alternative treatment strategies been discussed. We plan to proceed with aortic valve replacement using a bioprosthetic tissue valve and coronary artery bypass grafting on Friday, 07/11/2013. All of his questions have been answered.  I  spent in excess of 15 minutes during the conduct of this office consultation and >50% of this time involved direct face-to-face encounter with the patient for counseling and/or coordination of their care.   [name]. Roxy Manns, MD 07/07/2013 3:06 PM

## 2013-07-07 NOTE — Pre-Procedure Instructions (Signed)
[facility] - Preparing for Surgery  Before surgery, you can play an important role.  Because skin is not sterile, your skin needs to be as free of germs as possible.  You can reduce the number of germs on you skin by washing with CHG (chlorahexidine gluconate) soap before surgery.  CHG is an antiseptic cleaner which kills germs and bonds with the skin to continue killing germs even after washing.  Please DO NOT use if you have an allergy to CHG or antibacterial soaps.  If your skin becomes reddened/irritated stop using the CHG and inform your nurse when you arrive at Short Stay.  Do not shave (including legs and underarms) for at least 48 hours prior to the first CHG shower.  You may shave your face.  Please follow these instructions carefully:   1.  Shower with CHG Soap the night before surgery and the morning of Surgery.  2.  If you choose to wash your hair, wash your hair first as usual with your normal shampoo.  3.  After you shampoo, rinse your hair and body thoroughly to remove the shampoo.  4.  Use CHG as you would any other liquid soap.  You can apply CHG directly to the skin and wash gently with scrungie or a clean washcloth.  5.  Apply the CHG Soap to your body ONLY FROM THE NECK DOWN.  Do not use on open wounds or open sores.  Avoid contact with your eyes, ears, mouth and genitals (private parts).  Wash genitals (private parts) with your normal soap.  6.  Wash thoroughly, paying special attention to the area where your surgery will be performed.  7.  Thoroughly rinse your body with warm water from the neck down.  8.  DO NOT shower/wash with your normal soap after using and rinsing off the CHG Soap.  9.  Pat yourself dry with a clean towel.            10.  Wear clean pajamas.            11.  Place clean sheets on your bed the night of your first shower and do not sleep with pets.  Day of Surgery  Do not apply any lotions the morning of surgery.  Please wear clean clothes to the  hospital/surgery center.   

## 2013-07-10 ENCOUNTER — Encounter: Payer: Self-pay | Admitting: Internal Medicine

## 2013-07-10 MED ORDER — SODIUM CHLORIDE 0.9 % IV SOLN
INTRAVENOUS | Status: AC
  Administered 2013-07-11: 70 mL/h via INTRAVENOUS
  Filled 2013-07-10: qty 40

## 2013-07-10 MED ORDER — PHENYLEPHRINE HCL 10 MG/ML IJ SOLN
30.0000 ug/min | INTRAVENOUS | Status: AC
  Administered 2013-07-11: 25 ug/min via INTRAVENOUS
  Filled 2013-07-10: qty 2

## 2013-07-10 MED ORDER — PAPAVERINE HCL 30 MG/ML IJ SOLN
INTRAMUSCULAR | Status: AC
  Administered 2013-07-11: 10:00:00
  Filled 2013-07-10: qty 2.5

## 2013-07-10 MED ORDER — DOPAMINE-DEXTROSE 3.2-5 MG/ML-% IV SOLN
2.0000 ug/kg/min | INTRAVENOUS | Status: DC
  Filled 2013-07-10: qty 250

## 2013-07-10 MED ORDER — DEXTROSE 5 % IV SOLN
750.0000 mg | INTRAVENOUS | Status: DC
  Filled 2013-07-10: qty 750

## 2013-07-10 MED ORDER — CHLORHEXIDINE GLUCONATE 4 % EX LIQD
30.0000 mL | CUTANEOUS | Status: DC
Start: 2013-07-10 — End: 2013-07-11
  Filled 2013-07-10: qty 30

## 2013-07-10 MED ORDER — EPINEPHRINE HCL 1 MG/ML IJ SOLN
0.5000 ug/min | INTRAVENOUS | Status: DC
  Filled 2013-07-10: qty 4

## 2013-07-10 MED ORDER — METOPROLOL TARTRATE 12.5 MG HALF TABLET: 12.5000 mg | ORAL_TABLET | Freq: Once | ORAL | Status: DC

## 2013-07-10 MED ORDER — SODIUM CHLORIDE 0.9 % IV SOLN
INTRAVENOUS | Status: DC
  Filled 2013-07-10: qty 30

## 2013-07-10 MED ORDER — VANCOMYCIN HCL 1000 MG IV SOLR
INTRAVENOUS | Status: AC
  Administered 2013-07-11: 09:00:00
  Filled 2013-07-10: qty 1000

## 2013-07-10 MED ORDER — SODIUM CHLORIDE 0.9 % IV SOLN
INTRAVENOUS | Status: AC
  Administered 2013-07-11: 2 [IU]/h via INTRAVENOUS
  Filled 2013-07-10: qty 1

## 2013-07-10 MED ORDER — CEFUROXIME SODIUM 1.5 G IJ SOLR
1.5000 g | INTRAMUSCULAR | Status: AC
  Administered 2013-07-11: 1.5 g via INTRAVENOUS
  Administered 2013-07-11: .75 g via INTRAVENOUS
  Filled 2013-07-10: qty 1.5

## 2013-07-10 MED ORDER — POTASSIUM CHLORIDE 2 MEQ/ML IV SOLN
80.0000 meq | INTRAVENOUS | Status: DC
  Filled 2013-07-10: qty 40

## 2013-07-10 MED ORDER — MAGNESIUM SULFATE 50 % IJ SOLN
40.0000 meq | INTRAMUSCULAR | Status: DC
  Filled 2013-07-10: qty 10

## 2013-07-10 MED ORDER — DEXMEDETOMIDINE HCL IN NACL 400 MCG/100ML IV SOLN
0.1000 ug/kg/h | INTRAVENOUS | Status: AC
  Administered 2013-07-11: 0.2 ug/kg/h via INTRAVENOUS
  Filled 2013-07-10: qty 100

## 2013-07-10 MED ORDER — NITROGLYCERIN IN D5W 200-5 MCG/ML-% IV SOLN
2.0000 ug/min | INTRAVENOUS | Status: AC
  Administered 2013-07-11: 5 ug/min via INTRAVENOUS
  Filled 2013-07-10: qty 250

## 2013-07-10 MED ORDER — VANCOMYCIN HCL 10 G IV SOLR
1500.0000 mg | INTRAVENOUS | Status: AC
  Administered 2013-07-11: 1500 mg via INTRAVENOUS
  Filled 2013-07-10: qty 1500

## 2013-07-11 ENCOUNTER — Encounter (HOSPITAL_COMMUNITY)
Admission: RE | Disposition: A | Discharge status: Home / Self Care | Payer: Medicare Other | Source: Ambulatory Visit | Attending: Thoracic Surgery (Cardiothoracic Vascular Surgery)

## 2013-07-11 ENCOUNTER — Inpatient Hospital Stay (HOSPITAL_COMMUNITY): Payer: Medicare Other | Admitting: Anesthesiology

## 2013-07-11 ENCOUNTER — Encounter (HOSPITAL_COMMUNITY): Payer: Self-pay | Admitting: *Deleted

## 2013-07-11 ENCOUNTER — Inpatient Hospital Stay (HOSPITAL_COMMUNITY)
Admission: RE | Admit: 2013-07-11 | Discharge: 2013-07-15 | DRG: 221 | Disposition: A | Discharge status: Home / Self Care | Payer: Medicare Other | Source: Ambulatory Visit | Attending: Thoracic Surgery (Cardiothoracic Vascular Surgery) | Admitting: Thoracic Surgery (Cardiothoracic Vascular Surgery)

## 2013-07-11 ENCOUNTER — Inpatient Hospital Stay (HOSPITAL_COMMUNITY): Payer: Medicare Other

## 2013-07-11 DIAGNOSIS — K3189 Other diseases of stomach and duodenum: Secondary | ICD-10-CM | POA: Diagnosis present

## 2013-07-11 DIAGNOSIS — Z87891 Personal history of nicotine dependence: Secondary | ICD-10-CM

## 2013-07-11 DIAGNOSIS — I10 Essential (primary) hypertension: Secondary | ICD-10-CM | POA: Diagnosis present

## 2013-07-11 DIAGNOSIS — I079 Rheumatic tricuspid valve disease, unspecified: Secondary | ICD-10-CM | POA: Diagnosis present

## 2013-07-11 DIAGNOSIS — R1013 Epigastric pain: Secondary | ICD-10-CM

## 2013-07-11 DIAGNOSIS — I359 Nonrheumatic aortic valve disorder, unspecified: Principal | ICD-10-CM

## 2013-07-11 DIAGNOSIS — I350 Nonrheumatic aortic (valve) stenosis: Secondary | ICD-10-CM | POA: Diagnosis present

## 2013-07-11 DIAGNOSIS — I2582 Chronic total occlusion of coronary artery: Secondary | ICD-10-CM | POA: Diagnosis present

## 2013-07-11 DIAGNOSIS — K219 Gastro-esophageal reflux disease without esophagitis: Secondary | ICD-10-CM | POA: Diagnosis present

## 2013-07-11 DIAGNOSIS — I498 Other specified cardiac arrhythmias: Secondary | ICD-10-CM | POA: Diagnosis present

## 2013-07-11 DIAGNOSIS — Z801 Family history of malignant neoplasm of trachea, bronchus and lung: Secondary | ICD-10-CM

## 2013-07-11 DIAGNOSIS — M1990 Unspecified osteoarthritis, unspecified site: Secondary | ICD-10-CM | POA: Diagnosis present

## 2013-07-11 DIAGNOSIS — G589 Mononeuropathy, unspecified: Secondary | ICD-10-CM | POA: Diagnosis present

## 2013-07-11 DIAGNOSIS — E785 Hyperlipidemia, unspecified: Secondary | ICD-10-CM | POA: Diagnosis present

## 2013-07-11 DIAGNOSIS — I700 Atherosclerosis of aorta: Secondary | ICD-10-CM | POA: Diagnosis present

## 2013-07-11 DIAGNOSIS — E119 Type 2 diabetes mellitus without complications: Secondary | ICD-10-CM | POA: Diagnosis present

## 2013-07-11 DIAGNOSIS — Z8673 Personal history of transient ischemic attack (TIA), and cerebral infarction without residual deficits: Secondary | ICD-10-CM

## 2013-07-11 DIAGNOSIS — I2510 Atherosclerotic heart disease of native coronary artery without angina pectoris: Secondary | ICD-10-CM

## 2013-07-11 DIAGNOSIS — E7800 Pure hypercholesterolemia, unspecified: Secondary | ICD-10-CM | POA: Diagnosis present

## 2013-07-11 DIAGNOSIS — Z833 Family history of diabetes mellitus: Secondary | ICD-10-CM

## 2013-07-11 DIAGNOSIS — D696 Thrombocytopenia, unspecified: Secondary | ICD-10-CM | POA: Diagnosis not present

## 2013-07-11 DIAGNOSIS — Z953 Presence of xenogenic heart valve: Secondary | ICD-10-CM

## 2013-07-11 DIAGNOSIS — Z951 Presence of aortocoronary bypass graft: Secondary | ICD-10-CM

## 2013-07-11 DIAGNOSIS — R002 Palpitations: Secondary | ICD-10-CM | POA: Diagnosis present

## 2013-07-11 HISTORY — PX: AORTIC VALVE REPLACEMENT: SHX41

## 2013-07-11 HISTORY — DX: Presence of aortocoronary bypass graft: Z95.1

## 2013-07-11 HISTORY — DX: Presence of xenogenic heart valve: Z95.3

## 2013-07-11 HISTORY — PX: INTRAOPERATIVE TRANSESOPHAGEAL ECHOCARDIOGRAM: SHX5062

## 2013-07-11 HISTORY — PX: CORONARY ARTERY BYPASS GRAFT: SHX141

## 2013-07-11 LAB — PLATELET COUNT: Platelets: 166 10*3/uL (ref 150–400)

## 2013-07-11 LAB — CBC
HCT: 38.2 % — ABNORMAL LOW (ref 39.0–52.0)
HEMATOCRIT: 41.5 % (ref 39.0–52.0)
HEMOGLOBIN: 13.7 g/dL (ref 13.0–17.0)
HEMOGLOBIN: 13.1 g/dL (ref 13.0–17.0)
MCH: 29.3 pg (ref 26.0–34.0)
MCH: 29.8 pg (ref 26.0–34.0)
MCHC: 33 g/dL (ref 30.0–36.0)
MCHC: 34.3 g/dL (ref 30.0–36.0)
MCV: 88.9 fL (ref 78.0–100.0)
MCV: 87 fL (ref 78.0–100.0)
Platelets: 125 10*3/uL — ABNORMAL LOW (ref 150–400)
Platelets: 126 10*3/uL — ABNORMAL LOW (ref 150–400)
RBC: 4.67 MIL/uL (ref 4.22–5.81)
RBC: 4.39 MIL/uL (ref 4.22–5.81)
RDW: 13.1 % (ref 11.5–15.5)
RDW: 12.9 % (ref 11.5–15.5)
WBC: 15.9 10*3/uL — AB (ref 4.0–10.5)
WBC: 14.1 10*3/uL — AB (ref 4.0–10.5)

## 2013-07-11 LAB — POCT I-STAT 4, (NA,K, GLUC, HGB,HCT)
GLUCOSE: 125 mg/dL — AB (ref 70–99)
GLUCOSE: 170 mg/dL — AB (ref 70–99)
GLUCOSE: 219 mg/dL — AB (ref 70–99)
GLUCOSE: 147 mg/dL — AB (ref 70–99)
Glucose, Bld: 119 mg/dL — ABNORMAL HIGH (ref 70–99)
Glucose, Bld: 185 mg/dL — ABNORMAL HIGH (ref 70–99)
HCT: 35 % — ABNORMAL LOW (ref 39.0–52.0)
HCT: 27 % — ABNORMAL LOW (ref 39.0–52.0)
HCT: 28 % — ABNORMAL LOW (ref 39.0–52.0)
HCT: 37 % — ABNORMAL LOW (ref 39.0–52.0)
HEMATOCRIT: 37 % — AB (ref 39.0–52.0)
HEMATOCRIT: 26 % — AB (ref 39.0–52.0)
HEMOGLOBIN: 12.6 g/dL — AB (ref 13.0–17.0)
HEMOGLOBIN: 8.8 g/dL — AB (ref 13.0–17.0)
HEMOGLOBIN: 9.2 g/dL — AB (ref 13.0–17.0)
Hemoglobin: 11.9 g/dL — ABNORMAL LOW (ref 13.0–17.0)
Hemoglobin: 9.5 g/dL — ABNORMAL LOW (ref 13.0–17.0)
Hemoglobin: 12.6 g/dL — ABNORMAL LOW (ref 13.0–17.0)
POTASSIUM: 3.4 meq/L — AB (ref 3.7–5.3)
POTASSIUM: 3.5 meq/L — AB (ref 3.7–5.3)
Potassium: 3.5 mEq/L — ABNORMAL LOW (ref 3.7–5.3)
Potassium: 3.5 mEq/L — ABNORMAL LOW (ref 3.7–5.3)
Potassium: 3.4 mEq/L — ABNORMAL LOW (ref 3.7–5.3)
Potassium: 3.4 mEq/L — ABNORMAL LOW (ref 3.7–5.3)
SODIUM: 143 meq/L (ref 137–147)
SODIUM: 140 meq/L (ref 137–147)
Sodium: 136 mEq/L — ABNORMAL LOW (ref 137–147)
Sodium: 142 mEq/L (ref 137–147)
Sodium: 140 mEq/L (ref 137–147)
Sodium: 135 mEq/L — ABNORMAL LOW (ref 137–147)

## 2013-07-11 LAB — GLUCOSE, CAPILLARY
GLUCOSE-CAPILLARY: 96 mg/dL (ref 70–99)
GLUCOSE-CAPILLARY: 87 mg/dL (ref 70–99)
Glucose-Capillary: 117 mg/dL — ABNORMAL HIGH (ref 70–99)
Glucose-Capillary: 88 mg/dL (ref 70–99)
Glucose-Capillary: 118 mg/dL — ABNORMAL HIGH (ref 70–99)
Glucose-Capillary: 135 mg/dL — ABNORMAL HIGH (ref 70–99)

## 2013-07-11 LAB — POCT I-STAT 3, ART BLOOD GAS (G3+)
ACID-BASE DEFICIT: 3 mmol/L — AB (ref 0.0–2.0)
ACID-BASE DEFICIT: 4 mmol/L — AB (ref 0.0–2.0)
Acid-base deficit: 4 mmol/L — ABNORMAL HIGH (ref 0.0–2.0)
BICARBONATE: 21.2 meq/L (ref 20.0–24.0)
Bicarbonate: 24.9 mEq/L — ABNORMAL HIGH (ref 20.0–24.0)
Bicarbonate: 23 mEq/L (ref 20.0–24.0)
Bicarbonate: 21.5 mEq/L (ref 20.0–24.0)
O2 SAT: 98 %
O2 SAT: 98 %
O2 Saturation: 100 %
O2 Saturation: 99 %
PCO2 ART: 38.3 mmHg (ref 35.0–45.0)
PH ART: 7.421 (ref 7.350–7.450)
PO2 ART: 319 mmHg — AB (ref 80.0–100.0)
PO2 ART: 118 mmHg — AB (ref 80.0–100.0)
TCO2: 26 mmol/L (ref 0–100)
TCO2: 24 mmol/L (ref 0–100)
TCO2: 22 mmol/L (ref 0–100)
TCO2: 23 mmol/L (ref 0–100)
pCO2 arterial: 44.1 mmHg (ref 35.0–45.0)
pCO2 arterial: 38.1 mmHg (ref 35.0–45.0)
pCO2 arterial: 38 mmHg (ref 35.0–45.0)
pH, Arterial: 7.325 — ABNORMAL LOW (ref 7.350–7.450)
pH, Arterial: 7.353 (ref 7.350–7.450)
pH, Arterial: 7.358 (ref 7.350–7.450)
pO2, Arterial: 122 mmHg — ABNORMAL HIGH (ref 80.0–100.0)
pO2, Arterial: 105 mmHg — ABNORMAL HIGH (ref 80.0–100.0)

## 2013-07-11 LAB — MAGNESIUM: Magnesium: 2.6 mg/dL — ABNORMAL HIGH (ref 1.5–2.5)

## 2013-07-11 LAB — PROTIME-INR
INR: 1.4 (ref 0.00–1.49)
PROTHROMBIN TIME: 16.8 s — AB (ref 11.6–15.2)

## 2013-07-11 LAB — CREATININE, SERUM
Creatinine, Ser: 0.57 mg/dL (ref 0.50–1.35)
GFR calc Af Amer: >90 mL/min (ref >90)
GFR calc non Af Amer: >90 mL/min (ref >90)

## 2013-07-11 LAB — HEMOGLOBIN AND HEMATOCRIT, BLOOD
HCT: 27.8 % — ABNORMAL LOW (ref 39.0–52.0)
Hemoglobin: 9.4 g/dL — ABNORMAL LOW (ref 13.0–17.0)

## 2013-07-11 LAB — POCT I-STAT, CHEM 8
BUN: 11 mg/dL (ref 6–23)
Calcium, Ion: 1.02 mmol/L — ABNORMAL LOW (ref 1.13–1.30)
Chloride: 114 mEq/L — ABNORMAL HIGH (ref 96–112)
Creatinine, Ser: 0.6 mg/dL (ref 0.50–1.35)
GLUCOSE: 138 mg/dL — AB (ref 70–99)
HEMATOCRIT: 37 % — AB (ref 39.0–52.0)
HEMOGLOBIN: 12.6 g/dL — AB (ref 13.0–17.0)
POTASSIUM: 3.6 meq/L — AB (ref 3.7–5.3)
SODIUM: 136 meq/L — AB (ref 137–147)
TCO2: 20 mmol/L (ref 0–100)

## 2013-07-11 LAB — APTT: aPTT: 37 seconds (ref 24–37)

## 2013-07-11 SURGERY — REPLACEMENT, AORTIC VALVE, OPEN
Anesthesia: General | Site: Chest

## 2013-07-11 MED ORDER — DEXTROSE 5 % IV SOLN
1.5000 g | Freq: Two times a day (BID) | INTRAVENOUS | Status: AC
  Administered 2013-07-11 – 2013-07-13 (×4): 1.5 g via INTRAVENOUS
  Filled 2013-07-11 (×4): qty 1.5

## 2013-07-11 MED ORDER — FENTANYL CITRATE 0.05 MG/ML IJ SOLN
INTRAMUSCULAR | Status: AC
  Filled 2013-07-11: qty 5

## 2013-07-11 MED ORDER — PHENYLEPHRINE HCL 10 MG/ML IJ SOLN
0.0000 ug/min | INTRAMUSCULAR | Status: DC
Start: 2013-07-11 — End: 2013-07-13
  Administered 2013-07-11: 25 ug/min via INTRAVENOUS
  Filled 2013-07-11 (×2): qty 2

## 2013-07-11 MED ORDER — SODIUM CHLORIDE 0.9 % IJ SOLN
3.0000 mL | Freq: Two times a day (BID) | INTRAMUSCULAR | Status: DC
  Administered 2013-07-12 – 2013-07-13 (×3): 3 mL via INTRAVENOUS

## 2013-07-11 MED ORDER — POTASSIUM CHLORIDE 10 MEQ/50ML IV SOLN
10.0000 meq | INTRAVENOUS | Status: AC
  Administered 2013-07-11 (×3): 10 meq via INTRAVENOUS

## 2013-07-11 MED ORDER — ACETAMINOPHEN 650 MG RE SUPP
650.0000 mg | Freq: Once | RECTAL | Status: AC
  Administered 2013-07-11: 650 mg via RECTAL

## 2013-07-11 MED ORDER — MORPHINE SULFATE 2 MG/ML IJ SOLN: 1.0000 mg | INTRAMUSCULAR | Status: AC | PRN

## 2013-07-11 MED ORDER — STERILE WATER FOR INJECTION IJ SOLN
INTRAMUSCULAR | Status: AC
  Filled 2013-07-11: qty 10

## 2013-07-11 MED ORDER — METOPROLOL TARTRATE 12.5 MG HALF TABLET
12.5000 mg | ORAL_TABLET | Freq: Two times a day (BID) | ORAL | Status: DC
  Administered 2013-07-12 (×2): 12.5 mg via ORAL
  Filled 2013-07-11 (×5): qty 1

## 2013-07-11 MED ORDER — FENTANYL CITRATE 0.05 MG/ML IJ SOLN
INTRAMUSCULAR | Status: DC | PRN
Start: 2013-07-11 — End: 2013-07-11
  Administered 2013-07-11: 150 ug via INTRAVENOUS
  Administered 2013-07-11: 350 ug via INTRAVENOUS
  Administered 2013-07-11 (×2): 100 ug via INTRAVENOUS
  Administered 2013-07-11: 150 ug via INTRAVENOUS
  Administered 2013-07-11: 250 ug via INTRAVENOUS
  Administered 2013-07-11: 400 ug via INTRAVENOUS

## 2013-07-11 MED ORDER — OXYCODONE HCL 5 MG PO TABS
5.0000 mg | ORAL_TABLET | ORAL | Status: DC | PRN
  Administered 2013-07-12 (×2): 5 mg via ORAL
  Administered 2013-07-12 – 2013-07-13 (×4): 10 mg via ORAL
  Filled 2013-07-11: qty 2
  Filled 2013-07-11 (×2): qty 1
  Filled 2013-07-11 (×3): qty 2

## 2013-07-11 MED ORDER — MAGNESIUM SULFATE 4000MG/100ML IJ SOLN
4.0000 g | Freq: Once | INTRAMUSCULAR | Status: AC
  Administered 2013-07-11: 4 g via INTRAVENOUS
  Filled 2013-07-11: qty 100

## 2013-07-11 MED ORDER — DOCUSATE SODIUM 100 MG PO CAPS
200.0000 mg | ORAL_CAPSULE | Freq: Every day | ORAL | Status: DC
  Administered 2013-07-12: 200 mg via ORAL
  Filled 2013-07-11: qty 2

## 2013-07-11 MED ORDER — LACTATED RINGERS IV SOLN: 500.0000 mL | Freq: Once | INTRAVENOUS | Status: AC | PRN

## 2013-07-11 MED ORDER — MIDAZOLAM HCL 10 MG/2ML IJ SOLN
INTRAMUSCULAR | Status: AC
Start: 2013-07-11 — End: 2013-07-11
  Filled 2013-07-11: qty 2

## 2013-07-11 MED ORDER — VANCOMYCIN HCL IN DEXTROSE 1-5 GM/200ML-% IV SOLN
1000.0000 mg | Freq: Once | INTRAVENOUS | Status: AC
  Administered 2013-07-11: 1000 mg via INTRAVENOUS
  Filled 2013-07-11: qty 200

## 2013-07-11 MED ORDER — ROCURONIUM BROMIDE 100 MG/10ML IV SOLN
INTRAVENOUS | Status: DC | PRN
  Administered 2013-07-11: 50 mg via INTRAVENOUS

## 2013-07-11 MED ORDER — SODIUM CHLORIDE 0.9 % IV SOLN: INTRAVENOUS | Status: DC

## 2013-07-11 MED ORDER — ARTIFICIAL TEARS OP OINT
TOPICAL_OINTMENT | OPHTHALMIC | Status: AC
  Filled 2013-07-11: qty 3.5

## 2013-07-11 MED ORDER — SODIUM CHLORIDE 0.9 % IV SOLN
250.0000 mL | INTRAVENOUS | Status: AC
  Administered 2013-07-11: 100 mL/h via INTRAVENOUS

## 2013-07-11 MED ORDER — DEXMEDETOMIDINE HCL IN NACL 200 MCG/50ML IV SOLN: 0.1000 ug/kg/h | INTRAVENOUS | Status: DC

## 2013-07-11 MED ORDER — GELATIN ABSORBABLE MT POWD
OROMUCOSAL | Status: DC | PRN
  Administered 2013-07-11 (×6): via TOPICAL

## 2013-07-11 MED ORDER — ASPIRIN 81 MG PO CHEW: 324.0000 mg | CHEWABLE_TABLET | Freq: Every day | ORAL | Status: DC

## 2013-07-11 MED ORDER — VECURONIUM BROMIDE 10 MG IV SOLR
INTRAVENOUS | Status: AC
  Filled 2013-07-11: qty 20

## 2013-07-11 MED ORDER — INSULIN ASPART 100 UNIT/ML ~~LOC~~ SOLN
0.0000 [IU] | SUBCUTANEOUS | Status: DC
  Administered 2013-07-11 – 2013-07-12 (×4): 2 [IU] via SUBCUTANEOUS

## 2013-07-11 MED ORDER — LACTATED RINGERS IV SOLN
INTRAVENOUS | Status: DC | PRN
  Administered 2013-07-11: 07:00:00 via INTRAVENOUS

## 2013-07-11 MED ORDER — SODIUM CHLORIDE 0.9 % IV SOLN
INTRAVENOUS | Status: DC | PRN
  Administered 2013-07-11: 08:00:00 via INTRAVENOUS

## 2013-07-11 MED ORDER — ROCURONIUM BROMIDE 50 MG/5ML IV SOLN
INTRAVENOUS | Status: AC
  Filled 2013-07-11: qty 4

## 2013-07-11 MED ORDER — VECURONIUM BROMIDE 10 MG IV SOLR
INTRAVENOUS | Status: AC
  Filled 2013-07-11: qty 10

## 2013-07-11 MED ORDER — ACETAMINOPHEN 500 MG PO TABS
1000.0000 mg | ORAL_TABLET | Freq: Four times a day (QID) | ORAL | Status: DC
  Administered 2013-07-11 – 2013-07-13 (×7): 1000 mg via ORAL
  Filled 2013-07-11 (×11): qty 2

## 2013-07-11 MED ORDER — BISACODYL 5 MG PO TBEC
10.0000 mg | DELAYED_RELEASE_TABLET | Freq: Every day | ORAL | Status: DC
  Administered 2013-07-12: 10 mg via ORAL
  Filled 2013-07-11: qty 2

## 2013-07-11 MED ORDER — SODIUM CHLORIDE 0.9 % IJ SOLN: 3.0000 mL | INTRAMUSCULAR | Status: DC | PRN

## 2013-07-11 MED ORDER — METOPROLOL TARTRATE 25 MG/10 ML ORAL SUSPENSION
12.5000 mg | Freq: Two times a day (BID) | ORAL | Status: DC
  Filled 2013-07-11 (×5): qty 5

## 2013-07-11 MED ORDER — MIDAZOLAM HCL 5 MG/5ML IJ SOLN
INTRAMUSCULAR | Status: DC | PRN
  Administered 2013-07-11: 3 mg via INTRAVENOUS
  Administered 2013-07-11 (×2): 2 mg via INTRAVENOUS
  Administered 2013-07-11: 4 mg via INTRAVENOUS
  Administered 2013-07-11: 2 mg via INTRAVENOUS
  Administered 2013-07-11: 3 mg via INTRAVENOUS

## 2013-07-11 MED ORDER — DEXTROSE 5 % IV SOLN
INTRAVENOUS | Status: DC | PRN
  Administered 2013-07-11: 08:00:00 via INTRAVENOUS

## 2013-07-11 MED ORDER — ALBUMIN HUMAN 5 % IV SOLN
250.0000 mL | INTRAVENOUS | Status: AC | PRN
  Administered 2013-07-11 (×3): 250 mL via INTRAVENOUS
  Filled 2013-07-11: qty 250

## 2013-07-11 MED ORDER — SODIUM CHLORIDE 0.9 % IR SOLN
Status: DC | PRN
  Administered 2013-07-11: 3000 mL

## 2013-07-11 MED ORDER — ARTIFICIAL TEARS OP OINT
TOPICAL_OINTMENT | OPHTHALMIC | Status: DC | PRN
  Administered 2013-07-11: 1 via OPHTHALMIC

## 2013-07-11 MED ORDER — NITROGLYCERIN IN D5W 200-5 MCG/ML-% IV SOLN: 0.0000 ug/min | INTRAVENOUS | Status: DC

## 2013-07-11 MED ORDER — FAMOTIDINE IN NACL 20-0.9 MG/50ML-% IV SOLN
20.0000 mg | Freq: Two times a day (BID) | INTRAVENOUS | Status: AC
  Administered 2013-07-11 – 2013-07-12 (×2): 20 mg via INTRAVENOUS
  Filled 2013-07-11: qty 50

## 2013-07-11 MED ORDER — INSULIN REGULAR BOLUS VIA INFUSION
0.0000 [IU] | Freq: Three times a day (TID) | INTRAVENOUS | Status: DC
  Filled 2013-07-11: qty 10

## 2013-07-11 MED ORDER — PROPOFOL 10 MG/ML IV BOLUS
INTRAVENOUS | Status: DC | PRN
  Administered 2013-07-11: 10 mg via INTRAVENOUS

## 2013-07-11 MED ORDER — ONDANSETRON HCL 4 MG/2ML IJ SOLN
4.0000 mg | Freq: Four times a day (QID) | INTRAMUSCULAR | Status: DC | PRN
  Administered 2013-07-12: 4 mg via INTRAVENOUS
  Filled 2013-07-11: qty 2

## 2013-07-11 MED ORDER — LACTATED RINGERS IV SOLN: INTRAVENOUS | Status: DC

## 2013-07-11 MED ORDER — PANTOPRAZOLE SODIUM 40 MG PO TBEC
40.0000 mg | DELAYED_RELEASE_TABLET | Freq: Every day | ORAL | Status: DC
  Administered 2013-07-12 – 2013-07-13 (×2): 40 mg via ORAL
  Filled 2013-07-11 (×2): qty 1

## 2013-07-11 MED ORDER — ASPIRIN EC 325 MG PO TBEC
325.0000 mg | DELAYED_RELEASE_TABLET | Freq: Every day | ORAL | Status: DC
  Administered 2013-07-12 – 2013-07-13 (×2): 325 mg via ORAL
  Filled 2013-07-11 (×2): qty 1

## 2013-07-11 MED ORDER — BISACODYL 10 MG RE SUPP: 10.0000 mg | Freq: Every day | RECTAL | Status: DC

## 2013-07-11 MED ORDER — SODIUM CHLORIDE 0.9 % IV SOLN
INTRAVENOUS | Status: DC
  Filled 2013-07-11: qty 1

## 2013-07-11 MED ORDER — HEPARIN SODIUM (PORCINE) 1000 UNIT/ML IJ SOLN
INTRAMUSCULAR | Status: DC | PRN
  Administered 2013-07-11: 25000 [IU] via INTRAVENOUS

## 2013-07-11 MED ORDER — ROCURONIUM BROMIDE 50 MG/5ML IV SOLN
INTRAVENOUS | Status: AC
  Filled 2013-07-11: qty 2

## 2013-07-11 MED ORDER — LIDOCAINE HCL (CARDIAC) 20 MG/ML IV SOLN
INTRAVENOUS | Status: DC | PRN
  Administered 2013-07-11: 20 mg via INTRAVENOUS

## 2013-07-11 MED ORDER — MORPHINE SULFATE 2 MG/ML IJ SOLN
2.0000 mg | INTRAMUSCULAR | Status: DC | PRN
  Administered 2013-07-11: 4 mg via INTRAVENOUS
  Administered 2013-07-11 – 2013-07-12 (×3): 2 mg via INTRAVENOUS
  Administered 2013-07-12 (×2): 4 mg via INTRAVENOUS
  Administered 2013-07-12: 2 mg via INTRAVENOUS
  Filled 2013-07-11: qty 1
  Filled 2013-07-11: qty 2
  Filled 2013-07-11: qty 1
  Filled 2013-07-11: qty 2
  Filled 2013-07-11 (×2): qty 1
  Filled 2013-07-11: qty 2

## 2013-07-11 MED ORDER — ACETAMINOPHEN 160 MG/5ML PO SOLN: 650.0000 mg | Freq: Once | ORAL | Status: AC

## 2013-07-11 MED ORDER — PROTAMINE SULFATE 10 MG/ML IV SOLN
INTRAVENOUS | Status: AC
  Filled 2013-07-11: qty 25

## 2013-07-11 MED ORDER — SODIUM CHLORIDE 0.9 % IJ SOLN
INTRAMUSCULAR | Status: AC
  Filled 2013-07-11: qty 10

## 2013-07-11 MED ORDER — METOPROLOL TARTRATE 1 MG/ML IV SOLN: 2.5000 mg | INTRAVENOUS | Status: DC | PRN

## 2013-07-11 MED ORDER — MIDAZOLAM HCL 10 MG/2ML IJ SOLN
INTRAMUSCULAR | Status: AC
  Filled 2013-07-11: qty 2

## 2013-07-11 MED ORDER — VECURONIUM BROMIDE 10 MG IV SOLR
INTRAVENOUS | Status: DC | PRN
  Administered 2013-07-11 (×3): 10 mg via INTRAVENOUS

## 2013-07-11 MED ORDER — PROPOFOL 10 MG/ML IV BOLUS
INTRAVENOUS | Status: AC
  Filled 2013-07-11: qty 20

## 2013-07-11 MED ORDER — ACETAMINOPHEN 160 MG/5ML PO SOLN
1000.0000 mg | Freq: Four times a day (QID) | ORAL | Status: DC
  Filled 2013-07-11 (×2): qty 40

## 2013-07-11 MED ORDER — SODIUM CHLORIDE 0.45 % IV SOLN
INTRAVENOUS | Status: DC
  Administered 2013-07-11: 20 mL/h via INTRAVENOUS

## 2013-07-11 MED ORDER — MIDAZOLAM HCL 2 MG/2ML IJ SOLN: 2.0000 mg | INTRAMUSCULAR | Status: DC | PRN

## 2013-07-11 MED ORDER — PROTAMINE SULFATE 10 MG/ML IV SOLN
INTRAVENOUS | Status: DC | PRN
  Administered 2013-07-11: 200 mg via INTRAVENOUS

## 2013-07-11 MED FILL — Electrolyte-R (PH 7.4) Solution: INTRAVENOUS | Qty: 4000 | Status: AC

## 2013-07-11 MED FILL — Sodium Bicarbonate IV Soln 8.4%: INTRAVENOUS | Qty: 50 | Status: AC

## 2013-07-11 MED FILL — Heparin Sodium (Porcine) Inj 1000 Unit/ML: INTRAMUSCULAR | Qty: 60 | Status: AC

## 2013-07-11 MED FILL — Sodium Chloride IV Soln 0.9%: INTRAVENOUS | Qty: 2000 | Status: AC

## 2013-07-11 MED FILL — Lidocaine HCl IV Inj 20 MG/ML: INTRAVENOUS | Qty: 5 | Status: AC

## 2013-07-11 MED FILL — Mannitol IV Soln 20%: INTRAVENOUS | Qty: 500 | Status: AC

## 2013-07-11 SURGICAL SUPPLY — 143 items
ADAPTER CARDIO PERF ANTE/RETRO (ADAPTER) ×3 IMPLANT
APPLIER CLIP 9.375 MED OPEN (MISCELLANEOUS)
APPLIER CLIP 9.375 SM OPEN (CLIP)
ATTRACTOMAT 16X20 MAGNETIC DRP (DRAPES) ×3 IMPLANT
BAG DECANTER FOR FLEXI CONT (MISCELLANEOUS) ×3 IMPLANT
BANDAGE ELASTIC 4 VELCRO ST LF (GAUZE/BANDAGES/DRESSINGS) ×3 IMPLANT
BANDAGE ELASTIC 6 VELCRO ST LF (GAUZE/BANDAGES/DRESSINGS) ×3 IMPLANT
BANDAGE GAUZE ELAST BULKY 4 IN (GAUZE/BANDAGES/DRESSINGS) ×3 IMPLANT
BASKET HEART (ORDER IN 25'S) (MISCELLANEOUS) ×1
BASKET HEART (ORDER IN 25S) (MISCELLANEOUS) ×2 IMPLANT
BENZOIN TINCTURE PRP APPL 2/3 (GAUZE/BANDAGES/DRESSINGS) ×3 IMPLANT
BLADE NEEDLE 3 SS STRL (BLADE) ×3 IMPLANT
BLADE STERNUM SYSTEM 6 (BLADE) ×3 IMPLANT
BLADE SURG 11 STRL SS (BLADE) ×3 IMPLANT
BLADE SURG ROTATE 9660 (MISCELLANEOUS) IMPLANT
CANISTER SUCTION 2500CC (MISCELLANEOUS) ×3 IMPLANT
CANNULA EZ GLIDE AORTIC 21FR (CANNULA) ×3 IMPLANT
CANNULA GUNDRY RCSP 15FR (MISCELLANEOUS) ×3 IMPLANT
CANNULA SOFTFLOW AORTIC 7M21FR (CANNULA) ×3 IMPLANT
CANNULA VENOUS LOW PROF 34X46 (CANNULA) ×3 IMPLANT
CARDIAC SUCTION (MISCELLANEOUS) ×3 IMPLANT
CATH CPB KIT OWEN (MISCELLANEOUS) ×3 IMPLANT
CATH HEART VENT LEFT (CATHETERS) ×2 IMPLANT
CATH THORACIC 28FR (CATHETERS) IMPLANT
CATH THORACIC 28FR RT ANG (CATHETERS) IMPLANT
CATH THORACIC 36FR (CATHETERS) ×3 IMPLANT
CATH THORACIC 36FR RT ANG (CATHETERS) ×3 IMPLANT
CLIP APPLIE 9.375 MED OPEN (MISCELLANEOUS) IMPLANT
CLIP APPLIE 9.375 SM OPEN (CLIP) IMPLANT
CLIP FOGARTY SPRING 6M (CLIP) IMPLANT
CLIP TI MEDIUM 24 (CLIP) IMPLANT
CLIP TI WIDE RED SMALL 24 (CLIP) IMPLANT
CONN Y 3/8X3/8X3/8  BEN (MISCELLANEOUS)
CONN Y 3/8X3/8X3/8 BEN (MISCELLANEOUS) IMPLANT
CONT SPEC 4OZ CLIKSEAL STRL BL (MISCELLANEOUS) ×3 IMPLANT
COVER SURGICAL LIGHT HANDLE (MISCELLANEOUS) ×3 IMPLANT
CRADLE DONUT ADULT HEAD (MISCELLANEOUS) ×3 IMPLANT
DERMABOND ADVANCED (GAUZE/BANDAGES/DRESSINGS) ×1
DERMABOND ADVANCED .7 DNX12 (GAUZE/BANDAGES/DRESSINGS) ×2 IMPLANT
DEVICE TROCAR PUNCTURE CLOSURE (ENDOMECHANICALS) ×3 IMPLANT
DRAIN CHANNEL 32F RND 10.7 FF (WOUND CARE) ×3 IMPLANT
DRAPE BILATERAL SPLIT (DRAPES) IMPLANT
DRAPE CARDIOVASCULAR INCISE (DRAPES) ×1
DRAPE CV SPLIT W-CLR ANES SCRN (DRAPES) IMPLANT
DRAPE INCISE IOBAN 66X45 STRL (DRAPES) ×3 IMPLANT
DRAPE SLUSH/WARMER DISC (DRAPES) ×3 IMPLANT
DRAPE SRG 135X102X78XABS (DRAPES) ×2 IMPLANT
DRSG COVADERM 4X14 (GAUZE/BANDAGES/DRESSINGS) ×3 IMPLANT
ELECT REM PT RETURN 9FT ADLT (ELECTROSURGICAL) ×6
ELECTRODE REM PT RTRN 9FT ADLT (ELECTROSURGICAL) ×4 IMPLANT
GLOVE BIO SURGEON STRL SZ 6 (GLOVE) IMPLANT
GLOVE BIO SURGEON STRL SZ 6.5 (GLOVE) IMPLANT
GLOVE BIO SURGEON STRL SZ7 (GLOVE) ×3 IMPLANT
GLOVE BIO SURGEON STRL SZ7.5 (GLOVE) IMPLANT
GLOVE BIOGEL PI IND STRL 6 (GLOVE) IMPLANT
GLOVE BIOGEL PI IND STRL 6.5 (GLOVE) IMPLANT
GLOVE BIOGEL PI IND STRL 7.0 (GLOVE) ×10 IMPLANT
GLOVE BIOGEL PI INDICATOR 6 (GLOVE)
GLOVE BIOGEL PI INDICATOR 6.5 (GLOVE)
GLOVE BIOGEL PI INDICATOR 7.0 (GLOVE) ×5
GLOVE EUDERMIC 7 POWDERFREE (GLOVE) IMPLANT
GLOVE ORTHO TXT STRL SZ7.5 (GLOVE) ×9 IMPLANT
GOWN STRL REUS W/ TWL LRG LVL3 (GOWN DISPOSABLE) ×8 IMPLANT
GOWN STRL REUS W/TWL LRG LVL3 (GOWN DISPOSABLE) ×4
HEMOSTAT POWDER SURGIFOAM 1G (HEMOSTASIS) ×9 IMPLANT
INSERT FOGARTY 61MM (MISCELLANEOUS) IMPLANT
INSERT FOGARTY XLG (MISCELLANEOUS) ×3 IMPLANT
KIT BASIN OR (CUSTOM PROCEDURE TRAY) ×3 IMPLANT
KIT ROOM TURNOVER OR (KITS) ×3 IMPLANT
KIT SUCTION CATH 14FR (SUCTIONS) ×15 IMPLANT
KIT VASOVIEW W/TROCAR VH 2000 (KITS) ×3 IMPLANT
LEAD PACING MYOCARDI (MISCELLANEOUS) ×3 IMPLANT
LINE VENT (MISCELLANEOUS) ×3 IMPLANT
MARKER GRAFT CORONARY BYPASS (MISCELLANEOUS) ×9 IMPLANT
NS IRRIG 1000ML POUR BTL (IV SOLUTION) ×15 IMPLANT
PACK OPEN HEART (CUSTOM PROCEDURE TRAY) ×3 IMPLANT
PAD ARMBOARD 7.5X6 YLW CONV (MISCELLANEOUS) ×6 IMPLANT
PAD ELECT DEFIB RADIOL ZOLL (MISCELLANEOUS) ×3 IMPLANT
PENCIL BUTTON HOLSTER BLD 10FT (ELECTRODE) ×3 IMPLANT
PUNCH AORTIC ROT 4.0MM RCL 40 (MISCELLANEOUS) ×3 IMPLANT
PUNCH AORTIC ROTATE 4.0MM (MISCELLANEOUS) IMPLANT
PUNCH AORTIC ROTATE 4.5MM 8IN (MISCELLANEOUS) IMPLANT
PUNCH AORTIC ROTATE 5MM 8IN (MISCELLANEOUS) IMPLANT
SET CARDIOPLEGIA MPS 5001102 (MISCELLANEOUS) ×3 IMPLANT
SET IRRIG TUBING LAPAROSCOPIC (IRRIGATION / IRRIGATOR) ×3 IMPLANT
SOLUTION ANTI FOG 6CC (MISCELLANEOUS) ×3 IMPLANT
SPONGE GAUZE 4X4 12PLY (GAUZE/BANDAGES/DRESSINGS) ×6 IMPLANT
SPONGE LAP 18X18 X RAY DECT (DISPOSABLE) IMPLANT
SPONGE LAP 4X18 X RAY DECT (DISPOSABLE) IMPLANT
SUT BONE WAX W31G (SUTURE) ×3 IMPLANT
SUT ETHIBON 2 0 V 52N 30 (SUTURE) ×6 IMPLANT
SUT ETHIBON EXCEL 2-0 V-5 (SUTURE) IMPLANT
SUT ETHIBOND 2 0 SH (SUTURE)
SUT ETHIBOND 2 0 SH 36X2 (SUTURE) IMPLANT
SUT ETHIBOND 2 0 V4 (SUTURE) IMPLANT
SUT ETHIBOND 2 0V4 GREEN (SUTURE) IMPLANT
SUT ETHIBOND 4 0 RB 1 (SUTURE) IMPLANT
SUT ETHIBOND V-5 VALVE (SUTURE) IMPLANT
SUT ETHIBOND X763 2 0 SH 1 (SUTURE) ×9 IMPLANT
SUT MNCRL AB 3-0 PS2 18 (SUTURE) ×6 IMPLANT
SUT MNCRL AB 4-0 PS2 18 (SUTURE) ×3 IMPLANT
SUT PDS AB 1 CTX 36 (SUTURE) ×6 IMPLANT
SUT PROLENE 2 0 SH DA (SUTURE) IMPLANT
SUT PROLENE 3 0 SH DA (SUTURE) ×3 IMPLANT
SUT PROLENE 3 0 SH1 36 (SUTURE) IMPLANT
SUT PROLENE 4 0 RB 1 (SUTURE) ×2
SUT PROLENE 4 0 SH DA (SUTURE) ×3 IMPLANT
SUT PROLENE 4-0 RB1 .5 CRCL 36 (SUTURE) ×4 IMPLANT
SUT PROLENE 5 0 C 1 36 (SUTURE) IMPLANT
SUT PROLENE 6 0 C 1 30 (SUTURE) ×6 IMPLANT
SUT PROLENE 7.0 RB 3 (SUTURE) ×9 IMPLANT
SUT PROLENE 8 0 BV175 6 (SUTURE) IMPLANT
SUT PROLENE BLUE 7 0 (SUTURE) ×3 IMPLANT
SUT PROLENE POLY MONO (SUTURE) IMPLANT
SUT SILK  1 MH (SUTURE) ×1
SUT SILK 1 MH (SUTURE) ×2 IMPLANT
SUT SILK 2 0 SH CR/8 (SUTURE) IMPLANT
SUT SILK 3 0 SH CR/8 (SUTURE) IMPLANT
SUT STEEL 6MS V (SUTURE) IMPLANT
SUT STEEL STERNAL CCS#1 18IN (SUTURE) ×3 IMPLANT
SUT STEEL SZ 6 DBL 3X14 BALL (SUTURE) ×6 IMPLANT
SUT VIC AB 1 CT1 27 (SUTURE) ×1
SUT VIC AB 1 CT1 27XBRD ANBCTR (SUTURE) ×2 IMPLANT
SUT VIC AB 1 CTX 36 (SUTURE)
SUT VIC AB 1 CTX36XBRD ANBCTR (SUTURE) IMPLANT
SUT VIC AB 2-0 CT1 27 (SUTURE)
SUT VIC AB 2-0 CT1 TAPERPNT 27 (SUTURE) IMPLANT
SUT VIC AB 2-0 CTX 27 (SUTURE) IMPLANT
SUT VIC AB 3-0 SH 27 (SUTURE)
SUT VIC AB 3-0 SH 27X BRD (SUTURE) IMPLANT
SUT VIC AB 3-0 X1 27 (SUTURE) IMPLANT
SUT VICRYL 4-0 PS2 18IN ABS (SUTURE) IMPLANT
SUTURE E-PAK OPEN HEART (SUTURE) ×3 IMPLANT
SYSTEM SAHARA CHEST DRAIN ATS (WOUND CARE) ×3 IMPLANT
TOWEL OR 17X24 6PK STRL BLUE (TOWEL DISPOSABLE) ×6 IMPLANT
TOWEL OR 17X26 10 PK STRL BLUE (TOWEL DISPOSABLE) ×6 IMPLANT
TRAY FOLEY IC TEMP SENS 16FR (CATHETERS) ×3 IMPLANT
TUBING INSUFFLATION (TUBING) ×3 IMPLANT
TUBING INSUFFLATION 10FT LAP (TUBING) ×3 IMPLANT
UNDERPAD 30X30 INCONTINENT (UNDERPADS AND DIAPERS) ×3 IMPLANT
VALVE MAGNA EASE AORTIC 23MM (Prosthesis & Implant Heart) ×3 IMPLANT
VENT LEFT HEART 12002 (CATHETERS) ×3
WATER STERILE IRR 1000ML POUR (IV SOLUTION) ×6 IMPLANT

## 2013-07-11 NOTE — Progress Notes (Signed)
Lopressor held heart rate of 55 noted.

## 2013-07-11 NOTE — Progress Notes (Signed)
Patient ID: Sean Brock, male   DOB: March 04, 1935, 79 y.o.   MRN: [id number]   SICU Evening Rounds:   Hemodynamically stable  CI = 2.4    Only on low dose neo  Has started to wake up on vent.   Urine output good  CT output low  CBC    Component Value Date/Time   WBC 15.9* 07/11/2013 1315   RBC 4.67 07/11/2013 1315   HGB 12.6* 07/11/2013 1317   HCT 37.0* 07/11/2013 1317   PLT 125* 07/11/2013 1315   MCV 88.9 07/11/2013 1315   MCH 29.3 07/11/2013 1315   MCHC 33.0 07/11/2013 1315   RDW 13.1 07/11/2013 1315   LYMPHSABS 2.1 06/03/2013 1650   MONOABS 0.5 06/03/2013 1650   EOSABS 0.1 06/03/2013 1650   BASOSABS 0.0 06/03/2013 1650     BMET    Component Value Date/Time   NA 135* 07/11/2013 1317   K 3.4* 07/11/2013 1317   CL 103 07/07/2013 1339   CO2 18* 07/07/2013 1339   GLUCOSE 147* 07/11/2013 1317   BUN 19 07/07/2013 1339   CREATININE 0.76 07/07/2013 1339   CALCIUM 9.1 07/07/2013 1339   GFRNONAA 86* 07/07/2013 1339   GFRAA >90 07/07/2013 1339     A/P:  Stable postop course. Continue current plans.

## 2013-07-11 NOTE — Procedures (Signed)
Extubation Procedure Note  Patient Details:   Name: Sean Brock DOB: Dec 18, 1934 MRN: [id number]   Airway Documentation:     Evaluation  O2 sats: stable throughout Complications: No apparent complications Patient did tolerate procedure well. Bilateral Breath Sounds: Clear;Diminished   Yes  VC= 0.85 L/min NIF= -26 cmH2O. Pt extubated to 4L [place]. Pt able to speak clearly. No complications during or after procedure.   Dulcy Fanny 07/11/2013, 9:00 PM

## 2013-07-11 NOTE — Anesthesia Procedure Notes (Signed)
Procedure Name: Intubation Date/Time: 07/11/2013 8:00 AM Performed by: Jacquiline Doe A Pre-anesthesia Checklist: Patient identified, Timeout performed, Emergency Drugs available, Suction available and Patient being monitored Patient Re-evaluated:Patient Re-evaluated prior to inductionOxygen Delivery Method: Circle system utilized Preoxygenation: Pre-oxygenation with 100% oxygen Intubation Type: IV induction and Cricoid Pressure applied Ventilation: Mask ventilation without difficulty and Oral airway inserted - appropriate to patient size Laryngoscope Size: Mac and 4 Grade View: Grade I Tube type: Oral Number of attempts: 1 Airway Equipment and Method: Stylet Placement Confirmation: ETT inserted through vocal cords under direct vision,  breath sounds checked- equal and bilateral and positive ETCO2 Secured at: 23 cm Tube secured with: Tape Dental Injury: Teeth and Oropharynx as per pre-operative assessment  Comments: Pt Edentulous .

## 2013-07-11 NOTE — Interval H&P Note (Signed)
History and Physical Interval Note:  07/11/2013 7:00 AM  Sean Brock  has presented today for surgery, with the diagnosis of AS CAD  The various methods of treatment have been discussed with the patient and family. After consideration of risks, benefits and other options for treatment, the patient has consented to  Procedure(s): AORTIC VALVE REPLACEMENT (AVR) (N/A) CORONARY ARTERY BYPASS GRAFTING (CABG) (N/A) INTRAOPERATIVE TRANSESOPHAGEAL ECHOCARDIOGRAM (N/A) as a surgical intervention .  The patient's history has been reviewed, patient examined, no change in status, stable for surgery.  I have reviewed the patient's chart and labs.  Questions were answered to the patient's satisfaction.     [name]

## 2013-07-11 NOTE — Progress Notes (Signed)
  Echocardiogram Echocardiogram Transesophageal has been performed.  Sean Brock Sean Brock 07/11/2013, 9:13 AM

## 2013-07-11 NOTE — Op Note (Signed)
CARDIOTHORACIC SURGERY OPERATIVE NOTE  Date of Procedure:  07/11/2013  Preoperative Diagnosis:   Severe Aortic Stenosis  Severe Single-vessel Coronary Artery Disease  Postoperative Diagnosis: Same  Procedure:    Aortic Valve Replacement  Edwards Magna Ease Pericardial Tissue Valve (size 28mm, model # 3300TFX, serial # [id number])   Coronary Artery Bypass Grafting x 1  Saphenous Vein Graft to Posterior Descending Coronary Artery  Endoscopic Vein Harvest from Right Thigh  Surgeon: [name]. 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 The patient's cardiac disease dates back more than 20 years ago at which time he underwent diagnostic cardiac catheterization demonstrating single-vessel coronary artery disease. He was treated medically and followed for years by Dr. Olevia Perches. He developed a heart murmur on physical exam and was found to have aortic stenosis which was followed using serial transthoracic echocardiograms. In March of 2014 the patient suffered a minor stroke manifested by symptoms of left-sided facial droop with aphasia. Symptoms resolved rapidly but MRI revealed evidence of small right parietal stroke, potentially related to embolization. Carotid duplex scan did not reveal significant cerebrovascular disease. The patient recently had a loop recorder placed to rule out the possibility of occult paroxysmal atrial fibrillation,  but so far of the loop recorder has not identified any significant arrhythmias. Over the past year or two the patient has been followed by Dr. Aundra Dubin. During this time the patient has developed decreased exercise tolerance with progressive fatigue as well as intermittent exertional shortness of breath and chest tightness. The patient additionally describes an intermittent history of dizzy spells dating back several years which has been intermittently attributed to vertigo. Followup transthoracic echocardiogram performed 05/26/2013 demonstrated significant progression of the severity of aortic stenosis with peak velocity across the aortic valve measured greater than 4 m/s corresponding to peak and mean transvalvular gradients of 61 and 40 mm mercury respectively. The patient subsequently underwent left and right heart catheterization which revealed the presence of multivessel coronary artery disease including 100% chronic total occlusion of the right coronary artery with left to right collateral filling of the terminal branches of the right coronary system. There was moderate nonobstructive disease in the left coronary system with exception of 80-90% focal ostial stenosis of a small diagonal branch. Pulmonary artery pressures were normal. Transvalvular gradient across the aortic valve was not assessed at catheterization. The patient was referred for possible elective surgical intervention. The patient has been seen in consultation and counseled at length regarding the indications, risks and potential benefits of surgery.  All questions have been answered, and the patient provides full informed consent for the operation as described.     DETAILS OF THE OPERATIVE PROCEDURE  Preparation:  The patient is brought to the operating room on the above mentioned date and central monitoring was established by the anesthesia team including placement of Swan-Ganz catheter and radial arterial line. The patient is placed  in the supine position on the operating table.  Intravenous antibiotics are administered. General endotracheal anesthesia is induced uneventfully. A  Foley catheter is placed.  Baseline transesophageal echocardiogram was performed.  Findings were notable for severe aortic stenosis with normal LV systolic function.  There was moderate tricuspid regurgitation.  The patient's chest, abdomen, both groins, and both lower extremities are prepared and draped in a sterile manner. A time out procedure is performed.   Surgical Approach and Conduit Harvest:  A median sternotomy incision was performed and the pericardium is opened.  The ascending aorta is normal in appearance.  The greater saphenous vein is obtained from the patient's right thigh using endoscopic vein harvest technique. The saphenous vein is notably good quality conduit. After removal of the saphenous vein, the small surgical incisions in the lower extremity are closed with absorbable suture.    Extracorporeal Cardiopulmonary Bypass and Myocardial Protection:  The ascending aorta and the right atrium are cannulated for cardioplegia bypass.  Adequate heparinization is verified.    A retrograde cardioplegia cannula is placed through the right atrium into the coronary sinus.  The operative field was continuously flooded with carbon dioxide gas.  The entire pre-bypass portion of the operation was notable for stable hemodynamics.  Cardiopulmonary bypass was begun and a left ventricular vent placed through the right superior pulmonary vein.  The surface of the heart inspected. Distal target vessels are selected for coronary artery bypass grafting. A cardioplegia cannula is placed in the ascending aorta.  A temperature probe was placed in the interventricular septum.  The patient is cooled to 32C systemic temperature.  The aortic cross clamp is applied and cold blood cardioplegia is delivered initially in an antegrade fashion through the aortic  root.   Supplemental cardioplegia is given retrograde through the coronary sinus catheter.  Iced saline slush is applied for topical hypothermia.  The initial cardioplegic arrest is rapid with early diastolic arrest.  Repeat doses of cardioplegia are administered intermittently throughout the entire cross clamp portion of the operation through the aortic root, through the coronary sinus catheter, and through subsequently placed vein graft in order to maintain completely flat electrocardiogram and septal myocardial temperature below 15C.  Myocardial protection was felt to be excellent.   Coronary Artery Bypass Grafting:  The posterior descending branch of the right coronary artery was grafted using a reversed saphenous vein graft in an end-to-side fashion.  At the site of distal anastomosis the target vessel was good quality and measured approximately 1.8 mm in diameter.   Aortic Valve Replacement:  An oblique transverse aortotomy incision was performed.  The aortic valve was inspected and notable for severe aortic stenosis.  The aortic valve leaflets were excised sharply and the aortic annulus decalcified.  Decalcification was notably straightforward.  The aortic annulus was sized to accept a 23 mm prosthesis.  The aortic root and left ventricle were irrigated with copious cold saline solution.  Aortic valve replacement was performed using interrupted horizontal mattress 2-0 Ethibond pledgeted sutures with pledgets in the subannular position.  An [facility] Ease pericardial tissue valve (size 23 mm, model # 3300TFX, serial # [id number]) was implanted uneventfully. The valve seated appropriately with adequate space beneath the left main and right coronary artery.  The aortotomy was closed using a 2-layer closure of running 4-0 Prolene suture.   Procedure Completion:  All proximal vein graft anastomoses were placed directly to the ascending aorta prior to removal of the aortic cross clamp.   One  final dose of warm retrograde "hot shot" cardioplegia was administered through the coronary sinus catheter while all air was evacuated through the  aortic root.  The aortic cross clamp was removed after a total cross clamp time of 80 minutes.  All proximal and distal coronary anastomoses were inspected for hemostasis and appropriate graft orientation. Epicardial pacing wires are fixed to the right ventricular outflow tract and to the right atrial appendage. The patient is rewarmed to 37C temperature. The aortic and left ventricular vents were removed.  The patient is weaned and disconnected from cardiopulmonary bypass.  The patient's rhythm at separation from bypass was AV paced.  The patient was weaned from cardioplegic bypass without any inotropic support. Total cardiopulmonary bypass time for the operation was 107 minutes.  Followup transesophageal echocardiogram performed after separation from bypass revealed a well-seated bioprosthetic tissue valve in the aortic position that was functioning normally.  There was no perivalvular leak.  There were otherwise no changes from the preoperative exam.  The aortic and venous cannula were removed uneventfully. Protamine was administered to reverse the anticoagulation. The mediastinum and pleural space were inspected for hemostasis and irrigated with saline solution. The mediastinum was drained using 2 chest tubes placed through separate stab incisions inferiorly.  The soft tissues anterior to the aorta were reapproximated loosely. The sternum is closed with double strength sternal wire. The soft tissues anterior to the sternum were closed in multiple layers and the skin is closed with a running subcuticular skin closure.  The post-bypass portion of the operation was notable for stable rhythm and hemodynamics.  No blood products were administered during the operation.   Disposition:  The patient tolerated the procedure well and is transported to the  surgical intensive care in stable condition. There are no intraoperative complications. All sponge instrument and needle counts are verified correct at completion of the operation.   [name]. Roxy Manns MD 07/11/2013 12:33 PM

## 2013-07-11 NOTE — Brief Op Note (Addendum)
07/11/2013  11:18 AM  PATIENT:  Sean Brock  79 y.o. male  PRE-OPERATIVE DIAGNOSIS:  Aortic stenosis; coronary artery disease  POST-OPERATIVE DIAGNOSIS:  Aortic stenosis; Coronary artery disease  PROCEDURE:  Procedure(s):  AORTIC VALVE REPLACEMENT  -23 mm [employer] Ease Pericardial Tissue Valve  CORONARY ARTERY BYPASS GRAFTING x 1 -SVG to PDA  ENDOSCOPIC SAPHENOUS VEIN HARVEST LEFT THIGH  INTRAOPERATIVE TRANSESOPHAGEAL ECHOCARDIOGRAM (N/A)  SURGEON:    [name], MD  ASSISTANTS:  Ellwood Handler, PA-C  ANESTHESIA:   Midge Minium, MD  CROSSCLAMP TIME:   11'  CARDIOPULMONARY BYPASS TIME: 107'  FINDINGS:  Severe calcific aortic stenosis  Normal LV systolic function  Moderate tricuspid regurgitation   Aortic Valve Etiology   Aortic Insufficiency:  Trivial/Trace  Aortic Valve Disease:  Yes.  Aortic Stenosis:  Yes. Smallest Aortic Valve Area: 0.88 cm2; Highest Mean Gradient: 40 mmHg.  Etiology (Choose at least one and up to  5 etiologies):  Degenerative - Calcified   Aortic Valve  Procedure Performed:  Replacement: Yes.  Bioprosthetic Valve. Implant Model Number:3300TFX, Size:23, Unique Device Identifier:[id number]  Repair/Reconstruction: No.   Aortic Annular Enlargement: No.     COMPLICATIONS: None  BASELINE WEIGHT: 86 kg  PATIENT DISPOSITION:   TO SICU IN STABLE CONDITION  [name] 07/11/2013 12:30 PM

## 2013-07-11 NOTE — Transfer of Care (Signed)
Immediate Anesthesia Transfer of Care Note  Patient: Sean Brock  Procedure(s) Performed: Procedure(s): AORTIC VALVE REPLACEMENT (AVR) (N/A) CORONARY ARTERY BYPASS GRAFTING (CABG) x1 (N/A) INTRAOPERATIVE TRANSESOPHAGEAL ECHOCARDIOGRAM (N/A)  Patient Location: SICU  Anesthesia Type:General  Level of Consciousness: sedated and Patient remains intubated per anesthesia plan  Airway & Oxygen Therapy: Patient remains intubated per anesthesia plan and Patient placed on Ventilator (see vital sign flow sheet for setting)  Post-op Assessment: Report given to SICU RN, VIEWED , STABLE .  Post vital signs: Reviewed and stable  Complications: No apparent anesthesia complications

## 2013-07-11 NOTE — Anesthesia Preprocedure Evaluation (Addendum)
Anesthesia Evaluation  Patient identified by MRN, date of birth, ID band Patient awake    Reviewed: Allergy & Precautions, H&P , NPO status , Patient's Chart, lab work & pertinent test results  History of Anesthesia Complications Negative for: history of anesthetic complications  Airway Mallampati: II TM Distance: >3 FB Neck ROM: Full    Dental  (+) Edentulous Upper, Edentulous Lower   Pulmonary former smoker (quit '11),  breath sounds clear to auscultation  Pulmonary exam normal       Cardiovascular hypertension, Pt. on medications - angina+ CAD (100% RCA, 80% diag) + Valvular Problems/Murmurs (severe AS: AVA 1.02, mean grad 41 mmHg) AS Rhythm:Regular Rate:Normal + Systolic murmurs (III/VI)    Neuro/Psych CVA negative neurological ROS     GI/Hepatic Neg liver ROS, GERD-  Controlled and Medicated,  Endo/Other  negative endocrine ROS  Renal/GU negative Renal ROS     Musculoskeletal   Abdominal   Peds  Hematology negative hematology ROS (+)   Anesthesia Other Findings   Reproductive/Obstetrics                        Anesthesia Physical Anesthesia Plan  ASA: III  Anesthesia Plan: General   Post-op Pain Management:    Induction: Intravenous  Airway Management Planned: Oral ETT  Additional Equipment: Arterial line, PA Cath, 3D TEE and Ultrasound Guidance Line Placement  Intra-op Plan:   Post-operative Plan: Post-operative intubation/ventilation  Informed Consent: I have reviewed the patients History and Physical, chart, labs and discussed the procedure including the risks, benefits and alternatives for the proposed anesthesia with the patient or authorized representative who has indicated his/her understanding and acceptance.     Plan Discussed with: CRNA and Surgeon  Anesthesia Plan Comments: (Plan routine monitors, A line, PA cath, GETA with TEE and post op ventilation)         Anesthesia Quick Evaluation

## 2013-07-12 ENCOUNTER — Inpatient Hospital Stay (HOSPITAL_COMMUNITY): Payer: Medicare Other

## 2013-07-12 LAB — CBC
HCT: 36.6 % — ABNORMAL LOW (ref 39.0–52.0)
HCT: 38.9 % — ABNORMAL LOW (ref 39.0–52.0)
HEMOGLOBIN: 12.4 g/dL — AB (ref 13.0–17.0)
Hemoglobin: 12.9 g/dL — ABNORMAL LOW (ref 13.0–17.0)
MCH: 29.7 pg (ref 26.0–34.0)
MCH: 29.6 pg (ref 26.0–34.0)
MCHC: 33.9 g/dL (ref 30.0–36.0)
MCHC: 33.2 g/dL (ref 30.0–36.0)
MCV: 87.6 fL (ref 78.0–100.0)
MCV: 89.2 fL (ref 78.0–100.0)
PLATELETS: 104 10*3/uL — AB (ref 150–400)
Platelets: 105 10*3/uL — ABNORMAL LOW (ref 150–400)
RBC: 4.18 MIL/uL — ABNORMAL LOW (ref 4.22–5.81)
RBC: 4.36 MIL/uL (ref 4.22–5.81)
RDW: 13.3 % (ref 11.5–15.5)
RDW: 13.7 % (ref 11.5–15.5)
WBC: 12.9 10*3/uL — ABNORMAL HIGH (ref 4.0–10.5)
WBC: 14.5 10*3/uL — ABNORMAL HIGH (ref 4.0–10.5)

## 2013-07-12 LAB — POCT I-STAT, CHEM 8
BUN: 18 mg/dL (ref 6–23)
Calcium, Ion: 1.18 mmol/L (ref 1.13–1.30)
Chloride: 99 mEq/L (ref 96–112)
Creatinine, Ser: 1 mg/dL (ref 0.50–1.35)
GLUCOSE: 137 mg/dL — AB (ref 70–99)
HCT: 39 % (ref 39.0–52.0)
HEMOGLOBIN: 13.3 g/dL (ref 13.0–17.0)
POTASSIUM: 3.9 meq/L (ref 3.7–5.3)
Sodium: 141 mEq/L (ref 137–147)
TCO2: 23 mmol/L (ref 0–100)

## 2013-07-12 LAB — GLUCOSE, CAPILLARY
GLUCOSE-CAPILLARY: 143 mg/dL — AB (ref 70–99)
Glucose-Capillary: 146 mg/dL — ABNORMAL HIGH (ref 70–99)
Glucose-Capillary: 151 mg/dL — ABNORMAL HIGH (ref 70–99)
Glucose-Capillary: 156 mg/dL — ABNORMAL HIGH (ref 70–99)

## 2013-07-12 LAB — BASIC METABOLIC PANEL
BUN: 14 mg/dL (ref 6–23)
CALCIUM: 7.6 mg/dL — AB (ref 8.4–10.5)
CO2: 22 meq/L (ref 19–32)
CREATININE: 0.6 mg/dL (ref 0.50–1.35)
Chloride: 109 mEq/L (ref 96–112)
GFR calc Af Amer: >90 mL/min (ref >90)
GFR calc non Af Amer: >90 mL/min (ref >90)
Glucose, Bld: 148 mg/dL — ABNORMAL HIGH (ref 70–99)
Potassium: 4.2 mEq/L (ref 3.7–5.3)
Sodium: 142 mEq/L (ref 137–147)

## 2013-07-12 LAB — POCT I-STAT 3, ART BLOOD GAS (G3+)
Acid-base deficit: 4 mmol/L — ABNORMAL HIGH (ref 0.0–2.0)
BICARBONATE: 20.6 meq/L (ref 20.0–24.0)
O2 Saturation: 95 %
PO2 ART: 75 mmHg — AB (ref 80.0–100.0)
Patient temperature: 36.8
TCO2: 22 mmol/L (ref 0–100)
pCO2 arterial: 35.8 mmHg (ref 35.0–45.0)
pH, Arterial: 7.366 (ref 7.350–7.450)

## 2013-07-12 LAB — CREATININE, SERUM
CREATININE: 0.76 mg/dL (ref 0.50–1.35)
GFR calc Af Amer: >90 mL/min (ref >90)
GFR calc non Af Amer: 86 mL/min — ABNORMAL LOW (ref >90)

## 2013-07-12 LAB — MAGNESIUM
Magnesium: 2.1 mg/dL (ref 1.5–2.5)
Magnesium: 2.1 mg/dL (ref 1.5–2.5)

## 2013-07-12 MED ORDER — FUROSEMIDE 10 MG/ML IJ SOLN
40.0000 mg | Freq: Once | INTRAMUSCULAR | Status: AC
  Administered 2013-07-12: 40 mg via INTRAVENOUS

## 2013-07-12 MED ORDER — ENOXAPARIN SODIUM 40 MG/0.4ML ~~LOC~~ SOLN
40.0000 mg | Freq: Every day | SUBCUTANEOUS | Status: DC
  Administered 2013-07-12: 40 mg via SUBCUTANEOUS
  Filled 2013-07-12 (×2): qty 0.4

## 2013-07-12 MED ORDER — INSULIN ASPART 100 UNIT/ML ~~LOC~~ SOLN
0.0000 [IU] | SUBCUTANEOUS | Status: DC
  Administered 2013-07-12 – 2013-07-13 (×5): 2 [IU] via SUBCUTANEOUS

## 2013-07-12 NOTE — Progress Notes (Signed)
1 Day Post-Op Procedure(s) (LRB): AORTIC VALVE REPLACEMENT (AVR) (N/A) CORONARY ARTERY BYPASS GRAFTING (CABG) x1 (N/A) INTRAOPERATIVE TRANSESOPHAGEAL ECHOCARDIOGRAM (N/A) Subjective:  No complaints  Objective: Vital signs in last 24 hours: Temp:  [95.7 F (35.4 C)-98.4 F (36.9 C)] 97.9 F (36.6 C) (05/09 0900) Pulse Rate:  [70-90] 77 (05/09 0900) Cardiac Rhythm:  [-] Normal sinus rhythm (05/09 0800) Resp:  [0-28] 16 (05/09 0900) BP: (75-132)/(46-78) 122/70 mmHg (05/09 0900) SpO2:  [94 %-100 %] 97 % (05/09 0900) Arterial Line BP: (85-161)/(51-82) 161/62 mmHg (05/09 0900) FiO2 (%):  [40 %-50 %] 40 % (05/08 2020) Weight:  [93.8 kg (206 lb 12.7 oz)] 93.8 kg (206 lb 12.7 oz) (05/09 0500)  Hemodynamic parameters for last 24 hours: PAP: (17-61)/(5-48) 24/6 mmHg CO:  [4.3 L/min-7 L/min] 5.5 L/min CI:  [2 L/min/m2-3.3 L/min/m2] 2.6 L/min/m2  Intake/Output from previous day: 05/08 0701 - 05/09 0700 In: 8070.8 [I.V.:5330.8; Blood:1240; IV Piggyback:1500] Out: 0737 [Urine:3660; Chest Tube:450] Intake/Output this shift: Total I/O In: 116 [I.V.:66; IV Piggyback:50] Out: 50 [Urine:30; Chest Tube:20]  General appearance: alert and cooperative Neurologic: intact Heart: regular rate and rhythm, S1, S2 normal, no murmur, click, rub or gallop Lungs: clear to auscultation bilaterally Extremities: edema mild Wound: dressing dry  Lab Results:  Recent Labs  07/11/13 1900 07/12/13 0430  WBC 14.1* 12.9*  HGB 13.1 12.4*  HCT 38.2* 36.6*  PLT 126* 105*   BMET:  Recent Labs  07/11/13 1851 07/11/13 1900 07/12/13 0430  NA 136*  --  142  K 3.6*  --  4.2  CL 114*  --  109  CO2  --   --  22  GLUCOSE 138*  --  148*  BUN 11  --  14  CREATININE 0.60 0.57 0.60  CALCIUM  --   --  7.6*    PT/INR:  Recent Labs  07/11/13 1315  LABPROT 16.8*  INR 1.40   ABG    Component Value Date/Time   PHART 7.366 07/12/2013 0431   HCO3 20.6 07/12/2013 0431   TCO2 22 07/12/2013 0431   ACIDBASEDEF  4.0* 07/12/2013 0431   O2SAT 95.0 07/12/2013 0431   CBG (last 3)   Recent Labs  07/11/13 2031 07/12/13 0015 07/12/13 0750  GLUCAP 135* 151* 146*    Assessment/Plan: S/P Procedure(s) (LRB): AORTIC VALVE REPLACEMENT (AVR) (N/A) CORONARY ARTERY BYPASS GRAFTING (CABG) x1 (N/A) INTRAOPERATIVE TRANSESOPHAGEAL ECHOCARDIOGRAM (N/A) Mobilize Diuresis Diabetes control d/c tubes/lines Continue foley due to diuresing patient and patient in ICU See progression orders   LOS: 1 day    Gaye Pollack 07/12/2013

## 2013-07-12 NOTE — Progress Notes (Signed)
Patient ID: Sean Brock, male   DOB: 01/06/1935, 79 y.o.   MRN: [id number]  SICU Evening Rounds:  Hemodynamically stable  sats 95%  Diuresed today.  BMET    Component Value Date/Time   NA 141 07/12/2013 1711   K 3.9 07/12/2013 1711   CL 99 07/12/2013 1711   CO2 22 07/12/2013 0430   GLUCOSE 137* 07/12/2013 1711   BUN 18 07/12/2013 1711   CREATININE 1.00 07/12/2013 1711   CALCIUM 7.6* 07/12/2013 0430   GFRNONAA 86* 07/12/2013 1700   GFRAA >90 07/12/2013 1700    CBC    Component Value Date/Time   WBC 14.5* 07/12/2013 1700   RBC 4.36 07/12/2013 1700   HGB 13.3 07/12/2013 1711   HCT 39.0 07/12/2013 1711   PLT 104* 07/12/2013 1700   MCV 89.2 07/12/2013 1700   MCH 29.6 07/12/2013 1700   MCHC 33.2 07/12/2013 1700   RDW 13.7 07/12/2013 1700   LYMPHSABS 2.1 06/03/2013 1650   MONOABS 0.5 06/03/2013 1650   EOSABS 0.1 06/03/2013 1650   BASOSABS 0.0 06/03/2013 1650    Continue present course.

## 2013-07-13 ENCOUNTER — Inpatient Hospital Stay (HOSPITAL_COMMUNITY): Payer: Medicare Other

## 2013-07-13 LAB — BASIC METABOLIC PANEL
BUN: 23 mg/dL (ref 6–23)
CALCIUM: 8 mg/dL — AB (ref 8.4–10.5)
CHLORIDE: 103 meq/L (ref 96–112)
CO2: 26 mEq/L (ref 19–32)
CREATININE: 0.74 mg/dL (ref 0.50–1.35)
GFR calc non Af Amer: 87 mL/min — ABNORMAL LOW (ref >90)
Glucose, Bld: 102 mg/dL — ABNORMAL HIGH (ref 70–99)
Potassium: 3.8 mEq/L (ref 3.7–5.3)
Sodium: 137 mEq/L (ref 137–147)

## 2013-07-13 LAB — GLUCOSE, CAPILLARY
GLUCOSE-CAPILLARY: 144 mg/dL — AB (ref 70–99)
Glucose-Capillary: 111 mg/dL — ABNORMAL HIGH (ref 70–99)
Glucose-Capillary: 119 mg/dL — ABNORMAL HIGH (ref 70–99)
Glucose-Capillary: 122 mg/dL — ABNORMAL HIGH (ref 70–99)
Glucose-Capillary: 131 mg/dL — ABNORMAL HIGH (ref 70–99)
Glucose-Capillary: 133 mg/dL — ABNORMAL HIGH (ref 70–99)
Glucose-Capillary: 135 mg/dL — ABNORMAL HIGH (ref 70–99)
Glucose-Capillary: 95 mg/dL (ref 70–99)

## 2013-07-13 LAB — CBC
HEMATOCRIT: 36.3 % — AB (ref 39.0–52.0)
Hemoglobin: 11.9 g/dL — ABNORMAL LOW (ref 13.0–17.0)
MCH: 29.5 pg (ref 26.0–34.0)
MCHC: 32.8 g/dL (ref 30.0–36.0)
MCV: 89.9 fL (ref 78.0–100.0)
Platelets: 98 10*3/uL — ABNORMAL LOW (ref 150–400)
RBC: 4.04 MIL/uL — ABNORMAL LOW (ref 4.22–5.81)
RDW: 13.8 % (ref 11.5–15.5)
WBC: 11.2 10*3/uL — ABNORMAL HIGH (ref 4.0–10.5)

## 2013-07-13 MED ORDER — ALUM & MAG HYDROXIDE-SIMETH 200-200-20 MG/5ML PO SUSP
30.0000 mL | ORAL | Status: DC | PRN
  Administered 2013-07-13: 30 mL via ORAL
  Filled 2013-07-13: qty 30

## 2013-07-13 MED ORDER — FUROSEMIDE 40 MG PO TABS
40.0000 mg | ORAL_TABLET | Freq: Every day | ORAL | Status: DC
  Administered 2013-07-13 – 2013-07-15 (×3): 40 mg via ORAL
  Filled 2013-07-13 (×3): qty 1

## 2013-07-13 MED ORDER — ASPIRIN EC 325 MG PO TBEC
325.0000 mg | DELAYED_RELEASE_TABLET | Freq: Every day | ORAL | Status: DC
  Administered 2013-07-13: 325 mg via ORAL
  Filled 2013-07-13 (×2): qty 1

## 2013-07-13 MED ORDER — TAMSULOSIN HCL 0.4 MG PO CAPS
0.4000 mg | ORAL_CAPSULE | Freq: Every day | ORAL | Status: DC
  Administered 2013-07-13 – 2013-07-14 (×2): 0.4 mg via ORAL
  Filled 2013-07-13 (×4): qty 1

## 2013-07-13 MED ORDER — SODIUM CHLORIDE 0.9 % IJ SOLN
3.0000 mL | Freq: Two times a day (BID) | INTRAMUSCULAR | Status: DC
  Administered 2013-07-13: 22:00:00 via INTRAVENOUS
  Administered 2013-07-14 (×2): 3 mL via INTRAVENOUS

## 2013-07-13 MED ORDER — SODIUM CHLORIDE 0.9 % IV SOLN: 250.0000 mL | INTRAVENOUS | Status: DC | PRN

## 2013-07-13 MED ORDER — ONDANSETRON HCL 4 MG PO TABS: 4.0000 mg | ORAL_TABLET | Freq: Four times a day (QID) | ORAL | Status: DC | PRN

## 2013-07-13 MED ORDER — MOVING RIGHT ALONG BOOK
Freq: Once | Status: AC
  Administered 2013-07-13: 14:00:00
  Filled 2013-07-13: qty 1

## 2013-07-13 MED ORDER — ATORVASTATIN CALCIUM 20 MG PO TABS
20.0000 mg | ORAL_TABLET | Freq: Every day | ORAL | Status: DC
  Administered 2013-07-13 – 2013-07-14 (×2): 20 mg via ORAL
  Filled 2013-07-13 (×3): qty 1

## 2013-07-13 MED ORDER — TRAMADOL HCL 50 MG PO TABS
50.0000 mg | ORAL_TABLET | ORAL | Status: DC | PRN
  Filled 2013-07-13: qty 2

## 2013-07-13 MED ORDER — OXYCODONE HCL 5 MG PO TABS
5.0000 mg | ORAL_TABLET | ORAL | Status: DC | PRN
  Administered 2013-07-13 – 2013-07-15 (×10): 10 mg via ORAL
  Filled 2013-07-13 (×10): qty 2

## 2013-07-13 MED ORDER — ONDANSETRON HCL 4 MG/2ML IJ SOLN: 4.0000 mg | Freq: Four times a day (QID) | INTRAMUSCULAR | Status: DC | PRN

## 2013-07-13 MED ORDER — PANTOPRAZOLE SODIUM 40 MG PO TBEC
40.0000 mg | DELAYED_RELEASE_TABLET | Freq: Every day | ORAL | Status: DC
  Administered 2013-07-14 – 2013-07-15 (×2): 40 mg via ORAL
  Filled 2013-07-13 (×3): qty 1

## 2013-07-13 MED ORDER — ACETAMINOPHEN 325 MG PO TABS: 650.0000 mg | ORAL_TABLET | Freq: Four times a day (QID) | ORAL | Status: DC | PRN

## 2013-07-13 MED ORDER — SODIUM CHLORIDE 0.9 % IJ SOLN: 3.0000 mL | INTRAMUSCULAR | Status: DC | PRN

## 2013-07-13 MED ORDER — POTASSIUM CHLORIDE CRYS ER 20 MEQ PO TBCR
20.0000 meq | EXTENDED_RELEASE_TABLET | Freq: Two times a day (BID) | ORAL | Status: DC
  Administered 2013-07-13 – 2013-07-15 (×5): 20 meq via ORAL
  Filled 2013-07-13 (×7): qty 1

## 2013-07-13 NOTE — Progress Notes (Signed)
Pt transferred to 2W22. Report called to RN prior to departure and bedside report given on arrival. Pt had VS WNL, GCS 15, AAOX4 with no complaints of pain. Pt safety was maintained at all times.

## 2013-07-13 NOTE — Progress Notes (Addendum)
2 Days Post-Op Procedure(s) (LRB): AORTIC VALVE REPLACEMENT (AVR) (N/A) CORONARY ARTERY BYPASS GRAFTING (CABG) x1 (N/A) INTRAOPERATIVE TRANSESOPHAGEAL ECHOCARDIOGRAM (N/A) Subjective:  Had some dizziness this am but BP was ok. May have been pain meds  Objective: Vital signs in last 24 hours: Temp:  [97.6 F (36.4 C)-98.7 F (37.1 C)] 97.8 F (36.6 C) (05/10 0800) Pulse Rate:  [59-83] 80 (05/10 1100) Cardiac Rhythm:  [-] Atrial paced (05/10 0800) Resp:  [8-22] 8 (05/10 1030) BP: (97-134)/(51-76) 109/76 mmHg (05/10 1100) SpO2:  [90 %-99 %] 94 % (05/10 1030) Arterial Line BP: (129-150)/(54-73) 150/73 mmHg (05/09 1400) Weight:  [92.9 kg (204 lb 12.9 oz)] 92.9 kg (204 lb 12.9 oz) (05/10 0600)  Hemodynamic parameters for last 24 hours:    Intake/Output from previous day: 05/09 0701 - 05/10 0700 In: 2149 [P.O.:1680; I.V.:419; IV Piggyback:50] Out: 2050 [Urine:1990; Chest Tube:60] Intake/Output this shift: Total I/O In: 110 [I.V.:60; IV Piggyback:50] Out: 135 [Urine:135]  General appearance: alert and cooperative Neurologic: intact Heart: regular rate and rhythm, S1, S2 normal, no murmur, click, rub or gallop Lungs: clear to auscultation bilaterally Extremities: edema mild Wound: incision ok  Lab Results:  Recent Labs  07/12/13 1700 07/12/13 1711 07/13/13 0405  WBC 14.5*  --  11.2*  HGB 12.9* 13.3 11.9*  HCT 38.9* 39.0 36.3*  PLT 104*  --  98*   BMET:  Recent Labs  07/12/13 0430  07/12/13 1711 07/13/13 0405  NA 142  --  141 137  K 4.2  --  3.9 3.8  CL 109  --  99 103  CO2 22  --   --  26  GLUCOSE 148*  --  137* 102*  BUN 14  --  18 23  CREATININE 0.60  < > 1.00 0.74  CALCIUM 7.6*  --   --  8.0*  < > = values in this interval not displayed.  PT/INR:  Recent Labs  07/11/13 1315  LABPROT 16.8*  INR 1.40   ABG    Component Value Date/Time   PHART 7.366 07/12/2013 0431   HCO3 20.6 07/12/2013 0431   TCO2 23 07/12/2013 1711   ACIDBASEDEF 4.0* 07/12/2013 0431    O2SAT 95.0 07/12/2013 0431   CBG (last 3)   Recent Labs  07/12/13 1945 07/13/13 0001 07/13/13 0334  GLUCAP 144* 122* 95    Assessment/Plan: S/P Procedure(s) (LRB): AORTIC VALVE REPLACEMENT (AVR) (N/A) CORONARY ARTERY BYPASS GRAFTING (CABG) x1 (N/A) INTRAOPERATIVE TRANSESOPHAGEAL ECHOCARDIOGRAM (N/A) He has been hemodynamically stable. Put back on AAI pacing this am when he was dizzy. His underlying HR is 54 sinus. He received Lopressor last night but none this am. Will hold off on beta blocker for now. Mobilize Diuresis Thrombocytopenia: platelets a little lower than yesterday. Will stop Lovenox. Plan for transfer to step-down: see transfer orders   LOS: 2 days    Gaye Pollack 07/13/2013

## 2013-07-14 ENCOUNTER — Encounter (HOSPITAL_COMMUNITY): Payer: Self-pay | Admitting: Thoracic Surgery (Cardiothoracic Vascular Surgery)

## 2013-07-14 LAB — CBC
HEMATOCRIT: 36.4 % — AB (ref 39.0–52.0)
HEMOGLOBIN: 11.9 g/dL — AB (ref 13.0–17.0)
MCH: 29.5 pg (ref 26.0–34.0)
MCHC: 32.7 g/dL (ref 30.0–36.0)
MCV: 90.3 fL (ref 78.0–100.0)
Platelets: 100 10*3/uL — ABNORMAL LOW (ref 150–400)
RBC: 4.03 MIL/uL — ABNORMAL LOW (ref 4.22–5.81)
RDW: 13.7 % (ref 11.5–15.5)
WBC: 10.4 10*3/uL (ref 4.0–10.5)

## 2013-07-14 LAB — BASIC METABOLIC PANEL
BUN: 25 mg/dL — AB (ref 6–23)
CHLORIDE: 100 meq/L (ref 96–112)
CO2: 25 meq/L (ref 19–32)
CREATININE: 0.8 mg/dL (ref 0.50–1.35)
Calcium: 8.4 mg/dL (ref 8.4–10.5)
GFR calc Af Amer: >90 mL/min (ref >90)
GFR calc non Af Amer: 84 mL/min — ABNORMAL LOW (ref >90)
Glucose, Bld: 123 mg/dL — ABNORMAL HIGH (ref 70–99)
Potassium: 4.3 mEq/L (ref 3.7–5.3)
Sodium: 138 mEq/L (ref 137–147)

## 2013-07-14 LAB — GLUCOSE, CAPILLARY
GLUCOSE-CAPILLARY: 135 mg/dL — AB (ref 70–99)
Glucose-Capillary: 117 mg/dL — ABNORMAL HIGH (ref 70–99)
Glucose-Capillary: 163 mg/dL — ABNORMAL HIGH (ref 70–99)

## 2013-07-14 MED ORDER — ASPIRIN EC 81 MG PO TBEC
81.0000 mg | DELAYED_RELEASE_TABLET | Freq: Every day | ORAL | Status: DC
  Administered 2013-07-14 – 2013-07-15 (×2): 81 mg via ORAL
  Filled 2013-07-14 (×2): qty 1

## 2013-07-14 MED ORDER — CLOPIDOGREL BISULFATE 75 MG PO TABS
75.0000 mg | ORAL_TABLET | Freq: Every day | ORAL | Status: DC
  Filled 2013-07-14 (×2): qty 1

## 2013-07-14 NOTE — Significant Event (Signed)
0945-Epicardial pacing wires removed per order following protocol and using sterile techniques. End tips of EPWs are all intact and no blood or tissues noted on them. No bleeding after EPWs pulled. Sterile vasoline gauze, sterile dry gauze dressing placed on sites. Patient tolerated procedure well. VS obtained Q58minutes. Patient is bedrest, call-bell within reach; family at bedside. Will continue to monitor. Ranae Casebier, Therapist, sports.

## 2013-07-14 NOTE — Progress Notes (Addendum)
[address]       [place] [address]             [phone]      3 Days Post-Op  Procedure(s) (LRB): AORTIC VALVE REPLACEMENT (AVR) (N/A) CORONARY ARTERY BYPASS GRAFTING (CABG) x1 (N/A) INTRAOPERATIVE TRANSESOPHAGEAL ECHOCARDIOGRAM (N/A) Subjective: Feels ok overall   Objective  Telemetry apaced, occ pvc's   Temp:  [97.7 F (36.5 C)-98.6 F (37 C)] 97.7 F (36.5 C) (05/11 0430) Pulse Rate:  [59-81] 79 (05/11 0430) Resp:  [8-18] 17 (05/11 0430) BP: (97-129)/(51-76) 129/64 mmHg (05/11 0430) SpO2:  [90 %-97 %] 95 % (05/11 0430) Weight:  [204 lb 2.3 oz (92.6 kg)] 204 lb 2.3 oz (92.6 kg) (05/11 0430)   Intake/Output Summary (Last 24 hours) at 07/14/13 0751 Last data filed at 07/14/13 0500  Gross per 24 hour  Intake    150 ml  Output    925 ml  Net   -775 ml       General appearance: alert, cooperative and no distress Heart: regular rate and rhythm and soft sys murmur Lungs: clear to auscultation bilaterally Abdomen: benign Extremities: minor edema Wound: incis healing well  Lab Results:  Recent Labs  07/12/13 0430 07/12/13 1700  07/13/13 0405 07/14/13 0530  NA 142  --   < > 137 138  K 4.2  --   < > 3.8 4.3  CL 109  --   < > 103 100  CO2 22  --   --  26 25  GLUCOSE 148*  --   < > 102* 123*  BUN 14  --   < > 23 25*  CREATININE 0.60 0.76  < > 0.74 0.80  CALCIUM 7.6*  --   --  8.0* 8.4  MG 2.1 2.1  --   --   --   < > = values in this interval not displayed. No results found for this basename: AST, ALT, ALKPHOS, BILITOT, PROT, ALBUMIN,  in the last 72 hours No results found for this basename: LIPASE, AMYLASE,  in the last 72 hours  Recent Labs  07/13/13 0405 07/14/13 0530  WBC 11.2* 10.4  HGB 11.9* 11.9*  HCT 36.3* 36.4*  MCV 89.9 90.3  PLT 98* 100*   No results found for this basename: CKTOTAL, CKMB, TROPONINI,  in the last 72 hours No components found with this basename: POCBNP,  No results found for this basename:  DDIMER,  in the last 72 hours No results found for this basename: HGBA1C,  in the last 72 hours No results found for this basename: CHOL, HDL, LDLCALC, TRIG, CHOLHDL,  in the last 72 hours No results found for this basename: TSH, T4TOTAL, FREET3, T3FREE, THYROIDAB,  in the last 72 hours No results found for this basename: VITAMINB12, FOLATE, FERRITIN, TIBC, IRON, RETICCTPCT,  in the last 72 hours  Medications: Scheduled . aspirin EC  325 mg Oral Daily  . atorvastatin  20 mg Oral q1800  . furosemide  40 mg Oral Daily  . pantoprazole  40 mg Oral QAC breakfast  . potassium chloride  20 mEq Oral BID  . sodium chloride  3 mL Intravenous Q12H  . tamsulosin  0.4 mg Oral QHS     Radiology/Studies:  Dg Chest Port 1 View  07/13/2013   CLINICAL DATA:  postop cardiac surgery  EXAM: PORTABLE CHEST - 1 VIEW  COMPARISON:  DG CHEST 1V PORT dated 07/12/2013  FINDINGS: Low lung volumes.  Patient is status post median sternotomy and coronary artery bypass grafting. The Swan-Ganz catheter and mediastinal drain have been removed. Right internal jugular sheath tip superior vena cava. Stable atelectasis left lung base. Lungs otherwise clear. No acute osseous abnormalities degenerative changes are appreciated within the shoulders.  IMPRESSION: Persistent atelectasis right lung base. Otherwise no acute cardiopulmonary disease.   Electronically Signed   By: Margaree Mackintosh M.D.   On: 07/13/2013 10:53    INR: Will add last result for INR, ABG once components are confirmed Will add last 4 CBG results once components are confirmed  Assessment/Plan: S/P Procedure(s) (LRB): AORTIC VALVE REPLACEMENT (AVR) (N/A) CORONARY ARTERY BYPASS GRAFTING (CABG) x1 (N/A) INTRAOPERATIVE TRANSESOPHAGEAL ECHOCARDIOGRAM (N/A)  1 doing well , making good progress 2 cont pacing , rate in 60's 3 cont gentle diuresis 4 platelets stable at 100, wife says he was on plavix for CVA previously.  5 cont rehab    LOS: 3 days    John Giovanni 5/11/20157:51 AM  I have seen and examined the patient and agree with the assessment and plan as outlined.  Maintaining NSR - will d/c pacing wires.  Resume Plavix although source of patient's stroke was never identified.  [name] 07/14/2013 8:34 AM

## 2013-07-14 NOTE — Progress Notes (Signed)
CARDIAC REHAB PHASE I   PRE:  Rate/Rhythm: 26 SR with PVCs  BP:  Sitting: 143/71      SaO2: 92 RA  MODE:  Ambulation: 300 ft   POST:  Rate/Rhythm: 82 SR with PVCs  BP:  Sitting: 149/65     SaO2: 96 RA 1053-1125 Patient ambulated with RW x 1 assist. Steady gait but taking short "baby" steps. Encouraged pt to have a more normal slow stride as he did prior to admit. Gait became more steady with change in ambulation. Denied complaints of dizziness or DOE. Has a history of frequent vertigo per pt. Wife verbalized pt was walking independently prior to admit, but has a Rollator and shower chair at home if needed at discharge. Pt observed proper sitting technique post ambulation as pt requested to sit in chair. Wife at bedside and call bell in reach.  Bernisha Verma English PayneRN, BSN 07/14/2013 11:27 AM

## 2013-07-14 NOTE — Anesthesia Postprocedure Evaluation (Signed)
  Anesthesia Post-op Note  Patient: Sean Brock  Procedure(s) Performed: Procedure(s): AORTIC VALVE REPLACEMENT (AVR) (N/A) CORONARY ARTERY BYPASS GRAFTING (CABG) x1 (N/A) INTRAOPERATIVE TRANSESOPHAGEAL ECHOCARDIOGRAM (N/A)  Patient Location: Nursing Unit  Anesthesia Type:General  Level of Consciousness: awake, alert , oriented and patient cooperative  Airway and Oxygen Therapy: Patient Spontanous Breathing  Post-op Pain: moderate  Post-op Assessment: Post-op Vital signs reviewed, Patient's Cardiovascular Status Stable, Respiratory Function Stable, Patent Airway, No signs of Nausea or vomiting, Adequate PO intake and Pain level controlled  Post-op Vital Signs: Reviewed and stable  Last Vitals:  Filed Vitals:   07/14/13 1514  BP: 150/73  Pulse: 59  Temp: 36.4 C  Resp: 20    Complications: No apparent anesthesia complications

## 2013-07-14 NOTE — Progress Notes (Signed)
Patient ambulated in the hall with wife and walker for 150 ft. Patient on complaint of pain, sob no rest stops. Patient returned to room with call bell in reach and wife at bedside. Will continue to monitor.

## 2013-07-14 NOTE — Progress Notes (Signed)
Pt ambulated approximately 300 ft accompanied by wife. Pt currently resting in bed with call bell within reach. Will continue to monitor.   Coolidge Breeze

## 2013-07-15 ENCOUNTER — Telehealth: Payer: Self-pay | Admitting: Cardiology

## 2013-07-15 MED ORDER — OXYCODONE HCL 5 MG PO TABS: 5.0000 mg | ORAL_TABLET | ORAL | Status: DC | PRN

## 2013-07-15 MED ORDER — AMLODIPINE BESYLATE 10 MG PO TABS
10.0000 mg | ORAL_TABLET | Freq: Every day | ORAL | Status: DC
Start: 2013-07-15 — End: 2013-07-15
  Filled 2013-07-15: qty 1

## 2013-07-15 MED ORDER — POTASSIUM CHLORIDE CRYS ER 20 MEQ PO TBCR: 20.0000 meq | EXTENDED_RELEASE_TABLET | Freq: Two times a day (BID) | ORAL | Status: DC

## 2013-07-15 MED ORDER — METOPROLOL TARTRATE 12.5 MG HALF TABLET
12.5000 mg | ORAL_TABLET | Freq: Two times a day (BID) | ORAL | Status: DC
  Administered 2013-07-15: 12.5 mg via ORAL
  Filled 2013-07-15 (×2): qty 1

## 2013-07-15 MED ORDER — FUROSEMIDE 40 MG PO TABS: 40.0000 mg | ORAL_TABLET | Freq: Every day | ORAL | Status: DC

## 2013-07-15 MED ORDER — CLOPIDOGREL BISULFATE 75 MG PO TABS: 75.0000 mg | ORAL_TABLET | Freq: Every day | ORAL | Status: DC

## 2013-07-15 MED ORDER — ASPIRIN 81 MG PO TBEC
81.0000 mg | DELAYED_RELEASE_TABLET | Freq: Every day | ORAL | Status: AC
Start: 2013-07-15 — End: ?

## 2013-07-15 MED ORDER — METOPROLOL TARTRATE 12.5 MG HALF TABLET: 12.5000 mg | ORAL_TABLET | Freq: Two times a day (BID) | ORAL | Status: DC

## 2013-07-15 MED FILL — Heparin Sodium (Porcine) Inj 1000 Unit/ML: INTRAMUSCULAR | Qty: 30 | Status: AC

## 2013-07-15 MED FILL — Magnesium Sulfate Inj 50%: INTRAMUSCULAR | Qty: 10 | Status: AC

## 2013-07-15 MED FILL — Potassium Chloride Inj 2 mEq/ML: INTRAVENOUS | Qty: 40 | Status: AC

## 2013-07-15 NOTE — Discharge Summary (Signed)
[address]       [place] [address]             [phone]       Sean Brock 17-Feb-1935 79 y.o. 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 The patient's cardiac disease dates back more than 20 years ago at which time he underwent diagnostic cardiac catheterization demonstrating single-vessel coronary artery disease. He was treated medically and followed for years by Dr. Olevia Perches. He developed a heart murmur on physical exam and was found to have aortic stenosis which was followed using serial transthoracic echocardiograms. In March of 2014 the patient suffered a minor stroke manifested by symptoms of left-sided facial droop with aphasia. Symptoms resolved rapidly but MRI revealed evidence of small right parietal stroke, potentially related to embolization. Carotid duplex scan did not reveal significant cerebrovascular disease. The patient recently had a loop recorder placed to rule out the possibility of occult paroxysmal atrial fibrillation, but so far of the loop recorder has not identified any significant arrhythmias. Over the past year or two the patient has been followed by Dr. Aundra Dubin. During this time the patient has developed decreased exercise tolerance with progressive fatigue as well as intermittent exertional shortness of breath and chest tightness. The patient additionally describes an intermittent history of dizzy spells dating back several years which has been intermittently attributed to vertigo. Followup transthoracic echocardiogram performed 05/26/2013 demonstrated significant progression of the severity of aortic stenosis with peak velocity across the aortic valve measured greater than 4 m/s  corresponding to peak and mean transvalvular gradients of 61 and 40 mm mercury respectively. The patient subsequently underwent left and right heart catheterization which revealed the presence of multivessel coronary artery disease including 100% chronic total occlusion of the right coronary artery with left to right collateral filling of the terminal branches of the right coronary system. There was moderate nonobstructive disease in the left coronary system with exception of 80-90% focal ostial stenosis of a small diagonal branch. Pulmonary artery pressures were normal. Transvalvular gradient across the aortic valve was not assessed at catheterization. The patient was referred for possible elective surgical intervention. He was originally seen in consultation on 06/17/2013 and he returns for followup today with tentatively plans to proceed with elective aortic valve replacement and coronary artery bypass grafting later this week. Over the last 2 weeks he reports no new problems or complaints. He has had a few dizzy spells without syncope. He has not had increased symptoms of chest pain or shortness of breath. The remainder of his review of systems is unchanged from previously.  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 He states that he had been reasonably active for most of his life although he admits that he has been less active over the past 2 years, with considerable progression of decreased activity over the past few months. He describes a long history of intermittent dizzy spells without syncope. These or not necessarily related to physical activity or position. He thinks his dizzy spells may be worse when his blood pressure is running high rather than low. He has had some intermittent exertional chest tightness and shortness of breath. He has significantly decreased energy with progressive  fatigue. He denies any history of PND, orthopnea or lower extremity  edema. Last week while walking to the mailbox he developed somewhat acute onset of shortness breath and dizziness which prompted him to present to the emergency department. Electrocardiogram show unrevealing and he ruled out for acute myocardial infarction. His loop recorder was interrogated and did not reveal any signs of arrhythmia. His symptoms resolved very quickly. The patient and his studies were fully evaluated by Dr. Roxy Manns and he was admitted this hospitalization for the recommended procedures.   Past Medical History   Diagnosis  Date   .  HYPERLIPIDEMIA    .  HYPERTENSION    .  AORTIC STENOSIS    .  Former smoker    .  DYSPEPSIA    .  Palpitations      a. Event monitor (5/13) with occasional PVCs, PACs but no significant arrhythmia.   Marland Kitchen  CAD (coronary artery disease)    .  GERD (gastroesophageal reflux disease)    .  Trigeminal neuropathy    .  Arthritis      BACK   .  ED (erectile dysfunction)    .  CVA (cerebral infarction)      a. 3/14 right parietal infarct. Carotid US 3/14 showed no significant disease. He has an implanted loop recorder to look for atrial fibrillation.   .  Vertigo    .  Sinus bradycardia      a. 2013 - HR 40's in the office. BB stopped.   Marland Kitchen  History of kidney stones     Past Surgical History   Procedure  Laterality  Date   .  Cataract extraction     .  Lithotripsy     .  Mouth surgery     .  Inguinal hernia repair  Right  01/15/2013     Procedure: HERNIA REPAIR INGUINAL ADULT; Surgeon: Harl Bowie, MD; Location: [facility]; Service: General; Laterality: Right;   .  Insertion of mesh  Right  01/15/2013     Procedure: INSERTION OF MESH; Surgeon: Harl Bowie, MD; Location: [facility]; Service: General; Laterality: Right;   .  Loop recorder implant   04/15/2013     MDT LinQ implanred by Dr Lovena Le for cryptogenic stroke    Family History   Problem  Relation  Age of Onset   .  Diabetes     .  Hypertension       .  Lung cancer     .  Cancer  Sister      breast    Social History  History   Substance Use Topics   .  Smoking status:  Former Smoker -- 1.00 packs/day for 30 years     Types:  Cigarettes     Quit date:  08/29/2009   .  Smokeless tobacco:  Never Used   .  Alcohol Use:  No    Prior to Admission medications   Medication  Sig  Start Date  End Date  Taking?  Authorizing Provider   acetaminophen (TYLENOL) 500 MG tablet  Take 1,000 mg by mouth every 6 (six) hours as needed for mild pain.    Yes  Historical Provider, MD   amLODipine (NORVASC) 10 MG tablet  Take 10 mg by mouth daily.    Yes  Historical Provider, MD   Multiple Vitamin (MULTIVITAMIN WITH MINERALS) TABS tablet  Take 1 tablet by mouth daily.    Yes  Historical Provider, MD   omeprazole (PRILOSEC) 20 MG capsule  Take 20 mg by mouth daily as needed (for acid reduction).    Yes  Historical Provider, MD   Polyethyl Glycol-Propyl Glycol (SYSTANE OP)  Place 1 drop into both eyes daily as needed (for dry eyes).    Yes  Historical Provider, MD   rosuvastatin (CRESTOR) 10 MG tablet  Take 10 mg by mouth daily.    Yes  Historical Provider, MD   Tamsulosin HCl (FLOMAX) 0.4 MG CAPS  Take 0.4 mg by mouth at bedtime.    Yes  Historical Provider, MD    Allergies   Allergen  Reactions   .  Sudafed [Pseudoephedrine Hcl]  Palpitations   .  Cardizem [Diltiazem Hcl]  Rash     Hospital Course:   The patient was admitted and taken to the operating room on 07/11/2013 at which time he underwent the following procedure:  CARDIOTHORACIC SURGERY OPERATIVE NOTE  Date of Procedure: 07/11/2013  Preoperative Diagnosis:  Severe Aortic Stenosis  Severe Single-vessel Coronary Artery Disease Postoperative Diagnosis: Same  Procedure:  Aortic Valve Replacement Edwards Magna Ease Pericardial Tissue Valve (size 26mm, model # 3300TFX, serial # [id number])  Coronary Artery Bypass Grafting x 1 Saphenous Vein Graft to Posterior Descending Coronary Artery   Endoscopic Vein Harvest from Right Thigh  Surgeon: [name]. Roxy Manns, MD  Assistant: Ellwood Handler, PA-C  Anesthesia: Midge Minium, MD  Operative Findings:  Severe calcific aortic stenosis  Normal left ventricular systolic function  Moderate tricuspid regurgitation  Good quality saphenous vein conduit  Good quality target vessel for grafting Disposition:  The patient tolerated the procedure well and is transported to the surgical intensive care in stable condition. There are no intraoperative complications. All sponge instrument and needle counts are verified correct at completion of the operation.    Postoperative hospital course:  Overall the patient has done well. He is maintained stable hemodynamics although did require pacing for bradycardia early on. This has stabilized and he has been started on a low-dose beta blocker. He was weaned from the ventilator without difficulty. He has remained neurologically intact. All routine lines, monitors and drainage devices have been discontinued in the standard fashion. He has a very minor postoperative anemia. This is stable. Oxygen has been weaned and he maintains good saturations on room air. He had some postoperative volume overload but has improved with diuresis. Incisions are healing well without evidence of infection. He is tolerating gradually increasing activities using standard protocols. Blood sugars are under adequate control using standard protocols.  Overall status is felt to be quite stable for discharge at this time.   Recent Labs  07/13/13 0405 07/14/13 0530  NA 137 138  K 3.8 4.3  CL 103 100  CO2 26 25  GLUCOSE 102* 123*  BUN 23 25*  CALCIUM 8.0* 8.4    Recent Labs  07/13/13 0405 07/14/13 0530  WBC 11.2* 10.4  HGB 11.9* 11.9*  HCT 36.3* 36.4*  PLT 98* 100*   No results found for this basename: INR,  in the last 72 hours   Discharge Instructions:  The patient is discharged to home with extensive  instructions on wound care and progressive ambulation.  They are instructed not to drive or perform any heavy lifting until returning to see the physician in his office.  Discharge Diagnosis:  AS CAD  Secondary Diagnosis: Patient Active Problem List   Diagnosis Date Noted  . S/P aortic valve replacement with bioprosthetic valve +  CABG x1 07/11/2013  . S/P CABG x 1 07/11/2013  . Severe aortic stenosis 06/13/2013  . CAD (coronary artery disease)   . CVA (cerebral infarction)   . GERD (gastroesophageal reflux disease)   . H/O sinus bradycardia   . Chest pain 06/12/2013  . HYPERLIPIDEMIA 06/03/2009  . HYPERTENSION 06/03/2009  . DYSPEPSIA 06/03/2009   Past Medical History  Diagnosis Date  . HYPERLIPIDEMIA   . HYPERTENSION   . AORTIC STENOSIS   . Former smoker   . DYSPEPSIA   . Palpitations     a. Event monitor (5/13) with occasional PVCs, PACs but no significant arrhythmia.   Marland Kitchen CAD (coronary artery disease)   . GERD (gastroesophageal reflux disease)   . Trigeminal neuropathy   . Arthritis     BACK  . ED (erectile dysfunction)   . CVA (cerebral infarction)     a. 3/14 right parietal infarct. Carotid US 3/14 showed no significant disease. He has an implanted loop recorder to look for atrial fibrillation.   . Vertigo   . Sinus bradycardia     a. 2013 - HR 40's in the office. BB stopped.  Marland Kitchen History of kidney stones   . S/P aortic valve replacement with bioprosthetic valve + CABG x1 07/11/2013    23 mm [facility] Ease bovine pericardial tissue valve  . S/P CABG x 1 07/11/2013    SVG to PDA with EVH via right thigh        Medication List    STOP taking these medications       amLODipine 10 MG tablet  Commonly known as:  NORVASC      TAKE these medications       acetaminophen 500 MG tablet  Commonly known as:  TYLENOL  Take 1,000 mg by mouth every 6 (six) hours as needed for mild pain.     aspirin 81 MG EC tablet  Take 1 tablet (81 mg total) by mouth daily.      clopidogrel 75 MG tablet  Commonly known as:  PLAVIX  Take 1 tablet (75 mg total) by mouth daily with breakfast.     furosemide 40 MG tablet  Commonly known as:  LASIX  Take 1 tablet (40 mg total) by mouth daily.     metoprolol tartrate 12.5 mg Tabs tablet  Commonly known as:  LOPRESSOR  Take 0.5 tablets (12.5 mg total) by mouth 2 (two) times daily.     multivitamin with minerals Tabs tablet  Take 1 tablet by mouth daily.     omeprazole 20 MG capsule  Commonly known as:  PRILOSEC  Take 20 mg by mouth daily as needed (for acid reduction).     oxyCODONE 5 MG immediate release tablet  Commonly known as:  Oxy IR/ROXICODONE  Take 1-2 tablets (5-10 mg total) by mouth every 4 (four) hours as needed for severe pain.     potassium chloride SA 20 MEQ tablet  Commonly known as:  K-DUR,KLOR-CON  Take 1 tablet (20 mEq total) by mouth 2 (two) times daily.     rosuvastatin 10 MG tablet  Commonly known as:  CRESTOR  Take 10 mg by mouth daily.     SYSTANE OP  Place 1 drop into both eyes daily as needed (for dry eyes).     tamsulosin 0.4 MG Caps capsule  Commonly known as:  FLOMAX  Take 0.4 mg by mouth at bedtime.       Follow-up Information   Follow up with [facility]  H, MD. (The office will contact you to see Dr. Roxy Manns .)    Specialty:  Cardiothoracic Surgery   Contact information:   [address] [place] [address] [phone]       Follow up with [name], MD. (Arrange for appointment in 2 weeks.)    Specialty:  Cardiology   Contact information:   [address] [place] [address] [phone]     The patient has been discharged on:   1.Beta Blocker:  Yes Blue.Reese   ]                              No   [   ]                              If No, reason:  2.Ace Inhibitor/ARB: Yes [   ]                                     No  [    n]                                     If No, reason:BO too low to start at same time as beta  blocker  3.Statin:   Yes Blue.Reese   ]                  No  [   ]                  If No, reason:  4.Ecasa:  Yes  [ y  ]                  No   [   ]                  If No, reason:   Disposition: home  Patient's condition is [place], PA-C 07/15/2013  8:55 AM

## 2013-07-15 NOTE — Telephone Encounter (Signed)
New message          Pt wife would like to know if pt should take plavix with baby aspirin

## 2013-07-15 NOTE — Discharge Instructions (Signed)
Coronary Artery Bypass Grafting, Care After °Refer to this sheet in the next few weeks. These instructions provide you with information on caring for yourself after your procedure. Your health care provider may also give you more specific instructions. Your treatment has been planned according to current medical practices, but problems sometimes occur. Call your health care provider if you have any problems or questions after your procedure. °WHAT TO EXPECT AFTER THE PROCEDURE °Recovery from surgery will be different for everyone. Some people feel well after 3 or 4 weeks, while for others it takes longer. After your procedure, it is typical to have the following: °· Nausea and a lack of appetite.   °· Constipation. °· Weakness and fatigue.   °· Depression or irritability.   °· Pain or discomfort at your incision site. °HOME CARE INSTRUCTIONS °· Only take over-the-counter or prescription medicines as directed by your health care provider. Take all medicines exactly as directed. Do not stop taking medicines or start any new medicines without first checking with your health care provider.   °· Take your pulse as directed by your health care provider. °· Perform deep breathing as directed by your health care provider. If you were given a device called an incentive spirometer, use it to practice deep breathing several times a day. Support your chest with a pillow or your arms when you take deep breaths or cough. °· Keep incision areas clean, dry, and protected. Remove or change any bandages (dressings) only as directed by your health care provider. You may have skin adhesive strips over the incision areas. Do not take the strips off. They will fall off on their own. °· Check incision areas daily for any swelling, redness, or drainage. °· If incisions were made in your legs, do the following: °· Avoid crossing your legs.   °· Avoid sitting for long periods of time. Change positions every 30 minutes.   °· Elevate your legs  when you are sitting.   °· Wear compression stockings as directed by your health care provider. These stockings help keep blood clots from forming in your legs. °· Take showers once your health care provider approves. Until then, only take sponge baths. Pat incisions dry. Do not rub incisions with a washcloth or towel. Do not take tub baths or go swimming until your health care provider approves. °· Eat foods that are high in fiber, such as raw fruits and vegetables, whole grains, beans, and nuts. Meats should be lean cut. Avoid canned, processed, and fried foods. °· Drink enough fluids to keep your urine clear or pale yellow. °· Weigh yourself every day. This helps identify if you are retaining fluid that may make your heart and lungs work harder.   °· Rest and limit activity as directed by your health care provider. You may be instructed to: °· Stop any activity at once if you have chest pain, shortness of breath, irregular heartbeats, or dizziness. Get help right away if you have any of these symptoms. °· Move around frequently for short periods or take short walks as directed by your health care provider. Increase your activities gradually. You may need physical therapy or cardiac rehabilitation to help strengthen your muscles and build your endurance. °· Avoid lifting, pushing, or pulling anything heavier than 10 lb (4.5 kg) for at least 6 weeks after surgery. °· Do not drive until your health care provider approves.  °· Ask your health care provider when you may return to work and resume sexual activity. °· Follow up with your health care provider as   directed.  SEEK MEDICAL CARE IF:  You have swelling, redness, increasing pain, or drainage at the site of an incision.   You develop a fever.   You have swelling in your ankles or legs.   You have pain in your legs.   You have weight gain of 2 or more pounds a day.  You are nauseous or vomit.  You have diarrhea. SEEK IMMEDIATE MEDICAL CARE  IF:  You have chest pain that goes to your jaw or arms.  You have shortness of breath.   You have a fast or irregular heartbeat.   You notice a "clicking" in your breastbone (sternum) when you move.   You have numbness or weakness in your arms or legs.  You feel dizzy or lightheaded.  MAKE SURE YOU:  Understand these instructions.  Will watch your condition.  Will get help right away if you are not doing well or get worse. Document Released: 09/09/2004 Document Revised: 10/23/2012 Document Reviewed: 07/30/2012 [facility] Patient Information 2014 [place]. Aortic Valve Replacement Care After Refer to this sheet in the next few weeks. These instructions provide you with information on caring for yourself after your procedure. Your caregiver may also give you specific instructions. Your treatment has been planned according to current medical practices, but problems sometimes occur. Call your caregiver if you have any problems or questions after your procedure. HOME CARE INSTRUCTIONS   Only take over-the-counter or prescription medicines as directed by your caregiver.  Take your temperature every morning for the first 7 days after surgery. Write these down. Call your caregiver if your temperature stays above 100 F (37.8 C) for more than a day.   Weigh yourself every morning for at least 7 days after surgery. Write your weight down to monitor any weight increase.  Wear elastic stockings during the day for at least 2 weeks after surgery. Use them longer if your ankles are swollen. The stockings help blood flow and help reduce swelling in the legs.  Take frequent naps or rest often throughout the day.  Avoid lifting over 10 lbs (4.5 kg) or pushing or pulling things with your arms for 6 8 weeks or as directed by your caregiver.  Avoid driving or airplane travel for 4 6 weeks after surgery or as directed. If you are riding in a car for an extended period, stop every 1 2  hours to stretch your legs. Keep a record of your medicines and medical history with you when traveling.  Do not cross your legs.  Do not take baths for 4 6 weeks after surgery. Take showers once your caregiver approves. Pat incisions dry. Do not rub incisions with a washcloth or towel.  Avoid climbing stairs and using the handrail to pull yourself up for the first 2 3 weeks after surgery.  Return to work as directed by your caregiver.  Drink enough fluids to keep your urine clear or pale yellow.  Do not strain to have a bowel movement. Eat high-fiber foods if you become constipated. You may also take a medicine to help you have a bowel movement (laxative) as directed by your caregiver.  Resume sexual activity as directed by your caregiver. Men should not use medicines for erectile dysfunction until their doctor says it isokay.  If you had a certain type of heart condition in the past, you may need to take antibiotic medicine before having dental work or surgery. Let your dentist and caregivers know if you had one or more of  the following:  Previous endocarditis.  An artificial (prosthetic) heart valve.  Congenital heart disease. SEEK MEDICAL CARE IF:  You develop a skin rash.   Your weight is increasing each day over 2 3 days, or you have a sudden weight gain.  Your weight increases by 2 or more pounds (1 kg or more) in a single day. SEEK IMMEDIATE MEDICAL CARE IF:   You develop chest pain that is not coming from your incision.   You develop shortness of breath or have difficulty breathing.   You have a fever.   You have increased bleeding from your wounds.   You have increasing wound pain.   You have redness or swelling around your wounds  You have pus coming from your wound.   You develop lightheadedness.  MAKE SURE YOU:   Understand these directions.  Will watch your condition.  Will get help right away if you are not doing well or get  worse. Document Released: 09/08/2004 Document Revised: 02/07/2012 Document Reviewed: 12/05/2011 [facility] Patient Information 2014 [place], [place].

## 2013-07-15 NOTE — Telephone Encounter (Signed)
Patient was dc'd home today s/p AVR, CABG She states patient has not taken plavix at hospital, just 81 mg asa. Discharge instructions list both asa and plavix. She is concerned about him taking both of these together. Advised I will forward to Dr. Aundra Dubin for advice.

## 2013-07-15 NOTE — Progress Notes (Addendum)
[address]       [place] [address]             [phone]      4 Days Post-Op  Procedure(s) (LRB): AORTIC VALVE REPLACEMENT (AVR) (N/A) CORONARY ARTERY BYPASS GRAFTING (CABG) x1 (N/A) INTRAOPERATIVE TRANSESOPHAGEAL ECHOCARDIOGRAM (N/A) Subjective: conts to progress nicely  Objective  Telemetry sinus in 60's with pvc's  Temp:  [97.5 F (36.4 C)-98.1 F (36.7 C)] 98.1 F (36.7 C) (05/12 0500) Pulse Rate:  [59-77] 72 (05/12 0500) Resp:  [17-20] 17 (05/12 0500) BP: (109-150)/(59-75) 134/74 mmHg (05/12 0500) SpO2:  [91 %-95 %] 94 % (05/12 0500) Weight:  [203 lb 11.3 oz (92.4 kg)] 203 lb 11.3 oz (92.4 kg) (05/12 0500)   Intake/Output Summary (Last 24 hours) at 07/15/13 0728 Last data filed at 07/15/13 0541  Gross per 24 hour  Intake    360 ml  Output   2150 ml  Net  -1790 ml       General appearance: alert, cooperative and no distress Heart: regular rate and rhythm and soft systolic murmur Lungs: dim in left base Abdomen: benign Extremities: minor edema Wound: incis healing well,erethematous rash bilat groins  Lab Results:  Recent Labs  07/12/13 1700  07/13/13 0405 07/14/13 0530  NA  --   < > 137 138  K  --   < > 3.8 4.3  CL  --   < > 103 100  CO2  --   --  26 25  GLUCOSE  --   < > 102* 123*  BUN  --   < > 23 25*  CREATININE 0.76  < > 0.74 0.80  CALCIUM  --   --  8.0* 8.4  MG 2.1  --   --   --   < > = values in this interval not displayed. No results found for this basename: AST, ALT, ALKPHOS, BILITOT, PROT, ALBUMIN,  in the last 72 hours No results found for this basename: LIPASE, AMYLASE,  in the last 72 hours  Recent Labs  07/13/13 0405 07/14/13 0530  WBC 11.2* 10.4  HGB 11.9* 11.9*  HCT 36.3* 36.4*  MCV 89.9 90.3  PLT 98* 100*   No results found for this basename: CKTOTAL, CKMB, TROPONINI,  in the last 72 hours No components found with this basename: POCBNP,  No results found for this basename: DDIMER,  in the  last 72 hours No results found for this basename: HGBA1C,  in the last 72 hours No results found for this basename: CHOL, HDL, LDLCALC, TRIG, CHOLHDL,  in the last 72 hours No results found for this basename: TSH, T4TOTAL, FREET3, T3FREE, THYROIDAB,  in the last 72 hours No results found for this basename: VITAMINB12, FOLATE, FERRITIN, TIBC, IRON, RETICCTPCT,  in the last 72 hours  Medications: Scheduled . aspirin EC  81 mg Oral Daily  . atorvastatin  20 mg Oral q1800  . clopidogrel  75 mg Oral Q breakfast  . furosemide  40 mg Oral Daily  . pantoprazole  40 mg Oral QAC breakfast  . potassium chloride  20 mEq Oral BID  . sodium chloride  3 mL Intravenous Q12H  . tamsulosin  0.4 mg Oral QHS     Radiology/Studies:  No results found.  INR: Will add last result for INR, ABG once components are confirmed Will add last 4 CBG results once components are confirmed  Assessment/Plan: S/P Procedure(s) (LRB): AORTIC VALVE REPLACEMENT (AVR) (N/A)  CORONARY ARTERY BYPASS GRAFTING (CABG) x1 (N/A) INTRAOPERATIVE TRANSESOPHAGEAL ECHOCARDIOGRAM (N/A)  1 conts to do well 2 no new labs 3 start low dose beta blocker, may be able to start ace inhib soon as well 4 poss home next 24-48 hours, cont to push rehab as able/pulm toilet    LOS: 4 days    Sean Brock 5/12/20157:28 AM  I have seen and examined the patient and agree with the assessment and plan as outlined.  Sean Brock is doing quite well and wants to go home today.  Start low dose beta blocker.  Continue to hold Norvasc for now.  D/C instructions given to patient.  Sean Brock 07/15/2013 8:28 AM

## 2013-07-15 NOTE — Progress Notes (Signed)
Ed completed with wife present. Voiced understanding and requests his name be sent to [facility]. Will send. He has a RW at home. Yves Dill CES, ACSM 10:19 AM. 07/15/[id number]

## 2013-07-15 NOTE — Significant Event (Signed)
Patient has been discharged home. Reviewed discharge instructions with patient and his spouse at the bedside. Patient given prescription papers, paperwork for follow-up with [facility], and a copy of discharge instruction.  Patient and his spouse verbalized understanding of contents. Patient aware of what medications he has taken already this morning (RN wrote down meds and doses on front of his discharge instruction paperwork. All questions answered. Patient taken to transportation via wheelchair by NT Jonathon. Kimiyo Carmicheal, Therapist, sports.

## 2013-07-15 NOTE — Telephone Encounter (Signed)
OK to take both for now given valve replacement, coronary disease, and history of stroke.  May consolidate to just Plavix in a few months.

## 2013-07-15 NOTE — Care Management Note (Signed)
    Page 1 of 1   07/15/2013     10:11:43 AM CARE MANAGEMENT NOTE 07/15/2013  Patient:  Sean Brock, Sean Brock   Account Number:  [id number]  Date Initiated:  07/14/2013  Documentation initiated by:  [facility]  Subjective/Objective Assessment:   Post op CABG and AVR     Action/Plan:   Anticipated DC Date:  07/15/2013   Anticipated DC Plan:  [place]  CM consult      Choice offered to / List presented to:             Status of service:  Completed, signed off Medicare Important Message given?  YES (If response is "NO", the following Medicare IM given date fields will be blank) Date Medicare IM given:  07/15/2013 Date Additional Medicare IM given:    Discharge Disposition:  HOME/SELF CARE  Per UR Regulation:  Reviewed for med. necessity/level of care/duration of stay  If discussed at Long Length of Stay Meetings, dates discussed:    Comments:  Contact:  Hirano,Carol Spouse [phone]  5/12 1010 debbie Binh Doten rn,bsn spoke w pt and wife. pt may do outpt card reahb. pt has walker and bsc. wife is retired Therapist, sports. does not Event organiser.

## 2013-07-15 NOTE — Significant Event (Signed)
Old chest tube sutures removed without issue. Steri-strips, benzoin applied at sites. Mashanda Ishibashi, Therapist, sports

## 2013-07-16 ENCOUNTER — Ambulatory Visit (INDEPENDENT_AMBULATORY_CARE_PROVIDER_SITE_OTHER): Payer: Medicare Other | Admitting: *Deleted

## 2013-07-16 DIAGNOSIS — I6350 Cerebral infarction due to unspecified occlusion or stenosis of unspecified cerebral artery: Secondary | ICD-10-CM

## 2013-07-16 DIAGNOSIS — I639 Cerebral infarction, unspecified: Secondary | ICD-10-CM

## 2013-07-16 NOTE — Telephone Encounter (Signed)
PC to patient and his wife providing them with the response from Dr. Aundra Dubin that it is okay to take both Plavix and ASA 81 mg both for now. Dr. Aundra Dubin will consider consolidating to just Plavix in a few months. No further questions or concerns. Wife is taking note of the patient's weight daily and will call surgeon if patient has weight gain post surgery.

## 2013-07-17 LAB — PULMONARY FUNCTION TEST
DL/VA % PRED: 96 %
DL/VA: 4.66 ml/min/mmHg/L
DLCO COR: 31.08 ml/min/mmHg
DLCO cor % pred: 80 %
DLCO unc % pred: 82 %
DLCO unc: 31.6 ml/min/mmHg
FEF 25-75 PRE: 1.71 L/s
FEF2575-%PRED-PRE: 67 %
FEV1-%Pred-Pre: 77 %
FEV1-PRE: 2.76 L
FEV1FVC-%Pred-Pre: 97 %
FEV6-%Pred-Pre: 83 %
FEV6-Pre: 3.87 L
FEV6FVC-%PRED-PRE: 104 %
FVC-%Pred-Pre: 79 %
FVC-PRE: 3.93 L
PRE FEV1/FVC RATIO: 70 %
Pre FEV6/FVC Ratio: 99 %
RV % pred: 157 %
RV: 4.5 L
TLC % pred: 107 %
TLC: 8.52 L

## 2013-07-18 ENCOUNTER — Other Ambulatory Visit: Payer: Self-pay | Admitting: *Deleted

## 2013-07-18 DIAGNOSIS — R609 Edema, unspecified: Secondary | ICD-10-CM

## 2013-07-18 MED ORDER — POTASSIUM CHLORIDE CRYS ER 20 MEQ PO TBCR: 20.0000 meq | EXTENDED_RELEASE_TABLET | Freq: Two times a day (BID) | ORAL | Status: DC

## 2013-08-04 ENCOUNTER — Ambulatory Visit (INDEPENDENT_AMBULATORY_CARE_PROVIDER_SITE_OTHER): Payer: Medicare Other | Admitting: Physician Assistant

## 2013-08-04 ENCOUNTER — Encounter: Payer: Self-pay | Admitting: Physician Assistant

## 2013-08-04 VITALS — BP 142/80 | HR 51 | Ht 75.0 in | Wt 196.8 lb

## 2013-08-04 DIAGNOSIS — R079 Chest pain, unspecified: Secondary | ICD-10-CM

## 2013-08-04 DIAGNOSIS — R609 Edema, unspecified: Secondary | ICD-10-CM

## 2013-08-04 DIAGNOSIS — Z953 Presence of xenogenic heart valve: Secondary | ICD-10-CM

## 2013-08-04 DIAGNOSIS — E785 Hyperlipidemia, unspecified: Secondary | ICD-10-CM

## 2013-08-04 DIAGNOSIS — Z952 Presence of prosthetic heart valve: Secondary | ICD-10-CM

## 2013-08-04 DIAGNOSIS — I6350 Cerebral infarction due to unspecified occlusion or stenosis of unspecified cerebral artery: Secondary | ICD-10-CM

## 2013-08-04 DIAGNOSIS — I6529 Occlusion and stenosis of unspecified carotid artery: Secondary | ICD-10-CM

## 2013-08-04 DIAGNOSIS — I350 Nonrheumatic aortic (valve) stenosis: Secondary | ICD-10-CM

## 2013-08-04 DIAGNOSIS — I359 Nonrheumatic aortic valve disorder, unspecified: Secondary | ICD-10-CM

## 2013-08-04 DIAGNOSIS — I639 Cerebral infarction, unspecified: Secondary | ICD-10-CM

## 2013-08-04 DIAGNOSIS — I2510 Atherosclerotic heart disease of native coronary artery without angina pectoris: Secondary | ICD-10-CM

## 2013-08-04 MED ORDER — FUROSEMIDE 20 MG PO TABS: 20.0000 mg | ORAL_TABLET | Freq: Every day | ORAL | Status: DC

## 2013-08-04 MED ORDER — POTASSIUM CHLORIDE ER 10 MEQ PO TBCR: 10.0000 meq | EXTENDED_RELEASE_TABLET | Freq: Every day | ORAL | Status: DC

## 2013-08-04 NOTE — Patient Instructions (Signed)
Your physician has recommended you make the following change in your medication:  START LASIX ( 20 MG) DAILY FOR TWO DAYS THEN JUST AS NEEDED FOR SWELLING START POTASSIUM (10 MG ) FOR TWO DAYS THEN JUST AS NEEDED FOR SWELLING  Your physician has requested that you have an echocardiogram. Echocardiography is a painless test that uses sound waves to create images of your heart. It provides your doctor with information about the size and shape of your heart and how well your heart's chambers and valves are working. This procedure takes approximately one hour. There are no restrictions for this procedure.   Your physician has requested that you have a carotid duplex IN ONE YEAR. This test is an ultrasound of the carotid arteries in your neck. It looks at blood flow through these arteries that supply the brain with blood. Allow one hour for this exam. There are no restrictions or special instructions.   Your physician recommends that you KEEP YOUR schedule a follow-up appointment WITH DR. Aundra Dubin ON AUGUST 21ST AT 3:45PM

## 2013-08-04 NOTE — Progress Notes (Signed)
Cardiology Office Note   Date:  08/04/2013   ID:  Sean Brock, DOB 10/25/1934, MRN [id number]  PCP:  Sean Huh, MD  Cardiologist:  Dr. Loralie Brock   Electrophysiologist:  Dr. Cristopher Brock    History of Present Illness: Sean Brock is a 79 y.o. male history of aortic stenosis, HTN, right parietal infarct in 05/2012, chronic dizziness. He is status post ILR to look for atrial fibrillation as a cause of his stroke. He saw Dr. Aundra Brock in 05/2013 with decreased exercise tolerance. Echocardiogram demonstrated severe aortic stenosis. He underwent right and left heart catheterization. He was noted to have a chronically occluded RCA and 80-90% ostial D1.  He was referred to Dr. Roxy Brock for aortic valve replacement. He was admitted 5/8-5/12. He underwent aortic valve replacement with a bioprosthetic valve and CABG x1 with an SVG-PDA. Postoperative course was fairly uneventful. He remained in NSR.  He continues to have dizzy spells. These are not quite like his prior vertigo. However, they're consistent with his previous history without significant change. His chest is still somewhat sore. He denies syncope or near-syncope. He does sleep on an incline secondary to head congestion. He denies PND. His weight has decreased since discharge. He has noted some increasing LE edema. He denies fever or cough.   Studies:  - LHC (06/2013):  Dist LM 30%, ostial LAD 40-50%, prox LAD 30%, ostial D1 80-90%, prox CFX 40%, RCA occluded with L-R collats.  - Echo (05/26/13):  Mod LVH, vigorous LVF, EF 65-70%, no RWMA, Gr 1 DD, severe AS (mean 40 mmHg),   - Carotid US (07/2013):  R 1-39%; L 40-59%   Recent Labs: 05/26/2013: HDL Cholesterol by NMR 37.60*; LDL (calc) 51  06/12/2013: TSH 2.320  07/07/2013: ALT 12  07/14/2013: Creatinine 0.80; Hemoglobin 11.9*; Potassium 4.3   Wt Readings from Last 3 Encounters:  08/04/13 196 lb 12.8 oz (89.268 kg)  07/15/13 203 lb 11.3 oz (92.4 kg)  07/15/13 203 lb 11.3 oz (92.4  kg)      Past Medical History  Diagnosis Date  . HYPERLIPIDEMIA   . HYPERTENSION   . AORTIC STENOSIS     a. Echo (05/26/13):  Mod LVH, vigorous LVF, EF 65-70%, no RWMA, Gr 1 DD, severe AS (mean 40 mmHg) => s/p bioprosthetic AVR 07/2013  . Former smoker   . DYSPEPSIA   . Palpitations     a. Event monitor (5/13) with occasional PVCs, PACs but no significant arrhythmia.   Marland Kitchen GERD (gastroesophageal reflux disease)   . Trigeminal neuropathy   . Arthritis     BACK  . ED (erectile dysfunction)   . CVA (cerebral infarction)     a. 3/14 right parietal infarct. Carotid US 3/14 showed no significant disease. He has an implanted loop recorder to look for atrial fibrillation.   . Vertigo   . Sinus bradycardia     a. 2013 - HR 40's in the office. BB stopped.  Marland Kitchen History of kidney stones   . S/P aortic valve replacement with bioprosthetic valve + CABG x1 07/11/2013    23 mm [facility] Ease bovine pericardial tissue valve  . S/P CABG x 1 07/11/2013    SVG to PDA with EVH via right thigh  . CAD (coronary artery disease)     a. Lexiscan myoview (5/13) with EF 70%, no ischemia or infarction;   b. LHC (06/2013):  Dist LM 30%, ostial LAD 40-50%, prox LAD 30%, ostial D1 80-90%, prox CFX 40%, RCA occluded  with L-R collats => CABG (S-PDA at time of AVR)    Current Outpatient Prescriptions  Medication Sig Dispense Refill  . acetaminophen (TYLENOL) 500 MG tablet Take 1,000 mg by mouth every 6 (six) hours as needed for mild pain.      Marland Kitchen aspirin EC 81 MG EC tablet Take 1 tablet (81 mg total) by mouth daily.      . clopidogrel (PLAVIX) 75 MG tablet Take 1 tablet (75 mg total) by mouth daily with breakfast.  30 tablet  1  . metoprolol tartrate (LOPRESSOR) 12.5 mg TABS tablet Take 0.5 tablets (12.5 mg total) by mouth 2 (two) times daily.  60 each  1  . omeprazole (PRILOSEC) 20 MG capsule Take 20 mg by mouth daily as needed (for acid reduction).       Sean Brock Glycol-Propyl Glycol (SYSTANE OP) Place 1 drop into  both eyes daily as needed (for dry eyes).       . rosuvastatin (CRESTOR) 10 MG tablet Take 10 mg by mouth daily.      . Tamsulosin HCl (FLOMAX) 0.4 MG CAPS Take 0.4 mg by mouth at bedtime.       . Multiple Vitamin (MULTIVITAMIN WITH MINERALS) TABS tablet Take 1 tablet by mouth daily.       No current facility-administered medications for this visit.    Allergies:   Sudafed and Cardizem   Social History:  The patient  reports that he quit smoking about 3 years ago. His smoking use included Cigarettes. He has a 30 pack-year smoking history. He has never used smokeless tobacco. He reports that he does not drink alcohol or use illicit drugs.   Family History:  The patient's family history includes Cancer in his sister; Diabetes in an other family member; Hypertension in an other family member; Lung cancer in an other family member.   ROS:  Please see the history of present illness.   He has occasional epistaxis.   All other systems reviewed and negative.   PHYSICAL EXAM: VS:  BP 142/80  Pulse 51  Ht 6\' 3"  (1.905 m)  Wt 196 lb 12.8 oz (89.268 kg)  BMI 24.60 kg/m2 Well nourished, well developed, in no acute distress HEENT: normal Neck: no JVD Cardiac:  normal S1, S2; RRR; 6-6/5 systolic murmur at RUSB Lungs:  clear to auscultation bilaterally, no wheezing, rhonchi or rales Abd: soft, nontender, no hepatomegaly Ext: trace to 1+ bilateral ankle edema Skin: warm and dry Neuro:  CNs 2-12 intact, no focal abnormalities noted  EKG:  Sinus brady, HR 51, normal axis, NSSTTW changes     ASSESSMENT AND PLAN:  1. Severe aortic stenosis, S/P aortic valve replacement with bioprosthetic valve + CABG x1:  He is doing well after recent AVR + CABG.  He is ready to start Cardiac Rehab at [facility].  We discussed the importance of SBE prophylaxis.  Obtain follow up echo.   2. CAD (coronary artery disease):  Continue ASA, beta blocker and statin. 3. Edema:  He has some residual volume overload. I will  give him Lasix 20 mg and K+ 10 mEq QD prn for edema. 4. Carotid stenosis:  Continue ASA, statin.  Repeat carotid US in 1 year.  5. HYPERLIPIDEMIA:  Continue statin. 6. CVA (cerebral infarction):  Continue ASA, Plavix, statin.  He is s/p ILR.  F/u with EP as indicated. 7. Disposition:  F/u with Dr. Loralie Brock in 2 mos.    Signed, Richardson Dopp, PA-C, MHS 08/04/2013 11:52 AM  [facility] [place], [place], [place]  [address] Phone: [phone]; Fax: [phone]

## 2013-08-14 ENCOUNTER — Other Ambulatory Visit: Payer: Self-pay | Admitting: Thoracic Surgery (Cardiothoracic Vascular Surgery)

## 2013-08-14 DIAGNOSIS — I359 Nonrheumatic aortic valve disorder, unspecified: Secondary | ICD-10-CM

## 2013-08-18 ENCOUNTER — Ambulatory Visit (INDEPENDENT_AMBULATORY_CARE_PROVIDER_SITE_OTHER): Payer: Self-pay | Admitting: Surgical

## 2013-08-18 ENCOUNTER — Ambulatory Visit (INDEPENDENT_AMBULATORY_CARE_PROVIDER_SITE_OTHER): Payer: Medicare Other | Admitting: *Deleted

## 2013-08-18 ENCOUNTER — Encounter: Payer: Self-pay | Admitting: Internal Medicine

## 2013-08-18 ENCOUNTER — Ambulatory Visit
Admission: RE | Admit: 2013-08-18 | Discharge: 2013-08-18 | Disposition: A | Discharge status: Home / Self Care | Payer: Medicare Other | Source: Ambulatory Visit | Attending: Thoracic Surgery (Cardiothoracic Vascular Surgery) | Admitting: Thoracic Surgery (Cardiothoracic Vascular Surgery)

## 2013-08-18 VITALS — BP 100/63 | HR 52 | Resp 18 | Ht 75.0 in | Wt 192.0 lb

## 2013-08-18 DIAGNOSIS — I359 Nonrheumatic aortic valve disorder, unspecified: Secondary | ICD-10-CM

## 2013-08-18 DIAGNOSIS — I6350 Cerebral infarction due to unspecified occlusion or stenosis of unspecified cerebral artery: Secondary | ICD-10-CM

## 2013-08-18 DIAGNOSIS — I2510 Atherosclerotic heart disease of native coronary artery without angina pectoris: Secondary | ICD-10-CM

## 2013-08-18 DIAGNOSIS — I639 Cerebral infarction, unspecified: Secondary | ICD-10-CM

## 2013-08-18 NOTE — Patient Instructions (Signed)
Patient given verbal instructions regarding activity progression. 

## 2013-08-18 NOTE — Progress Notes (Signed)
[address]       [place] [address]             [phone]                  [place] Medical Record #[id number] Date of Birth: Sep 03, 1934  Referring JK:DTOIZT, Elby Showers, MD Primary Cardiology: Primary Care:EHINGER,ROBERT R, MD  Chief Complaint:  Follow Up Visit   History of Present Illness:    80 year old male status post aortic valve replacement with magna ease tissue valve and coronary artery bypass grafting x1 for severe aortic stenosis and single-vessel coronary artery disease. Overall the patient feels as though he is doing quite well. He is scheduled to start cardiac rehabilitation soon. He continues to have some symptoms of dizziness. He states he is getting further evaluation for this through his primary doctor. This apparently includes a physical therapy evaluation. He is using minimal Tylenol for pain. He denies fevers, chills or any other constitutional symptoms. He denies shortness of breath.         Zubrod Score: At the time of surgery this patient's most appropriate activity status/level should be described as: []     0    Normal activity, no symptoms []     1    Restricted in physical strenuous activity but ambulatory, able to do out light work []     2    Ambulatory and capable of self care, unable to do work activities, up and about                 >50 % of waking hours                                                                                   []     3    Only limited self care, in bed greater than 50% of waking hours []     4    Completely disabled, no self care, confined to bed or chair []     5    Moribund  History  Smoking status  . Former Smoker -- 1.00 packs/day for 30 years  . Types: Cigarettes  . Quit date: 08/29/2009  Smokeless tobacco  . Never Used       Allergies  Allergen Reactions  . Sudafed [Pseudoephedrine Hcl] Palpitations  . Cardizem [Diltiazem Hcl] Rash    Current Outpatient  Prescriptions  Medication Sig Dispense Refill  . acetaminophen (TYLENOL) 500 MG tablet Take 1,000 mg by mouth every 6 (six) hours as needed for mild pain.      Marland Kitchen aspirin EC 81 MG EC tablet Take 1 tablet (81 mg total) by mouth daily.      . clopidogrel (PLAVIX) 75 MG tablet Take 1 tablet (75 mg total) by mouth daily with breakfast.  30 tablet  1  . metoprolol tartrate (LOPRESSOR) 12.5 mg TABS tablet Take 0.5 tablets (12.5 mg total) by mouth 2 (two) times daily.  60 each  1  . Multiple Vitamin (MULTIVITAMIN WITH MINERALS) TABS tablet Take 1 tablet by mouth daily.      Marland Kitchen omeprazole (PRILOSEC) 20 MG capsule Take 20  mg by mouth daily as needed (for acid reduction).       Vladimir Faster Glycol-Propyl Glycol (SYSTANE OP) Place 1 drop into both eyes daily as needed (for dry eyes).       . rosuvastatin (CRESTOR) 10 MG tablet Take 10 mg by mouth daily.      . Tamsulosin HCl (FLOMAX) 0.4 MG CAPS Take 0.4 mg by mouth at bedtime.        No current facility-administered medications for this visit.       Physical Exam: BP 100/63  Pulse 52  Resp 18  Ht 6\' 3"  (1.905 m)  Wt 192 lb (87.091 kg)  BMI 24.00 kg/m2  SpO2 97%  General appearance: alert, cooperative and no distress Heart: regular rate and rhythm and 2/6 systolic murmur Lungs: clear to auscultation bilaterally Abdomen: Benign exam Extremities: No edema Wound: Incisions healing well without evidence of infection.  Diagnostic Studies & Laboratory data:         Recent Radiology Findings: Dg Chest 2 View  08/18/2013   CLINICAL DATA:  Shortness of breath. Dizziness. Aortic valve repair 4 weeks ago.  EXAM: CHEST  2 VIEW  COMPARISON:  Multiple exams, including 07/13/2013  FINDINGS: Aortic valve prosthesis unchanged in position and orientation. Prior CABG. Small electronic device projects over the left chest, likely a loop recorder.  No edema cardiothoracic index 47%, within normal limits. Degenerative glenohumeral arthropathy bilaterally. Aortic  arch atherosclerotic calcification. No pleural effusion. Prior left basilar airspace opacity has cleared.  IMPRESSION: 1. Clearing of prior left base atelectasis. Heart size within normal limits ; no edema. A cause for dizziness and shortness of breath is not observed. 2. Thoracic spondylosis. 3. Prior [facility] Ease Pericardial Tissue aortic valve replacement. Prior CABG.   Electronically Signed   By: Sherryl Barters M.D.   On: 08/18/2013 13:54      Recent Labs: Lab Results  Component Value Date   WBC 10.4 07/14/2013   HGB 11.9* 07/14/2013   HCT 36.4* 07/14/2013   PLT 100* 07/14/2013   GLUCOSE 123* 07/14/2013   CHOL 113 05/26/2013   TRIG 123.0 05/26/2013   HDL 37.60* 05/26/2013   LDLCALC 51 05/26/2013   ALT 12 07/07/2013   AST 18 07/07/2013   NA 138 07/14/2013   K 4.3 07/14/2013   CL 100 07/14/2013   CREATININE 0.80 07/14/2013   BUN 25* 07/14/2013   CO2 25 07/14/2013   TSH 2.320 06/12/2013   INR 1.40 07/11/2013   HGBA1C 5.6 07/07/2013      Assessment / Plan:  Overall the patient continues to do well. He will continue his rehabilitation in a formal cardiac rehabilitation setting. We discussed driving, and as he is continuing to be evaluated for dizziness, although he is probably ready from a surgical point of view to start, I told him to not drive until his dizziness valuation is complete. We also discussed lifting restrictions and activity progression. We will see the patient again on a when necessary basis for any surgically related issues or at request.          Tujuana Kilmartin E 08/18/2013 2:10 PM

## 2013-08-20 ENCOUNTER — Telehealth: Payer: Self-pay | Admitting: Cardiology

## 2013-08-20 NOTE — Telephone Encounter (Signed)
Continue ASA + Plavix for now.

## 2013-08-20 NOTE — Progress Notes (Signed)
Loop recorder 

## 2013-08-20 NOTE — Telephone Encounter (Signed)
New Prob    Has some questions regarding some of pts current medications. Please call.

## 2013-08-20 NOTE — Telephone Encounter (Signed)
Pts wife calling to see how long Husband will be on both ASA and Plavix combined for? Wife states she was told by pts cardiothoracic surgeon that eventually pt will be tapered off ASA and remain on Plavix only.  Told wife after reviewing last OV note with Richardson Dopp P.A., pt is to remain on ASA and Plavix.  Informed wife that I will route this message to Richardson Dopp PA and Dr Aundra Dubin for further review, and if they have any other recommendations then someone from our office will notify pt and wife of this.  Wife verbalized understanding and agrees with this plan.

## 2013-08-21 NOTE — Telephone Encounter (Signed)
Advised pt's wife to have husband continue ASA and Plavix for now.  She verbalized understanding and agreeable to plan.

## 2013-08-25 ENCOUNTER — Ambulatory Visit (HOSPITAL_COMMUNITY): Payer: Medicare Other | Attending: Cardiology | Admitting: Radiology

## 2013-08-25 DIAGNOSIS — Z954 Presence of other heart-valve replacement: Secondary | ICD-10-CM | POA: Insufficient documentation

## 2013-08-25 DIAGNOSIS — R609 Edema, unspecified: Secondary | ICD-10-CM

## 2013-08-25 DIAGNOSIS — I359 Nonrheumatic aortic valve disorder, unspecified: Secondary | ICD-10-CM | POA: Insufficient documentation

## 2013-08-25 DIAGNOSIS — I079 Rheumatic tricuspid valve disease, unspecified: Secondary | ICD-10-CM | POA: Insufficient documentation

## 2013-08-25 DIAGNOSIS — I6529 Occlusion and stenosis of unspecified carotid artery: Secondary | ICD-10-CM

## 2013-08-25 NOTE — Progress Notes (Signed)
Echocardiogram performed.  

## 2013-08-26 LAB — MDC_IDC_ENUM_SESS_TYPE_REMOTE: MDC IDC SESS DTM: 20150605040500

## 2013-08-28 ENCOUNTER — Encounter: Payer: Self-pay | Admitting: Physician Assistant

## 2013-08-28 NOTE — Progress Notes (Signed)
Quick Note:  Patient notified of ECHO results and comments from Richardson Dopp, [place]. Patient verbalized agreement with current treatment plan. ______

## 2013-09-17 ENCOUNTER — Ambulatory Visit: Payer: Medicare Other | Admitting: *Deleted

## 2013-09-18 ENCOUNTER — Encounter: Payer: Self-pay | Admitting: Internal Medicine

## 2013-09-18 LAB — MDC_IDC_ENUM_SESS_TYPE_REMOTE

## 2013-10-17 ENCOUNTER — Ambulatory Visit (INDEPENDENT_AMBULATORY_CARE_PROVIDER_SITE_OTHER): Payer: Medicare Other | Admitting: *Deleted

## 2013-10-17 DIAGNOSIS — I639 Cerebral infarction, unspecified: Secondary | ICD-10-CM

## 2013-10-17 DIAGNOSIS — I6350 Cerebral infarction due to unspecified occlusion or stenosis of unspecified cerebral artery: Secondary | ICD-10-CM

## 2013-10-17 LAB — MDC_IDC_ENUM_SESS_TYPE_REMOTE

## 2013-10-24 ENCOUNTER — Ambulatory Visit: Payer: Medicare Other | Admitting: Cardiology

## 2013-10-24 NOTE — Progress Notes (Signed)
Loop recorder 

## 2013-10-29 ENCOUNTER — Encounter: Payer: Self-pay | Admitting: Nurse Practitioner

## 2013-10-29 ENCOUNTER — Ambulatory Visit (INDEPENDENT_AMBULATORY_CARE_PROVIDER_SITE_OTHER): Payer: Medicare Other | Admitting: Nurse Practitioner

## 2013-10-29 VITALS — BP 140/80 | HR 51 | Ht 75.0 in | Wt 192.0 lb

## 2013-10-29 DIAGNOSIS — Z953 Presence of xenogenic heart valve: Secondary | ICD-10-CM

## 2013-10-29 DIAGNOSIS — I25810 Atherosclerosis of coronary artery bypass graft(s) without angina pectoris: Secondary | ICD-10-CM

## 2013-10-29 DIAGNOSIS — E785 Hyperlipidemia, unspecified: Secondary | ICD-10-CM

## 2013-10-29 DIAGNOSIS — Z952 Presence of prosthetic heart valve: Secondary | ICD-10-CM

## 2013-10-29 NOTE — Patient Instructions (Signed)
I think you are doing well  See Dr. Aundra Dubin in 6 months  Call the [place] office at [phone] if you have any questions, problems or concerns.

## 2013-10-29 NOTE — Progress Notes (Signed)
Florina Ou Date of Birth: December 24, 1934 Medical Record #[id number]  History of Present Illness: Mr. Robbins is seen back today for a 3 month check. Seen for Dr. Aundra Dubin. He has a history of aortic stenosis, HTN, right parietal infarct in 05/2012, chronic dizziness. He is status post ILR to look for atrial fibrillation as a cause of his stroke. He saw Dr. Aundra Dubin in 05/2013 with decreased exercise tolerance. Echocardiogram demonstrated severe aortic stenosis. He underwent right and left heart catheterization. He was noted to have a chronically occluded RCA and 80-90% ostial D1. He was referred to Dr. Roxy Manns for aortic valve replacement. He was admitted 5/8-5/12. He underwent aortic valve replacement with a bioprosthetic valve and CABG x1 with an SVG-PDA. Postoperative course was fairly uneventful. He remained in NSR.  Last seen in early June by Richardson Dopp, PA. Was doing ok post op. Continued to have dizzy spells. Little volume overload and was treated with diuretic.  Studies:  - LHC (06/2013): Dist LM 30%, ostial LAD 40-50%, prox LAD 30%, ostial D1 80-90%, prox CFX 40%, RCA occluded with L-R collats.  - Echo (05/26/13): Mod LVH, vigorous LVF, EF 65-70%, no RWMA, Gr 1 DD, severe AS (mean 40 mmHg),  - Carotid US (07/2013): R 1-39%; L 40-59%   Comes back today. Here alone. Was to see Dr. Aundra Dubin in 2 months. He is doing ok. No chest pain. Not short of breath. BP is pretty good at home. His readings are reviewed. Seeing his PCP tomorrow. Labs checked by PCP. Has only had one dizzy spell since he was last here - it happened after a long day at his brother's memorial. He has not passed out. No palpitations. He is pretty happy with how he is doing.    Current Outpatient Prescriptions  Medication Sig Dispense Refill  . acetaminophen (TYLENOL) 500 MG tablet Take 1,000 mg by mouth every 6 (six) hours as needed for mild pain.      Marland Kitchen aspirin EC 81 MG EC tablet Take 1 tablet (81 mg total) by mouth daily.      .  carvedilol (COREG) 3.125 MG tablet Take 3.125 mg by mouth 2 (two) times daily with a meal.      . clopidogrel (PLAVIX) 75 MG tablet Take 1 tablet (75 mg total) by mouth daily with breakfast.  30 tablet  1  . Multiple Vitamin (MULTIVITAMIN WITH MINERALS) TABS tablet Take 1 tablet by mouth daily.      Marland Kitchen omeprazole (PRILOSEC) 20 MG capsule Take 20 mg by mouth daily as needed (for acid reduction).       Vladimir Faster Glycol-Propyl Glycol (SYSTANE OP) Place 1 drop into both eyes daily as needed (for dry eyes).       . rosuvastatin (CRESTOR) 10 MG tablet Take 10 mg by mouth daily.      . Tamsulosin HCl (FLOMAX) 0.4 MG CAPS Take 0.4 mg by mouth at bedtime.        No current facility-administered medications for this visit.    Allergies  Allergen Reactions  . Sudafed [Pseudoephedrine Hcl] Palpitations  . Cardizem [Diltiazem Hcl] Rash    Past Medical History  Diagnosis Date  . HYPERLIPIDEMIA   . HYPERTENSION   . AORTIC STENOSIS     a. Echo (05/26/13):  Mod LVH, vigorous LVF, EF 65-70%, no RWMA, Gr 1 DD, severe AS (mean 40 mmHg) => s/p bioprosthetic AVR 07/2013  . Former smoker   . DYSPEPSIA   . Palpitations  a. Event monitor (5/13) with occasional PVCs, PACs but no significant arrhythmia.   Marland Kitchen GERD (gastroesophageal reflux disease)   . Trigeminal neuropathy   . Arthritis     BACK  . ED (erectile dysfunction)   . CVA (cerebral infarction)     a. 3/14 right parietal infarct. Carotid US 3/14 showed no significant disease. He has an implanted loop recorder to look for atrial fibrillation.   . Vertigo   . Sinus bradycardia     a. 2013 - HR 40's in the office. BB stopped.  Marland Kitchen History of kidney stones   . S/P aortic valve replacement with bioprosthetic valve + CABG x1 07/11/2013    23 mm [facility] Ease bovine pericardial tissue valve;  Echo (08/2013):  EF 55-60%, no RWMA, AVR ok, Asc Ao 40 mm (mildly dilated), mild LAE, mild RVE, lipomatous hypertrophy, mod TR, PASP 36 mmHg  . S/P CABG x 1  07/11/2013    SVG to PDA with EVH via right thigh  . CAD (coronary artery disease)     a. Lexiscan myoview (5/13) with EF 70%, no ischemia or infarction;   b. LHC (06/2013):  Dist LM 30%, ostial LAD 40-50%, prox LAD 30%, ostial D1 80-90%, prox CFX 40%, RCA occluded with L-R collats => CABG (S-PDA at time of AVR)    Past Surgical History  Procedure Laterality Date  . Cataract extraction    . Lithotripsy    . Mouth surgery    . Inguinal hernia repair Right 01/15/2013    Procedure: HERNIA REPAIR INGUINAL ADULT;  Surgeon: Harl Bowie, MD;  Location: [place];  Service: General;  Laterality: Right;  . Insertion of mesh Right 01/15/2013    Procedure: INSERTION OF MESH;  Surgeon: Harl Bowie, MD;  Location: [place];  Service: General;  Laterality: Right;  . Loop recorder implant  04/15/2013    MDT LinQ implanred by Dr Lovena Le for cryptogenic stroke  . Aortic valve replacement N/A 07/11/2013    Procedure: AORTIC VALVE REPLACEMENT (AVR);  Surgeon: [name], MD;  Location: [place];  Service: Open Heart Surgery;  Laterality: N/A;  . Coronary artery bypass graft N/A 07/11/2013    Procedure: CORONARY ARTERY BYPASS GRAFTING (CABG) x1;  Surgeon: [name], MD;  Location: [place];  Service: Open Heart Surgery;  Laterality: N/A;  . Intraoperative transesophageal echocardiogram N/A 07/11/2013    Procedure: INTRAOPERATIVE TRANSESOPHAGEAL ECHOCARDIOGRAM;  Surgeon: [name], MD;  Location: [place];  Service: Open Heart Surgery;  Laterality: N/A;    History  Smoking status  . Former Smoker -- 1.00 packs/day for 30 years  . Types: Cigarettes  . Quit date: 08/29/2009  Smokeless tobacco  . Never Used    History  Alcohol Use No    Family History  Problem Relation Age of Onset  . Diabetes    . Hypertension    . Lung cancer    . Cancer Sister     breast    Review of Systems: The review of systems is per the HPI.  All other systems were reviewed  and are negative.  Physical Exam: BP 140/80  Pulse 51  Ht 6\' 3"  (1.905 m)  Wt 192 lb (87.091 kg)  BMI 24.00 kg/m2  SpO2 97% Patient is very pleasant and in no acute distress. Skin is warm and dry. Color is normal.  HEENT is unremarkable. Normocephalic/atraumatic. PERRL. Sclera are nonicteric. Neck is supple. No masses. No JVD. Lungs  are clear. Cardiac exam shows a regular rate and rhythm. Soft outflow murmur.  Abdomen is soft. Extremities are without edema. Gait and ROM are intact. No gross neurologic deficits noted.  Wt Readings from Last 3 Encounters:  10/29/13 192 lb (87.091 kg)  08/18/13 192 lb (87.091 kg)  08/04/13 196 lb 12.8 oz (89.268 kg)    LABORATORY DATA/PROCEDURES:  Lab Results  Component Value Date   WBC 10.4 07/14/2013   HGB 11.9* 07/14/2013   HCT 36.4* 07/14/2013   PLT 100* 07/14/2013   GLUCOSE 123* 07/14/2013   CHOL 113 05/26/2013   TRIG 123.0 05/26/2013   HDL 37.60* 05/26/2013   LDLCALC 51 05/26/2013   ALT 12 07/07/2013   AST 18 07/07/2013   NA 138 07/14/2013   K 4.3 07/14/2013   CL 100 07/14/2013   CREATININE 0.80 07/14/2013   BUN 25* 07/14/2013   CO2 25 07/14/2013   TSH 2.320 06/12/2013   INR 1.40 07/11/2013   HGBA1C 5.6 07/07/2013    BNP (last 3 results) No results found for this basename: PROBNP,  in the last 8760 hours  Echo Study Conclusions  - Left ventricle: The cavity size was normal. Systolic function was normal. The estimated ejection fraction was in the range of 55% to 60%. Wall motion was normal; there were no regional wall motion abnormalities. - Aortic valve: A bioprosthesis was present and functioning normally. - Aorta: Ascending aortic diameter: 40 mm (S). - Ascending aorta: The ascending aorta was mildly dilated. - Mitral valve: Calcified annulus. There was trivial regurgitation. - Left atrium: The atrium was mildly dilated. - Right ventricle: The cavity size was mildly dilated. Wall thickness was normal. - Atrial septum: There was increased  thickness of the septum, consistent with lipomatous hypertrophy. - Tricuspid valve: There was moderate regurgitation. - Pulmonary arteries: PA peak pressure: 36 mm Hg (S).  Impressions:  - The right ventricular systolic pressure was increased consistent with mild pulmonary hypertension.    Assessment / Plan: 1. Severe aortic stenosis, S/P aortic valve replacement with bioprosthetic valve + CABG x1: He continues to do well with prior AVR + CABG.  Reminded about the importance of SBE prophylaxis. Updated echo stable.  2. CAD (coronary artery disease): Continue ASA, beta blocker and statin. He has no chest pain.   3. Edema: resolved.   4. Carotid stenosis: Continue ASA, statin. Repeat carotid US in 1 year.   5. HYPERLIPIDEMIA: Continue statin. Labs checked by PCP - seeing him tomorrow.   6. CVA (cerebral infarction): Continue ASA, Plavix, statin. He is s/p ILR. F/u with EP as indicated.  7. HTN - BP ok with satisfactory control at home.   See back in 6 months.   Patient is agreeable to this plan and will call if any problems develop in the interim.   Burtis Junes, RN, [place] [address] [place], [place]  [phone]

## 2013-11-12 LAB — MDC_IDC_ENUM_SESS_TYPE_REMOTE

## 2013-11-14 LAB — MDC_IDC_ENUM_SESS_TYPE_REMOTE
Date Time Interrogation Session: 20150605145415
MDC IDC SET ZONE DETECTION INTERVAL: 2000 ms
Zone Setting Detection Interval: 3000 ms
Zone Setting Detection Interval: 390 ms

## 2013-11-17 ENCOUNTER — Ambulatory Visit (INDEPENDENT_AMBULATORY_CARE_PROVIDER_SITE_OTHER): Payer: Medicare Other | Admitting: *Deleted

## 2013-11-17 DIAGNOSIS — I639 Cerebral infarction, unspecified: Secondary | ICD-10-CM

## 2013-11-17 DIAGNOSIS — I6350 Cerebral infarction due to unspecified occlusion or stenosis of unspecified cerebral artery: Secondary | ICD-10-CM

## 2013-11-20 NOTE — Progress Notes (Signed)
Loop recorder 

## 2013-11-24 ENCOUNTER — Encounter: Payer: Self-pay | Admitting: Thoracic Surgery (Cardiothoracic Vascular Surgery)

## 2013-11-24 ENCOUNTER — Ambulatory Visit (INDEPENDENT_AMBULATORY_CARE_PROVIDER_SITE_OTHER): Payer: Medicare Other | Admitting: Thoracic Surgery (Cardiothoracic Vascular Surgery)

## 2013-11-24 VITALS — BP 117/74 | HR 52 | Resp 16 | Ht 75.0 in | Wt 190.0 lb

## 2013-11-24 DIAGNOSIS — Z953 Presence of xenogenic heart valve: Secondary | ICD-10-CM

## 2013-11-24 DIAGNOSIS — I2510 Atherosclerotic heart disease of native coronary artery without angina pectoris: Secondary | ICD-10-CM

## 2013-11-24 DIAGNOSIS — I359 Nonrheumatic aortic valve disorder, unspecified: Secondary | ICD-10-CM

## 2013-11-24 DIAGNOSIS — Z952 Presence of prosthetic heart valve: Secondary | ICD-10-CM

## 2013-11-24 DIAGNOSIS — Z951 Presence of aortocoronary bypass graft: Secondary | ICD-10-CM

## 2013-11-24 DIAGNOSIS — Z954 Presence of other heart-valve replacement: Secondary | ICD-10-CM

## 2013-11-24 DIAGNOSIS — I350 Nonrheumatic aortic (valve) stenosis: Secondary | ICD-10-CM

## 2013-11-24 NOTE — Progress Notes (Signed)
[address]       [place] [address]             [phone]     CARDIOTHORACIC SURGERY OFFICE NOTE  Referring Provider is Larey Dresser, MD PCP is Simona Huh, MD   HPI:  Patient returns for routine followup status post aortic valve replacement using a bioprosthetic tissue valve and single-vessel coronary artery bypass grafting on 07/11/2013.  His postoperative recovery was uneventful and he was last seen here in our office on 08/18/2013. Since then he has continued to do well. Followup echocardiogram performed 08/25/2013 demonstrates normal left ventricular systolic function with normal functioning bioprosthetic tissue valve in the aortic position. He was last seen by Truitt Merle at [facility] on 10/29/2013 and he returns to our office for routine followup today. The patient states that he completed the cardiac rehabilitation program and he has also been participating in physical therapy because of his long history of problems with vertigo. He states that since his heart valve replacement he has had much less trouble with dizzy spells. He reports normal activity with no exertional shortness of breath. He no longer has any soreness in his chest. His appetite is good. Overall he is pleased with his progress.   Current Outpatient Prescriptions  Medication Sig Dispense Refill  . acetaminophen (TYLENOL) 500 MG tablet Take 1,000 mg by mouth every 6 (six) hours as needed for mild pain.      Marland Kitchen aspirin EC 81 MG EC tablet Take 1 tablet (81 mg total) by mouth daily.      . carvedilol (COREG) 3.125 MG tablet Take 3.125 mg by mouth 2 (two) times daily with a meal.      . clopidogrel (PLAVIX) 75 MG tablet Take 1 tablet (75 mg total) by mouth daily with breakfast.  30 tablet  1  . Multiple Vitamin (MULTIVITAMIN WITH MINERALS) TABS tablet Take 1 tablet by mouth daily.      Marland Kitchen omeprazole (PRILOSEC) 20 MG capsule Take 20 mg by mouth daily as needed (for acid reduction).        Vladimir Faster Glycol-Propyl Glycol (SYSTANE OP) Place 1 drop into both eyes daily as needed (for dry eyes).       . rosuvastatin (CRESTOR) 10 MG tablet Take 10 mg by mouth daily.      . Tamsulosin HCl (FLOMAX) 0.4 MG CAPS Take 0.4 mg by mouth at bedtime.        No current facility-administered medications for this visit.      Physical Exam:   BP 117/74  Pulse 52  Resp 16  Ht 6\' 3"  (1.905 m)  Wt 190 lb (86.183 kg)  BMI 23.75 kg/m2  SpO2 98%  General:  Well-appearing  Chest:   clear  CV:   Regular rate and rhythm without murmur  Incisions:  Completely healed, sternum is stable  Abdomen:  Soft and nontender  Extremities:  Warm and well-perfused  Diagnostic Tests:  Echocardiography  Patient: Sean Brock, Sean Brock MR #: [id number] Study Date: 08/25/2013 Gender: M Age: 79 Height: 190.5 cm Weight: 86.2 kg BSA: 2.14 m^2 Pt. Status: Room:  Faylene Million, Juliann Mule T ATTENDING Candee Furbish, M.D. REFERRING [name], M.D. SONOGRAPHER Cindy Hazy, RDCS PERFORMING [facility], Outpatient  cc:  ------------------------------------------------------------------- LV EF: 55% - 60%  ------------------------------------------------------------------- Indications: 424.1 Aortic valve disorders.  ------------------------------------------------------------------- History: PMH: CAD. CVA. Chest pain. History of severe Aortic Stenosis. Risk factors: Hypertension. Dyslipidemia.  -------------------------------------------------------------------  Study Conclusions  - Left ventricle: The cavity size was normal. Systolic function was normal. The estimated ejection fraction was in the range of 55% to 60%. Wall motion was normal; there were no regional wall motion abnormalities. - Aortic valve: A bioprosthesis was present and functioning normally. - Aorta: Ascending aortic diameter: 40 mm (S). - Ascending aorta: The ascending aorta was mildly dilated. -  Mitral valve: Calcified annulus. There was trivial regurgitation. - Left atrium: The atrium was mildly dilated. - Right ventricle: The cavity size was mildly dilated. Wall thickness was normal. - Atrial septum: There was increased thickness of the septum, consistent with lipomatous hypertrophy. - Tricuspid valve: There was moderate regurgitation. - Pulmonary arteries: PA peak pressure: 36 mm Hg (S).  Impressions:  - The right ventricular systolic pressure was increased consistent with mild pulmonary hypertension.  ------------------------------------------------------------------- Labs, prior tests, procedures, and surgery: Coronary artery bypass grafting (May 2015).  Valve surgery (May 2015). Aortic valve replacement. Echocardiography. M-mode, complete 2D, spectral Doppler, and color Doppler. Birthdate: Patient birthdate: [birth year]. Age: Patient is 79 yr old. Sex: Gender: male. Height: Height: 190.5 cm. Height: 75 in. Weight: Weight: 86.2 kg. Weight: 189.6 lb. Body mass index: BMI: 23.7 kg/m^2. Body surface area: BSA: 2.14 m^2. Blood pressure: 100/63 Patient status: Outpatient. Study date: Study date: 08/25/2013. Study time: 03:15 PM. Location: [facility]  -------------------------------------------------------------------  ------------------------------------------------------------------- Left ventricle: The cavity size was normal. Systolic function was normal. The estimated ejection fraction was in the range of 55% to 60%. Wall motion was normal; there were no regional wall motion abnormalities.  ------------------------------------------------------------------- Aortic valve: A bioprosthesis was present and functioning normally. Mobility was not restricted. Doppler: Transvalvular velocity was within the normal range. There was no stenosis. There was no regurgitation. Mean gradient (S): 15 mm Hg. Peak gradient (S): 26 mm  Hg.  ------------------------------------------------------------------- Aorta: Aortic root: The aortic root was normal in size. Ascending aorta: The ascending aorta was mildly dilated.  ------------------------------------------------------------------- Mitral valve: Calcified annulus. Mobility was not restricted. Doppler: Transvalvular velocity was within the normal range. There was no evidence for stenosis. There was trivial regurgitation. Peak gradient (D): 4 mm Hg.  ------------------------------------------------------------------- Left atrium: The atrium was mildly dilated.  ------------------------------------------------------------------- Atrial septum: There was increased thickness of the septum, consistent with lipomatous hypertrophy.  ------------------------------------------------------------------- Right ventricle: The cavity size was mildly dilated. Wall thickness was normal. Systolic function was normal.  ------------------------------------------------------------------- Pulmonic valve: Structurally normal valve. Cusp separation was normal. Doppler: Transvalvular velocity was within the normal range. There was no evidence for stenosis. There was no regurgitation.  ------------------------------------------------------------------- Tricuspid valve: Structurally normal valve. Doppler: Transvalvular velocity was within the normal range. There was moderate regurgitation.  ------------------------------------------------------------------- Pulmonary artery: The main pulmonary artery was normal-sized. Systolic pressure was within the normal range.  ------------------------------------------------------------------- Right atrium: The atrium was normal in size.  ------------------------------------------------------------------- Pericardium: There was no pericardial effusion.  ------------------------------------------------------------------- Systemic  veins: Inferior vena cava: The vessel was normal in size.  ------------------------------------------------------------------- Prepared and Electronically Authenticated by  Fransico Him, MD 2015-06-22T16:15:54  ------------------------------------------------------------------- Measurements  Left ventricle Value Reference LV ID, ED, PLAX chordal (H) 52.4 mm 43 - 52 LV ID, ES, PLAX chordal (N) 28.8 mm 23 - 38 LV fx shortening, PLAX chordal (N) 45 % >=29 LV PW thickness, ED 11.5 mm --------- IVS/LV PW ratio, ED (N) 0.93 <=1.3 LV e&', lateral 9.98 cm/s --------- LV E/e&', lateral 10.22 --------- LV e&', medial 7.02 cm/s --------- LV E/e&', medial 14.53 --------- LV e&', average 8.5 cm/s ---------  LV E/e&', average 12 ---------  Ventricular septum Value Reference IVS thickness, ED 10.7 mm ---------  Aortic valve Value Reference Aortic valve peak velocity, S 253 cm/s --------- Aortic valve mean velocity, S 182 cm/s --------- Aortic valve VTI, S 62.7 cm --------- Aortic mean gradient, S 15 mm Hg --------- Aortic peak gradient, S 26 mm Hg ---------  Aorta Value Reference Aortic root ID, ED 31 mm --------- Ascending aorta ID, A-P, S 40 mm ---------  Left atrium Value Reference LA ID, A-P, ES 45 mm --------- LA ID/bsa, A-P (N) 2.11 cm/m^2 <=2.2  Mitral valve Value Reference Mitral E-wave peak velocity 102 cm/s --------- Mitral A-wave peak velocity 83.1 cm/s --------- Mitral deceleration time (H) 232 ms 150 - 230 Mitral peak gradient, D 4 mm Hg --------- Mitral E/A ratio, peak 1.2 ---------  Pulmonary arteries Value Reference PA pressure, S, DP (H) 36 mm Hg <=30  Tricuspid valve Value Reference Tricuspid regurg peak velocity 250 cm/s --------- Tricuspid peak RV-RA gradient 25 mm Hg --------- Tricuspid maximal regurg velocity, 250 cm/s --------- PISA  Right ventricle Value Reference RV s&', lateral, S 11.7 cm/s ---------  Legend: (L) and (H) mark values outside  specified reference range.  (N) marks values inside specified reference range.    Impression:  Patient is doing very well approximately 4 months status post aortic valve replacement using a bioprosthetic tissue valve and single-vessel coronary artery bypass grafting.  followup echocardiogram performed 6 weeks postoperatively looks good with normal left ventricular systolic function and normal functioning bioprosthetic tissue valve in the aortic position.  Plan:  The patient will continue all current medications without change. He has been encouraged to continue to increase his activity without any particular limitations. We will plan to see him back next May for routine followup approximately one year after his original procedure.  I spent in excess of 15 minutes during the conduct of this office consultation and >50% of this time involved direct face-to-face encounter with the patient for counseling and/or coordination of their care.  [name]. Roxy Manns, MD 11/24/2013 11:12 AM

## 2013-11-24 NOTE — Patient Instructions (Signed)

## 2013-11-27 ENCOUNTER — Encounter: Payer: Self-pay | Admitting: Internal Medicine

## 2013-11-28 ENCOUNTER — Encounter: Payer: Self-pay | Admitting: Internal Medicine

## 2013-12-02 LAB — MDC_IDC_ENUM_SESS_TYPE_REMOTE

## 2013-12-15 ENCOUNTER — Encounter: Payer: Self-pay | Admitting: Internal Medicine

## 2013-12-16 ENCOUNTER — Ambulatory Visit (INDEPENDENT_AMBULATORY_CARE_PROVIDER_SITE_OTHER): Payer: Medicare Other | Admitting: *Deleted

## 2013-12-16 DIAGNOSIS — I639 Cerebral infarction, unspecified: Secondary | ICD-10-CM

## 2013-12-19 NOTE — Progress Notes (Signed)
Loop recorder 

## 2014-01-01 LAB — MDC_IDC_ENUM_SESS_TYPE_REMOTE

## 2014-01-15 ENCOUNTER — Ambulatory Visit (INDEPENDENT_AMBULATORY_CARE_PROVIDER_SITE_OTHER): Payer: Medicare Other | Admitting: *Deleted

## 2014-01-15 DIAGNOSIS — I639 Cerebral infarction, unspecified: Secondary | ICD-10-CM

## 2014-01-15 LAB — MDC_IDC_ENUM_SESS_TYPE_REMOTE

## 2014-01-21 NOTE — Progress Notes (Signed)
Loop recorder 

## 2014-01-27 ENCOUNTER — Ambulatory Visit: Payer: Medicare Other | Admitting: *Deleted

## 2014-01-27 DIAGNOSIS — I639 Cerebral infarction, unspecified: Secondary | ICD-10-CM

## 2014-02-12 ENCOUNTER — Encounter (HOSPITAL_COMMUNITY): Payer: Self-pay | Admitting: Internal Medicine

## 2014-02-12 ENCOUNTER — Encounter: Payer: Self-pay | Admitting: Internal Medicine

## 2014-02-18 NOTE — Progress Notes (Signed)
Loop recorder 

## 2014-02-20 ENCOUNTER — Telehealth: Payer: Self-pay | Admitting: Cardiology

## 2014-02-20 ENCOUNTER — Encounter: Payer: Self-pay | Admitting: Cardiology

## 2014-02-20 NOTE — Telephone Encounter (Signed)
LMOVM requesting that pt send manual transmission b/c home monitor has been disconnected.   

## 2014-03-03 ENCOUNTER — Encounter: Payer: Self-pay | Admitting: Internal Medicine

## 2014-03-17 ENCOUNTER — Ambulatory Visit (INDEPENDENT_AMBULATORY_CARE_PROVIDER_SITE_OTHER): Payer: Self-pay | Admitting: *Deleted

## 2014-03-17 DIAGNOSIS — I639 Cerebral infarction, unspecified: Secondary | ICD-10-CM

## 2014-03-17 LAB — MDC_IDC_ENUM_SESS_TYPE_REMOTE

## 2014-03-20 NOTE — Progress Notes (Signed)
Loop recorder 

## 2014-04-16 ENCOUNTER — Ambulatory Visit (INDEPENDENT_AMBULATORY_CARE_PROVIDER_SITE_OTHER): Payer: Medicare Other | Admitting: *Deleted

## 2014-04-16 DIAGNOSIS — I639 Cerebral infarction, unspecified: Secondary | ICD-10-CM

## 2014-04-16 NOTE — Progress Notes (Signed)
Loop recorder 

## 2014-04-22 ENCOUNTER — Encounter: Payer: Self-pay | Admitting: Internal Medicine

## 2014-04-23 ENCOUNTER — Encounter: Payer: Self-pay | Admitting: Internal Medicine

## 2014-04-23 ENCOUNTER — Ambulatory Visit (INDEPENDENT_AMBULATORY_CARE_PROVIDER_SITE_OTHER): Payer: Medicare Other | Admitting: Internal Medicine

## 2014-04-23 ENCOUNTER — Encounter: Payer: Self-pay | Admitting: Cardiology

## 2014-04-23 ENCOUNTER — Ambulatory Visit (INDEPENDENT_AMBULATORY_CARE_PROVIDER_SITE_OTHER): Payer: Medicare Other | Admitting: Cardiology

## 2014-04-23 VITALS — BP 132/72 | HR 63 | Ht 75.0 in | Wt 200.0 lb

## 2014-04-23 DIAGNOSIS — R0789 Other chest pain: Secondary | ICD-10-CM

## 2014-04-23 DIAGNOSIS — I10 Essential (primary) hypertension: Secondary | ICD-10-CM

## 2014-04-23 DIAGNOSIS — E785 Hyperlipidemia, unspecified: Secondary | ICD-10-CM

## 2014-04-23 DIAGNOSIS — Z952 Presence of prosthetic heart valve: Secondary | ICD-10-CM

## 2014-04-23 DIAGNOSIS — I6300 Cerebral infarction due to thrombosis of unspecified precerebral artery: Secondary | ICD-10-CM

## 2014-04-23 DIAGNOSIS — I350 Nonrheumatic aortic (valve) stenosis: Secondary | ICD-10-CM

## 2014-04-23 DIAGNOSIS — I2510 Atherosclerotic heart disease of native coronary artery without angina pectoris: Secondary | ICD-10-CM

## 2014-04-23 DIAGNOSIS — I6522 Occlusion and stenosis of left carotid artery: Secondary | ICD-10-CM

## 2014-04-23 DIAGNOSIS — Z954 Presence of other heart-valve replacement: Secondary | ICD-10-CM

## 2014-04-23 DIAGNOSIS — Z951 Presence of aortocoronary bypass graft: Secondary | ICD-10-CM

## 2014-04-23 LAB — LIPID PANEL
Cholesterol: 118 mg/dL (ref 0–200)
HDL: 31.8 mg/dL — ABNORMAL LOW (ref >39.00)
LDL Cholesterol: 48 mg/dL (ref 0–99)
NonHDL: 86.2
TRIGLYCERIDES: 191 mg/dL — AB (ref 0.0–149.0)
Total CHOL/HDL Ratio: 4
VLDL: 38.2 mg/dL (ref 0.0–40.0)

## 2014-04-23 LAB — MDC_IDC_ENUM_SESS_TYPE_INCLINIC
Date Time Interrogation Session: 20160218141512
MDC IDC SET ZONE DETECTION INTERVAL: 390 ms
Zone Setting Detection Interval: 2000 ms
Zone Setting Detection Interval: 3000 ms

## 2014-04-23 LAB — BASIC METABOLIC PANEL
BUN: 20 mg/dL (ref 6–23)
CHLORIDE: 105 meq/L (ref 96–112)
CO2: 27 mEq/L (ref 19–32)
CREATININE: 0.85 mg/dL (ref 0.40–1.50)
Calcium: 9.5 mg/dL (ref 8.4–10.5)
GFR: 92.63 mL/min (ref >60.00)
Glucose, Bld: 94 mg/dL (ref 70–99)
Potassium: 4.1 mEq/L (ref 3.5–5.1)
Sodium: 138 mEq/L (ref 135–145)

## 2014-04-23 NOTE — Patient Instructions (Signed)
Your physician recommends that you have  lab work today--lipid profile/BMET.  Your physician has requested that you have a carotid duplex. This test is an ultrasound of the carotid arteries in your neck. It looks at blood flow through these arteries that supply the brain with blood. Allow one hour for this exam. There are no restrictions or special instructions. MAY 2016  Your physician wants you to follow-up in: 6 months with Dr Aundra Dubin. (August 2016). You will receive a reminder letter in the mail two months in advance. If you don't receive a letter, please call our office to schedule the follow-up appointment.

## 2014-04-23 NOTE — Assessment & Plan Note (Signed)
His blood pressure is reasonably well controlled. No change in medical therapy.

## 2014-04-23 NOTE — Assessment & Plan Note (Signed)
He denies anginal symptoms since his surgery. Will follow.

## 2014-04-23 NOTE — Progress Notes (Signed)
Patient ID: Sean Brock, male   DOB: 03/31/1934, 80 y.o.   MRN: [id number] PCP: Dr. Marisue Humble  80 yo with history of HTN and aortic stenosis presents for cardiology followup.  He had a right parietal infarct in 3/14.  He seems to have had a complete recovery, no further significant stroke-related symptoms.  He had a loop recorder placed to monitor for atrial fibrillation, no atrial fibrillation noted so far on monitor.  He had CABG-bioprosthetic AVR with SVG-PDA in 5/15.    He is doing well overall.  No exertional chest pain or dyspnea.  He can climb steps without problems.  Occasional positional vertigo.   ECG; NSR, 1st degree AV block, PAC  Labs (3/14): LDL 48, HDL 33 Labs (11/14): K 4.1, 0.79 Labs (2/15): K 3.8, creatinine 1.0 Labs (3/15): LDL 51, HDL 38 Labs (5/15): K 4.3, creatinine 0.5  PMH: 1. HTN 2. Hyperlipidemia 3. Vertigo 4. Aortic stenosis: Echo (4/11) with EF 55-60%, mean gradient 18/peak 34.  Echo (4/13): EF 55-60%, mild to moderate AS, mean gradient 26/peak gradient 53.  Echo (3/14) with EF 60-65%, moderate AS (mean 23 mmHg), mild LVH.  Echo (3/15) with EF 65-70%, moderate LVH, severe AS with mean gradient 40 mmHg/peak 61 mmHg, AVA 0.88 cm^2. Bioprosthetic AVR in 5/15.  Post-op echo in 6/15 with EF 55-60%, normal bioprosthetic aortic valve, mild RV dilation with normal systolic function, ascending aorta 4.0 cm.   5. Palpitations: Event monitor (5/13) with occasional PVCs, PACs but no significant arrhythmia.  6. CAD: Lexiscan myoview (5/13) with EF 70%, no ischemia or infarction.  LHC (4/15) with 40-50% ostial LAD stenosis, moderate D1 with 80-90% ostial stenosis, totally occluded RCA with L=>R collaterals.  He had SVG-PDA with AVR in 5/15.   7. 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  Carotid US 3/14 showed no significant disease.  He has an implanted loop recorder to look for atrial fibrillation.  8. Right inguinal hernia repair in 11/14.  9. Carotid stenosis: Carotid dopplers  (3/08) with 65-78% LICA stenosis.   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  Married.  Nonsmoker.   FH: Brother with CABG.  Sister with aortic stenosis s/p AVR  ROS: All systems reviewed and negative except as per HPI.   Current Outpatient Prescriptions  Medication Sig Dispense Refill  . acetaminophen (TYLENOL) 500 MG tablet Take 1,000 mg by mouth every 6 (six) hours as needed for mild pain.    Marland Kitchen aspirin EC 81 MG EC tablet Take 1 tablet (81 mg total) by mouth daily.    . carvedilol (COREG) 3.125 MG tablet Take 3.125 mg by mouth 2 (two) times daily with a meal.    . clopidogrel (PLAVIX) 75 MG tablet Take 1 tablet (75 mg total) by mouth daily with breakfast. 30 tablet 1  . Multiple Vitamin (MULTIVITAMIN WITH MINERALS) TABS tablet Take 1 tablet by mouth as needed.     Marland Kitchen omeprazole (PRILOSEC) 20 MG capsule Take 20 mg by mouth daily as needed (for acid reduction).     . rosuvastatin (CRESTOR) 10 MG tablet Take 10 mg by mouth daily.    . Tamsulosin HCl (FLOMAX) 0.4 MG CAPS Take 0.4 mg by mouth at bedtime.     Vladimir Faster Glycol-Propyl Glycol (SYSTANE OP) Place 1 drop into both eyes daily as needed (for dry eyes).      No current facility-administered medications for this visit.     BP 132/72 mmHg  Pulse 63  Ht 6\' 3"  (1.905 m)  Wt 200 lb (90.719 kg)  BMI 25.00 kg/m2 General: NAD Neck: No JVD, no thyromegaly or thyroid nodule.  Lungs: Clear to auscultation bilaterally with normal respiratory effort. CV: Nondisplaced PMI.  Heart regular S1/S2, no S3/S4, 1/6 SEM RUSB.  No peripheral edema.  No carotid bruit.  Normal pedal pulses.  Abdomen: Soft, nontender, no hepatosplenomegaly, no distention.  Neurologic: Alert and oriented x 3.  Psych: Normal affect. Extremities: No clubbing or cyanosis.   Assessment/Plan:  AORTIC STENOSIS  Status post bioprosthetic AVR.  Post-op echo showed well-seated valve in 6/15.  He will need endocarditis prophylaxis  with dental work.   CAD SVG-PDA with AVR in 5/15. No chest pain.  Continue ASA 81, Plavix 75, and Crestor 10.    CVA CVA in 3/14.  He has an implanted loop recorder now to monitor for paroxysmal atrial fibrillation. So far no atrial fibrillation noted. He is on Plavix.  Hyperlipidemia Check lipids today. Would like to keep LDL < 70 with history of CVA.  Continue Crestor.  Carotid stenosis Repeat carotid dopplers in 5/16.   [name] 04/23/2014

## 2014-04-23 NOTE — Patient Instructions (Signed)
Your physician wants you to follow-up in: 12 months with Dr Knox Saliva will receive a reminder letter in the mail two months in advance. If you don't receive a letter, please call our office to schedule the follow-up appointment.  Remote monitoring is used to monitor your Pacemaker or ICD from home. This monitoring reduces the number of office visits required to check your device to one time per year. It allows [place] to keep an eye on the functioning of your device to ensure it is working properly. You are scheduled for a device check from home on 07/23/14. You may send your transmission at any time that day. If you have a wireless device, the transmission will be sent automatically. After your physician reviews your transmission, you will receive a postcard with your next transmission date.

## 2014-04-23 NOTE — Assessment & Plan Note (Signed)
He has had no recurrent symptoms. He has had no atrial fib since his device was lased checked.

## 2014-04-23 NOTE — Progress Notes (Signed)
HPI [name] returns today for followup. He is a 80 yo man with a h/o cryptogenic stroke, aortic stenosis, s/p replacement, s/p ILR insertion. In the interim he has done well with no chest pressure or syncope. No neuro changes. He denies sob with exertion.  Allergies  Allergen Reactions  . Sudafed [Pseudoephedrine Hcl] Palpitations  . Cardizem [Diltiazem Hcl] Rash     Current Outpatient Prescriptions  Medication Sig Dispense Refill  . acetaminophen (TYLENOL) 500 MG tablet Take 1,000 mg by mouth every 6 (six) hours as needed for mild pain.    Sean Brock aspirin EC 81 MG EC tablet Take 1 tablet (81 mg total) by mouth daily.    . carvedilol (COREG) 3.125 MG tablet Take 3.125 mg by mouth 2 (two) times daily with a meal.    . clopidogrel (PLAVIX) 75 MG tablet Take 1 tablet (75 mg total) by mouth daily with breakfast. 30 tablet 1  . Multiple Vitamin (MULTIVITAMIN WITH MINERALS) TABS tablet Take 1 tablet by mouth as needed.     Sean Brock omeprazole (PRILOSEC) 20 MG capsule Take 20 mg by mouth daily as needed (for acid reduction).     Sean Brock-Propyl Brock (SYSTANE OP) Place 1 drop into both eyes daily as needed (for dry eyes).     . rosuvastatin (CRESTOR) 10 MG tablet Take 10 mg by mouth daily.    . Tamsulosin HCl (FLOMAX) 0.4 MG CAPS Take 0.4 mg by mouth at bedtime.      No current facility-administered medications for this visit.     Past Medical History  Diagnosis Date  . HYPERLIPIDEMIA   . HYPERTENSION   . AORTIC STENOSIS     a. Echo (05/26/13):  Mod LVH, vigorous LVF, EF 65-70%, no RWMA, Gr 1 DD, severe AS (mean 40 mmHg) => s/p bioprosthetic AVR 07/2013  . Former smoker   . DYSPEPSIA   . Palpitations     a. Event monitor (5/13) with occasional PVCs, PACs but no significant arrhythmia.   Sean Brock GERD (gastroesophageal reflux disease)   . Trigeminal neuropathy   . Arthritis     BACK  . ED (erectile dysfunction)   . CVA (cerebral infarction)     a. 3/14 right parietal infarct.  Carotid US 3/14 showed no significant disease. He has an implanted loop recorder to look for atrial fibrillation.   . Vertigo   . Sinus bradycardia     a. 2013 - HR 40's in the office. BB stopped.  Sean Brock History of kidney stones   . S/P aortic valve replacement with bioprosthetic valve + CABG x1 07/11/2013    23 mm [facility] Ease bovine pericardial tissue valve;  Echo (08/2013):  EF 55-60%, no RWMA, AVR ok, Asc Ao 40 mm (mildly dilated), mild LAE, mild RVE, lipomatous hypertrophy, mod TR, PASP 36 mmHg  . S/P CABG x 1 07/11/2013    SVG to PDA with EVH via right thigh  . CAD (coronary artery disease)     a. Lexiscan myoview (5/13) with EF 70%, no ischemia or infarction;   b. LHC (06/2013):  Dist LM 30%, ostial LAD 40-50%, prox LAD 30%, ostial D1 80-90%, prox CFX 40%, RCA occluded with L-R collats => CABG (S-PDA at time of AVR)    ROS:   All systems reviewed and negative except as noted in the HPI.   Past Surgical History  Procedure Laterality Date  . Cataract extraction    . Lithotripsy    . Mouth  surgery    . Inguinal hernia repair Right 01/15/2013    Procedure: HERNIA REPAIR INGUINAL ADULT;  Surgeon: Harl Bowie, MD;  Location: [place];  Service: General;  Laterality: Right;  . Insertion of mesh Right 01/15/2013    Procedure: INSERTION OF MESH;  Surgeon: Harl Bowie, MD;  Location: [place];  Service: General;  Laterality: Right;  . Loop recorder implant  04/15/2013    MDT LinQ implanred by Dr Lovena Le for cryptogenic stroke  . Aortic valve replacement N/A 07/11/2013    Procedure: AORTIC VALVE REPLACEMENT (AVR);  Surgeon: [name], MD;  Location: [place];  Service: Open Heart Surgery;  Laterality: N/A;  . Coronary artery bypass graft N/A 07/11/2013    Procedure: CORONARY ARTERY BYPASS GRAFTING (CABG) x1;  Surgeon: [name], MD;  Location: [place];  Service: Open Heart Surgery;  Laterality: N/A;  . Intraoperative transesophageal  echocardiogram N/A 07/11/2013    Procedure: INTRAOPERATIVE TRANSESOPHAGEAL ECHOCARDIOGRAM;  Surgeon: [name], MD;  Location: [place];  Service: Open Heart Surgery;  Laterality: N/A;  . Loop recorder implant N/A 04/14/2013    Procedure: LOOP RECORDER IMPLANT;  Surgeon: Evans Lance, MD;  Location[facility] C[facility] LAB;  Service: Cardiovascular;  Laterality: N/A;  . Left and right heart catheterization with coronary angiogram N/A 06/06/2013    Procedure: LEFT AND RIGHT HEART CATHETERIZATION WITH CORONARY ANGIOGRAM;  Surgeon: Larey Dresser, MD;  Location: [facility] CATH LAB;  Service: Cardiovascular;  Laterality: N/A;     Family History  Problem Relation Age of Onset  . Diabetes    . Hypertension    . Lung cancer    . Cancer Sister     breast     History   Social History  . Marital Status: Married    Spouse Name: N/A  . Number of Children: N/A  . Years of Education: N/A   Occupational History  . Not on file.   Social History Main Topics  . Smoking status: Former Smoker -- 1.00 packs/day for 30 years    Types: Cigarettes    Quit date: 08/29/2009  . Smokeless tobacco: Never Used  . Alcohol Use: No  . Drug Use: No  . Sexual Activity: Not Currently   Other Topics Concern  . Not on file   Social History Narrative   He resides in [place] with his wife and son. 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  No    grandchildren. He smokes half a pack per day intermittently for the last 40    years. He drinks a glass of homemade wine mixed with diet Coke one time per    week. He denies any drugs, herbal medication, diet, or exercise program.           BP 132/72 mmHg  Pulse 63  Ht 6\' 3"  (1.905 m)  Wt 200 lb (90.719 kg)  BMI 25.00 kg/m2  Physical Exam:  Well appearing 80 yo man, NAD HEENT: Unremarkable Neck:  No JVD, no thyromegally Back:  No CVA tenderness Lungs:  Clear with no wheezes HEART:  Regular rate rhythm, no murmurs, no rubs, no  clicks Abd:  soft, positive bowel sounds, no organomegally, no rebound, no guarding Ext:  2 plus pulses, no edema, no cyanosis, no clubbing Skin:  No rashes no nodules Neuro:  CN II through XII intact, motor grossly intact  EKG -  nsr with first degreee AV block and PAC's.  DEVICE  Normal device function.  See PaceArt for details. Normal ILR function.   Assess/Plan:

## 2014-04-24 ENCOUNTER — Telehealth: Payer: Self-pay | Admitting: Cardiology

## 2014-04-24 NOTE — Telephone Encounter (Signed)
Spoke with patient about recent lab results 

## 2014-04-24 NOTE — Telephone Encounter (Signed)
New message ° ° ° ° °Returning Anne's call °

## 2014-04-27 LAB — MDC_IDC_ENUM_SESS_TYPE_REMOTE

## 2014-05-04 ENCOUNTER — Telehealth: Payer: Self-pay | Admitting: *Deleted

## 2014-05-04 ENCOUNTER — Encounter: Payer: Self-pay | Admitting: Internal Medicine

## 2014-05-04 NOTE — Telephone Encounter (Signed)
Spoke w/pt to send manuel transmission. Pt will send transmission this pm.

## 2014-05-04 NOTE — Telephone Encounter (Signed)
LMOM for pt to return call. Pt had pause episode on 2-28 and AF episode on 05-02-14 lasting 6 hours. ? Symptoms with pause episode and need to send manuel transmission to get all of AF episode to confirm AF.

## 2014-05-06 ENCOUNTER — Other Ambulatory Visit: Payer: Self-pay | Admitting: *Deleted

## 2014-05-06 ENCOUNTER — Telehealth: Payer: Self-pay | Admitting: *Deleted

## 2014-05-06 ENCOUNTER — Telehealth: Payer: Self-pay | Admitting: Internal Medicine

## 2014-05-06 DIAGNOSIS — I482 Chronic atrial fibrillation, unspecified: Secondary | ICD-10-CM

## 2014-05-06 MED ORDER — APIXABAN 5 MG PO TABS: 5.0000 mg | ORAL_TABLET | Freq: Two times a day (BID) | ORAL | Status: DC

## 2014-05-06 NOTE — Telephone Encounter (Signed)
AF episodes recorded on LINQ. GT reviewed episodes and determined AF. D/C Plavix. Continue ASA 81 mg and start Eliquis 5mg  bid per GT. Eliquis sent to [facility] at [address] in [place] by [facility]. Pt aware of changes in meds and adding Eliquis. Pt taken out of cryptogenic stroke clinic in [place].

## 2014-05-11 ENCOUNTER — Encounter: Payer: Self-pay | Admitting: Internal Medicine

## 2014-05-13 ENCOUNTER — Ambulatory Visit (HOSPITAL_COMMUNITY): Payer: Medicare Other | Attending: Cardiovascular Disease | Admitting: *Deleted

## 2014-05-13 DIAGNOSIS — Z952 Presence of prosthetic heart valve: Secondary | ICD-10-CM

## 2014-05-13 DIAGNOSIS — Z954 Presence of other heart-valve replacement: Secondary | ICD-10-CM | POA: Insufficient documentation

## 2014-05-13 DIAGNOSIS — I2510 Atherosclerotic heart disease of native coronary artery without angina pectoris: Secondary | ICD-10-CM

## 2014-05-13 DIAGNOSIS — E785 Hyperlipidemia, unspecified: Secondary | ICD-10-CM

## 2014-05-13 DIAGNOSIS — I6523 Occlusion and stenosis of bilateral carotid arteries: Secondary | ICD-10-CM

## 2014-05-13 DIAGNOSIS — I6522 Occlusion and stenosis of left carotid artery: Secondary | ICD-10-CM | POA: Diagnosis not present

## 2014-05-13 NOTE — Progress Notes (Signed)
Carotid Duplex Scan Performed 

## 2014-05-15 ENCOUNTER — Ambulatory Visit (INDEPENDENT_AMBULATORY_CARE_PROVIDER_SITE_OTHER): Payer: Medicare Other | Admitting: *Deleted

## 2014-05-15 ENCOUNTER — Encounter: Payer: Self-pay | Admitting: Internal Medicine

## 2014-05-15 DIAGNOSIS — I639 Cerebral infarction, unspecified: Secondary | ICD-10-CM

## 2014-05-15 NOTE — Telephone Encounter (Signed)
Spoke with patient about recent carotid results

## 2014-05-15 NOTE — Telephone Encounter (Signed)
Follow up  ° ° °Patient returning call back to nurse  °

## 2014-05-21 NOTE — Progress Notes (Signed)
Loop recorder 

## 2014-05-28 ENCOUNTER — Telehealth: Payer: Self-pay | Admitting: Cardiology

## 2014-05-28 ENCOUNTER — Other Ambulatory Visit: Payer: Self-pay | Admitting: *Deleted

## 2014-05-28 NOTE — Telephone Encounter (Signed)
Discussed with Dr Lovena Le.  He does not have a mechanical valve but I explained the PA will not go through with valve problems.  Dr Lovena Le recommended he switch to Warfarin 5 mg daily and have it checked in 4 days after starting.  Alvis Lemmings, RN is going to discuss with Sindy Guadeloupe D

## 2014-05-28 NOTE — Telephone Encounter (Signed)
Reviewed the patient's chart. The patient has a history of bioprosthetic aortic valve replacement in 07/2013 with Dr. Roxy Manns. He currently has a LINQ recorder which has showed some a-fib. On 05/06/14 the patient was instructed to d/c plavix, continue ASA 81 mg daily and start Eliquis 5 mg BID per Dr. Lovena Le. The patient needs a Prior Authorization for his Eliquis- will forward to Dr. Lovena Le for review due to the patient's AVR.

## 2014-05-28 NOTE — Telephone Encounter (Signed)
New Msg     Pt c/o medication issue:  1. Name of Medication: Eliquis  2. How are you currently taking this medication (dosage and times per day)? 5 mg, twice daily  3. Are you having a reaction (difficulty breathing--STAT)? No  4. What is your medication issue? Pt will out of medication next week and needs a prior authorization.  Pt states he dropped off papers to be completed about two weeks ago.  Pt states insurance company may be contacted [phone], patient  Is confused about process.  Please return pt call as well.

## 2014-05-28 NOTE — Telephone Encounter (Signed)
I called [facility] at [phone] to attempt to initiate PA for the patient's Eliquis. This has gone in to review. I called the patient and explained this to him and that the reason is due to his valve replacement. I have advised him that we should hopefully know by tomorrow or on Monday if this has been approved or not, but that my guess is this will be denied. I have explained to him if it is denied, then per Dr. Lovena Le, we will need to start him on Coumadin 5 mg daily. I have also reviewed this with Elberta Leatherwood, Pharm D. The patient would need to be transitioned to coumadin as below:  1) Take Eliquis 5 mg BID and Coumadin 5 mg once daily together for 3 days 2) Day 4- stop Eliquis and continue Coumadin 5 mg once daily 3) Day 5- He will need an INR check in [facility]  The patient states he would like to wait and see if the Eliquis is approved prior to switching to Warfarin. I advised him we will be back with him once notification is sent in regards to his Eliquis. 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

## 2014-05-29 ENCOUNTER — Telehealth: Payer: Self-pay | Admitting: Internal Medicine

## 2014-05-29 ENCOUNTER — Other Ambulatory Visit: Payer: Self-pay | Admitting: *Deleted

## 2014-05-29 MED ORDER — WARFARIN SODIUM 5 MG PO TABS: ORAL_TABLET | ORAL | Status: DC

## 2014-05-29 NOTE — Telephone Encounter (Signed)
Calling back stating insurance denied his Eliquis.  Gave him instructions to (1) Take Eliquis 5 mg in AM and PM today; start Coumadin 5 mg at dinner today x 3 days which will be Fri, Sat, Sunday; (2) then Monday 3/28 do not take Eliquis; only coumadin 5 mg at dinner; and (3)  Come to office at 10:30 on Tuesday 3/29 to have INR-new coumadin start.  He verbalizes understanding and will come Tues for lab.

## 2014-05-29 NOTE — Telephone Encounter (Signed)
Follow up      Pt calling to see if he can take warfarin instead of eliquis.  Ins denied eliquis.  He said he called yesterday but no one has called him back.

## 2014-05-29 NOTE — Telephone Encounter (Signed)
error 

## 2014-06-02 ENCOUNTER — Ambulatory Visit (INDEPENDENT_AMBULATORY_CARE_PROVIDER_SITE_OTHER): Payer: Medicare Other | Admitting: Pharmacist

## 2014-06-02 DIAGNOSIS — I4891 Unspecified atrial fibrillation: Secondary | ICD-10-CM | POA: Diagnosis not present

## 2014-06-02 DIAGNOSIS — D6852 Prothrombin gene mutation: Secondary | ICD-10-CM | POA: Diagnosis not present

## 2014-06-02 DIAGNOSIS — Z953 Presence of xenogenic heart valve: Secondary | ICD-10-CM

## 2014-06-02 DIAGNOSIS — Z954 Presence of other heart-valve replacement: Secondary | ICD-10-CM | POA: Diagnosis not present

## 2014-06-02 DIAGNOSIS — D6859 Other primary thrombophilia: Secondary | ICD-10-CM

## 2014-06-02 DIAGNOSIS — I824Y9 Acute embolism and thrombosis of unspecified deep veins of unspecified proximal lower extremity: Secondary | ICD-10-CM | POA: Insufficient documentation

## 2014-06-02 LAB — MDC_IDC_ENUM_SESS_TYPE_REMOTE
Date Time Interrogation Session: 20160229220628
Zone Setting Detection Interval: 2000 ms
Zone Setting Detection Interval: 3000 ms
Zone Setting Detection Interval: 390 ms

## 2014-06-02 LAB — POCT INR: INR: 2.1

## 2014-06-12 ENCOUNTER — Ambulatory Visit (INDEPENDENT_AMBULATORY_CARE_PROVIDER_SITE_OTHER): Payer: Medicare Other

## 2014-06-12 DIAGNOSIS — D6852 Prothrombin gene mutation: Secondary | ICD-10-CM | POA: Diagnosis not present

## 2014-06-12 DIAGNOSIS — D6859 Other primary thrombophilia: Secondary | ICD-10-CM

## 2014-06-12 DIAGNOSIS — I4891 Unspecified atrial fibrillation: Secondary | ICD-10-CM | POA: Diagnosis not present

## 2014-06-12 DIAGNOSIS — Z953 Presence of xenogenic heart valve: Secondary | ICD-10-CM

## 2014-06-12 DIAGNOSIS — Z954 Presence of other heart-valve replacement: Secondary | ICD-10-CM

## 2014-06-12 LAB — PROTIME-INR
INR: 6.6 ratio (ref 0.8–1.0)
Prothrombin Time: 69.6 s (ref 9.6–13.1)

## 2014-06-12 LAB — POCT INR: INR: 6.2

## 2014-06-15 ENCOUNTER — Ambulatory Visit (INDEPENDENT_AMBULATORY_CARE_PROVIDER_SITE_OTHER): Payer: Medicare Other | Admitting: *Deleted

## 2014-06-15 DIAGNOSIS — I639 Cerebral infarction, unspecified: Secondary | ICD-10-CM | POA: Diagnosis not present

## 2014-06-16 NOTE — Progress Notes (Signed)
Loop recorder 

## 2014-06-19 ENCOUNTER — Ambulatory Visit (INDEPENDENT_AMBULATORY_CARE_PROVIDER_SITE_OTHER): Payer: Medicare Other | Admitting: *Deleted

## 2014-06-19 DIAGNOSIS — D6859 Other primary thrombophilia: Secondary | ICD-10-CM

## 2014-06-19 DIAGNOSIS — I4891 Unspecified atrial fibrillation: Secondary | ICD-10-CM

## 2014-06-19 DIAGNOSIS — Z954 Presence of other heart-valve replacement: Secondary | ICD-10-CM | POA: Diagnosis not present

## 2014-06-19 DIAGNOSIS — D6852 Prothrombin gene mutation: Secondary | ICD-10-CM | POA: Diagnosis not present

## 2014-06-19 DIAGNOSIS — Z953 Presence of xenogenic heart valve: Secondary | ICD-10-CM

## 2014-06-19 LAB — POCT INR: INR: 3

## 2014-06-26 ENCOUNTER — Ambulatory Visit (INDEPENDENT_AMBULATORY_CARE_PROVIDER_SITE_OTHER): Payer: Medicare Other

## 2014-06-26 DIAGNOSIS — Z954 Presence of other heart-valve replacement: Secondary | ICD-10-CM

## 2014-06-26 DIAGNOSIS — D6859 Other primary thrombophilia: Secondary | ICD-10-CM

## 2014-06-26 DIAGNOSIS — I4891 Unspecified atrial fibrillation: Secondary | ICD-10-CM | POA: Diagnosis not present

## 2014-06-26 DIAGNOSIS — D6852 Prothrombin gene mutation: Secondary | ICD-10-CM

## 2014-06-26 DIAGNOSIS — Z953 Presence of xenogenic heart valve: Secondary | ICD-10-CM

## 2014-06-26 LAB — POCT INR: INR: 3.8

## 2014-07-03 ENCOUNTER — Ambulatory Visit (INDEPENDENT_AMBULATORY_CARE_PROVIDER_SITE_OTHER): Payer: Medicare Other | Admitting: *Deleted

## 2014-07-03 DIAGNOSIS — Z954 Presence of other heart-valve replacement: Secondary | ICD-10-CM

## 2014-07-03 DIAGNOSIS — I824Y9 Acute embolism and thrombosis of unspecified deep veins of unspecified proximal lower extremity: Secondary | ICD-10-CM | POA: Diagnosis not present

## 2014-07-03 DIAGNOSIS — Z953 Presence of xenogenic heart valve: Secondary | ICD-10-CM

## 2014-07-03 DIAGNOSIS — D6859 Other primary thrombophilia: Secondary | ICD-10-CM

## 2014-07-03 DIAGNOSIS — I4891 Unspecified atrial fibrillation: Secondary | ICD-10-CM

## 2014-07-03 DIAGNOSIS — D6852 Prothrombin gene mutation: Secondary | ICD-10-CM | POA: Diagnosis not present

## 2014-07-03 LAB — POCT INR: INR: 2.8

## 2014-07-15 ENCOUNTER — Ambulatory Visit (INDEPENDENT_AMBULATORY_CARE_PROVIDER_SITE_OTHER): Payer: Medicare Other | Admitting: *Deleted

## 2014-07-15 DIAGNOSIS — I639 Cerebral infarction, unspecified: Secondary | ICD-10-CM | POA: Diagnosis not present

## 2014-07-17 ENCOUNTER — Ambulatory Visit (INDEPENDENT_AMBULATORY_CARE_PROVIDER_SITE_OTHER): Payer: Medicare Other | Admitting: *Deleted

## 2014-07-17 DIAGNOSIS — I4891 Unspecified atrial fibrillation: Secondary | ICD-10-CM | POA: Diagnosis not present

## 2014-07-17 DIAGNOSIS — Z953 Presence of xenogenic heart valve: Secondary | ICD-10-CM

## 2014-07-17 DIAGNOSIS — D6859 Other primary thrombophilia: Secondary | ICD-10-CM

## 2014-07-17 DIAGNOSIS — D6852 Prothrombin gene mutation: Secondary | ICD-10-CM

## 2014-07-17 DIAGNOSIS — Z954 Presence of other heart-valve replacement: Secondary | ICD-10-CM

## 2014-07-17 LAB — POCT INR: INR: 3

## 2014-07-17 NOTE — Progress Notes (Signed)
Loop recorder 

## 2014-07-20 ENCOUNTER — Telehealth: Payer: Self-pay | Admitting: Cardiology

## 2014-07-20 LAB — CUP PACEART REMOTE DEVICE CHECK: Date Time Interrogation Session: 20160516151726

## 2014-07-20 NOTE — Telephone Encounter (Signed)
New message    Patient calling C/O bump his knee. Big as hen head . H/O coumadin   2.5 mg 5 days .  5 mg  For  2 day   Pt c/o medication issue:  1. Name of Medication: coumadin    2. How are you currently taking this medication (dosage and times per day)? 2.5 mg 5 days .  5 mg for 2 days   3. Are you having a reaction (difficulty breathing--STAT)? Bump his knee  - patient says big as hen head.    4. What is your medication issue? What to do

## 2014-07-24 NOTE — Telephone Encounter (Signed)
LMTCB

## 2014-07-27 ENCOUNTER — Encounter: Payer: Self-pay | Admitting: Thoracic Surgery (Cardiothoracic Vascular Surgery)

## 2014-07-27 ENCOUNTER — Ambulatory Visit (INDEPENDENT_AMBULATORY_CARE_PROVIDER_SITE_OTHER): Payer: Medicare Other | Admitting: Thoracic Surgery (Cardiothoracic Vascular Surgery)

## 2014-07-27 VITALS — BP 133/78 | HR 55 | Resp 16 | Ht 75.0 in | Wt 194.0 lb

## 2014-07-27 DIAGNOSIS — Z951 Presence of aortocoronary bypass graft: Secondary | ICD-10-CM

## 2014-07-27 DIAGNOSIS — I350 Nonrheumatic aortic (valve) stenosis: Secondary | ICD-10-CM | POA: Diagnosis not present

## 2014-07-27 DIAGNOSIS — I2510 Atherosclerotic heart disease of native coronary artery without angina pectoris: Secondary | ICD-10-CM

## 2014-07-27 DIAGNOSIS — Z954 Presence of other heart-valve replacement: Secondary | ICD-10-CM | POA: Diagnosis not present

## 2014-07-27 DIAGNOSIS — Z953 Presence of xenogenic heart valve: Secondary | ICD-10-CM

## 2014-07-27 NOTE — Telephone Encounter (Signed)
Spoke with pt, he states he is not having any swelling, bruising or mobility problems with his knee s/p injury.  Offered pt a sooner appt in clinic to get INR checked, pt denies needing sooner appt, will call back if wishes to be seen sooner.

## 2014-07-27 NOTE — Telephone Encounter (Signed)
Pt's wife states pt at dermatologist appt now and will ask him to call when he returns.  Pt's wife states pt was moving an old TV about a week ago, fell and hit both knees, pt did not hit his head when he fell. Pt's wife pt is walking without problems but does have a knot on one of his knees.   LM for pt to call back.

## 2014-07-27 NOTE — Telephone Encounter (Signed)
Pt states his knees are not bruised or swollen, he is walking without problems.  Pt advised not to make any changes at present time, that I will forward to [facility] for review

## 2014-07-27 NOTE — Progress Notes (Signed)
[address]       [place] [address]             [phone]     CARDIOTHORACIC SURGERY OFFICE NOTE  Referring Provider is Larey Dresser, MD PCP is Simona Huh, MD   HPI:  Patient returns for routine followup approximately 1 year status post aortic valve replacement using a bioprosthetic tissue valve and single-vessel coronary artery bypass grafting on 07/11/2013. His postoperative recovery was uneventful and he was last seen here in our office on 11/24/2013.  Since then he has been followed carefully by Dr. Aundra Dubin. Because of a previous stroke the patient had a loop recorder placed to monitor for occult atrial fibrillation. He apparently recently had a brief episode of atrial fibrillation noted on his loop monitor, and subsequently he has been started on warfarin for long-term anticoagulation. The patient reports that he has been feeling well. He states that he feels somewhat improved in comparison with how he felt prior to his surgery last year. He has not had any other significant cardiac issues over the past year. He denies any symptoms of exertional shortness of breath or chest discomfort. He states that his energy level remains marginal and he gets fatigued with activity, but he does not have any associated shortness of breath or chest tightness. The remainder of his review of systems is unremarkable.   Current Outpatient Prescriptions  Medication Sig Dispense Refill  . acetaminophen (TYLENOL) 500 MG tablet Take 1,000 mg by mouth every 6 (six) hours as needed for mild pain.    Marland Kitchen aspirin EC 81 MG EC tablet Take 1 tablet (81 mg total) by mouth daily.    . carvedilol (COREG) 3.125 MG tablet Take 3.125 mg by mouth 2 (two) times daily with a meal.    . Multiple Vitamin (MULTIVITAMIN WITH MINERALS) TABS tablet Take 1 tablet by mouth as needed.     Marland Kitchen omeprazole (PRILOSEC) 20 MG capsule Take 20 mg by mouth daily as needed (for acid reduction).     Vladimir Faster  Glycol-Propyl Glycol (SYSTANE OP) Place 1 drop into both eyes daily as needed (for dry eyes).     . rosuvastatin (CRESTOR) 10 MG tablet Take 10 mg by mouth daily.    . Tamsulosin HCl (FLOMAX) 0.4 MG CAPS Take 0.4 mg by mouth at bedtime.     Marland Kitchen warfarin (COUMADIN) 5 MG tablet Take as directed 90 tablet 3   No current facility-administered medications for this visit.      Physical Exam:   BP 133/78 mmHg  Pulse 55  Resp 16  Ht 6\' 3"  (1.905 m)  Wt 194 lb (87.998 kg)  BMI 24.25 kg/m2  SpO2 98%  General:  Well-appearing  Chest:   clear  CV:   Regular rate and rhythm without murmur  Incisions:  Completely healed, sternum is stable  Abdomen:  Soft and nontender  Extremities:  Warm and well-perfused  Diagnostic Tests:  n/a   Impression:  Patient is doing very well approximately one year following aortic valve replacement with a bioprosthetic tissue valve and single-vessel coronary artery bypass grafting. He denies any associated symptoms of exertional shortness of breath or chest discomfort at this time. Follow-up echocardiogram performed 08/25/2013 revealed normal functioning bioprosthetic tissue valve in the aortic position with normal left ventricular systolic function.  Plan:  In the future the patient will call and return to see [place] as needed. He has been reminded regarding a lifelong  need for antibiotic prophylaxis for all dental cleaning and related procedures. The patient has been reminded of the long term benefits of regular exercise and a "heart healthy" diet.  All of his questions been addressed.  I spent in excess of 15 minutes during the conduct of this office consultation and >50% of this time involved direct face-to-face encounter with the patient for counseling and/or coordination of their care.    [name]. Roxy Manns, MD 07/27/2014 2:36 PM

## 2014-07-27 NOTE — Telephone Encounter (Signed)
Follow up ° ° ° ° °Returning Sean Brock's call °

## 2014-07-27 NOTE — Patient Instructions (Signed)

## 2014-08-04 LAB — CUP PACEART REMOTE DEVICE CHECK: MDC IDC SESS DTM: 20160531164919

## 2014-08-07 ENCOUNTER — Ambulatory Visit (INDEPENDENT_AMBULATORY_CARE_PROVIDER_SITE_OTHER): Payer: Medicare Other | Admitting: *Deleted

## 2014-08-07 DIAGNOSIS — D6852 Prothrombin gene mutation: Secondary | ICD-10-CM | POA: Diagnosis not present

## 2014-08-07 DIAGNOSIS — Z954 Presence of other heart-valve replacement: Secondary | ICD-10-CM

## 2014-08-07 DIAGNOSIS — Z953 Presence of xenogenic heart valve: Secondary | ICD-10-CM

## 2014-08-07 DIAGNOSIS — D6859 Other primary thrombophilia: Secondary | ICD-10-CM

## 2014-08-07 DIAGNOSIS — I4891 Unspecified atrial fibrillation: Secondary | ICD-10-CM | POA: Diagnosis not present

## 2014-08-07 LAB — POCT INR: INR: 2.5

## 2014-08-14 ENCOUNTER — Ambulatory Visit (INDEPENDENT_AMBULATORY_CARE_PROVIDER_SITE_OTHER): Payer: Medicare Other | Admitting: *Deleted

## 2014-08-14 DIAGNOSIS — I6300 Cerebral infarction due to thrombosis of unspecified precerebral artery: Secondary | ICD-10-CM

## 2014-08-14 NOTE — Progress Notes (Signed)
Loop recorder 

## 2014-08-17 LAB — CUP PACEART REMOTE DEVICE CHECK: MDC IDC SESS DTM: 20160613155236

## 2014-08-19 ENCOUNTER — Encounter: Payer: Self-pay | Admitting: Internal Medicine

## 2014-08-25 ENCOUNTER — Encounter: Payer: Self-pay | Admitting: Internal Medicine

## 2014-08-28 ENCOUNTER — Encounter: Payer: Self-pay | Admitting: Internal Medicine

## 2014-09-04 ENCOUNTER — Ambulatory Visit (INDEPENDENT_AMBULATORY_CARE_PROVIDER_SITE_OTHER): Payer: Medicare Other | Admitting: *Deleted

## 2014-09-04 DIAGNOSIS — Z953 Presence of xenogenic heart valve: Secondary | ICD-10-CM

## 2014-09-04 DIAGNOSIS — Z954 Presence of other heart-valve replacement: Secondary | ICD-10-CM | POA: Diagnosis not present

## 2014-09-04 DIAGNOSIS — D6852 Prothrombin gene mutation: Secondary | ICD-10-CM

## 2014-09-04 DIAGNOSIS — D6859 Other primary thrombophilia: Secondary | ICD-10-CM

## 2014-09-04 DIAGNOSIS — I4891 Unspecified atrial fibrillation: Secondary | ICD-10-CM | POA: Diagnosis not present

## 2014-09-04 LAB — POCT INR: INR: 2.4

## 2014-09-14 ENCOUNTER — Ambulatory Visit (INDEPENDENT_AMBULATORY_CARE_PROVIDER_SITE_OTHER): Payer: Medicare Other | Admitting: *Deleted

## 2014-09-14 DIAGNOSIS — I639 Cerebral infarction, unspecified: Secondary | ICD-10-CM

## 2014-09-15 NOTE — Progress Notes (Signed)
Loop recorder 

## 2014-10-02 ENCOUNTER — Ambulatory Visit (INDEPENDENT_AMBULATORY_CARE_PROVIDER_SITE_OTHER): Payer: Medicare Other | Admitting: *Deleted

## 2014-10-02 DIAGNOSIS — Z954 Presence of other heart-valve replacement: Secondary | ICD-10-CM | POA: Diagnosis not present

## 2014-10-02 DIAGNOSIS — D6852 Prothrombin gene mutation: Secondary | ICD-10-CM

## 2014-10-02 DIAGNOSIS — Z953 Presence of xenogenic heart valve: Secondary | ICD-10-CM

## 2014-10-02 DIAGNOSIS — D6859 Other primary thrombophilia: Secondary | ICD-10-CM

## 2014-10-02 DIAGNOSIS — I4891 Unspecified atrial fibrillation: Secondary | ICD-10-CM | POA: Diagnosis not present

## 2014-10-02 LAB — POCT INR: INR: 2.7

## 2014-10-12 LAB — CUP PACEART REMOTE DEVICE CHECK: Date Time Interrogation Session: 20160808142040

## 2014-10-13 ENCOUNTER — Ambulatory Visit (INDEPENDENT_AMBULATORY_CARE_PROVIDER_SITE_OTHER): Payer: Medicare Other | Admitting: *Deleted

## 2014-10-13 DIAGNOSIS — I639 Cerebral infarction, unspecified: Secondary | ICD-10-CM | POA: Diagnosis not present

## 2014-10-13 LAB — CUP PACEART REMOTE DEVICE CHECK: MDC IDC SESS DTM: 20160809123156

## 2014-10-13 NOTE — Progress Notes (Signed)
Loop recorder 

## 2014-11-04 ENCOUNTER — Ambulatory Visit (INDEPENDENT_AMBULATORY_CARE_PROVIDER_SITE_OTHER): Payer: Medicare Other | Admitting: Cardiology

## 2014-11-04 ENCOUNTER — Encounter: Payer: Self-pay | Admitting: Cardiology

## 2014-11-04 VITALS — BP 150/80 | HR 60 | Ht 75.0 in | Wt 195.0 lb

## 2014-11-04 DIAGNOSIS — Z954 Presence of other heart-valve replacement: Secondary | ICD-10-CM | POA: Diagnosis not present

## 2014-11-04 DIAGNOSIS — I10 Essential (primary) hypertension: Secondary | ICD-10-CM

## 2014-11-04 DIAGNOSIS — I639 Cerebral infarction, unspecified: Secondary | ICD-10-CM

## 2014-11-04 DIAGNOSIS — I4891 Unspecified atrial fibrillation: Secondary | ICD-10-CM

## 2014-11-04 DIAGNOSIS — I6523 Occlusion and stenosis of bilateral carotid arteries: Secondary | ICD-10-CM | POA: Diagnosis not present

## 2014-11-04 DIAGNOSIS — E785 Hyperlipidemia, unspecified: Secondary | ICD-10-CM

## 2014-11-04 DIAGNOSIS — I2510 Atherosclerotic heart disease of native coronary artery without angina pectoris: Secondary | ICD-10-CM

## 2014-11-04 DIAGNOSIS — Z952 Presence of prosthetic heart valve: Secondary | ICD-10-CM

## 2014-11-04 NOTE — Progress Notes (Signed)
Cardiology Office Note   Date:  11/04/2014   ID:  RUHAN BORAK, DOB [birth year], MRN [id number]  PCP:  Simona Huh, MD  Cardiologist:  Dr. Aundra Dubin    Chief Complaint  Patient presents with  . Coronary Artery Disease      History of Present Illness: JR MILLIRON is a 80 y.o. male who presents for  status post aortic valve replacement using a bioprosthetic tissue valve and single-vessel coronary artery bypass grafting on 07/11/2013. His postoperative recovery was uneventful.  He does have a history of a fib on loop monitor and is on warfarin.  Followed in our clinic with last INR of 2.7.  Other hx with Rt parietal infarct.  Other hx as below.   Today no complaints, no chest pain, no SOB, no edema.  He does exercise and tries to stick to a healthy diet.     Carotid dopplers done in March with Lt ICA stenosis of 60-79% and Rt ICA 40-50%.  For follow up in Sept.  His BP is elevated today, he does have appt with Dr. Ivette Loyal in a week or so if BP continues to be elevated may need ACE, his HR is 60 so not sure he could tolerate higher dose of coreg. Last echo 2015 and stable valve and EF 55-60%.    Past Medical History  Diagnosis Date  . HYPERLIPIDEMIA   . HYPERTENSION   . AORTIC STENOSIS     a. Echo (05/26/13):  Mod LVH, vigorous LVF, EF 65-70%, no RWMA, Gr 1 DD, severe AS (mean 40 mmHg) => s/p bioprosthetic AVR 07/2013  . Former smoker   . DYSPEPSIA   . Palpitations     a. Event monitor (5/13) with occasional PVCs, PACs but no significant arrhythmia.   Marland Kitchen GERD (gastroesophageal reflux disease)   . Trigeminal neuropathy   . Arthritis     BACK  . ED (erectile dysfunction)   . CVA (cerebral infarction)     a. 3/14 right parietal infarct. Carotid US 3/14 showed no significant disease. He has an implanted loop recorder to look for atrial fibrillation.   . Vertigo   . Sinus bradycardia     a. 2013 - HR 40's in the office. BB stopped.  Marland Kitchen History of kidney stones   . S/P  aortic valve replacement with bioprosthetic valve + CABG x1 07/11/2013    23 mm [facility] Ease bovine pericardial tissue valve;  Echo (08/2013):  EF 55-60%, no RWMA, AVR ok, Asc Ao 40 mm (mildly dilated), mild LAE, mild RVE, lipomatous hypertrophy, mod TR, PASP 36 mmHg  . S/P CABG x 1 07/11/2013    SVG to PDA with EVH via right thigh  . CAD (coronary artery disease)     a. Lexiscan myoview (5/13) with EF 70%, no ischemia or infarction;   b. LHC (06/2013):  Dist LM 30%, ostial LAD 40-50%, prox LAD 30%, ostial D1 80-90%, prox CFX 40%, RCA occluded with L-R collats => CABG (S-PDA at time of AVR)    Past Surgical History  Procedure Laterality Date  . Cataract extraction    . Lithotripsy    . Mouth surgery    . Inguinal hernia repair Right 01/15/2013    Procedure: HERNIA REPAIR INGUINAL ADULT;  Surgeon: Harl Bowie, MD;  Location: [place];  Service: General;  Laterality: Right;  . Insertion of mesh Right 01/15/2013    Procedure: INSERTION OF MESH;  Surgeon: Harl Bowie, MD;  Location:  [place];  Service: General;  Laterality: Right;  . Loop recorder implant  04/15/2013    MDT LinQ implanred by Dr Lovena Le for cryptogenic stroke  . Aortic valve replacement N/A 07/11/2013    Procedure: AORTIC VALVE REPLACEMENT (AVR);  Surgeon: [name], MD;  Location: [place];  Service: Open Heart Surgery;  Laterality: N/A;  . Coronary artery bypass graft N/A 07/11/2013    Procedure: CORONARY ARTERY BYPASS GRAFTING (CABG) x1;  Surgeon: [name], MD;  Location: [place];  Service: Open Heart Surgery;  Laterality: N/A;  . Intraoperative transesophageal echocardiogram N/A 07/11/2013    Procedure: INTRAOPERATIVE TRANSESOPHAGEAL ECHOCARDIOGRAM;  Surgeon: [name], MD;  Location: [place];  Service: Open Heart Surgery;  Laterality: N/A;  . Loop recorder implant N/A 04/14/2013    Procedure: LOOP RECORDER IMPLANT;  Surgeon: Evans Lance, MD;  Location[facility] CATH LAB;   Service: Cardiovascular;  Laterality: N/A;  . Left and right heart catheterization with coronary angiogram N/A 06/06/2013    Procedure: LEFT AND RIGHT HEART CATHETERIZATION WITH CORONARY ANGIOGRAM;  Surgeon: Larey Dresser, MD;  Location: [facility] CATH LAB;  Service: Cardiovascular;  Laterality: N/A;     Current Outpatient Prescriptions  Medication Sig Dispense Refill  . acetaminophen (TYLENOL) 500 MG tablet Take 1,000 mg by mouth every 6 (six) hours as needed for mild pain.    Marland Kitchen aspirin EC 81 MG EC tablet Take 1 tablet (81 mg total) by mouth daily.    . carvedilol (COREG) 3.125 MG tablet Take 3.125 mg by mouth 2 (two) times daily with a meal.    . docusate sodium (COLACE) 100 MG capsule Takes as needed about 3 times a week    . Multiple Vitamin (MULTIVITAMIN WITH MINERALS) TABS tablet Take 1 tablet by mouth as needed.     Marland Kitchen omeprazole (PRILOSEC) 20 MG capsule Take 20 mg by mouth daily as needed (for acid reduction).     Vladimir Faster Glycol-Propyl Glycol (SYSTANE OP) Place 1 drop into both eyes daily as needed (for dry eyes).     . rosuvastatin (CRESTOR) 10 MG tablet Take 10 mg by mouth daily.    . Tamsulosin HCl (FLOMAX) 0.4 MG CAPS Take 0.4 mg by mouth as needed (for prostate).     . warfarin (COUMADIN) 5 MG tablet Take as directed 90 tablet 3   No current facility-administered medications for this visit.    Allergies:   Sudafed and Cardizem    Social History:  The patient  reports that he quit smoking about 5 years ago. His smoking use included Cigarettes. He has a 30 pack-year smoking history. He has never used smokeless tobacco. He reports that he does not drink alcohol or use illicit drugs.   Family History:  The patient's family history includes Cancer in his sister; Diabetes in his mother and another family member; Heart attack in his father and mother; Hypertension in his father and another family member; Lung cancer in an other family member. There is no history of Stroke.    ROS:   General:no colds or fevers, no weight changes since May. Skin:no rashes or ulcers HEENT:no blurred vision, no congestion CV:see HPI PUL:see HPI GI:no diarrhea constipation or melena, no indigestion GU:no hematuria, no dysuria MS:no joint pain, no claudication Neuro:no syncope, no lightheadedness Endo:no diabetes, no thyroid disease  Wt Readings from Last 3 Encounters:  11/04/14 195 lb (88.451 kg)  07/27/14 194 lb (87.998 kg)  04/23/14 200 lb (90.719 kg)  PHYSICAL EXAM: VS:  BP 150/80 mmHg  Pulse 60  Ht 6\' 3"  (1.905 m)  Wt 195 lb (88.451 kg)  BMI 24.37 kg/m2 , BMI Body mass index is 24.37 kg/(m^2). General:Pleasant affect, NAD Skin:Warm and dry, brisk capillary refill HEENT:normocephalic, sclera clear, mucus membranes moist Neck:supple, no JVD, no bruits  Heart:S1S2 RRR with soft 1/6 systolic murmur, no gallup, rub or click Lungs:clear without rales, rhonchi, or wheezes NOM:VEHM, non tender, + BS, do not palpate liver spleen or masses Ext:no lower ext edema, 1+ pedal pulses, 2+ radial pulses Neuro:alert and oriented X 3, MAE, follows commands, + facial symmetry    EKG:  EKG is NOT ordered today.  Recent Labs: 04/23/2014: BUN 20; Creatinine, Ser 0.85; Potassium 4.1; Sodium 138    Lipid Panel    Component Value Date/Time   CHOL 118 04/23/2014 1417   TRIG 191.0* 04/23/2014 1417   HDL 31.80* 04/23/2014 1417   CHOLHDL 4 04/23/2014 1417   VLDL 38.2 04/23/2014 1417   LDLCALC 48 04/23/2014 1417       Other studies Reviewed: Additional studies/ records that were reviewed today include: se above.   ASSESSMENT AND PLAN:   1.  AORTIC STENOSIS  Status post bioprosthetic AVR. Post-op echo showed well-seated valve in 6/15. He will need endocarditis prophylaxis with dental work. follow up in 6 months with Dr. Aundra Dubin  2. CAD SVG-PDA with AVR in 5/15. No chest pain. Continue ASA 81, Plavix 75, and Crestor 10.   3. CVA CVA in 3/14. He has an implanted loop  recorder now to monitor for paroxysmal atrial fibrillation. He was noted to have and plavix stopped and placed on coumadin.  4. PAF  CHA2DS2VASc score 6  now on anticoagulation with coumadin followed by our office.   5. Hyperlipidemia LDL 48. Would like to keep LDL < 70 with history of CVA. Continue Crestor.   6. Carotid stenosis Repeat carotid dopplers 9/16     Current medicines are reviewed with the patient today.  The patient Has no concerns regarding medicines.  The following changes have been made:  See above Labs/ tests ordered today include:see above  Disposition:   FU:  see above  Signed, Isaiah Serge, NP  11/04/2014 9:18 AM    [facility] [place], [place], [place], [place] Phone: [phone]; Fax: [phone]

## 2014-11-04 NOTE — Patient Instructions (Signed)
Medication Instructions:  Your physician recommends that you continue on your current medications as directed. Please refer to the Current Medication list given to you today.   Labwork: none  Testing/Procedures: Your physician has requested that you have a carotid duplex. This test is an ultrasound of the carotid arteries in your neck. It looks at blood flow through these arteries that supply the brain with blood. Allow one hour for this exam. There are no restrictions or special instructions.  This is scheduled for September 14,2016    Follow-Up: Your physician wants you to follow-up in: 6 months with Dr. Aundra Dubin.  You will receive a reminder letter in the mail two months in advance. If you don't receive a letter, please call our office to schedule the follow-up appointment.   Any Other Special Instructions Will Be Listed Below (If Applicable).

## 2014-11-05 ENCOUNTER — Encounter: Payer: Self-pay | Admitting: Internal Medicine

## 2014-11-11 ENCOUNTER — Other Ambulatory Visit: Payer: Self-pay | Admitting: Cardiology

## 2014-11-11 DIAGNOSIS — I6523 Occlusion and stenosis of bilateral carotid arteries: Secondary | ICD-10-CM

## 2014-11-12 ENCOUNTER — Encounter: Payer: Self-pay | Admitting: Cardiology

## 2014-11-12 ENCOUNTER — Ambulatory Visit (INDEPENDENT_AMBULATORY_CARE_PROVIDER_SITE_OTHER): Payer: Medicare Other | Admitting: *Deleted

## 2014-11-12 DIAGNOSIS — I639 Cerebral infarction, unspecified: Secondary | ICD-10-CM | POA: Diagnosis not present

## 2014-11-12 NOTE — Progress Notes (Signed)
Loop recorder 

## 2014-11-13 ENCOUNTER — Telehealth: Payer: Self-pay | Admitting: Cardiology

## 2014-11-13 ENCOUNTER — Ambulatory Visit (INDEPENDENT_AMBULATORY_CARE_PROVIDER_SITE_OTHER): Payer: Medicare Other | Admitting: *Deleted

## 2014-11-13 DIAGNOSIS — I4891 Unspecified atrial fibrillation: Secondary | ICD-10-CM | POA: Diagnosis not present

## 2014-11-13 DIAGNOSIS — Z954 Presence of other heart-valve replacement: Secondary | ICD-10-CM

## 2014-11-13 DIAGNOSIS — Z953 Presence of xenogenic heart valve: Secondary | ICD-10-CM

## 2014-11-13 DIAGNOSIS — D6852 Prothrombin gene mutation: Secondary | ICD-10-CM | POA: Diagnosis not present

## 2014-11-13 DIAGNOSIS — D6859 Other primary thrombophilia: Secondary | ICD-10-CM

## 2014-11-13 LAB — POCT INR: INR: 2.4

## 2014-11-13 NOTE — Telephone Encounter (Signed)
New message ° ° ° ° °Returning Anne's call °

## 2014-11-13 NOTE — Telephone Encounter (Signed)
Spoke with patient about recent lab results 

## 2014-11-18 ENCOUNTER — Ambulatory Visit (HOSPITAL_COMMUNITY)
Admission: RE | Admit: 2014-11-18 | Discharge: 2014-11-18 | Disposition: A | Discharge status: Home / Self Care | Payer: Medicare Other | Source: Ambulatory Visit | Attending: Cardiovascular Disease | Admitting: Cardiovascular Disease

## 2014-11-18 DIAGNOSIS — E785 Hyperlipidemia, unspecified: Secondary | ICD-10-CM | POA: Diagnosis not present

## 2014-11-18 DIAGNOSIS — I10 Essential (primary) hypertension: Secondary | ICD-10-CM | POA: Insufficient documentation

## 2014-11-18 DIAGNOSIS — I6523 Occlusion and stenosis of bilateral carotid arteries: Secondary | ICD-10-CM | POA: Diagnosis not present

## 2014-12-11 LAB — CUP PACEART REMOTE DEVICE CHECK: Date Time Interrogation Session: 20160908063702

## 2014-12-14 ENCOUNTER — Ambulatory Visit (INDEPENDENT_AMBULATORY_CARE_PROVIDER_SITE_OTHER): Payer: Medicare Other | Admitting: *Deleted

## 2014-12-14 DIAGNOSIS — I639 Cerebral infarction, unspecified: Secondary | ICD-10-CM | POA: Diagnosis not present

## 2014-12-15 NOTE — Progress Notes (Signed)
LOOP RECORDER  

## 2014-12-22 LAB — CUP PACEART REMOTE DEVICE CHECK: Date Time Interrogation Session: 20161008063530

## 2014-12-22 NOTE — Progress Notes (Signed)
Carelink summary report received. Battery status OK. Normal device function. No new symptom episodes, tachy episodes, brady, or pause episodes. No new AF episodes. Monthly summary reports and ROV with GT in 04/2015.

## 2014-12-23 ENCOUNTER — Encounter: Payer: Self-pay | Admitting: Internal Medicine

## 2014-12-25 ENCOUNTER — Ambulatory Visit (INDEPENDENT_AMBULATORY_CARE_PROVIDER_SITE_OTHER): Payer: Medicare Other | Admitting: *Deleted

## 2014-12-25 DIAGNOSIS — Z954 Presence of other heart-valve replacement: Secondary | ICD-10-CM

## 2014-12-25 DIAGNOSIS — I4891 Unspecified atrial fibrillation: Secondary | ICD-10-CM | POA: Diagnosis not present

## 2014-12-25 DIAGNOSIS — D6859 Other primary thrombophilia: Secondary | ICD-10-CM

## 2014-12-25 DIAGNOSIS — I824Y9 Acute embolism and thrombosis of unspecified deep veins of unspecified proximal lower extremity: Secondary | ICD-10-CM

## 2014-12-25 DIAGNOSIS — Z953 Presence of xenogenic heart valve: Secondary | ICD-10-CM

## 2014-12-25 LAB — POCT INR: INR: 2.3

## 2015-01-06 ENCOUNTER — Encounter: Payer: Self-pay | Admitting: Internal Medicine

## 2015-01-11 ENCOUNTER — Ambulatory Visit (INDEPENDENT_AMBULATORY_CARE_PROVIDER_SITE_OTHER): Payer: Medicare Other | Admitting: *Deleted

## 2015-01-11 DIAGNOSIS — I639 Cerebral infarction, unspecified: Secondary | ICD-10-CM | POA: Diagnosis not present

## 2015-01-11 NOTE — Progress Notes (Signed)
Carelink Summary Report / Loop recorder 

## 2015-01-18 ENCOUNTER — Telehealth: Payer: Self-pay | Admitting: *Deleted

## 2015-01-18 ENCOUNTER — Ambulatory Visit (INDEPENDENT_AMBULATORY_CARE_PROVIDER_SITE_OTHER): Payer: Medicare Other | Admitting: *Deleted

## 2015-01-18 DIAGNOSIS — Z954 Presence of other heart-valve replacement: Secondary | ICD-10-CM | POA: Diagnosis not present

## 2015-01-18 DIAGNOSIS — D6859 Other primary thrombophilia: Secondary | ICD-10-CM

## 2015-01-18 DIAGNOSIS — I824Y9 Acute embolism and thrombosis of unspecified deep veins of unspecified proximal lower extremity: Secondary | ICD-10-CM

## 2015-01-18 DIAGNOSIS — I4891 Unspecified atrial fibrillation: Secondary | ICD-10-CM

## 2015-01-18 DIAGNOSIS — Z953 Presence of xenogenic heart valve: Secondary | ICD-10-CM

## 2015-01-18 LAB — POCT INR: INR: 3.3

## 2015-01-18 MED ORDER — ENOXAPARIN SODIUM 100 MG/ML ~~LOC~~ SOLN: 100.0000 mg | Freq: Two times a day (BID) | SUBCUTANEOUS | Status: DC

## 2015-01-18 NOTE — Patient Instructions (Signed)
01/19/2015 last day to take coumadin  01/20/2015 no coumadin no Lovenox 01/21/2015 no coumadin Lovenox 100 mg 8am and Lovenox 100 mg 8pm 01/22/2015 no coumadin Lovenox 100 mg 8am and lovenox 100 mg 8pm 01/23/2015 no coumadin Lovenox 100 mg 8am and Lovenox 100 mg 8pm 01/24/2015 no coumadin Lovenox 100 mg 8am only do not give Lovenox in the pm 01/25/2015 no coumadin no lovenox day of procedure and after procedure when instructed by MD doing colonoscopy to restart coumadin and Lovenox restart Lovenox at same dose 100 mg at 8am and Lovenox 100 mg 8pm and restart coumadin at same dose 2.5mg  daily except 5mg  on Mondays and Fridays and take extra 1/2 tablet (2.5mg ) for 2 days when restart and continue taking coumadin and Lovenox until rechecked on 01/26/2105

## 2015-01-18 NOTE — Telephone Encounter (Signed)
-----   Message from Larey Dresser, MD sent at 01/18/2015 12:48 PM EST ----- Hold 3 days prior, restart after ----- Message -----    From: Margretta Sidle, RN    Sent: 01/18/2015   8:24 AM      To: Larey Dresser, MD, Katrine Coho, RN  Dr Aundra Dubin  Pt in today for Lovenox Bridge for Colonoscopy on 01/24/2105  Does he need to hold ASA 81 mg daily for this procedure and if he does how long does he need to hold and when to restart  Thanks  Elbert Ewings RN

## 2015-01-18 NOTE — Telephone Encounter (Signed)
Spoke with pt and instructed that Dr Aundra Dubin wants him to hold ASA for 3 days prior to colonoscopy so pt instructed that last day to take ASA in November 17th and to restart after colonoscopy and pt states understanding

## 2015-01-27 ENCOUNTER — Ambulatory Visit (INDEPENDENT_AMBULATORY_CARE_PROVIDER_SITE_OTHER): Payer: Medicare Other | Admitting: Pharmacist

## 2015-01-27 DIAGNOSIS — Z954 Presence of other heart-valve replacement: Secondary | ICD-10-CM | POA: Diagnosis not present

## 2015-01-27 DIAGNOSIS — D6859 Other primary thrombophilia: Secondary | ICD-10-CM

## 2015-01-27 DIAGNOSIS — Z953 Presence of xenogenic heart valve: Secondary | ICD-10-CM

## 2015-01-27 DIAGNOSIS — I824Y9 Acute embolism and thrombosis of unspecified deep veins of unspecified proximal lower extremity: Secondary | ICD-10-CM

## 2015-01-27 DIAGNOSIS — I4891 Unspecified atrial fibrillation: Secondary | ICD-10-CM

## 2015-01-27 LAB — POCT INR: INR: 1.3

## 2015-01-29 ENCOUNTER — Encounter (HOSPITAL_COMMUNITY): Payer: Self-pay | Admitting: Emergency Medicine

## 2015-01-29 ENCOUNTER — Emergency Department (HOSPITAL_COMMUNITY): Payer: Medicare Other

## 2015-01-29 ENCOUNTER — Emergency Department (HOSPITAL_COMMUNITY)
Admission: EM | Admit: 2015-01-29 | Discharge: 2015-01-29 | Disposition: A | Discharge status: Home / Self Care | Payer: Medicare Other | Attending: Emergency Medicine | Admitting: Emergency Medicine

## 2015-01-29 DIAGNOSIS — Z9889 Other specified postprocedural states: Secondary | ICD-10-CM | POA: Diagnosis not present

## 2015-01-29 DIAGNOSIS — K219 Gastro-esophageal reflux disease without esophagitis: Secondary | ICD-10-CM | POA: Insufficient documentation

## 2015-01-29 DIAGNOSIS — Z87891 Personal history of nicotine dependence: Secondary | ICD-10-CM | POA: Diagnosis not present

## 2015-01-29 DIAGNOSIS — I2510 Atherosclerotic heart disease of native coronary artery without angina pectoris: Secondary | ICD-10-CM | POA: Insufficient documentation

## 2015-01-29 DIAGNOSIS — I10 Essential (primary) hypertension: Secondary | ICD-10-CM | POA: Insufficient documentation

## 2015-01-29 DIAGNOSIS — Z7901 Long term (current) use of anticoagulants: Secondary | ICD-10-CM | POA: Diagnosis not present

## 2015-01-29 DIAGNOSIS — Z79899 Other long term (current) drug therapy: Secondary | ICD-10-CM | POA: Diagnosis not present

## 2015-01-29 DIAGNOSIS — Z954 Presence of other heart-valve replacement: Secondary | ICD-10-CM | POA: Diagnosis not present

## 2015-01-29 DIAGNOSIS — Z951 Presence of aortocoronary bypass graft: Secondary | ICD-10-CM | POA: Insufficient documentation

## 2015-01-29 DIAGNOSIS — Z87438 Personal history of other diseases of male genital organs: Secondary | ICD-10-CM | POA: Diagnosis not present

## 2015-01-29 DIAGNOSIS — Z8739 Personal history of other diseases of the musculoskeletal system and connective tissue: Secondary | ICD-10-CM | POA: Insufficient documentation

## 2015-01-29 DIAGNOSIS — H532 Diplopia: Secondary | ICD-10-CM

## 2015-01-29 DIAGNOSIS — I480 Paroxysmal atrial fibrillation: Secondary | ICD-10-CM | POA: Diagnosis not present

## 2015-01-29 DIAGNOSIS — G459 Transient cerebral ischemic attack, unspecified: Secondary | ICD-10-CM

## 2015-01-29 DIAGNOSIS — E785 Hyperlipidemia, unspecified: Secondary | ICD-10-CM | POA: Insufficient documentation

## 2015-01-29 DIAGNOSIS — Z87442 Personal history of urinary calculi: Secondary | ICD-10-CM | POA: Diagnosis not present

## 2015-01-29 DIAGNOSIS — H538 Other visual disturbances: Secondary | ICD-10-CM | POA: Diagnosis present

## 2015-01-29 LAB — CBC WITH DIFFERENTIAL/PLATELET
BASOS PCT: 1 %
Basophils Absolute: 0 10*3/uL (ref 0.0–0.1)
Eosinophils Absolute: 0.2 10*3/uL (ref 0.0–0.7)
Eosinophils Relative: 3 %
HEMATOCRIT: 43.4 % (ref 39.0–52.0)
HEMOGLOBIN: 14.4 g/dL (ref 13.0–17.0)
Lymphocytes Relative: 31 %
Lymphs Abs: 2 10*3/uL (ref 0.7–4.0)
MCH: 29.8 pg (ref 26.0–34.0)
MCHC: 33.2 g/dL (ref 30.0–36.0)
MCV: 89.7 fL (ref 78.0–100.0)
MONO ABS: 0.6 10*3/uL (ref 0.1–1.0)
MONOS PCT: 9 %
NEUTROS ABS: 3.7 10*3/uL (ref 1.7–7.7)
Neutrophils Relative %: 58 %
Platelets: 177 10*3/uL (ref 150–400)
RBC: 4.84 MIL/uL (ref 4.22–5.81)
RDW: 13.3 % (ref 11.5–15.5)
WBC: 6.5 10*3/uL (ref 4.0–10.5)

## 2015-01-29 LAB — BASIC METABOLIC PANEL
ANION GAP: 7 (ref 5–15)
BUN: 14 mg/dL (ref 6–20)
CALCIUM: 8.9 mg/dL (ref 8.9–10.3)
CO2: 25 mmol/L (ref 22–32)
CREATININE: 0.9 mg/dL (ref 0.61–1.24)
Chloride: 104 mmol/L (ref 101–111)
GFR calc Af Amer: >60 mL/min (ref >60)
GFR calc non Af Amer: >60 mL/min (ref >60)
GLUCOSE: 106 mg/dL — AB (ref 65–99)
Potassium: 3.8 mmol/L (ref 3.5–5.1)
Sodium: 136 mmol/L (ref 135–145)

## 2015-01-29 LAB — PROTIME-INR
INR: 2.09 — AB (ref 0.00–1.49)
PROTHROMBIN TIME: 23.3 s — AB (ref 11.6–15.2)

## 2015-01-29 NOTE — Discharge Instructions (Signed)
Transient Ischemic Attack °A transient ischemic attack (TIA) is a "warning stroke" that causes stroke-like symptoms. Unlike a stroke, a TIA does not cause permanent damage to the brain. The symptoms of a TIA can happen very fast and do not last long. It is important to know the symptoms of a TIA and what to do. This can help prevent a major stroke or death. °CAUSES  °A TIA is caused by a temporary blockage in an artery in the brain or neck (carotid artery). The blockage does not allow the brain to get the blood supply it needs and can cause different symptoms. The blockage can be caused by either: °· A blood clot. °· Fatty buildup (plaque) in a neck or brain artery. °RISK FACTORS °· High blood pressure (hypertension). °· High cholesterol. °· Diabetes mellitus. °· Heart disease. °· The buildup of plaque in the blood vessels (peripheral artery disease or atherosclerosis). °· The buildup of plaque in the blood vessels that provide blood and oxygen to the brain (carotid artery stenosis). °· An abnormal heart rhythm (atrial fibrillation). °· Obesity. °· Using any tobacco products, including cigarettes, chewing tobacco, or electronic cigarettes. °· Taking oral contraceptives, especially in combination with using tobacco. °· Physical inactivity. °· A diet high in fats, salt (sodium), and calories. °· Excessive alcohol use. °· Use of illegal drugs (especially cocaine and methamphetamine). °· Being male. °· Being African American. °· Being over the age of 55 years. °· Family history of stroke. °· Previous history of blood clots, stroke, TIA, or heart attack. °· Sickle cell disease. °SIGNS AND SYMPTOMS  °TIA symptoms are the same as a stroke but are temporary. These symptoms usually develop suddenly, or may be newly present upon waking from sleep: °· Sudden weakness or numbness of the face, arm, or leg, especially on one side of the body. °· Sudden trouble walking or difficulty moving arms or legs. °· Sudden  confusion. °· Sudden personality changes. °· Trouble speaking (aphasia) or understanding. °· Difficulty swallowing. °· Sudden trouble seeing in one or both eyes. °· Double vision. °· Dizziness. °· Loss of balance or coordination. °· Sudden severe headache with no known cause. °· Trouble reading or writing. °· Loss of bowel or bladder control. °· Loss of consciousness. °DIAGNOSIS  °Your health care provider may be able to determine the presence or absence of a TIA based on your symptoms, history, and physical exam. CT scan of the brain is usually performed to help identify a TIA. Other tests may include: °· Electrocardiography (ECG). °· Continuous heart monitoring. °· Echocardiography. °· Carotid ultrasonography. °· MRI. °· A scan of the brain circulation. °· Blood tests. °TREATMENT  °Since the symptoms of TIA are the same as a stroke, it is important to seek treatment as soon as possible. You may need a medicine to dissolve a blood clot (thrombolytic) if that is the cause of the TIA. This medicine cannot be given if too much time has passed. Treatment may also include:  °· Rest, oxygen, fluids through an IV tube, and medicines to thin the blood (anticoagulants). °· Measures will be taken to prevent short-term and long-term complications, including infection from breathing foreign material into the lungs (aspiration pneumonia), blood clots in the legs, and falls. °· Procedures to either remove plaque in the carotid arteries or dilate carotid arteries that have narrowed due to plaque. Those procedures are: °¨ Carotid endarterectomy. °¨ Carotid angioplasty and stenting. °· Medicines and diet may be used to address diabetes, high blood pressure, and   other underlying risk factors. °HOME CARE INSTRUCTIONS  °· Take medicines only as directed by your health care provider. Follow the directions carefully. Medicines may be used to control risk factors for a stroke. Be sure you understand all your medicine instructions. °· You  may be told to take aspirin or the anticoagulant warfarin. Warfarin needs to be taken exactly as instructed. °¨ Taking too much or too little warfarin is dangerous. Too much warfarin increases the risk of bleeding. Too little warfarin continues to allow the risk for blood clots. While taking warfarin, you will need to have regular blood tests to measure your blood clotting time. A PT blood test measures how long it takes for blood to clot. Your PT is used to calculate another value called an INR. Your PT and INR help your health care provider to adjust your dose of warfarin. The dose can change for many reasons. It is critically important that you take warfarin exactly as prescribed. °¨ Many foods, especially foods high in vitamin K can interfere with warfarin and affect the PT and INR. Foods high in vitamin K include spinach, kale, broccoli, cabbage, collard and turnip greens, Brussels sprouts, peas, cauliflower, seaweed, and parsley, as well as beef and pork liver, green tea, and soybean oil. You should eat a consistent amount of foods high in vitamin K. Avoid major changes in your diet, or notify your health care provider before changing your diet. Arrange a visit with a dietitian to answer your questions. °¨ Many medicines can interfere with warfarin and affect the PT and INR. You must tell your health care provider about any and all medicines you take; this includes all vitamins and supplements. Be especially cautious with aspirin and anti-inflammatory medicines. Do not take or discontinue any prescribed or over-the-counter medicine except on the advice of your health care provider or pharmacist. °¨ Warfarin can have side effects, such as excessive bruising or bleeding. You will need to hold pressure over cuts for longer than usual. Your health care provider or pharmacist will discuss other potential side effects. °¨ Avoid sports or activities that may cause injury or bleeding. °¨ Be careful when shaving,  flossing your teeth, or handling sharp objects. °¨ Alcohol can change the body's ability to handle warfarin. It is best to avoid alcoholic drinks or consume only very small amounts while taking warfarin. Notify your health care provider if you change your alcohol intake. °¨ Notify your dentist or other health care providers before procedures. °· Eat a diet that includes 5 or more servings of fruits and vegetables each day. This may reduce the risk of stroke. Certain diets may be prescribed to address high blood pressure, high cholesterol, diabetes, or obesity. °¨ A diet low in sodium, saturated fat, trans fat, and cholesterol is recommended to manage high blood pressure. °¨ A diet low in saturated fat, trans fat, and cholesterol, and high in fiber may control cholesterol levels. °¨ A controlled-carbohydrate, controlled-sugar diet is recommended to manage diabetes. °¨ A reduced-calorie diet that is low in sodium, saturated fat, trans fat, and cholesterol is recommended to manage obesity. °· Maintain a healthy weight. °· Stay physically active. It is recommended that you get at least 30 minutes of activity on most or all days. °· Do not use any tobacco products, including cigarettes, chewing tobacco, or electronic cigarettes. If you need help quitting, ask your health care provider. °· Limit alcohol intake to no more than 1 drink per day for nonpregnant women and 2 drinks   per day for men. One drink equals 12 ounces of beer, 5 ounces of wine, or 1½ ounces of hard liquor. °· Do not abuse drugs. °· A safe home environment is important to reduce the risk of falls. Your health care provider may arrange for specialists to evaluate your home. Having grab bars in the bedroom and bathroom is often important. Your health care provider may arrange for equipment to be used at home, such as raised toilets and a seat for the shower. °· Follow all instructions for follow-up with your health care provider. This is very important.  This includes any referrals and lab tests. Proper follow-up can prevent a stroke or another TIA from occurring. °PREVENTION  °The risk of a TIA can be decreased by appropriately treating high blood pressure, high cholesterol, diabetes, heart disease, and obesity, and by quitting smoking, limiting alcohol, and staying physically active. °SEEK MEDICAL CARE IF: °· You have personality changes. °· You have difficulty swallowing. °· You are seeing double. °· You have dizziness. °· You have a fever. °SEEK IMMEDIATE MEDICAL CARE IF:  °Any of the following symptoms may represent a serious problem that is an emergency. Do not wait to see if the symptoms will go away. Get medical help right away. Call your local emergency services (911 in [place] Do not drive yourself to the hospital. °· You have sudden weakness or numbness of the face, arm, or leg, especially on one side of the body. °· You have sudden trouble walking or difficulty moving arms or legs. °· You have sudden confusion. °· You have trouble speaking (aphasia) or understanding. °· You have sudden trouble seeing in one or both eyes. °· You have a loss of balance or coordination. °· You have a sudden, severe headache with no known cause. °· You have new chest pain or an irregular heartbeat. °· You have a partial or total loss of consciousness. °MAKE SURE YOU:  °· Understand these instructions. °· Will watch your condition. °· Will get help right away if you are not doing well or get worse. °  °This information is not intended to replace advice given to you by your health care provider. Make sure you discuss any questions you have with your health care provider. °  °Document Released: 11/30/2004 Document Revised: 03/13/2014 Document Reviewed: 05/28/2013 °Elsevier Interactive Patient Education ©2016 [employer] ° °

## 2015-01-29 NOTE — ED Notes (Addendum)
Patient states that he was reaching into the fridge when he had sudden onset of bilateral blurred vision and dizziness. Symptoms lasted only a couple minutes, denies difficulty talking, weakness to extremities, headache, or CP. Patient with recent colonoscopy, he is bridging from coumadin to lovenox

## 2015-01-29 NOTE — ED Provider Notes (Signed)
CSN: [id number]     Arrival date & time 01/29/15  1332 History   First MD Initiated Contact with Patient 01/29/15 1715     Chief Complaint  Patient presents with  . Dizziness  . Blurred Vision     (Consider location/radiation/quality/duration/timing/severity/associated sxs/prior Treatment) HPI The patient reports he had an episode of double vision today. He was bending to get something out of his refrigerator and he describes his vision as being "blurred". He states however if he covered either eye independently, the symptom resolved. If he looked with both eyes, the visual change was present. He reports he was feeling lightheaded at the time but does not endorse vertiginous quality. He states he had to sit down. Within that period of time he tried to stand again he again felt very lightheaded. He reports that the double vision lasted throughout this period for about several minutes. No associated headache. No associated other focal neurologic dysfunction. He does report several weeks ago he had an episode whereby he had a temporary visual change for several minutes. At that time again he describes the symptoms extinguishing with covering one or the other of the eyes. The patient does have a history of stroke, he is on Coumadin for paroxysmal H of fibrillation and he has an aortic valve replacement. Past Medical History  Diagnosis Date  . HYPERLIPIDEMIA   . HYPERTENSION   . AORTIC STENOSIS     a. Echo (05/26/13):  Mod LVH, vigorous LVF, EF 65-70%, no RWMA, Gr 1 DD, severe AS (mean 40 mmHg) => s/p bioprosthetic AVR 07/2013  . Former smoker   . DYSPEPSIA   . Palpitations     a. Event monitor (5/13) with occasional PVCs, PACs but no significant arrhythmia.   Marland Kitchen GERD (gastroesophageal reflux disease)   . Trigeminal neuropathy   . Arthritis     BACK  . ED (erectile dysfunction)   . CVA (cerebral infarction)     a. 3/14 right parietal infarct. Carotid US 3/14 showed no significant disease. He  has an implanted loop recorder to look for atrial fibrillation.   . Vertigo   . Sinus bradycardia     a. 2013 - HR 40's in the office. BB stopped.  Marland Kitchen History of kidney stones   . S/P aortic valve replacement with bioprosthetic valve + CABG x1 07/11/2013    23 mm [facility] Ease bovine pericardial tissue valve;  Echo (08/2013):  EF 55-60%, no RWMA, AVR ok, Asc Ao 40 mm (mildly dilated), mild LAE, mild RVE, lipomatous hypertrophy, mod TR, PASP 36 mmHg  . S/P CABG x 1 07/11/2013    SVG to PDA with EVH via right thigh  . CAD (coronary artery disease)     a. Lexiscan myoview (5/13) with EF 70%, no ischemia or infarction;   b. LHC (06/2013):  Dist LM 30%, ostial LAD 40-50%, prox LAD 30%, ostial D1 80-90%, prox CFX 40%, RCA occluded with L-R collats => CABG (S-PDA at time of AVR)  . PAF (paroxysmal atrial fibrillation) ([place])   . Anticoagulated on Coumadin    Past Surgical History  Procedure Laterality Date  . Cataract extraction    . Lithotripsy    . Mouth surgery    . Inguinal hernia repair Right 01/15/2013    Procedure: HERNIA REPAIR INGUINAL ADULT;  Surgeon: Harl Bowie, MD;  Location: [place];  Service: General;  Laterality: Right;  . Insertion of mesh Right 01/15/2013    Procedure: INSERTION OF MESH;  Surgeon: Harl Bowie, MD;  Location: [place];  Service: General;  Laterality: Right;  . Loop recorder implant  04/15/2013    MDT LinQ implanred by Dr Lovena Le for cryptogenic stroke  . Aortic valve replacement N/A 07/11/2013    Procedure: AORTIC VALVE REPLACEMENT (AVR);  Surgeon: [name], MD;  Location: [place];  Service: Open Heart Surgery;  Laterality: N/A;  . Coronary artery bypass graft N/A 07/11/2013    Procedure: CORONARY ARTERY BYPASS GRAFTING (CABG) x1;  Surgeon: [name], MD;  Location: [place];  Service: Open Heart Surgery;  Laterality: N/A;  . Intraoperative transesophageal echocardiogram N/A 07/11/2013    Procedure: INTRAOPERATIVE  TRANSESOPHAGEAL ECHOCARDIOGRAM;  Surgeon: [name], MD;  Location: [place];  Service: Open Heart Surgery;  Laterality: N/A;  . Loop recorder implant N/A 04/14/2013    Procedure: LOOP RECORDER IMPLANT;  Surgeon: Evans Lance, MD;  Location: [facility] CATH LAB;  Service: Cardiovascular;  Laterality: N/A;  . Left and right heart catheterization with coronary angiogram N/A 06/06/2013    Procedure: LEFT AND RIGHT HEART CATHETERIZATION WITH CORONARY ANGIOGRAM;  Surgeon: Larey Dresser, MD;  Location: [facility] CATH LAB;  Service: Cardiovascular;  Laterality: N/A;   Family History  Problem Relation Age of Onset  . Diabetes    . Hypertension    . Lung cancer    . Cancer Sister     breast  . Heart attack Father   . Diabetes Mother   . Heart attack Mother   . Hypertension Father   . Stroke Neg Hx    Social History  Substance Use Topics  . Smoking status: Former Smoker -- 1.00 packs/day for 30 years    Types: Cigarettes    Quit date: 08/29/2009  . Smokeless tobacco: Never Used  . Alcohol Use: No    Review of Systems 10 Systems reviewed and are negative for acute change except as noted in the HPI.    Allergies  Sudafed and Cardizem  Home Medications   Prior to Admission medications   Medication Sig Start Date End Date Taking? Authorizing Provider  acetaminophen (TYLENOL) 500 MG tablet Take 1,000 mg by mouth every 6 (six) hours as needed for mild pain.   Yes Historical Provider, MD  carvedilol (COREG) 3.125 MG tablet Take 3.125 mg by mouth 2 (two) times daily with a meal.   Yes Historical Provider, MD  docusate sodium (COLACE) 100 MG capsule Take 100 mg by mouth daily as needed for mild constipation. Takes as needed about 3 times a week   Yes Historical Provider, MD  enoxaparin (LOVENOX) 100 MG/ML injection Inject 1 mL (100 mg total) into the skin every 12 (twelve) hours. 01/21/15  Yes Larey Dresser, MD  Multiple Vitamin (MULTIVITAMIN WITH MINERALS) TABS tablet Take 1 tablet by mouth as  needed.    Yes Historical Provider, MD  Multiple Vitamins-Minerals (STRESS B COMPLEX/ZINC PO) Take 1 tablet by mouth daily.    Yes Historical Provider, MD  omeprazole (PRILOSEC) 20 MG capsule Take 20 mg by mouth daily as needed (for acid reduction).    Yes Historical Provider, MD  Polyethyl Glycol-Propyl Glycol (SYSTANE OP) Place 1 drop into both eyes daily as needed (for dry eyes).    Yes Historical Provider, MD  rosuvastatin (CRESTOR) 10 MG tablet Take 10 mg by mouth daily.   Yes Historical Provider, MD  warfarin (COUMADIN) 5 MG tablet Take as directed Patient taking differently: Take 5 mg by mouth daily.  Take as directed 05/29/14  Yes Evans Lance, MD  aspirin EC 81 MG EC tablet Take 1 tablet (81 mg total) by mouth daily. Patient not taking: Reported on 01/29/2015 07/15/13   Patrick Jupiter E Gold, PA-C   BP 174/87 mmHg  Pulse 52  Temp(Src) 98.1 F (36.7 C)  Resp 12  Ht 6\' 3"  (1.905 m)  Wt 198 lb (89.812 kg)  BMI 24.75 kg/m2  SpO2 97% Physical Exam  Constitutional: He is oriented to person, place, and time. He appears well-developed and well-nourished.  HENT:  Head: Normocephalic and atraumatic.  Eyes: EOM are normal. Pupils are equal, round, and reactive to light.  Neck: Neck supple.  Cardiovascular: Normal rate, regular rhythm, normal heart sounds and intact distal pulses.   Pulmonary/Chest: Effort normal and breath sounds normal.  Abdominal: Soft. Bowel sounds are normal. He exhibits no distension. There is no tenderness.  Musculoskeletal: Normal range of motion. He exhibits no edema.  Neurological: He is alert and oriented to person, place, and time. He has normal strength. No cranial nerve deficit. He exhibits normal muscle tone. Coordination normal. GCS eye subscore is 4. GCS verbal subscore is 5. GCS motor subscore is 6.  Skin: Skin is warm, dry and intact.  Psychiatric: He has a normal mood and affect.    ED Course  Procedures (including critical care time) Labs Review Labs  Reviewed  BASIC METABOLIC PANEL - Abnormal; Notable for the following:    Glucose, Bld 106 (*)    All other components within normal limits  PROTIME-INR - Abnormal; Notable for the following:    Prothrombin Time 23.3 (*)    INR 2.09 (*)    All other components within normal limits  CBC WITH DIFFERENTIAL/PLATELET    Imaging Review Mr Brain Wo Contrast (neuro Protocol)  01/29/2015  CLINICAL DATA:  Binocular diplopia. Sudden onset of bilateral blurred vision and dizziness lasting a couple of minutes. EXAM: MRI HEAD WITHOUT CONTRAST TECHNIQUE: Multiplanar, multiecho pulse sequences of the brain and surrounding structures were obtained without intravenous contrast. COMPARISON:  05/27/2012 FINDINGS: There is no evidence of acute infarct, intracranial hemorrhage, mass, midline shift, or extra-axial fluid collection. Ventricles and sulci are normal for age. Chronic lacunar infarcts are noted in the right corona radiata (acute on the prior study) and right caudate (unchanged). Dilated perivascular spaces are noted in the left basal ganglia and left thalamus. Prior bilateral cataract extraction is noted. Small mucous retention cysts are again seen in the right maxillary and right sphenoid sinuses. Mastoid air cells are clear. Major intracranial vascular flow voids are preserved. A cyst or effusion is noted at the right atlanto occipital articulation. IMPRESSION: 1. No acute intracranial abnormality. 2. Chronic lacunar infarcts as above. Electronically Signed   By: Logan Bores M.D.   On: 01/29/2015 20:32   I have personally reviewed and evaluated these images and lab results as part of my medical decision-making.   EKG Interpretation   Date/Time:  Friday January 29 2015 14:49:31 EST Ventricular Rate:  60 PR Interval:  222 QRS Duration: 98 QT Interval:  430 QTC Calculation: 430 R Axis:   46 Text Interpretation:  Sinus rhythm with 1st degree A-V block with  Premature supraventricular complexes  Otherwise normal ECG agree. no change  fromold Confirmed by Johnney Killian, MD, Jeannie Done [phone]) on 01/29/2015 8:46:54 PM     Consult: The patient's case was reviewed with Dr. Tressie Ellis. At this time he advises for MRI and if no acute findings, the patient continues his  current management on Coumadin with outpatient follow-up. MDM   Final diagnoses:  Binocular vision disorder with diplopia  Transient cerebral ischemia, unspecified transient cerebral ischemia type   Patient presents with episode of binocular diplopia. Patient has history of stroke and aortic valve replacement. After consultation with neurology, plan was for MRI to evaluate for acute CVA. None is present. At this time patient will continue his Coumadin as he is therapeutic and he has follow-up Monday with Coumadin clinic. He is counseled to follow-up with his family physician and neurologist.    Charlesetta Shanks, MD 01/29/15 2049

## 2015-01-29 NOTE — ED Notes (Signed)
Patient transported to MRI 

## 2015-01-29 NOTE — ED Notes (Signed)
Pt. Stated, i was getting something out of the refrigerator and I got dizzy and had blurred vision The episode lasted a couple of minutes.

## 2015-01-29 NOTE — ED Notes (Signed)
Pt. Left with all belongings and refused wheelchair. Discharge instructions were reviewed and all questions were answered.  

## 2015-01-29 NOTE — ED Notes (Signed)
Patient returned from MRI.

## 2015-02-01 ENCOUNTER — Ambulatory Visit (INDEPENDENT_AMBULATORY_CARE_PROVIDER_SITE_OTHER): Payer: Medicare Other

## 2015-02-01 DIAGNOSIS — Z953 Presence of xenogenic heart valve: Secondary | ICD-10-CM

## 2015-02-01 DIAGNOSIS — I4891 Unspecified atrial fibrillation: Secondary | ICD-10-CM

## 2015-02-01 DIAGNOSIS — Z954 Presence of other heart-valve replacement: Secondary | ICD-10-CM

## 2015-02-01 DIAGNOSIS — I824Y9 Acute embolism and thrombosis of unspecified deep veins of unspecified proximal lower extremity: Secondary | ICD-10-CM

## 2015-02-01 DIAGNOSIS — D6859 Other primary thrombophilia: Secondary | ICD-10-CM | POA: Diagnosis not present

## 2015-02-01 LAB — POCT INR: INR: 2.4

## 2015-02-02 ENCOUNTER — Encounter (HOSPITAL_COMMUNITY): Payer: Self-pay | Admitting: Emergency Medicine

## 2015-02-02 ENCOUNTER — Emergency Department (HOSPITAL_COMMUNITY)
Admission: EM | Admit: 2015-02-02 | Discharge: 2015-02-02 | Disposition: A | Discharge status: Home / Self Care | Payer: Medicare Other | Attending: Emergency Medicine | Admitting: Emergency Medicine

## 2015-02-02 DIAGNOSIS — I2510 Atherosclerotic heart disease of native coronary artery without angina pectoris: Secondary | ICD-10-CM | POA: Insufficient documentation

## 2015-02-02 DIAGNOSIS — Z8669 Personal history of other diseases of the nervous system and sense organs: Secondary | ICD-10-CM | POA: Insufficient documentation

## 2015-02-02 DIAGNOSIS — Z87891 Personal history of nicotine dependence: Secondary | ICD-10-CM | POA: Insufficient documentation

## 2015-02-02 DIAGNOSIS — Z9889 Other specified postprocedural states: Secondary | ICD-10-CM | POA: Insufficient documentation

## 2015-02-02 DIAGNOSIS — E785 Hyperlipidemia, unspecified: Secondary | ICD-10-CM | POA: Diagnosis not present

## 2015-02-02 DIAGNOSIS — R51 Headache: Secondary | ICD-10-CM | POA: Diagnosis not present

## 2015-02-02 DIAGNOSIS — M47819 Spondylosis without myelopathy or radiculopathy, site unspecified: Secondary | ICD-10-CM | POA: Insufficient documentation

## 2015-02-02 DIAGNOSIS — R42 Dizziness and giddiness: Secondary | ICD-10-CM | POA: Diagnosis not present

## 2015-02-02 DIAGNOSIS — I4891 Unspecified atrial fibrillation: Secondary | ICD-10-CM | POA: Insufficient documentation

## 2015-02-02 DIAGNOSIS — I252 Old myocardial infarction: Secondary | ICD-10-CM | POA: Diagnosis not present

## 2015-02-02 DIAGNOSIS — Z79899 Other long term (current) drug therapy: Secondary | ICD-10-CM | POA: Insufficient documentation

## 2015-02-02 DIAGNOSIS — K219 Gastro-esophageal reflux disease without esophagitis: Secondary | ICD-10-CM | POA: Insufficient documentation

## 2015-02-02 DIAGNOSIS — R110 Nausea: Secondary | ICD-10-CM | POA: Diagnosis not present

## 2015-02-02 DIAGNOSIS — Z87438 Personal history of other diseases of male genital organs: Secondary | ICD-10-CM | POA: Insufficient documentation

## 2015-02-02 DIAGNOSIS — I10 Essential (primary) hypertension: Secondary | ICD-10-CM | POA: Insufficient documentation

## 2015-02-02 DIAGNOSIS — Z87442 Personal history of urinary calculi: Secondary | ICD-10-CM | POA: Diagnosis not present

## 2015-02-02 DIAGNOSIS — Z7901 Long term (current) use of anticoagulants: Secondary | ICD-10-CM | POA: Diagnosis not present

## 2015-02-02 DIAGNOSIS — Z7982 Long term (current) use of aspirin: Secondary | ICD-10-CM | POA: Diagnosis not present

## 2015-02-02 DIAGNOSIS — Z951 Presence of aortocoronary bypass graft: Secondary | ICD-10-CM | POA: Diagnosis not present

## 2015-02-02 LAB — BASIC METABOLIC PANEL
ANION GAP: 6 (ref 5–15)
BUN: 13 mg/dL (ref 6–20)
CO2: 26 mmol/L (ref 22–32)
Calcium: 9.1 mg/dL (ref 8.9–10.3)
Chloride: 106 mmol/L (ref 101–111)
Creatinine, Ser: 0.87 mg/dL (ref 0.61–1.24)
GLUCOSE: 115 mg/dL — AB (ref 65–99)
POTASSIUM: 4.2 mmol/L (ref 3.5–5.1)
Sodium: 138 mmol/L (ref 135–145)

## 2015-02-02 LAB — CBC
HEMATOCRIT: 44.7 % (ref 39.0–52.0)
HEMOGLOBIN: 14.9 g/dL (ref 13.0–17.0)
MCH: 29.9 pg (ref 26.0–34.0)
MCHC: 33.3 g/dL (ref 30.0–36.0)
MCV: 89.8 fL (ref 78.0–100.0)
Platelets: 176 10*3/uL (ref 150–400)
RBC: 4.98 MIL/uL (ref 4.22–5.81)
RDW: 13.4 % (ref 11.5–15.5)
WBC: 5.9 10*3/uL (ref 4.0–10.5)

## 2015-02-02 LAB — URINALYSIS, ROUTINE W REFLEX MICROSCOPIC
BILIRUBIN URINE: NEGATIVE
Glucose, UA: NEGATIVE mg/dL
Hgb urine dipstick: NEGATIVE
Ketones, ur: NEGATIVE mg/dL
Leukocytes, UA: NEGATIVE
NITRITE: NEGATIVE
PH: 6 (ref 5.0–8.0)
Protein, ur: NEGATIVE mg/dL
SPECIFIC GRAVITY, URINE: 1.016 (ref 1.005–1.030)

## 2015-02-02 LAB — CBG MONITORING, ED: GLUCOSE-CAPILLARY: 102 mg/dL — AB (ref 65–99)

## 2015-02-02 LAB — PROTIME-INR
INR: 2.13 — AB (ref 0.00–1.49)
PROTHROMBIN TIME: 23.7 s — AB (ref 11.6–15.2)

## 2015-02-02 MED ORDER — ACETAMINOPHEN 325 MG PO TABS
650.0000 mg | ORAL_TABLET | Freq: Once | ORAL | Status: AC
  Administered 2015-02-02: 650 mg via ORAL
  Filled 2015-02-02: qty 2

## 2015-02-02 MED ORDER — MECLIZINE HCL 25 MG PO TABS: 25.0000 mg | ORAL_TABLET | Freq: Three times a day (TID) | ORAL | Status: DC | PRN

## 2015-02-02 MED ORDER — MECLIZINE HCL 25 MG PO TABS
25.0000 mg | ORAL_TABLET | Freq: Once | ORAL | Status: AC
  Administered 2015-02-02: 25 mg via ORAL
  Filled 2015-02-02: qty 1

## 2015-02-02 NOTE — ED Notes (Signed)
Pt states this morning he woke up feeling dizzy. Pt also has "fullness in his head". Pt has equal grip strengths denies any numbness. Speech is clear. No other neuro deficits noted at triage. Pt was seen here on Friday for dizziness and had negative MRI per pt. Pt reports hx of stroke.

## 2015-02-02 NOTE — ED Notes (Signed)
HR of 35 on thee monitor. EDP made aware. Will capture a second EKG if this occurs again.

## 2015-02-02 NOTE — ED Notes (Signed)
Ambulated pt in hallway, tolerated well.

## 2015-02-02 NOTE — Discharge Instructions (Signed)

## 2015-02-02 NOTE — ED Notes (Signed)
Pt CBG, 102. Nurse was notified. 

## 2015-02-02 NOTE — ED Provider Notes (Signed)
CSN: [id number]     Arrival date & time 02/02/15  1050 History   First MD Initiated Contact with Patient 02/02/15 1310     Chief Complaint  Patient presents with  . Dizziness     (Consider location/radiation/quality/duration/timing/severity/associated sxs/prior Treatment) HPI   Sean Brock is a 80 y.o. male with PMH significant for HLD, HTN, aortic stenosis, GERD, CVA, vertigo, sinus bradycardia, PAF on coumadin for anticoagulation who presents with dizziness.  To note, patient was seen 01/29/15 for dizziness and had a negative brain MR.  Patient presents with mild, intermittent dizziness that began this morning.  He reports "it feels like I might lose my balance".  He also describes it as lightheadedness.  He denies vertigo.  Bending over and transitioning from sitting to standing makes it worse.  Lying down and staying still makes it better.  Assoc symptoms include mild headache and nausea.  Denies vomiting, fevers, chills, CP, SOB, abdominal pain, or urinary symptoms.  Past Medical History  Diagnosis Date  . HYPERLIPIDEMIA   . HYPERTENSION   . AORTIC STENOSIS     a. Echo (05/26/13):  Mod LVH, vigorous LVF, EF 65-70%, no RWMA, Gr 1 DD, severe AS (mean 40 mmHg) => s/p bioprosthetic AVR 07/2013  . Former smoker   . DYSPEPSIA   . Palpitations     a. Event monitor (5/13) with occasional PVCs, PACs but no significant arrhythmia.   Marland Kitchen GERD (gastroesophageal reflux disease)   . Trigeminal neuropathy   . Arthritis     BACK  . ED (erectile dysfunction)   . CVA (cerebral infarction)     a. 3/14 right parietal infarct. Carotid US 3/14 showed no significant disease. He has an implanted loop recorder to look for atrial fibrillation.   . Vertigo   . Sinus bradycardia     a. 2013 - HR 40's in the office. BB stopped.  Marland Kitchen History of kidney stones   . S/P aortic valve replacement with bioprosthetic valve + CABG x1 07/11/2013    23 mm [facility] Ease bovine pericardial tissue valve;  Echo  (08/2013):  EF 55-60%, no RWMA, AVR ok, Asc Ao 40 mm (mildly dilated), mild LAE, mild RVE, lipomatous hypertrophy, mod TR, PASP 36 mmHg  . S/P CABG x 1 07/11/2013    SVG to PDA with EVH via right thigh  . CAD (coronary artery disease)     a. Lexiscan myoview (5/13) with EF 70%, no ischemia or infarction;   b. LHC (06/2013):  Dist LM 30%, ostial LAD 40-50%, prox LAD 30%, ostial D1 80-90%, prox CFX 40%, RCA occluded with L-R collats => CABG (S-PDA at time of AVR)  . PAF (paroxysmal atrial fibrillation) ([place])   . Anticoagulated on Coumadin    Past Surgical History  Procedure Laterality Date  . Cataract extraction    . Lithotripsy    . Mouth surgery    . Inguinal hernia repair Right 01/15/2013    Procedure: HERNIA REPAIR INGUINAL ADULT;  Surgeon: Harl Bowie, MD;  Location: [place];  Service: General;  Laterality: Right;  . Insertion of mesh Right 01/15/2013    Procedure: INSERTION OF MESH;  Surgeon: Harl Bowie, MD;  Location: [place];  Service: General;  Laterality: Right;  . Loop recorder implant  04/15/2013    MDT LinQ implanred by Dr Lovena Le for cryptogenic stroke  . Aortic valve replacement N/A 07/11/2013    Procedure: AORTIC VALVE REPLACEMENT (AVR);  Surgeon: [name]  Roxy Manns, MD;  Location: [place];  Service: Open Heart Surgery;  Laterality: N/A;  . Coronary artery bypass graft N/A 07/11/2013    Procedure: CORONARY ARTERY BYPASS GRAFTING (CABG) x1;  Surgeon: [name], MD;  Location: [place];  Service: Open Heart Surgery;  Laterality: N/A;  . Intraoperative transesophageal echocardiogram N/A 07/11/2013    Procedure: INTRAOPERATIVE TRANSESOPHAGEAL ECHOCARDIOGRAM;  Surgeon: [name], MD;  Location: [place];  Service: Open Heart Surgery;  Laterality: N/A;  . Loop recorder implant N/A 04/14/2013    Procedure: LOOP RECORDER IMPLANT;  Surgeon: Evans Lance, MD;  Location: [facility] CATH LAB;  Service: Cardiovascular;  Laterality: N/A;  . Left and right  heart catheterization with coronary angiogram N/A 06/06/2013    Procedure: LEFT AND RIGHT HEART CATHETERIZATION WITH CORONARY ANGIOGRAM;  Surgeon: Larey Dresser, MD;  Location: [facility] CATH LAB;  Service: Cardiovascular;  Laterality: N/A;   Family History  Problem Relation Age of Onset  . Diabetes    . Hypertension    . Lung cancer    . Cancer Sister     breast  . Heart attack Father   . Diabetes Mother   . Heart attack Mother   . Hypertension Father   . Stroke Neg Hx    Social History  Substance Use Topics  . Smoking status: Former Smoker -- 1.00 packs/day for 30 years    Types: Cigarettes    Quit date: 08/29/2009  . Smokeless tobacco: Never Used  . Alcohol Use: No    Review of Systems  All other systems negative unless otherwise stated in HPI   Allergies  Sudafed and Cardizem  Home Medications   Prior to Admission medications   Medication Sig Start Date End Date Taking? Authorizing Provider  aspirin EC 81 MG EC tablet Take 1 tablet (81 mg total) by mouth daily. 07/15/13  Yes Wayne E Gold, PA-C  acetaminophen (TYLENOL) 500 MG tablet Take 1,000 mg by mouth every 6 (six) hours as needed for mild pain.    Historical Provider, MD  carvedilol (COREG) 3.125 MG tablet Take 3.125 mg by mouth 2 (two) times daily with a meal.    Historical Provider, MD  docusate sodium (COLACE) 100 MG capsule Take 100 mg by mouth daily as needed for mild constipation. Takes as needed about 3 times a week    Historical Provider, MD  enoxaparin (LOVENOX) 100 MG/ML injection Inject 1 mL (100 mg total) into the skin every 12 (twelve) hours. 01/21/15   Larey Dresser, MD  meclizine (ANTIVERT) 25 MG tablet Take 1 tablet (25 mg total) by mouth 3 (three) times daily as needed. 02/02/15   Gloriann Loan, PA-C  Multiple Vitamin (MULTIVITAMIN WITH MINERALS) TABS tablet Take 1 tablet by mouth as needed.     Historical Provider, MD  Multiple Vitamins-Minerals (STRESS B COMPLEX/ZINC PO) Take 1 tablet by mouth daily.      Historical Provider, MD  omeprazole (PRILOSEC) 20 MG capsule Take 20 mg by mouth daily as needed (for acid reduction).     Historical Provider, MD  Polyethyl Glycol-Propyl Glycol (SYSTANE OP) Place 1 drop into both eyes daily as needed (for dry eyes).     Historical Provider, MD  rosuvastatin (CRESTOR) 10 MG tablet Take 10 mg by mouth daily.    Historical Provider, MD  warfarin (COUMADIN) 5 MG tablet Take as directed Patient taking differently: Take 5 mg by mouth daily. Take as directed 05/29/14   Evans Lance, MD  BP 155/87 mmHg  Pulse 50  Temp(Src) 97.6 F (36.4 C) (Oral)  Resp 14  Ht 6\' 3"  (1.905 m)  Wt 89.812 kg  BMI 24.75 kg/m2  SpO2 98% Physical Exam  Constitutional: He is oriented to person, place, and time. He appears well-developed and well-nourished.  HENT:  Head: Normocephalic and atraumatic.  Mouth/Throat: Oropharynx is clear and moist.  Eyes: Conjunctivae are normal. Pupils are equal, round, and reactive to light.  Neck: Normal range of motion. Neck supple.  Cardiovascular: Normal rate, regular rhythm and normal heart sounds.   No murmur heard. Pulmonary/Chest: Effort normal and breath sounds normal. No accessory muscle usage or stridor. No respiratory distress. He has no wheezes. He has no rhonchi. He has no rales.  Abdominal: Soft. Bowel sounds are normal. He exhibits no distension. There is no tenderness.  Musculoskeletal: Normal range of motion.  Lymphadenopathy:    He has no cervical adenopathy.  Neurological: He is alert and oriented to person, place, and time.  Speech clear without dysarthria. Mental Status:   AOx3.  Speech clear without dysarthria. Cranial Nerves:  I-not tested  II-PERRLA  III, IV, VI-EOMs intact  V-temporal and masseter strength intact  VII-symmetrical facial movements intact, no facial droop  VIII-hearing grossly intact bilaterally  IX, X-gag intact  XI-strength of sternomastoid and trapezius muscles 5/5  XII-tongue midline Motor:    Good muscle bulk and tone  Strength 5/5 bilaterally in upper and lower extremities   Cerebellar--RAMs, finger to nose intact  Romberg--maintains balance with eyes closed  Casual and tandem gait normal without ataxia  No pronator drift Sensory:  Intact in upper and lower extremities   Skin: Skin is warm and dry.  Psychiatric: He has a normal mood and affect. His behavior is normal.    ED Course  Procedures (including critical care time) Labs Review Labs Reviewed  BASIC METABOLIC PANEL - Abnormal; Notable for the following:    Glucose, Bld 115 (*)    All other components within normal limits  PROTIME-INR - Abnormal; Notable for the following:    Prothrombin Time 23.7 (*)    INR 2.13 (*)    All other components within normal limits  CBG MONITORING, ED - Abnormal; Notable for the following:    Glucose-Capillary 102 (*)    All other components within normal limits  CBC  URINALYSIS, ROUTINE W REFLEX MICROSCOPIC (NOT AT [facility])    Imaging Review No results found. I have personally reviewed and evaluated these images and lab results as part of my medical decision-making.   EKG Interpretation   Date/Time:  Tuesday February 02 2015 11:35:42 EST Ventricular Rate:  61 PR Interval:  218 QRS Duration: 96 QT Interval:  426 QTC Calculation: 428 R Axis:   15 Text Interpretation:  Sinus rhythm with 1st degree A-V block with  Premature ventricular complexes or Fusion complexes Abnormal ECG No  significant change since last tracing Confirmed by KNOTT MD, Quillian Quince  [id number]) on 02/02/2015 2:22:57 PM      MDM   Final diagnoses:  Dizziness    Patient with history of dizziness and vertigo presents with dizziness that began this morning.  No CP or SOB.  VSS, NAD.  On exam, heart RRR, lungs CTAB, abdomen soft and benign.  No focal neurological deficits.  Recent MR negative.  BMP, CBC, UA unremarkable today.  PT/INR therapeutic.  No indication for repeat imaging.  Doubt neurologic  etiology.  EKG unchanged from previous, no acute changes.  Doubt cardiac etiology.  Will give meclizine. Patient ambulatory without difficulty.  Evaluation does not show pathology requring ongoing emergent intervention or admission. Pt is hemodynamically stable and mentating appropriately. Discussed findings/results and plan with patient/guardian, who agrees with plan. All questions answered. Return precautions discussed and outpatient follow up given.   Case has been discussed with and seen by Dr. Laneta Simmers who agrees with the above plan for discharge.     Gloriann Loan, PA-C 02/02/15 1549  Leo Grosser, MD 02/05/15 [phone]

## 2015-02-07 LAB — CUP PACEART REMOTE DEVICE CHECK: Date Time Interrogation Session: 20161107070622

## 2015-02-07 NOTE — Progress Notes (Signed)
Carelink summary report received. Battery status OK. Normal device function. No new symptom episodes, tachy episodes, brady, or pause episodes. 2 AF episodes, ECGs suggest artifact and SR w/PACs, +warfarin. Monthly summary reports and ROV with GT in 04/2015.

## 2015-02-08 ENCOUNTER — Encounter: Payer: Self-pay | Admitting: Nurse Practitioner

## 2015-02-09 ENCOUNTER — Encounter: Payer: Self-pay | Admitting: Cardiology

## 2015-02-10 ENCOUNTER — Ambulatory Visit (INDEPENDENT_AMBULATORY_CARE_PROVIDER_SITE_OTHER): Payer: Medicare Other | Admitting: *Deleted

## 2015-02-10 DIAGNOSIS — I639 Cerebral infarction, unspecified: Secondary | ICD-10-CM | POA: Diagnosis not present

## 2015-02-10 NOTE — Progress Notes (Signed)
Carelink Summary Report / Loop Recorder 

## 2015-02-15 ENCOUNTER — Ambulatory Visit (INDEPENDENT_AMBULATORY_CARE_PROVIDER_SITE_OTHER): Payer: Medicare Other | Admitting: *Deleted

## 2015-02-15 DIAGNOSIS — Z953 Presence of xenogenic heart valve: Secondary | ICD-10-CM

## 2015-02-15 DIAGNOSIS — I824Y9 Acute embolism and thrombosis of unspecified deep veins of unspecified proximal lower extremity: Secondary | ICD-10-CM | POA: Diagnosis not present

## 2015-02-15 DIAGNOSIS — D6859 Other primary thrombophilia: Secondary | ICD-10-CM | POA: Diagnosis not present

## 2015-02-15 DIAGNOSIS — Z954 Presence of other heart-valve replacement: Secondary | ICD-10-CM

## 2015-02-15 DIAGNOSIS — I4891 Unspecified atrial fibrillation: Secondary | ICD-10-CM

## 2015-02-15 LAB — POCT INR: INR: 2

## 2015-03-09 ENCOUNTER — Ambulatory Visit (INDEPENDENT_AMBULATORY_CARE_PROVIDER_SITE_OTHER): Payer: Medicare Other | Admitting: Surgery

## 2015-03-09 DIAGNOSIS — Z954 Presence of other heart-valve replacement: Secondary | ICD-10-CM | POA: Diagnosis not present

## 2015-03-09 DIAGNOSIS — D6859 Other primary thrombophilia: Secondary | ICD-10-CM

## 2015-03-09 DIAGNOSIS — I4891 Unspecified atrial fibrillation: Secondary | ICD-10-CM | POA: Diagnosis not present

## 2015-03-09 DIAGNOSIS — I824Y9 Acute embolism and thrombosis of unspecified deep veins of unspecified proximal lower extremity: Secondary | ICD-10-CM | POA: Diagnosis not present

## 2015-03-09 DIAGNOSIS — Z953 Presence of xenogenic heart valve: Secondary | ICD-10-CM

## 2015-03-09 LAB — POCT INR: INR: 3

## 2015-03-12 ENCOUNTER — Ambulatory Visit (INDEPENDENT_AMBULATORY_CARE_PROVIDER_SITE_OTHER): Payer: Medicare Other | Admitting: *Deleted

## 2015-03-12 DIAGNOSIS — I6389 Other cerebral infarction: Secondary | ICD-10-CM

## 2015-03-12 DIAGNOSIS — I638 Other cerebral infarction: Secondary | ICD-10-CM | POA: Diagnosis not present

## 2015-03-12 NOTE — Progress Notes (Signed)
Carelink Summary Report / Loop Recorder 

## 2015-04-06 ENCOUNTER — Ambulatory Visit (INDEPENDENT_AMBULATORY_CARE_PROVIDER_SITE_OTHER): Payer: Medicare Other | Admitting: *Deleted

## 2015-04-06 DIAGNOSIS — I824Y9 Acute embolism and thrombosis of unspecified deep veins of unspecified proximal lower extremity: Secondary | ICD-10-CM

## 2015-04-06 DIAGNOSIS — Z954 Presence of other heart-valve replacement: Secondary | ICD-10-CM

## 2015-04-06 DIAGNOSIS — I4891 Unspecified atrial fibrillation: Secondary | ICD-10-CM

## 2015-04-06 DIAGNOSIS — D6859 Other primary thrombophilia: Secondary | ICD-10-CM | POA: Diagnosis not present

## 2015-04-06 DIAGNOSIS — Z953 Presence of xenogenic heart valve: Secondary | ICD-10-CM

## 2015-04-06 LAB — CUP PACEART REMOTE DEVICE CHECK: MDC IDC SESS DTM: 20161207073543

## 2015-04-06 LAB — POCT INR: INR: 3.4

## 2015-04-12 ENCOUNTER — Ambulatory Visit (INDEPENDENT_AMBULATORY_CARE_PROVIDER_SITE_OTHER): Payer: Medicare Other | Admitting: *Deleted

## 2015-04-12 DIAGNOSIS — I638 Other cerebral infarction: Secondary | ICD-10-CM | POA: Diagnosis not present

## 2015-04-12 DIAGNOSIS — I6389 Other cerebral infarction: Secondary | ICD-10-CM

## 2015-04-13 NOTE — Progress Notes (Signed)
Carelink Summary Report / Loop Recorder 

## 2015-04-23 LAB — CUP PACEART REMOTE DEVICE CHECK: MDC IDC SESS DTM: 20170106073550

## 2015-04-23 NOTE — Progress Notes (Signed)
Carelink summary report received. Battery status OK. Normal device function. No new symptom episodes, tachy episodes, brady, or pause episodes. 1 AF episode, SR with ectopy. Monthly summary reports and ROV/PRN

## 2015-04-28 ENCOUNTER — Other Ambulatory Visit: Payer: Self-pay

## 2015-04-29 ENCOUNTER — Ambulatory Visit (INDEPENDENT_AMBULATORY_CARE_PROVIDER_SITE_OTHER): Payer: Medicare Other | Admitting: *Deleted

## 2015-04-29 ENCOUNTER — Encounter: Payer: Self-pay | Admitting: Internal Medicine

## 2015-04-29 ENCOUNTER — Ambulatory Visit (INDEPENDENT_AMBULATORY_CARE_PROVIDER_SITE_OTHER): Payer: Medicare Other | Admitting: Internal Medicine

## 2015-04-29 VITALS — BP 112/68 | HR 58 | Ht 75.0 in | Wt 201.0 lb

## 2015-04-29 DIAGNOSIS — Z954 Presence of other heart-valve replacement: Secondary | ICD-10-CM

## 2015-04-29 DIAGNOSIS — Z953 Presence of xenogenic heart valve: Secondary | ICD-10-CM

## 2015-04-29 DIAGNOSIS — D6859 Other primary thrombophilia: Secondary | ICD-10-CM

## 2015-04-29 DIAGNOSIS — I4891 Unspecified atrial fibrillation: Secondary | ICD-10-CM | POA: Diagnosis not present

## 2015-04-29 DIAGNOSIS — I480 Paroxysmal atrial fibrillation: Secondary | ICD-10-CM | POA: Diagnosis not present

## 2015-04-29 DIAGNOSIS — I824Y9 Acute embolism and thrombosis of unspecified deep veins of unspecified proximal lower extremity: Secondary | ICD-10-CM

## 2015-04-29 LAB — CUP PACEART INCLINIC DEVICE CHECK: MDC IDC SESS DTM: 20170223135844

## 2015-04-29 LAB — POCT INR: INR: 2.3

## 2015-04-29 NOTE — Progress Notes (Signed)
HPI [name] returns today for followup. He is a 81 yo man with a h/o cryptogenic stroke, aortic stenosis, s/p replacement, s/p ILR insertion. In the interim he has done well with no chest pressure or syncope. He has been found to have atrial fib and has been placed on warfarin. He had a TIA with transition from warfarin to lovenox and back around the time of a colonscopy 2 months ago.  He denies sob with exertion. No chest pain. Allergies  Allergen Reactions  . Sudafed [Pseudoephedrine Hcl] Palpitations  . Cardizem [Diltiazem Hcl] Rash     Current Outpatient Prescriptions  Medication Sig Dispense Refill  . acetaminophen (TYLENOL) 500 MG tablet Take 1,000 mg by mouth every 6 (six) hours as needed for mild pain.    Marland Kitchen aspirin EC 81 MG EC tablet Take 1 tablet (81 mg total) by mouth daily.    . carvedilol (COREG) 3.125 MG tablet Take 3.125 mg by mouth 2 (two) times daily with a meal.    . docusate sodium (COLACE) 100 MG capsule Take 100 mg by mouth daily as needed for mild constipation. Takes as needed about 3 times a week    . meclizine (ANTIVERT) 25 MG tablet Take 25 mg by mouth 3 (three) times daily as needed for dizziness.    . Multiple Vitamin (MULTIVITAMIN WITH MINERALS) TABS tablet Take 1 tablet by mouth daily as needed (vitamin supplement).     . Multiple Vitamins-Minerals (STRESS B COMPLEX/ZINC PO) Take 1 tablet by mouth daily.     Marland Kitchen omeprazole (PRILOSEC) 20 MG capsule Take 20 mg by mouth daily as needed (for acid reduction).     Vladimir Faster Glycol-Propyl Glycol (SYSTANE OP) Place 1 drop into both eyes daily as needed (for dry eyes).     . rosuvastatin (CRESTOR) 10 MG tablet Take 10 mg by mouth daily.    . tamsulosin (FLOMAX) 0.4 MG CAPS capsule Take 0.4 mg by mouth as directed.    . warfarin (COUMADIN) 5 MG tablet Take as directed (Patient taking differently: Take 5 mg by mouth daily. Take as directed) 90 tablet 3   No current facility-administered medications for this  visit.     Past Medical History  Diagnosis Date  . HYPERLIPIDEMIA   . HYPERTENSION   . AORTIC STENOSIS     a. Echo (05/26/13):  Mod LVH, vigorous LVF, EF 65-70%, no RWMA, Gr 1 DD, severe AS (mean 40 mmHg) => s/p bioprosthetic AVR 07/2013  . Former smoker   . DYSPEPSIA   . Palpitations     a. Event monitor (5/13) with occasional PVCs, PACs but no significant arrhythmia.   Marland Kitchen GERD (gastroesophageal reflux disease)   . Trigeminal neuropathy   . Arthritis     BACK  . ED (erectile dysfunction)   . CVA (cerebral infarction)     a. 3/14 right parietal infarct. Carotid US 3/14 showed no significant disease. He has an implanted loop recorder to look for atrial fibrillation.   . Vertigo   . Sinus bradycardia     a. 2013 - HR 40's in the office. BB stopped.  Marland Kitchen History of kidney stones   . S/P aortic valve replacement with bioprosthetic valve + CABG x1 07/11/2013    23 mm [facility] Ease bovine pericardial tissue valve;  Echo (08/2013):  EF 55-60%, no RWMA, AVR ok, Asc Ao 40 mm (mildly dilated), mild LAE, mild RVE, lipomatous hypertrophy, mod TR, PASP 36 mmHg  . S/P  CABG x 1 07/11/2013    SVG to PDA with EVH via right thigh  . CAD (coronary artery disease)     a. Lexiscan myoview (5/13) with EF 70%, no ischemia or infarction;   b. LHC (06/2013):  Dist LM 30%, ostial LAD 40-50%, prox LAD 30%, ostial D1 80-90%, prox CFX 40%, RCA occluded with L-R collats => CABG (S-PDA at time of AVR)    ROS:   All systems reviewed and negative except as noted in the HPI.   Past Surgical History  Procedure Laterality Date  . Cataract extraction    . Lithotripsy    . Mouth surgery    . Inguinal hernia repair Right 01/15/2013    Procedure: HERNIA REPAIR INGUINAL ADULT;  Surgeon: Harl Bowie, MD;  Location: [place];  Service: General;  Laterality: Right;  . Insertion of mesh Right 01/15/2013    Procedure: INSERTION OF MESH;  Surgeon: Harl Bowie, MD;  Location: [place];  Service: General;  Laterality: Right;  . Loop recorder implant  04/15/2013    MDT LinQ implanred by Dr Lovena Le for cryptogenic stroke  . Aortic valve replacement N/A 07/11/2013    Procedure: AORTIC VALVE REPLACEMENT (AVR);  Surgeon: [name], MD;  Location: [place];  Service: Open Heart Surgery;  Laterality: N/A;  . Coronary artery bypass graft N/A 07/11/2013    Procedure: CORONARY ARTERY BYPASS GRAFTING (CABG) x1;  Surgeon: [name], MD;  Location: [place];  Service: Open Heart Surgery;  Laterality: N/A;  . Intraoperative transesophageal echocardiogram N/A 07/11/2013    Procedure: INTRAOPERATIVE TRANSESOPHAGEAL ECHOCARDIOGRAM;  Surgeon: [name], MD;  Location: [place];  Service: Open Heart Surgery;  Laterality: N/A;  . Loop recorder implant N/A 04/14/2013    Procedure: LOOP RECORDER IMPLANT;  Surgeon: Evans Lance, MD;  Location[facility] CATH LAB;  Service: Cardiovascular;  Laterality: N/A;  . Left and right heart catheterization with coronary angiogram N/A 06/06/2013    Procedure: LEFT AND RIGHT HEART CATHETERIZATION WITH CORONARY ANGIOGRAM;  Surgeon: Larey Dresser, MD;  Location: [facility] CATH LAB;  Service: Cardiovascular;  Laterality: N/A;     Family History  Problem Relation Age of Onset  . Diabetes    . Hypertension    . Lung cancer    . Cancer Sister     breast  . Heart attack Father   . Diabetes Mother   . Heart attack Mother   . Hypertension Father   . Stroke Neg Hx      Social History   Social History  . Marital Status: Married    Spouse Name: N/A  . Number of Children: N/A  . Years of Education: N/A   Occupational History  . Not on file.   Social History Main Topics  . Smoking status: Former Smoker -- 1.00 packs/day for 30 years    Types: Cigarettes    Quit date: 08/29/2009  . Smokeless tobacco: Never Used  . Alcohol Use: No  . Drug Use: No  . Sexual Activity: Not Currently   Other Topics Concern  . Not on file   Social History Narrative    He resides in [place] with his wife and son. 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  No    grandchildren. He smokes half a pack per day intermittently for the last 40    years. He drinks a glass  of homemade wine mixed with diet Coke one time per    week. He denies any drugs, herbal medication, diet, or exercise program.           BP 112/68 mmHg  Pulse 58  Ht 6\' 3"  (1.905 m)  Wt 201 lb (91.173 kg)  BMI 25.12 kg/m2  Physical Exam:  Well appearing 81 yo man, NAD HEENT: Unremarkable Neck:  6 cm JVD, no thyromegally Back:  No CVA tenderness Lungs:  Clear with no wheezes HEART:  Regular rate rhythm, no murmurs, no rubs, no clicks Abd:  soft, positive bowel sounds, no organomegally, no rebound, no guarding Ext:  2 plus pulses, no edema, no cyanosis, no clubbing Skin:  No rashes no nodules Neuro:  CN II through XII intact, motor grossly intact   DEVICE  Normal device function.  See PaceArt for details. Normal ILR function. Atrial fib is present.  Assess/Plan: 1. Stroke - he appears to have had atrial fib and is now on anti-coagulation. 2. Atrial fib - he is minimally symptomatic and his burden of atrial fib is low. 3. HTN - his blood pressure is well controlled. Will follow. 4. Bradycardia - interogation of his ILR demonstrates pauses but he does not appear to be symptomatic. I have warned him to call us if he has syncope.  Mikle Bosworth.D.

## 2015-04-29 NOTE — Patient Instructions (Signed)
Medication Instructions:  Your physician recommends that you continue on your current medications as directed. Please refer to the Current Medication list given to you today.   Labwork: None Ordered   Testing/Procedures: None Ordered   Follow-Up: Your physician wants you to follow-up in: 1 year with Dr. Taylor. You will receive a reminder letter in the mail two months in advance. If you don't receive a letter, please call our office to schedule the follow-up appointment.  If you need a refill on your cardiac medications before your next appointment, please call your pharmacy.   

## 2015-05-11 ENCOUNTER — Ambulatory Visit (INDEPENDENT_AMBULATORY_CARE_PROVIDER_SITE_OTHER): Payer: Medicare Other | Admitting: *Deleted

## 2015-05-11 DIAGNOSIS — I638 Other cerebral infarction: Secondary | ICD-10-CM

## 2015-05-11 DIAGNOSIS — I6389 Other cerebral infarction: Secondary | ICD-10-CM

## 2015-05-11 LAB — CUP PACEART REMOTE DEVICE CHECK: Date Time Interrogation Session: 20170205073523

## 2015-05-11 NOTE — Progress Notes (Signed)
Carelink summary report received. Battery status OK. Normal device function. No new symptom episodes, tachy episodes, brady, or pause episodes. No new AF episodes. Monthly summary reports and ROV/PRN 

## 2015-05-14 NOTE — Progress Notes (Signed)
Carelink Summary Report / Loop Recorder 

## 2015-05-15 LAB — CUP PACEART REMOTE DEVICE CHECK: MDC IDC SESS DTM: 20170307080541

## 2015-05-15 NOTE — Progress Notes (Signed)
Carelink summary report received. Battery status OK. Normal device function. No new symptom episodes, tachy episodes, brady, or pause episodes. 2 AF episodes, +Warfarin. Monthly summary reports and ROV/PRN

## 2015-05-27 ENCOUNTER — Ambulatory Visit (INDEPENDENT_AMBULATORY_CARE_PROVIDER_SITE_OTHER): Payer: Medicare Other | Admitting: *Deleted

## 2015-05-27 DIAGNOSIS — I824Y9 Acute embolism and thrombosis of unspecified deep veins of unspecified proximal lower extremity: Secondary | ICD-10-CM | POA: Diagnosis not present

## 2015-05-27 DIAGNOSIS — I480 Paroxysmal atrial fibrillation: Secondary | ICD-10-CM | POA: Diagnosis not present

## 2015-05-27 DIAGNOSIS — I4891 Unspecified atrial fibrillation: Secondary | ICD-10-CM

## 2015-05-27 DIAGNOSIS — Z954 Presence of other heart-valve replacement: Secondary | ICD-10-CM

## 2015-05-27 DIAGNOSIS — D6859 Other primary thrombophilia: Secondary | ICD-10-CM | POA: Diagnosis not present

## 2015-05-27 DIAGNOSIS — Z953 Presence of xenogenic heart valve: Secondary | ICD-10-CM

## 2015-05-27 LAB — POCT INR: INR: 2.6

## 2015-06-10 ENCOUNTER — Ambulatory Visit (INDEPENDENT_AMBULATORY_CARE_PROVIDER_SITE_OTHER): Payer: Medicare Other | Admitting: *Deleted

## 2015-06-10 DIAGNOSIS — I639 Cerebral infarction, unspecified: Secondary | ICD-10-CM

## 2015-06-10 NOTE — Progress Notes (Signed)
Carelink Summary Report / Loop Recorder 

## 2015-07-01 ENCOUNTER — Ambulatory Visit (INDEPENDENT_AMBULATORY_CARE_PROVIDER_SITE_OTHER): Payer: Medicare Other | Admitting: *Deleted

## 2015-07-01 DIAGNOSIS — I824Y9 Acute embolism and thrombosis of unspecified deep veins of unspecified proximal lower extremity: Secondary | ICD-10-CM

## 2015-07-01 DIAGNOSIS — Z954 Presence of other heart-valve replacement: Secondary | ICD-10-CM

## 2015-07-01 DIAGNOSIS — Z953 Presence of xenogenic heart valve: Secondary | ICD-10-CM

## 2015-07-01 DIAGNOSIS — D6859 Other primary thrombophilia: Secondary | ICD-10-CM

## 2015-07-01 DIAGNOSIS — I4891 Unspecified atrial fibrillation: Secondary | ICD-10-CM | POA: Diagnosis not present

## 2015-07-01 DIAGNOSIS — I480 Paroxysmal atrial fibrillation: Secondary | ICD-10-CM | POA: Diagnosis not present

## 2015-07-01 LAB — POCT INR: INR: 3.8

## 2015-07-12 ENCOUNTER — Ambulatory Visit (INDEPENDENT_AMBULATORY_CARE_PROVIDER_SITE_OTHER): Payer: Medicare Other | Admitting: *Deleted

## 2015-07-12 DIAGNOSIS — I639 Cerebral infarction, unspecified: Secondary | ICD-10-CM | POA: Diagnosis not present

## 2015-07-13 ENCOUNTER — Other Ambulatory Visit: Payer: Self-pay | Admitting: Internal Medicine

## 2015-07-13 NOTE — Progress Notes (Signed)
Carelink Summary Report / Loop Recorder 

## 2015-07-19 ENCOUNTER — Ambulatory Visit (INDEPENDENT_AMBULATORY_CARE_PROVIDER_SITE_OTHER): Payer: Medicare Other | Admitting: *Deleted

## 2015-07-19 DIAGNOSIS — D6859 Other primary thrombophilia: Secondary | ICD-10-CM

## 2015-07-19 DIAGNOSIS — I824Y9 Acute embolism and thrombosis of unspecified deep veins of unspecified proximal lower extremity: Secondary | ICD-10-CM | POA: Diagnosis not present

## 2015-07-19 DIAGNOSIS — I4891 Unspecified atrial fibrillation: Secondary | ICD-10-CM

## 2015-07-19 DIAGNOSIS — Z954 Presence of other heart-valve replacement: Secondary | ICD-10-CM

## 2015-07-19 DIAGNOSIS — I480 Paroxysmal atrial fibrillation: Secondary | ICD-10-CM | POA: Diagnosis not present

## 2015-07-19 DIAGNOSIS — Z953 Presence of xenogenic heart valve: Secondary | ICD-10-CM

## 2015-07-19 LAB — POCT INR: INR: 2

## 2015-07-31 LAB — CUP PACEART REMOTE DEVICE CHECK: MDC IDC SESS DTM: 20170406080536

## 2015-07-31 IMAGING — CT CT ANGIO CHEST
2 of 5 series · 9 of 30 positions shown · IV contrast (80CC OMNI 350)
Comparison: Prior chest x-ray 06/12/2013

CLINICAL DATA: Evaluate thoracic aortic aneurysm

EXAM:
CT ANGIOGRAPHY CHEST WITH CONTRAST
TECHNIQUE: Multidetector CT imaging of the chest was performed using the
standard protocol during bolus administration of intravenous
contrast. Multiplanar CT image reconstructions and MIPs were
obtained to evaluate the vascular anatomy.
CONTRAST:  80mL OMNIPAQUE IOHEXOL 350 MG/ML SOLN

[Series 4: angio · axial · 0.70mm/px · z∈[-284,-26]mm · 5 of 155 slices shown]
[im 26/155  lung]
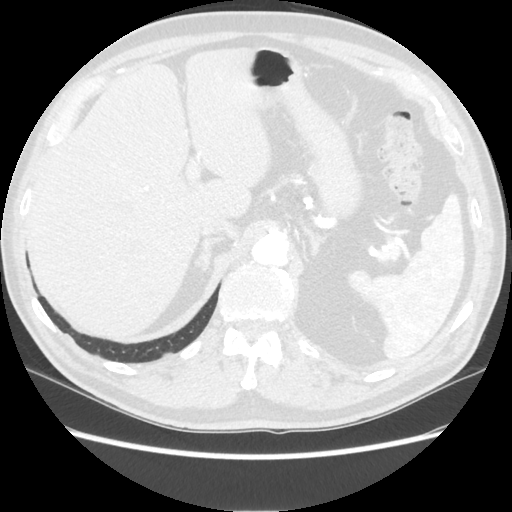
[im 52/155  mediastinal]
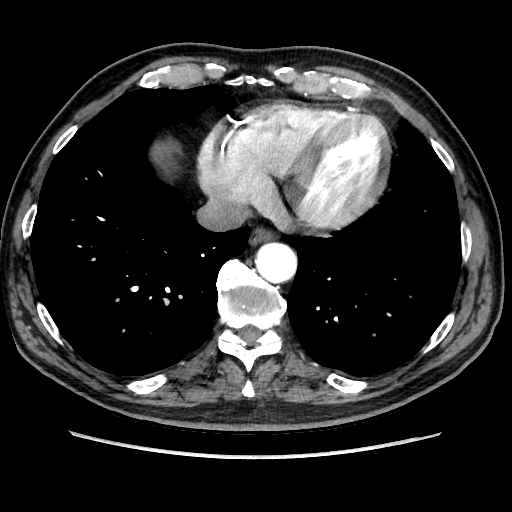
[im 78/155  lung]
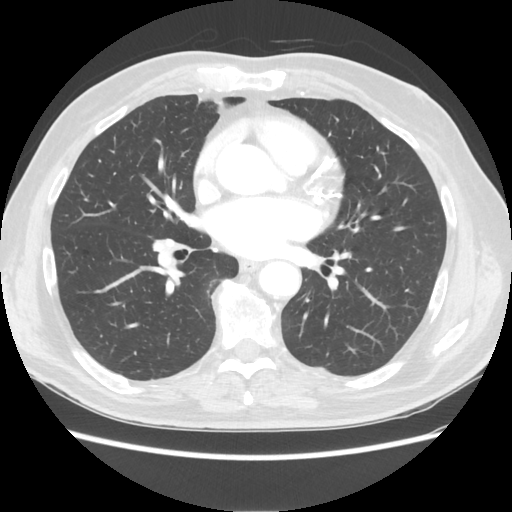
[im 103/155  mediastinal]
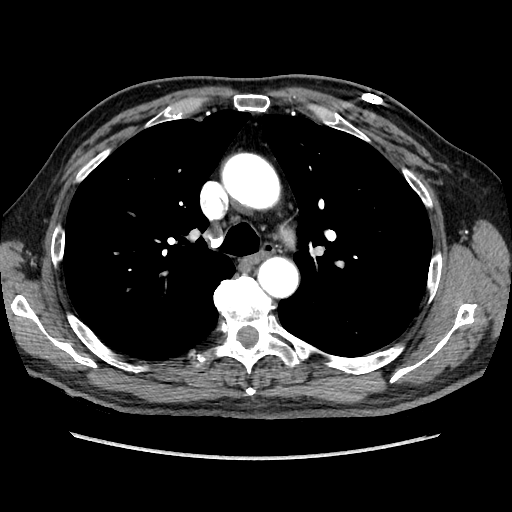
[im 129/155  lung]
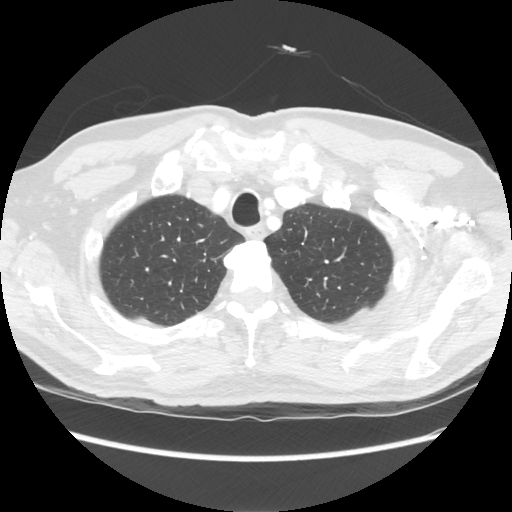

[Series 602: sagittal body · sagittal · 0.75mm/px · 4 of 145 slices shown]
[im 29/145  lung]
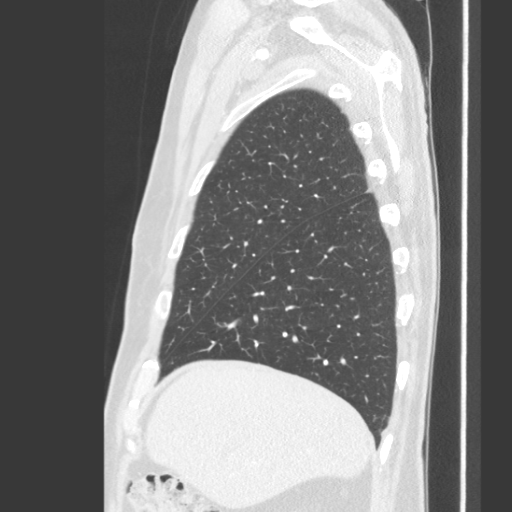
[im 58/145  lung]
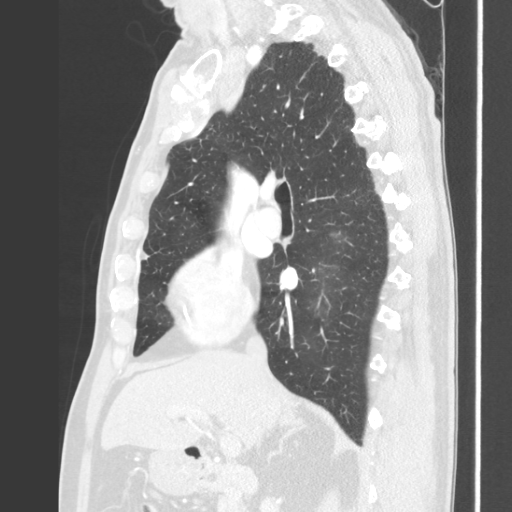
[im 87/145  lung]
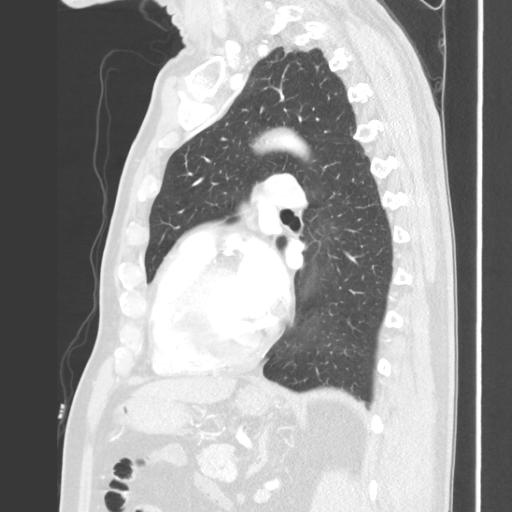
[im 116/145  lung]
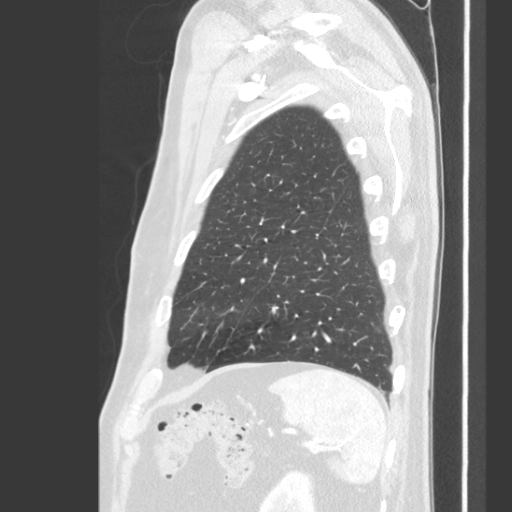

[9 of 30 positions shown; findings below may reference images not displayed]

FINDINGS: Mediastinum: Tiny 3 mm low-attenuation nodule in the left thyroid
gland is likely clinically insignificant no mediastinal mass or
suspicious adenopathy. Unremarkable thoracic esophagus.

Heart/Vascular: Normal caliber aorta. There is a borderline ectasia
of the ascending aortic segment with a maximal transverse diameter
of 3.8 cm. The aortic root remains within normal limits at 3.4 cm in
diameter. No effacement of the Devora junction. Four vessel
aortic arch configuration. The left vertebral artery arises directly
from the arch. Atherosclerotic vascular calcifications without
evidence of significant stenosis. Normal caliber main and central
pulmonary arteries. No definite central pulmonary embolus. The heart
is enlarged. Atherosclerotic calcifications noted in the left
anterior descending, circumflex and right coronary arteries. The
aortic valve is thickened and partially calcified. No pericardial
effusion.

Lungs/Pleura: The lungs are clear.  No pleural effusion.

Bones/Soft Tissues: No acute fracture or aggressive appearing lytic
or blastic osseous lesion. Degenerative osteoarthritis of the
bilateral glenohumeral joints noted incidentally.

Upper Abdomen: Visualized upper abdominal organs are unremarkable.

Review of the MIP images confirms the above findings.
IMPRESSION: 1. Ectasia of the ascending thoracic aorta to a maximal diameter of
3.8 cm.
2. Thickened and calcified aortic valve. If there is clinical
concern for underlying aortic valvular pathology (stenosis or
incompetence, echocardiography could further evaluate.
3. Atherosclerosis including multivessel coronary artery disease.
Please note that although the presence of coronary artery calcium
documents the presence of coronary artery disease, the severity of
this disease and any potential stenosis cannot be assessed on this
non-gated CT examination. Assessment for potential risk factor
modification, dietary therapy or pharmacologic therapy may be
warranted, if clinically indicated.
4. Four vessel aortic arch configuration. The left vertebral artery
arises directly from the aorta.

## 2015-07-31 NOTE — Progress Notes (Signed)
Carelink summary report received. Battery status OK. Normal device function. No new symptom episodes, tachy episodes, brady, or pause episodes. 9 AF episodes, <0.1%, +Eliquis, v rates controlled. Monthly summary reports and ROV/PRN

## 2015-08-09 ENCOUNTER — Ambulatory Visit (INDEPENDENT_AMBULATORY_CARE_PROVIDER_SITE_OTHER): Payer: Medicare Other | Admitting: *Deleted

## 2015-08-09 DIAGNOSIS — D6859 Other primary thrombophilia: Secondary | ICD-10-CM

## 2015-08-09 DIAGNOSIS — I4891 Unspecified atrial fibrillation: Secondary | ICD-10-CM

## 2015-08-09 DIAGNOSIS — I638 Other cerebral infarction: Secondary | ICD-10-CM | POA: Diagnosis not present

## 2015-08-09 DIAGNOSIS — Z954 Presence of other heart-valve replacement: Secondary | ICD-10-CM

## 2015-08-09 DIAGNOSIS — I824Y9 Acute embolism and thrombosis of unspecified deep veins of unspecified proximal lower extremity: Secondary | ICD-10-CM | POA: Diagnosis not present

## 2015-08-09 DIAGNOSIS — Z953 Presence of xenogenic heart valve: Secondary | ICD-10-CM

## 2015-08-09 DIAGNOSIS — I480 Paroxysmal atrial fibrillation: Secondary | ICD-10-CM | POA: Diagnosis not present

## 2015-08-09 DIAGNOSIS — I6389 Other cerebral infarction: Secondary | ICD-10-CM

## 2015-08-09 LAB — POCT INR: INR: 2.3

## 2015-08-09 NOTE — Progress Notes (Signed)
Carelink Summary Report / Loop Recorder 

## 2015-08-19 LAB — CUP PACEART REMOTE DEVICE CHECK: MDC IDC SESS DTM: 20170506083544

## 2015-09-06 ENCOUNTER — Ambulatory Visit (INDEPENDENT_AMBULATORY_CARE_PROVIDER_SITE_OTHER): Payer: Medicare Other | Admitting: *Deleted

## 2015-09-06 DIAGNOSIS — I4891 Unspecified atrial fibrillation: Secondary | ICD-10-CM

## 2015-09-06 DIAGNOSIS — I824Y9 Acute embolism and thrombosis of unspecified deep veins of unspecified proximal lower extremity: Secondary | ICD-10-CM

## 2015-09-06 DIAGNOSIS — I480 Paroxysmal atrial fibrillation: Secondary | ICD-10-CM | POA: Diagnosis not present

## 2015-09-06 DIAGNOSIS — Z953 Presence of xenogenic heart valve: Secondary | ICD-10-CM

## 2015-09-06 DIAGNOSIS — D6859 Other primary thrombophilia: Secondary | ICD-10-CM | POA: Diagnosis not present

## 2015-09-06 DIAGNOSIS — Z954 Presence of other heart-valve replacement: Secondary | ICD-10-CM

## 2015-09-06 LAB — POCT INR: INR: 2.3

## 2015-09-08 ENCOUNTER — Ambulatory Visit (INDEPENDENT_AMBULATORY_CARE_PROVIDER_SITE_OTHER): Payer: Medicare Other | Admitting: *Deleted

## 2015-09-08 DIAGNOSIS — I638 Other cerebral infarction: Secondary | ICD-10-CM | POA: Diagnosis not present

## 2015-09-08 DIAGNOSIS — I6389 Other cerebral infarction: Secondary | ICD-10-CM

## 2015-09-08 NOTE — Progress Notes (Signed)
Carelink Summary Report / Loop Recorder 

## 2015-09-16 LAB — CUP PACEART REMOTE DEVICE CHECK: MDC IDC SESS DTM: 20170605083638

## 2015-09-24 LAB — CUP PACEART REMOTE DEVICE CHECK: MDC IDC SESS DTM: 20170705090828

## 2015-10-04 ENCOUNTER — Ambulatory Visit (INDEPENDENT_AMBULATORY_CARE_PROVIDER_SITE_OTHER): Payer: Medicare Other | Admitting: *Deleted

## 2015-10-04 DIAGNOSIS — D6859 Other primary thrombophilia: Secondary | ICD-10-CM | POA: Diagnosis not present

## 2015-10-04 DIAGNOSIS — I824Y9 Acute embolism and thrombosis of unspecified deep veins of unspecified proximal lower extremity: Secondary | ICD-10-CM | POA: Diagnosis not present

## 2015-10-04 DIAGNOSIS — I480 Paroxysmal atrial fibrillation: Secondary | ICD-10-CM | POA: Diagnosis not present

## 2015-10-04 DIAGNOSIS — Z954 Presence of other heart-valve replacement: Secondary | ICD-10-CM

## 2015-10-04 DIAGNOSIS — Z953 Presence of xenogenic heart valve: Secondary | ICD-10-CM

## 2015-10-04 DIAGNOSIS — I4891 Unspecified atrial fibrillation: Secondary | ICD-10-CM | POA: Diagnosis not present

## 2015-10-04 LAB — POCT INR: INR: 2.5

## 2015-10-08 ENCOUNTER — Ambulatory Visit (INDEPENDENT_AMBULATORY_CARE_PROVIDER_SITE_OTHER): Payer: Medicare Other | Admitting: *Deleted

## 2015-10-08 DIAGNOSIS — I639 Cerebral infarction, unspecified: Secondary | ICD-10-CM

## 2015-10-08 NOTE — Progress Notes (Signed)
Carelink Summary Report / Loop Recorder 

## 2015-11-02 LAB — CUP PACEART REMOTE DEVICE CHECK: MDC IDC SESS DTM: 20170804090748

## 2015-11-02 NOTE — Progress Notes (Signed)
Carelink summary report received. Battery status OK. Normal device function. No new symptom episodes, tachy episodes, brady, or pause episodes. 15 AF 0.1% +warfarin. Monthly summary reports and ROV/PRN

## 2015-11-09 ENCOUNTER — Ambulatory Visit (INDEPENDENT_AMBULATORY_CARE_PROVIDER_SITE_OTHER): Payer: Medicare Other | Admitting: *Deleted

## 2015-11-09 DIAGNOSIS — I639 Cerebral infarction, unspecified: Secondary | ICD-10-CM

## 2015-11-09 NOTE — Progress Notes (Signed)
Carelink Summary Report / Loop Recorder 

## 2015-11-15 ENCOUNTER — Ambulatory Visit (INDEPENDENT_AMBULATORY_CARE_PROVIDER_SITE_OTHER): Payer: Medicare Other | Admitting: *Deleted

## 2015-11-15 DIAGNOSIS — I480 Paroxysmal atrial fibrillation: Secondary | ICD-10-CM | POA: Diagnosis not present

## 2015-11-15 DIAGNOSIS — D6859 Other primary thrombophilia: Secondary | ICD-10-CM

## 2015-11-15 DIAGNOSIS — I4891 Unspecified atrial fibrillation: Secondary | ICD-10-CM

## 2015-11-15 DIAGNOSIS — I824Y9 Acute embolism and thrombosis of unspecified deep veins of unspecified proximal lower extremity: Secondary | ICD-10-CM

## 2015-11-15 DIAGNOSIS — Z953 Presence of xenogenic heart valve: Secondary | ICD-10-CM

## 2015-11-15 DIAGNOSIS — Z954 Presence of other heart-valve replacement: Secondary | ICD-10-CM

## 2015-11-15 LAB — POCT INR: INR: 2.1

## 2015-11-17 ENCOUNTER — Encounter: Payer: Self-pay | Admitting: Interventional Cardiology

## 2015-12-07 ENCOUNTER — Ambulatory Visit (INDEPENDENT_AMBULATORY_CARE_PROVIDER_SITE_OTHER): Payer: Medicare Other | Admitting: *Deleted

## 2015-12-07 DIAGNOSIS — I639 Cerebral infarction, unspecified: Secondary | ICD-10-CM

## 2015-12-07 LAB — CUP PACEART REMOTE DEVICE CHECK
Date Time Interrogation Session: 20171003093745
MDC IDC PG IMPLANT DT: 20150209
MDC IDC SESS DTM: 20170903090645

## 2015-12-07 NOTE — Progress Notes (Signed)
Carelink summary report received. Battery status OK. Normal device function. No new symptom episodes, tachy episodes, brady, or pause episodes. 17 AF 0.1% +warfarin. Available ECGs appear: some AF, some SR w/ PACs, some UTD due to artifact. See ECGs. Monthly carelink reports, ROV as scheduled and PRN.

## 2015-12-08 ENCOUNTER — Encounter: Payer: Medicare Other | Admitting: *Deleted

## 2015-12-08 NOTE — Progress Notes (Signed)
Carelink Summary Report / Loop Recorder 

## 2015-12-27 ENCOUNTER — Ambulatory Visit (INDEPENDENT_AMBULATORY_CARE_PROVIDER_SITE_OTHER): Payer: Medicare Other | Admitting: *Deleted

## 2015-12-27 DIAGNOSIS — I4891 Unspecified atrial fibrillation: Secondary | ICD-10-CM

## 2015-12-27 DIAGNOSIS — D6859 Other primary thrombophilia: Secondary | ICD-10-CM | POA: Diagnosis not present

## 2015-12-27 DIAGNOSIS — Z953 Presence of xenogenic heart valve: Secondary | ICD-10-CM | POA: Diagnosis not present

## 2015-12-27 DIAGNOSIS — I824Y9 Acute embolism and thrombosis of unspecified deep veins of unspecified proximal lower extremity: Secondary | ICD-10-CM

## 2015-12-27 LAB — POCT INR: INR: 2.6

## 2016-01-06 ENCOUNTER — Ambulatory Visit (INDEPENDENT_AMBULATORY_CARE_PROVIDER_SITE_OTHER): Payer: Medicare Other | Admitting: *Deleted

## 2016-01-06 DIAGNOSIS — I639 Cerebral infarction, unspecified: Secondary | ICD-10-CM | POA: Diagnosis not present

## 2016-01-07 NOTE — Progress Notes (Signed)
Carelink Summary Report / Loop Recorder 

## 2016-01-15 NOTE — Progress Notes (Signed)
Carelink summary report received. Battery status OK. Normal device function. No new symptom episodes, tachy episodes, brady, or pause episodes. 5 AF 0%- 4 w/ ECGs appear SR w/ PACs and artifact. See ECGs. Monthly summary reports and ROV/PRN

## 2016-02-07 ENCOUNTER — Ambulatory Visit (INDEPENDENT_AMBULATORY_CARE_PROVIDER_SITE_OTHER): Payer: Medicare Other | Admitting: *Deleted

## 2016-02-07 DIAGNOSIS — I638 Other cerebral infarction: Secondary | ICD-10-CM | POA: Diagnosis not present

## 2016-02-07 DIAGNOSIS — I4891 Unspecified atrial fibrillation: Secondary | ICD-10-CM

## 2016-02-07 DIAGNOSIS — I824Y9 Acute embolism and thrombosis of unspecified deep veins of unspecified proximal lower extremity: Secondary | ICD-10-CM | POA: Diagnosis not present

## 2016-02-07 DIAGNOSIS — D6859 Other primary thrombophilia: Secondary | ICD-10-CM | POA: Diagnosis not present

## 2016-02-07 DIAGNOSIS — I6389 Other cerebral infarction: Secondary | ICD-10-CM

## 2016-02-07 DIAGNOSIS — Z953 Presence of xenogenic heart valve: Secondary | ICD-10-CM | POA: Diagnosis not present

## 2016-02-07 LAB — POCT INR: INR: 2.8

## 2016-02-07 NOTE — Progress Notes (Signed)
Carelink Summary Report / Loop Recorder 

## 2016-02-12 LAB — CUP PACEART REMOTE DEVICE CHECK
Implantable Pulse Generator Implant Date: 20150209
MDC IDC SESS DTM: 20171102113629

## 2016-02-12 NOTE — Progress Notes (Signed)
Carelink summary report received. Battery status OK. Normal device function. No new symptom episodes, tachy episodes, brady, or pause episodes. 6 AF 0% 4 w/ ECGs, 3 w/ ECGs unable to determine atrial rhythm due to artifact. 1 w/ ECG appears SR w/ PACs. +warfarin. Monthly summary reports and ROV/PRN

## 2016-03-07 ENCOUNTER — Ambulatory Visit (INDEPENDENT_AMBULATORY_CARE_PROVIDER_SITE_OTHER): Payer: Medicare Other | Admitting: *Deleted

## 2016-03-07 DIAGNOSIS — I638 Other cerebral infarction: Secondary | ICD-10-CM | POA: Diagnosis not present

## 2016-03-07 DIAGNOSIS — I6389 Other cerebral infarction: Secondary | ICD-10-CM

## 2016-03-08 NOTE — Progress Notes (Signed)
Carelink Summary Report 

## 2016-03-16 LAB — CUP PACEART REMOTE DEVICE CHECK
MDC IDC PG IMPLANT DT: 20150209
MDC IDC SESS DTM: 20171202123844

## 2016-03-20 ENCOUNTER — Ambulatory Visit (INDEPENDENT_AMBULATORY_CARE_PROVIDER_SITE_OTHER): Payer: Medicare Other | Admitting: *Deleted

## 2016-03-20 DIAGNOSIS — Z953 Presence of xenogenic heart valve: Secondary | ICD-10-CM | POA: Diagnosis not present

## 2016-03-20 DIAGNOSIS — I824Y9 Acute embolism and thrombosis of unspecified deep veins of unspecified proximal lower extremity: Secondary | ICD-10-CM | POA: Diagnosis not present

## 2016-03-20 DIAGNOSIS — D6859 Other primary thrombophilia: Secondary | ICD-10-CM

## 2016-03-20 DIAGNOSIS — I4891 Unspecified atrial fibrillation: Secondary | ICD-10-CM | POA: Diagnosis not present

## 2016-03-20 LAB — POCT INR: INR: 2.3

## 2016-04-05 ENCOUNTER — Ambulatory Visit (INDEPENDENT_AMBULATORY_CARE_PROVIDER_SITE_OTHER): Payer: Medicare Other | Admitting: *Deleted

## 2016-04-05 DIAGNOSIS — I638 Other cerebral infarction: Secondary | ICD-10-CM | POA: Diagnosis not present

## 2016-04-05 DIAGNOSIS — I6389 Other cerebral infarction: Secondary | ICD-10-CM

## 2016-04-05 NOTE — Progress Notes (Signed)
Carelink Summary Report / Loop Recorder 

## 2016-04-17 LAB — CUP PACEART REMOTE DEVICE CHECK
Implantable Pulse Generator Implant Date: 20150209
MDC IDC SESS DTM: 20180101131043

## 2016-04-27 ENCOUNTER — Other Ambulatory Visit: Payer: Self-pay | Admitting: Internal Medicine

## 2016-05-01 ENCOUNTER — Ambulatory Visit (INDEPENDENT_AMBULATORY_CARE_PROVIDER_SITE_OTHER): Payer: Medicare Other | Admitting: *Deleted

## 2016-05-01 DIAGNOSIS — Z953 Presence of xenogenic heart valve: Secondary | ICD-10-CM

## 2016-05-01 DIAGNOSIS — I824Y9 Acute embolism and thrombosis of unspecified deep veins of unspecified proximal lower extremity: Secondary | ICD-10-CM

## 2016-05-01 DIAGNOSIS — D6859 Other primary thrombophilia: Secondary | ICD-10-CM | POA: Diagnosis not present

## 2016-05-01 DIAGNOSIS — I4891 Unspecified atrial fibrillation: Secondary | ICD-10-CM

## 2016-05-01 LAB — POCT INR: INR: 2.3

## 2016-05-04 LAB — CUP PACEART REMOTE DEVICE CHECK
Date Time Interrogation Session: 20180131130938
MDC IDC PG IMPLANT DT: 20150209

## 2016-05-05 ENCOUNTER — Ambulatory Visit (INDEPENDENT_AMBULATORY_CARE_PROVIDER_SITE_OTHER): Payer: Medicare Other | Admitting: *Deleted

## 2016-05-05 DIAGNOSIS — I6389 Other cerebral infarction: Secondary | ICD-10-CM

## 2016-05-05 DIAGNOSIS — I638 Other cerebral infarction: Secondary | ICD-10-CM

## 2016-05-08 NOTE — Progress Notes (Signed)
Carelink Summary Report / Loop Recorder 

## 2016-05-18 ENCOUNTER — Encounter: Payer: Self-pay | Admitting: Interventional Cardiology

## 2016-05-24 LAB — CUP PACEART REMOTE DEVICE CHECK
Date Time Interrogation Session: 20180302131521
Implantable Pulse Generator Implant Date: 20150209

## 2016-05-31 ENCOUNTER — Encounter: Payer: Self-pay | Admitting: Internal Medicine

## 2016-06-05 ENCOUNTER — Ambulatory Visit (INDEPENDENT_AMBULATORY_CARE_PROVIDER_SITE_OTHER): Payer: Medicare Other | Admitting: *Deleted

## 2016-06-05 DIAGNOSIS — I638 Other cerebral infarction: Secondary | ICD-10-CM | POA: Diagnosis not present

## 2016-06-05 DIAGNOSIS — I6389 Other cerebral infarction: Secondary | ICD-10-CM

## 2016-06-05 NOTE — Progress Notes (Signed)
Carelink Summary Report / Loop Recorder 

## 2016-06-08 LAB — CUP PACEART REMOTE DEVICE CHECK
Date Time Interrogation Session: 20180401133935
MDC IDC PG IMPLANT DT: 20150209

## 2016-06-12 ENCOUNTER — Encounter: Payer: Self-pay | Admitting: Internal Medicine

## 2016-06-12 ENCOUNTER — Ambulatory Visit (INDEPENDENT_AMBULATORY_CARE_PROVIDER_SITE_OTHER): Payer: Medicare Other | Admitting: Pharmacist

## 2016-06-12 ENCOUNTER — Encounter (INDEPENDENT_AMBULATORY_CARE_PROVIDER_SITE_OTHER): Payer: Self-pay

## 2016-06-12 ENCOUNTER — Ambulatory Visit (INDEPENDENT_AMBULATORY_CARE_PROVIDER_SITE_OTHER): Payer: Medicare Other | Admitting: Internal Medicine

## 2016-06-12 VITALS — BP 148/92 | HR 51 | Ht 75.0 in | Wt 200.8 lb

## 2016-06-12 DIAGNOSIS — D6859 Other primary thrombophilia: Secondary | ICD-10-CM

## 2016-06-12 DIAGNOSIS — I480 Paroxysmal atrial fibrillation: Secondary | ICD-10-CM | POA: Diagnosis not present

## 2016-06-12 DIAGNOSIS — I4891 Unspecified atrial fibrillation: Secondary | ICD-10-CM | POA: Diagnosis not present

## 2016-06-12 DIAGNOSIS — Z953 Presence of xenogenic heart valve: Secondary | ICD-10-CM

## 2016-06-12 DIAGNOSIS — I824Y9 Acute embolism and thrombosis of unspecified deep veins of unspecified proximal lower extremity: Secondary | ICD-10-CM

## 2016-06-12 LAB — CUP PACEART INCLINIC DEVICE CHECK
Implantable Pulse Generator Implant Date: 20150209
MDC IDC SESS DTM: 20180409100443

## 2016-06-12 LAB — POCT INR: INR: 2.4

## 2016-06-12 NOTE — Progress Notes (Signed)
HPI [name] returns today for followup. He is a 82 yo man with a h/o cryptogenic stroke, aortic stenosis, s/p replacement, s/p ILR insertion. In the interim he has done well with no chest pressure or syncope. He has been found to have atrial fib and has been placed on warfarin.  He denies sob with exertion. No chest pain. No edema. He has minimal palpitations. Allergies  Allergen Reactions  . Sudafed [Pseudoephedrine Hcl] Palpitations  . Cardizem [Diltiazem Hcl] Rash     Current Outpatient Prescriptions  Medication Sig Dispense Refill  . acetaminophen (TYLENOL) 500 MG tablet Take 1,000 mg by mouth every 6 (six) hours as needed for mild pain.    Marland Kitchen aspirin EC 81 MG EC tablet Take 1 tablet (81 mg total) by mouth daily.    . carvedilol (COREG) 3.125 MG tablet Take 3.125 mg by mouth 2 (two) times daily with a meal.    . docusate sodium (COLACE) 100 MG capsule Take 100 mg by mouth daily as needed for mild constipation. Takes as needed about 3 times a week    . meclizine (ANTIVERT) 25 MG tablet Take 25 mg by mouth 3 (three) times daily as needed for dizziness.    . Multiple Vitamin (MULTIVITAMIN WITH MINERALS) TABS tablet Take 1 tablet by mouth daily as needed (vitamin supplement).     . Multiple Vitamins-Minerals (STRESS B COMPLEX/ZINC PO) Take 1 tablet by mouth daily.     Marland Kitchen omeprazole (PRILOSEC) 20 MG capsule Take 20 mg by mouth daily as needed (for acid reduction).     Vladimir Faster Glycol-Propyl Glycol (SYSTANE OP) Place 1 drop into both eyes daily as needed (for dry eyes).     . rosuvastatin (CRESTOR) 10 MG tablet Take 10 mg by mouth daily.    . tamsulosin (FLOMAX) 0.4 MG CAPS capsule Take 0.4 mg by mouth as directed.    . warfarin (COUMADIN) 5 MG tablet TAKE AS DIRECTED 90 tablet 1   No current facility-administered medications for this visit.      Past Medical History:  Diagnosis Date  . AORTIC STENOSIS    a. Echo (05/26/13):  Mod LVH, vigorous LVF, EF 65-70%, no RWMA, Gr 1  DD, severe AS (mean 40 mmHg) => s/p bioprosthetic AVR 07/2013  . Arthritis    BACK  . CAD (coronary artery disease)    a. Lexiscan myoview (5/13) with EF 70%, no ischemia or infarction;   b. LHC (06/2013):  Dist LM 30%, ostial LAD 40-50%, prox LAD 30%, ostial D1 80-90%, prox CFX 40%, RCA occluded with L-R collats => CABG (S-PDA at time of AVR)  . CVA (cerebral infarction)    a. 3/14 right parietal infarct. Carotid US 3/14 showed no significant disease. He has an implanted loop recorder to look for atrial fibrillation.   . DYSPEPSIA   . ED (erectile dysfunction)   . Former smoker   . GERD (gastroesophageal reflux disease)   . History of kidney stones   . HYPERLIPIDEMIA   . HYPERTENSION   . Palpitations    a. Event monitor (5/13) with occasional PVCs, PACs but no significant arrhythmia.   . S/P aortic valve replacement with bioprosthetic valve + CABG x1 07/11/2013   23 mm [facility] Ease bovine pericardial tissue valve;  Echo (08/2013):  EF 55-60%, no RWMA, AVR ok, Asc Ao 40 mm (mildly dilated), mild LAE, mild RVE, lipomatous hypertrophy, mod TR, PASP 36 mmHg  . S/P CABG x 1 07/11/2013  SVG to PDA with EVH via right thigh  . Sinus bradycardia    a. 2013 - HR 40's in the office. BB stopped.  . Trigeminal neuropathy   . Vertigo     ROS:   All systems reviewed and negative except as noted in the HPI.   Past Surgical History:  Procedure Laterality Date  . AORTIC VALVE REPLACEMENT N/A 07/11/2013   Procedure: AORTIC VALVE REPLACEMENT (AVR);  Surgeon: [name], MD;  Location: [place];  Service: Open Heart Surgery;  Laterality: N/A;  . CATARACT EXTRACTION    . CORONARY ARTERY BYPASS GRAFT N/A 07/11/2013   Procedure: CORONARY ARTERY BYPASS GRAFTING (CABG) x1;  Surgeon: [name], MD;  Location: [place];  Service: Open Heart Surgery;  Laterality: N/A;  . INGUINAL HERNIA REPAIR Right 01/15/2013   Procedure: HERNIA REPAIR INGUINAL ADULT;  Surgeon: Harl Bowie, MD;  Location: [place];  Service: General;  Laterality: Right;  . INSERTION OF MESH Right 01/15/2013   Procedure: INSERTION OF MESH;  Surgeon: Harl Bowie, MD;  Location: [place];  Service: General;  Laterality: Right;  . INTRAOPERATIVE TRANSESOPHAGEAL ECHOCARDIOGRAM N/A 07/11/2013   Procedure: INTRAOPERATIVE TRANSESOPHAGEAL ECHOCARDIOGRAM;  Surgeon: [name], MD;  Location: [place];  Service: Open Heart Surgery;  Laterality: N/A;  . LEFT AND RIGHT HEART CATHETERIZATION WITH CORONARY ANGIOGRAM N/A 06/06/2013   Procedure: LEFT AND RIGHT HEART CATHETERIZATION WITH CORONARY ANGIOGRAM;  Surgeon: Larey Dresser, MD;  Location: [facility] CATH LAB;  Service: Cardiovascular;  Laterality: N/A;  . LITHOTRIPSY    . LOOP RECORDER IMPLANT  04/15/2013   MDT LinQ implanred by Dr Lovena Le for cryptogenic stroke  . LOOP RECORDER IMPLANT N/A 04/14/2013   Procedure: LOOP RECORDER IMPLANT;  Surgeon: Evans Lance, MD;  Location[facility] C[facility] LAB;  Service: Cardiovascular;  Laterality: N/A;  . MOUTH SURGERY       Family History  Problem Relation Age of Onset  . Diabetes Mother   . Heart attack Mother   . Heart attack Father   . Hypertension Father   . Diabetes    . Hypertension    . Lung cancer    . Cancer Sister     breast  . Stroke Neg Hx      Social History   Social History  . Marital status: Married    Spouse name: N/A  . Number of children: N/A  . Years of education: N/A   Occupational History  . Not on file.   Social History Main Topics  . Smoking status: Former Smoker    Packs/day: 1.00    Years: 30.00    Types: Cigarettes    Quit date: 08/29/2009  . Smokeless tobacco: Never Used  . Alcohol use No  . Drug use: No  . Sexual activity: Not Currently   Other Topics Concern  . Not on file   Social History Narrative   He resides in [place] with his wife and son. 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  No    grandchildren. He smokes  half a pack per day intermittently for the last 40    years. He drinks a glass of homemade wine mixed with diet Coke one time per    week. He denies any drugs, herbal medication, diet, or exercise program.           BP (!) 148/92  Ht 6\' 3"  (1.905 m)   Physical Exam:  Well appearing 81 yo man, NAD HEENT: Unremarkable Neck:  6 cm JVD, no thyromegally Back:  No CVA tenderness Lungs:  Clear with no wheezes HEART:  Regular rate rhythm, no murmurs, no rubs, no clicks Abd:  soft, positive bowel sounds, no organomegally, no rebound, no guarding Ext:  2 plus pulses, no edema, no cyanosis, no clubbing Skin:  No rashes no nodules Neuro:  CN II through XII intact, motor grossly intact   DEVICE  Normal device function.  See PaceArt for details. Normal ILR function. Atrial fib is present.  Assess/Plan: 1. Stroke - he appears to have had atrial fib and is now on anti-coagulation. I offered him the option of removing his ILR but he prefers to leave it in for now. 2. Atrial fib - he is minimally symptomatic and his burden of atrial fib is low. 3. HTN - his blood pressure is well controlled. Will follow. 4. Bradycardia - interogation of his ILR demonstrates pauses but he does not appear to be symptomatic. I have warned him to call us if he has syncope.  Mikle Bosworth.D.

## 2016-06-12 NOTE — Patient Instructions (Signed)
Medication Instructions:  Your physician recommends that you continue on your current medications as directed. Please refer to the Current Medication list given to you today.   Labwork: None Ordered   Testing/Procedures: None Ordered   Follow-Up: Your physician wants you to follow-up in: 1 year with Dr. Taylor.  You will receive a reminder letter in the mail two months in advance. If you don't receive a letter, please call our office to schedule the follow-up appointment.   If you need a refill on your cardiac medications before your next appointment, please call your pharmacy.   Thank you for choosing [facility]! Lawrnce Reyez, RN [phone]    

## 2016-06-16 ENCOUNTER — Telehealth: Payer: Self-pay | Admitting: *Deleted

## 2016-06-16 NOTE — Telephone Encounter (Signed)
LVMOM requesting a manual transmission for Software update on LINQ. [facility] phone number to call back for further questions.

## 2016-06-23 NOTE — Telephone Encounter (Signed)
LVMOM requesting manual transmission for software update on LINQ. [facility] phone number to call back.

## 2016-06-23 NOTE — Telephone Encounter (Signed)
LINQ reached RRT since June 14, 2016, Software update required, patient declined manual transmission. Patient declined LINQ explant per Dr. Lovena Le note 06/12/16. Sent return kit, unenrolled patient from [facility].

## 2016-06-23 NOTE — Telephone Encounter (Signed)
Pt called and stated that MD told him at his last office visit that he could return his home monitor for the loop recorder. Informed pt that the Device Tech RN was calling to have pt send a remote transmission b/c his home monitor needed a software update. If he wanted to return it we would order the return kit. Pt verbalized that he wanted to return the monitor.

## 2016-07-04 ENCOUNTER — Encounter: Payer: Medicare Other | Admitting: *Deleted

## 2016-07-10 ENCOUNTER — Other Ambulatory Visit: Payer: Self-pay | Admitting: Internal Medicine

## 2016-07-12 ENCOUNTER — Telehealth: Payer: Self-pay | Admitting: Internal Medicine

## 2016-07-12 NOTE — Telephone Encounter (Signed)
Return kit ordered 06/23/16 not received. I will order another return kit and made Sean Brock aware that he should receive it in 7-10 business days. He verbalizes understanding. Address confirmed.

## 2016-07-12 NOTE — Telephone Encounter (Signed)
New message   Pt is calling stating he has not received the kit to return his loop recorder.

## 2016-07-21 ENCOUNTER — Telehealth: Payer: Self-pay | Admitting: Internal Medicine

## 2016-07-21 NOTE — Telephone Encounter (Signed)
[facility] return kits on backorder. Pt will call back if not received by 08/04/16.

## 2016-07-21 NOTE — Telephone Encounter (Signed)
°  New Prob  Pt states he has not received a box to return his loop recorder. Calling for instructions and how to move forward. Please call.

## 2016-07-24 ENCOUNTER — Ambulatory Visit (INDEPENDENT_AMBULATORY_CARE_PROVIDER_SITE_OTHER): Payer: Medicare Other | Admitting: *Deleted

## 2016-07-24 DIAGNOSIS — I824Y9 Acute embolism and thrombosis of unspecified deep veins of unspecified proximal lower extremity: Secondary | ICD-10-CM

## 2016-07-24 DIAGNOSIS — Z953 Presence of xenogenic heart valve: Secondary | ICD-10-CM | POA: Diagnosis not present

## 2016-07-24 DIAGNOSIS — D6859 Other primary thrombophilia: Secondary | ICD-10-CM | POA: Diagnosis not present

## 2016-07-24 DIAGNOSIS — I4891 Unspecified atrial fibrillation: Secondary | ICD-10-CM | POA: Diagnosis not present

## 2016-07-24 LAB — POCT INR: INR: 2.3

## 2016-09-04 ENCOUNTER — Ambulatory Visit (INDEPENDENT_AMBULATORY_CARE_PROVIDER_SITE_OTHER): Payer: Medicare Other | Admitting: Pharmacist

## 2016-09-04 ENCOUNTER — Encounter (INDEPENDENT_AMBULATORY_CARE_PROVIDER_SITE_OTHER): Payer: Self-pay

## 2016-09-04 DIAGNOSIS — I4891 Unspecified atrial fibrillation: Secondary | ICD-10-CM

## 2016-09-04 DIAGNOSIS — Z953 Presence of xenogenic heart valve: Secondary | ICD-10-CM | POA: Diagnosis not present

## 2016-09-04 DIAGNOSIS — I824Y9 Acute embolism and thrombosis of unspecified deep veins of unspecified proximal lower extremity: Secondary | ICD-10-CM

## 2016-09-04 DIAGNOSIS — D6859 Other primary thrombophilia: Secondary | ICD-10-CM | POA: Diagnosis not present

## 2016-09-04 LAB — POCT INR: INR: 2.4

## 2016-10-16 ENCOUNTER — Encounter (INDEPENDENT_AMBULATORY_CARE_PROVIDER_SITE_OTHER): Payer: Self-pay

## 2016-10-16 ENCOUNTER — Ambulatory Visit (INDEPENDENT_AMBULATORY_CARE_PROVIDER_SITE_OTHER): Payer: Medicare Other

## 2016-10-16 DIAGNOSIS — I4891 Unspecified atrial fibrillation: Secondary | ICD-10-CM

## 2016-10-16 DIAGNOSIS — Z953 Presence of xenogenic heart valve: Secondary | ICD-10-CM | POA: Diagnosis not present

## 2016-10-16 DIAGNOSIS — I824Y9 Acute embolism and thrombosis of unspecified deep veins of unspecified proximal lower extremity: Secondary | ICD-10-CM | POA: Diagnosis not present

## 2016-10-16 DIAGNOSIS — D6859 Other primary thrombophilia: Secondary | ICD-10-CM

## 2016-10-16 LAB — POCT INR: INR: 2.8

## 2016-11-27 ENCOUNTER — Ambulatory Visit (INDEPENDENT_AMBULATORY_CARE_PROVIDER_SITE_OTHER): Payer: Medicare Other

## 2016-11-27 DIAGNOSIS — Z5181 Encounter for therapeutic drug level monitoring: Secondary | ICD-10-CM | POA: Diagnosis not present

## 2016-11-27 DIAGNOSIS — Z953 Presence of xenogenic heart valve: Secondary | ICD-10-CM

## 2016-11-27 DIAGNOSIS — I4891 Unspecified atrial fibrillation: Secondary | ICD-10-CM

## 2016-11-27 DIAGNOSIS — D6859 Other primary thrombophilia: Secondary | ICD-10-CM

## 2016-11-27 DIAGNOSIS — I824Y9 Acute embolism and thrombosis of unspecified deep veins of unspecified proximal lower extremity: Secondary | ICD-10-CM

## 2016-11-27 LAB — POCT INR: INR: 3

## 2017-01-22 ENCOUNTER — Encounter (INDEPENDENT_AMBULATORY_CARE_PROVIDER_SITE_OTHER): Payer: Self-pay

## 2017-01-22 ENCOUNTER — Ambulatory Visit (INDEPENDENT_AMBULATORY_CARE_PROVIDER_SITE_OTHER): Payer: Medicare Other | Admitting: Pharmacist

## 2017-01-22 DIAGNOSIS — Z5181 Encounter for therapeutic drug level monitoring: Secondary | ICD-10-CM

## 2017-01-22 DIAGNOSIS — Z953 Presence of xenogenic heart valve: Secondary | ICD-10-CM | POA: Diagnosis not present

## 2017-01-22 DIAGNOSIS — I4891 Unspecified atrial fibrillation: Secondary | ICD-10-CM | POA: Diagnosis not present

## 2017-01-22 DIAGNOSIS — D6859 Other primary thrombophilia: Secondary | ICD-10-CM

## 2017-01-22 LAB — POCT INR: INR: 2.9

## 2017-01-22 NOTE — Patient Instructions (Signed)
Continue on same dosage of Coumadin 1/2 tablet daily except 1 tablet on Mondays and Fridays.  Recheck in 6 weeks.

## 2017-01-30 ENCOUNTER — Other Ambulatory Visit: Payer: Self-pay | Admitting: Internal Medicine

## 2017-03-02 ENCOUNTER — Ambulatory Visit (INDEPENDENT_AMBULATORY_CARE_PROVIDER_SITE_OTHER): Payer: Medicare Other | Admitting: *Deleted

## 2017-03-02 DIAGNOSIS — Z5181 Encounter for therapeutic drug level monitoring: Secondary | ICD-10-CM

## 2017-03-02 DIAGNOSIS — I824Y9 Acute embolism and thrombosis of unspecified deep veins of unspecified proximal lower extremity: Secondary | ICD-10-CM | POA: Diagnosis not present

## 2017-03-02 DIAGNOSIS — Z953 Presence of xenogenic heart valve: Secondary | ICD-10-CM

## 2017-03-02 DIAGNOSIS — I4891 Unspecified atrial fibrillation: Secondary | ICD-10-CM | POA: Diagnosis not present

## 2017-03-02 DIAGNOSIS — D6859 Other primary thrombophilia: Secondary | ICD-10-CM

## 2017-03-02 LAB — POCT INR: INR: 2

## 2017-03-02 NOTE — Patient Instructions (Signed)
Description   Continue on same dosage of Coumadin 1/2 tablet daily except 1 tablet on Mondays and Fridays.  Call back with new medicine once you pick it up & then we will set your next appt, please call [phone]

## 2017-03-05 ENCOUNTER — Ambulatory Visit (INDEPENDENT_AMBULATORY_CARE_PROVIDER_SITE_OTHER): Payer: Medicare Other | Admitting: *Deleted

## 2017-03-05 DIAGNOSIS — Z953 Presence of xenogenic heart valve: Secondary | ICD-10-CM

## 2017-03-05 DIAGNOSIS — Z5181 Encounter for therapeutic drug level monitoring: Secondary | ICD-10-CM | POA: Diagnosis not present

## 2017-03-05 DIAGNOSIS — I4891 Unspecified atrial fibrillation: Secondary | ICD-10-CM

## 2017-03-05 DIAGNOSIS — Z951 Presence of aortocoronary bypass graft: Secondary | ICD-10-CM

## 2017-03-05 DIAGNOSIS — D6859 Other primary thrombophilia: Secondary | ICD-10-CM

## 2017-03-05 DIAGNOSIS — I824Y9 Acute embolism and thrombosis of unspecified deep veins of unspecified proximal lower extremity: Secondary | ICD-10-CM

## 2017-03-05 LAB — POCT INR: INR: 2.2

## 2017-03-05 NOTE — Patient Instructions (Signed)
Description   Continue on same dosage of Coumadin 1/2 tablet daily except 1 tablet on Mondays and Fridays. Recheck INR on Monday due to finishing  Doxycycline 100mg  on Sunday Jan 6th  Please call [phone] with any new medications or if scheduled for any procedures

## 2017-03-12 ENCOUNTER — Ambulatory Visit (INDEPENDENT_AMBULATORY_CARE_PROVIDER_SITE_OTHER): Payer: Medicare Other

## 2017-03-12 DIAGNOSIS — I824Y9 Acute embolism and thrombosis of unspecified deep veins of unspecified proximal lower extremity: Secondary | ICD-10-CM

## 2017-03-12 DIAGNOSIS — I4891 Unspecified atrial fibrillation: Secondary | ICD-10-CM

## 2017-03-12 DIAGNOSIS — I480 Paroxysmal atrial fibrillation: Secondary | ICD-10-CM

## 2017-03-12 DIAGNOSIS — D6859 Other primary thrombophilia: Secondary | ICD-10-CM | POA: Diagnosis not present

## 2017-03-12 DIAGNOSIS — Z953 Presence of xenogenic heart valve: Secondary | ICD-10-CM | POA: Diagnosis not present

## 2017-03-12 DIAGNOSIS — Z5181 Encounter for therapeutic drug level monitoring: Secondary | ICD-10-CM | POA: Diagnosis not present

## 2017-03-12 LAB — POCT INR: INR: 2.3

## 2017-03-12 NOTE — Patient Instructions (Signed)
Continue on same dosage of Coumadin 1/2 tablet daily except 1 tablet on Mondays and Fridays. Recheck INR in 6 weeks. Please call [phone] with any new medications or if scheduled for any procedures.

## 2017-04-23 ENCOUNTER — Ambulatory Visit: Payer: Medicare Other | Admitting: *Deleted

## 2017-04-23 DIAGNOSIS — Z953 Presence of xenogenic heart valve: Secondary | ICD-10-CM

## 2017-04-23 DIAGNOSIS — Z5181 Encounter for therapeutic drug level monitoring: Secondary | ICD-10-CM

## 2017-04-23 DIAGNOSIS — I824Y9 Acute embolism and thrombosis of unspecified deep veins of unspecified proximal lower extremity: Secondary | ICD-10-CM | POA: Diagnosis not present

## 2017-04-23 DIAGNOSIS — I4891 Unspecified atrial fibrillation: Secondary | ICD-10-CM | POA: Diagnosis not present

## 2017-04-23 DIAGNOSIS — D6859 Other primary thrombophilia: Secondary | ICD-10-CM

## 2017-04-23 LAB — POCT INR: INR: 2.3

## 2017-04-23 NOTE — Patient Instructions (Signed)
Description   Continue on same dosage of Coumadin 1/2 tablet daily except 1 tablet on Mondays and Fridays. Recheck INR in 6 weeks. Please call [phone] with any new medications or if scheduled for any procedures.      

## 2017-06-04 ENCOUNTER — Ambulatory Visit: Payer: Medicare Other | Admitting: *Deleted

## 2017-06-04 DIAGNOSIS — I4891 Unspecified atrial fibrillation: Secondary | ICD-10-CM

## 2017-06-04 DIAGNOSIS — I824Y9 Acute embolism and thrombosis of unspecified deep veins of unspecified proximal lower extremity: Secondary | ICD-10-CM

## 2017-06-04 DIAGNOSIS — Z953 Presence of xenogenic heart valve: Secondary | ICD-10-CM

## 2017-06-04 DIAGNOSIS — Z5181 Encounter for therapeutic drug level monitoring: Secondary | ICD-10-CM

## 2017-06-04 DIAGNOSIS — D6859 Other primary thrombophilia: Secondary | ICD-10-CM

## 2017-06-04 LAB — POCT INR: INR: 2.1

## 2017-06-04 NOTE — Patient Instructions (Signed)
Description   Continue on same dosage of Coumadin 1/2 tablet daily except 1 tablet on Mondays and Fridays. Recheck INR in 6 weeks. Please call [phone] with any new medications or if scheduled for any procedures.      

## 2017-06-20 ENCOUNTER — Encounter: Payer: Self-pay | Admitting: Internal Medicine

## 2017-07-02 ENCOUNTER — Ambulatory Visit: Payer: Medicare Other | Admitting: Internal Medicine

## 2017-07-02 ENCOUNTER — Encounter: Payer: Self-pay | Admitting: Internal Medicine

## 2017-07-02 VITALS — BP 138/62 | HR 55 | Ht 74.0 in | Wt 167.4 lb

## 2017-07-02 DIAGNOSIS — I10 Essential (primary) hypertension: Secondary | ICD-10-CM | POA: Diagnosis not present

## 2017-07-02 DIAGNOSIS — I480 Paroxysmal atrial fibrillation: Secondary | ICD-10-CM

## 2017-07-02 DIAGNOSIS — Z8679 Personal history of other diseases of the circulatory system: Secondary | ICD-10-CM | POA: Diagnosis not present

## 2017-07-02 NOTE — Patient Instructions (Signed)

## 2017-07-02 NOTE — Progress Notes (Signed)
HPI [name] returns today for followup. He is a 83 yo man with a h/o cryptogenic stroke, aortic stenosis, s/p replacement, s/p ILR insertion. In the interim he has done well with no chest pressure or syncope. He has been found to have atrial fib and has been placed on warfarin.  He denies sob with exertion. No chest pain. No edema. He has minimal palpitations. He remains active. He still gets dizzy and meclizine helps.  Allergies  Allergen Reactions  . Sudafed [Pseudoephedrine Hcl] Palpitations  . Cardizem [Diltiazem Hcl] Rash     Current Outpatient Medications  Medication Sig Dispense Refill  . acetaminophen (TYLENOL) 500 MG tablet Take 1,000 mg by mouth every 6 (six) hours as needed for mild pain.    Marland Kitchen aspirin EC 81 MG EC tablet Take 1 tablet (81 mg total) by mouth daily.    . carvedilol (COREG) 3.125 MG tablet Take 3.125 mg by mouth 2 (two) times daily with a meal.    . docusate sodium (COLACE) 100 MG capsule Take 100 mg by mouth daily as needed for mild constipation. Takes as needed about 3 times a week    . meclizine (ANTIVERT) 25 MG tablet Take 25 mg by mouth 3 (three) times daily as needed for dizziness.    . Multiple Vitamins-Minerals (STRESS B COMPLEX/ZINC PO) Take 1 tablet by mouth daily.     Marland Kitchen omeprazole (PRILOSEC) 20 MG capsule Take 20 mg by mouth daily as needed (for acid reduction).     Vladimir Faster Glycol-Propyl Glycol (SYSTANE OP) Place 1 drop into both eyes daily as needed (for dry eyes).     . rosuvastatin (CRESTOR) 10 MG tablet Take 10 mg by mouth daily.    . tamsulosin (FLOMAX) 0.4 MG CAPS capsule Take 0.4 mg by mouth as directed.    . warfarin (COUMADIN) 5 MG tablet Take as directed by [facility] 90 tablet 1   No current facility-administered medications for this visit.      Past Medical History:  Diagnosis Date  . AORTIC STENOSIS    a. Echo (05/26/13):  Mod LVH, vigorous LVF, EF 65-70%, no RWMA, Gr 1 DD, severe AS (mean 40 mmHg) => s/p  bioprosthetic AVR 07/2013  . Arthritis    BACK  . CAD (coronary artery disease)    a. Lexiscan myoview (5/13) with EF 70%, no ischemia or infarction;   b. LHC (06/2013):  Dist LM 30%, ostial LAD 40-50%, prox LAD 30%, ostial D1 80-90%, prox CFX 40%, RCA occluded with L-R collats => CABG (S-PDA at time of AVR)  . CVA (cerebral infarction)    a. 3/14 right parietal infarct. Carotid US 3/14 showed no significant disease. He has an implanted loop recorder to look for atrial fibrillation.   . DYSPEPSIA   . ED (erectile dysfunction)   . Former smoker   . GERD (gastroesophageal reflux disease)   . History of kidney stones   . HYPERLIPIDEMIA   . HYPERTENSION   . Palpitations    a. Event monitor (5/13) with occasional PVCs, PACs but no significant arrhythmia.   . S/P aortic valve replacement with bioprosthetic valve + CABG x1 07/11/2013   [facility] Ease bovine pericardial tissue valve;  Echo (08/2013):  EF 55-60%, no RWMA, AVR ok, Asc Ao 40 mm (mildly dilated), mild LAE, mild RVE, lipomatous hypertrophy, mod TR, PASP 36 mmHg  . S/P CABG x 1 07/11/2013   SVG to PDA with [facility] via right  thigh  . Sinus bradycardia    a. 2013 - HR 40's in the office. BB stopped.  . Trigeminal neuropathy   . Vertigo     ROS:   All systems reviewed and negative except as noted in the HPI.   Past Surgical History:  Procedure Laterality Date  . AORTIC VALVE REPLACEMENT N/A 07/11/2013   Procedure: AORTIC VALVE REPLACEMENT (AVR);  Surgeon: [name], MD;  Location: [place];  Service: Open Heart Surgery;  Laterality: N/A;  . CATARACT EXTRACTION    . CORONARY ARTERY BYPASS GRAFT N/A 07/11/2013   Procedure: CORONARY ARTERY BYPASS GRAFTING (CABG) x1;  Surgeon: [name], MD;  Location: [place];  Service: Open Heart Surgery;  Laterality: N/A;  . INGUINAL HERNIA REPAIR Right 01/15/2013   Procedure: HERNIA REPAIR INGUINAL ADULT;  Surgeon: Harl Bowie, MD;  Location: [place];  Service:  General;  Laterality: Right;  . INSERTION OF MESH Right 01/15/2013   Procedure: INSERTION OF MESH;  Surgeon: Harl Bowie, MD;  Location: [place];  Service: General;  Laterality: Right;  . INTRAOPERATIVE TRANSESOPHAGEAL ECHOCARDIOGRAM N/A 07/11/2013   Procedure: INTRAOPERATIVE TRANSESOPHAGEAL ECHOCARDIOGRAM;  Surgeon: [name], MD;  Location: [place];  Service: Open Heart Surgery;  Laterality: N/A;  . LEFT AND RIGHT HEART CATHETERIZATION WITH CORONARY ANGIOGRAM N/A 06/06/2013   Procedure: LEFT AND RIGHT HEART CATHETERIZATION WITH CORONARY ANGIOGRAM;  Surgeon: Larey Dresser, MD;  Location: [facility] CATH LAB;  Service: Cardiovascular;  Laterality: N/A;  . LITHOTRIPSY    . LOOP RECORDER IMPLANT  04/15/2013   MDT LinQ implanred by Dr Lovena Le for cryptogenic stroke  . LOOP RECORDER IMPLANT N/A 04/14/2013   Procedure: LOOP RECORDER IMPLANT;  Surgeon: Evans Lance, MD;  Location: [facility] CATH LAB;  Service: Cardiovascular;  Laterality: N/A;  . MOUTH SURGERY       Family History  Problem Relation Age of Onset  . Diabetes Mother   . Heart attack Mother   . Heart attack Father   . Hypertension Father   . Diabetes Unknown   . Hypertension Unknown   . Lung cancer Unknown   . Cancer Sister        breast  . Stroke Neg Hx      Social History   Socioeconomic History  . Marital status: Married    Spouse name: Not on file  . Number of children: Not on file  . Years of education: Not on file  . Highest education level: Not on file  Occupational History  . Not on file  Social Needs  . Financial resource strain: Not on file  . Food insecurity:    Worry: Not on file    Inability: Not on file  . Transportation needs:    Medical: Not on file    Non-medical: Not on file  Tobacco Use  . Smoking status: Former Smoker    Packs/day: 1.00    Years: 30.00    Pack years: 30.00    Types: Cigarettes    Last attempt to quit: 08/29/2009    Years since quitting: 7.8  . Smokeless  tobacco: Never Used  Substance and Sexual Activity  . Alcohol use: No  . Drug use: No  . Sexual activity: Not Currently  Lifestyle  . Physical activity:    Days per week: Not on file    Minutes per session: Not on file  . Stress: Not on file  Relationships  . Social connections:  Talks on phone: Not on file    Gets together: Not on file    Attends religious service: Not on file    Active member of club or organization: Not on file    Attends meetings of clubs or organizations: Not on file    Relationship status: Not on file  . Intimate partner violence:    Fear of current or ex partner: Not on file    Emotionally abused: Not on file    Physically abused: Not on file    Forced sexual activity: Not on file  Other Topics Concern  . Not on file  Social History Narrative   He resides in [place] with his wife and son. 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  No    grandchildren. He smokes half a pack per day intermittently for the last 40    years. He drinks a glass of homemade wine mixed with diet Coke one time per    week. He denies any drugs, herbal medication, diet, or exercise program.           BP 138/62   Pulse (!) 55   Ht 6\' 2"  (1.88 m)   Wt 167 lb 6.4 oz (75.9 kg)   SpO2 97%   BMI 21.49 kg/m   Physical Exam:  Well appearing 83 yo man, NAD HEENT: Unremarkable Neck:  6 cm JVD, no thyromegally Lymphatics:  No adenopathy Back:  No CVA tenderness Lungs:  Clear with no wheezes HEART:  Regular rate rhythm, no murmurs, no rubs, no clicks Abd:  soft, positive bowel sounds, no organomegally, no rebound, no guarding Ext:  2 plus pulses, no edema, no cyanosis, no clubbing Skin:  No rashes no nodules Neuro:  CN II through XII intact, motor grossly intact  EKG - NSR   Assess/Plan: 1. PAF - he is in rhythm today and denies palpitations. He will continue his systemic anticoagulation 2. HTN - his blood pressure is up a bit but at  home it is not. He will continue his current meds. 3. CAD - he is s/p CABG at the time of AVR. He is asymptomatic.   Mikle Bosworth.D.

## 2017-07-16 ENCOUNTER — Ambulatory Visit: Payer: Medicare Other | Admitting: Pharmacist

## 2017-07-16 DIAGNOSIS — I824Y9 Acute embolism and thrombosis of unspecified deep veins of unspecified proximal lower extremity: Secondary | ICD-10-CM | POA: Diagnosis not present

## 2017-07-16 DIAGNOSIS — Z953 Presence of xenogenic heart valve: Secondary | ICD-10-CM

## 2017-07-16 DIAGNOSIS — D6859 Other primary thrombophilia: Secondary | ICD-10-CM

## 2017-07-16 DIAGNOSIS — I4891 Unspecified atrial fibrillation: Secondary | ICD-10-CM | POA: Diagnosis not present

## 2017-07-16 DIAGNOSIS — Z5181 Encounter for therapeutic drug level monitoring: Secondary | ICD-10-CM

## 2017-07-16 LAB — POCT INR: INR: 2.4

## 2017-07-16 NOTE — Patient Instructions (Signed)
Description   Continue on same dosage of Coumadin 1/2 tablet daily except 1 tablet on Mondays and Fridays. Recheck INR in 6 weeks. Please call [phone] with any new medications or if scheduled for any procedures.      

## 2017-08-27 ENCOUNTER — Ambulatory Visit: Payer: Medicare Other | Admitting: *Deleted

## 2017-08-27 ENCOUNTER — Encounter (INDEPENDENT_AMBULATORY_CARE_PROVIDER_SITE_OTHER): Payer: Self-pay

## 2017-08-27 DIAGNOSIS — I4891 Unspecified atrial fibrillation: Secondary | ICD-10-CM | POA: Diagnosis not present

## 2017-08-27 DIAGNOSIS — I824Y9 Acute embolism and thrombosis of unspecified deep veins of unspecified proximal lower extremity: Secondary | ICD-10-CM | POA: Diagnosis not present

## 2017-08-27 DIAGNOSIS — D6859 Other primary thrombophilia: Secondary | ICD-10-CM | POA: Diagnosis not present

## 2017-08-27 DIAGNOSIS — Z953 Presence of xenogenic heart valve: Secondary | ICD-10-CM | POA: Diagnosis not present

## 2017-08-27 DIAGNOSIS — Z5181 Encounter for therapeutic drug level monitoring: Secondary | ICD-10-CM

## 2017-08-27 LAB — POCT INR: INR: 2.4 (ref 2.0–3.0)

## 2017-08-27 NOTE — Patient Instructions (Signed)
Description   Continue on same dosage of Coumadin 1/2 tablet daily except 1 tablet on Mondays and Fridays. Recheck INR in 6 weeks. Please call [phone] with any new medications or if scheduled for any procedures.      

## 2017-10-08 ENCOUNTER — Ambulatory Visit: Payer: Medicare Other | Admitting: *Deleted

## 2017-10-08 DIAGNOSIS — D6859 Other primary thrombophilia: Secondary | ICD-10-CM

## 2017-10-08 DIAGNOSIS — I824Y9 Acute embolism and thrombosis of unspecified deep veins of unspecified proximal lower extremity: Secondary | ICD-10-CM

## 2017-10-08 DIAGNOSIS — I4891 Unspecified atrial fibrillation: Secondary | ICD-10-CM | POA: Diagnosis not present

## 2017-10-08 DIAGNOSIS — Z5181 Encounter for therapeutic drug level monitoring: Secondary | ICD-10-CM | POA: Diagnosis not present

## 2017-10-08 DIAGNOSIS — Z953 Presence of xenogenic heart valve: Secondary | ICD-10-CM

## 2017-10-08 LAB — POCT INR: INR: 2.2 (ref 2.0–3.0)

## 2017-10-08 NOTE — Patient Instructions (Signed)
Description   Continue on same dosage of Coumadin 1/2 tablet daily except 1 tablet on Mondays and Fridays. Recheck INR in 6 weeks. Please call [phone] with any new medications or if scheduled for any procedures.      

## 2017-11-05 ENCOUNTER — Other Ambulatory Visit: Payer: Self-pay | Admitting: Cardiology

## 2017-11-19 ENCOUNTER — Ambulatory Visit: Payer: Medicare Other

## 2017-11-19 DIAGNOSIS — Z953 Presence of xenogenic heart valve: Secondary | ICD-10-CM

## 2017-11-19 DIAGNOSIS — I824Y9 Acute embolism and thrombosis of unspecified deep veins of unspecified proximal lower extremity: Secondary | ICD-10-CM | POA: Diagnosis not present

## 2017-11-19 DIAGNOSIS — Z5181 Encounter for therapeutic drug level monitoring: Secondary | ICD-10-CM

## 2017-11-19 DIAGNOSIS — D6859 Other primary thrombophilia: Secondary | ICD-10-CM | POA: Diagnosis not present

## 2017-11-19 DIAGNOSIS — I4891 Unspecified atrial fibrillation: Secondary | ICD-10-CM | POA: Diagnosis not present

## 2017-11-19 LAB — POCT INR: INR: 2.8 (ref 2.0–3.0)

## 2017-11-19 NOTE — Patient Instructions (Signed)
Description   Continue on same dosage of Coumadin 1/2 tablet daily except 1 tablet on Mondays and Fridays. Recheck INR in 6 weeks. Please call [phone] with any new medications or if scheduled for any procedures.      

## 2017-12-31 ENCOUNTER — Encounter (INDEPENDENT_AMBULATORY_CARE_PROVIDER_SITE_OTHER): Payer: Self-pay

## 2017-12-31 ENCOUNTER — Ambulatory Visit: Payer: Medicare Other | Admitting: *Deleted

## 2017-12-31 DIAGNOSIS — I824Y9 Acute embolism and thrombosis of unspecified deep veins of unspecified proximal lower extremity: Secondary | ICD-10-CM

## 2017-12-31 DIAGNOSIS — I4891 Unspecified atrial fibrillation: Secondary | ICD-10-CM

## 2017-12-31 DIAGNOSIS — D6859 Other primary thrombophilia: Secondary | ICD-10-CM

## 2017-12-31 DIAGNOSIS — Z5181 Encounter for therapeutic drug level monitoring: Secondary | ICD-10-CM

## 2017-12-31 DIAGNOSIS — Z953 Presence of xenogenic heart valve: Secondary | ICD-10-CM

## 2017-12-31 LAB — POCT INR: INR: 2.5 (ref 2.0–3.0)

## 2017-12-31 NOTE — Patient Instructions (Signed)
Description   Continue on same dosage of Coumadin 1/2 tablet daily except 1 tablet on Mondays and Fridays. Recheck INR in 6 weeks. Please call [phone] with any new medications or if scheduled for any procedures.      

## 2018-02-14 ENCOUNTER — Ambulatory Visit: Payer: Medicare Other | Admitting: *Deleted

## 2018-02-14 DIAGNOSIS — Z953 Presence of xenogenic heart valve: Secondary | ICD-10-CM

## 2018-02-14 DIAGNOSIS — Z5181 Encounter for therapeutic drug level monitoring: Secondary | ICD-10-CM

## 2018-02-14 DIAGNOSIS — I4891 Unspecified atrial fibrillation: Secondary | ICD-10-CM

## 2018-02-14 DIAGNOSIS — D6859 Other primary thrombophilia: Secondary | ICD-10-CM

## 2018-02-14 DIAGNOSIS — I824Y9 Acute embolism and thrombosis of unspecified deep veins of unspecified proximal lower extremity: Secondary | ICD-10-CM

## 2018-02-14 LAB — POCT INR: INR: 2.3 (ref 2.0–3.0)

## 2018-02-14 NOTE — Patient Instructions (Signed)
Description   Continue on same dosage of Coumadin 1/2 tablet daily except 1 tablet on Mondays and Fridays. Recheck INR in 6 weeks. Please call [phone] with any new medications or if scheduled for any procedures.

## 2018-03-11 MED FILL — HYDROCODONE-CHLORPHEN ER SU: 10-8 | 5 days supply | Qty: 50 | Fill #0

## 2018-03-11 MED FILL — DOXYCYCLINE HYC 100 MG CAPS: 100 | 10 days supply | Qty: 20 | Fill #0

## 2018-03-11 MED FILL — predniSONE 20 MG TABS: 20 | 6 days supply | Qty: 12 | Fill #0

## 2018-03-19 ENCOUNTER — Other Ambulatory Visit: Payer: Self-pay | Admitting: Internal Medicine

## 2018-03-28 ENCOUNTER — Encounter (INDEPENDENT_AMBULATORY_CARE_PROVIDER_SITE_OTHER): Payer: Self-pay

## 2018-03-28 ENCOUNTER — Ambulatory Visit: Payer: Medicare Other | Admitting: *Deleted

## 2018-03-28 DIAGNOSIS — D6859 Other primary thrombophilia: Secondary | ICD-10-CM

## 2018-03-28 DIAGNOSIS — Z953 Presence of xenogenic heart valve: Secondary | ICD-10-CM

## 2018-03-28 DIAGNOSIS — Z5181 Encounter for therapeutic drug level monitoring: Secondary | ICD-10-CM | POA: Diagnosis not present

## 2018-03-28 DIAGNOSIS — I4891 Unspecified atrial fibrillation: Secondary | ICD-10-CM | POA: Diagnosis not present

## 2018-03-28 DIAGNOSIS — I824Y9 Acute embolism and thrombosis of unspecified deep veins of unspecified proximal lower extremity: Secondary | ICD-10-CM | POA: Diagnosis not present

## 2018-03-28 LAB — POCT INR: INR: 3 (ref 2.0–3.0)

## 2018-03-28 NOTE — Patient Instructions (Signed)
Description   Continue on same dosage of Coumadin 1/2 tablet daily except 1 tablet on Mondays and Fridays. Recheck INR in 6 weeks. Please call [phone] with any new medications or if scheduled for any procedures.

## 2018-05-09 ENCOUNTER — Ambulatory Visit (INDEPENDENT_AMBULATORY_CARE_PROVIDER_SITE_OTHER): Payer: Medicare Other | Admitting: Pharmacist

## 2018-05-09 DIAGNOSIS — Z5181 Encounter for therapeutic drug level monitoring: Secondary | ICD-10-CM | POA: Diagnosis not present

## 2018-05-09 DIAGNOSIS — I4891 Unspecified atrial fibrillation: Secondary | ICD-10-CM | POA: Diagnosis not present

## 2018-05-09 DIAGNOSIS — I824Y9 Acute embolism and thrombosis of unspecified deep veins of unspecified proximal lower extremity: Secondary | ICD-10-CM

## 2018-05-09 DIAGNOSIS — Z953 Presence of xenogenic heart valve: Secondary | ICD-10-CM

## 2018-05-09 DIAGNOSIS — D6859 Other primary thrombophilia: Secondary | ICD-10-CM

## 2018-05-09 LAB — POCT INR: INR: 2.9 (ref 2.0–3.0)

## 2018-05-09 NOTE — Patient Instructions (Signed)
Description   Continue on same dosage of Coumadin 1/2 tablet daily except 1 tablet on Mondays and Fridays. Recheck INR in 6 weeks. Please call [phone] with any new medications or if scheduled for any procedures.

## 2018-06-19 ENCOUNTER — Telehealth: Payer: Self-pay

## 2018-06-19 NOTE — Telephone Encounter (Signed)
lmom for prescreen  

## 2018-06-20 ENCOUNTER — Ambulatory Visit (INDEPENDENT_AMBULATORY_CARE_PROVIDER_SITE_OTHER): Payer: Medicare Other | Admitting: Pharmacist

## 2018-06-20 ENCOUNTER — Other Ambulatory Visit: Payer: Self-pay

## 2018-06-20 DIAGNOSIS — Z5181 Encounter for therapeutic drug level monitoring: Secondary | ICD-10-CM

## 2018-06-20 DIAGNOSIS — D6859 Other primary thrombophilia: Secondary | ICD-10-CM

## 2018-06-20 DIAGNOSIS — I4891 Unspecified atrial fibrillation: Secondary | ICD-10-CM | POA: Diagnosis not present

## 2018-06-20 DIAGNOSIS — I824Y9 Acute embolism and thrombosis of unspecified deep veins of unspecified proximal lower extremity: Secondary | ICD-10-CM

## 2018-06-20 DIAGNOSIS — Z953 Presence of xenogenic heart valve: Secondary | ICD-10-CM

## 2018-06-20 LAB — POCT INR: INR: 2.6 (ref 2.0–3.0)

## 2018-07-01 ENCOUNTER — Telehealth: Payer: Self-pay | Admitting: Internal Medicine

## 2018-07-01 NOTE — Telephone Encounter (Signed)
New message   [facility] for pt to call back about appt on 05.05.20 with Dr. Lovena Le. Will offer pt virtual visit-doxmeity if pt has smart phone, phone call if only landline.

## 2018-07-03 NOTE — Telephone Encounter (Signed)
Follow up     Spoke with pt about appt scheduled for 05.05.20 with Dr. Lovena Le. Pt was not interested in a virtual visit, pt would prefer to come into the office. Pt rescheduled to Aug 5th.

## 2018-07-09 ENCOUNTER — Ambulatory Visit: Payer: Medicare Other | Admitting: Internal Medicine

## 2018-08-07 ENCOUNTER — Other Ambulatory Visit: Payer: Self-pay | Admitting: Internal Medicine

## 2018-08-22 ENCOUNTER — Telehealth: Payer: Self-pay

## 2018-08-22 NOTE — Telephone Encounter (Signed)

## 2018-08-29 ENCOUNTER — Other Ambulatory Visit: Payer: Self-pay

## 2018-08-29 ENCOUNTER — Encounter (INDEPENDENT_AMBULATORY_CARE_PROVIDER_SITE_OTHER): Payer: Self-pay

## 2018-08-29 ENCOUNTER — Ambulatory Visit (INDEPENDENT_AMBULATORY_CARE_PROVIDER_SITE_OTHER): Payer: Medicare Other | Admitting: *Deleted

## 2018-08-29 DIAGNOSIS — I4891 Unspecified atrial fibrillation: Secondary | ICD-10-CM

## 2018-08-29 DIAGNOSIS — Z5181 Encounter for therapeutic drug level monitoring: Secondary | ICD-10-CM | POA: Diagnosis not present

## 2018-08-29 LAB — POCT INR: INR: 2.8 (ref 2.0–3.0)

## 2018-08-29 NOTE — Patient Instructions (Signed)
Description   Continue on same dosage of Coumadin 1/2 tablet daily except 1 tablet on Mondays and Fridays. Recheck INR in 8 weeks. Please call [phone] with any new medications or if scheduled for any procedures.

## 2018-10-09 ENCOUNTER — Ambulatory Visit (INDEPENDENT_AMBULATORY_CARE_PROVIDER_SITE_OTHER): Payer: Medicare Other | Admitting: Internal Medicine

## 2018-10-09 ENCOUNTER — Other Ambulatory Visit: Payer: Self-pay

## 2018-10-09 ENCOUNTER — Telehealth: Payer: Self-pay | Admitting: Radiology

## 2018-10-09 ENCOUNTER — Encounter: Payer: Self-pay | Admitting: Internal Medicine

## 2018-10-09 ENCOUNTER — Telehealth: Payer: Self-pay

## 2018-10-09 VITALS — BP 136/84 | HR 43 | Ht 74.0 in | Wt 192.0 lb

## 2018-10-09 DIAGNOSIS — I459 Conduction disorder, unspecified: Secondary | ICD-10-CM

## 2018-10-09 DIAGNOSIS — I10 Essential (primary) hypertension: Secondary | ICD-10-CM

## 2018-10-09 DIAGNOSIS — I2510 Atherosclerotic heart disease of native coronary artery without angina pectoris: Secondary | ICD-10-CM | POA: Diagnosis not present

## 2018-10-09 DIAGNOSIS — I4892 Unspecified atrial flutter: Secondary | ICD-10-CM | POA: Insufficient documentation

## 2018-10-09 DIAGNOSIS — I480 Paroxysmal atrial fibrillation: Secondary | ICD-10-CM | POA: Diagnosis not present

## 2018-10-09 NOTE — Progress Notes (Signed)
HPI [name] returns today for followup of cryptogenic stroke, s/p ILR insertion. He has been found to have atrial fib and is anti-coagulated. In the interim, he denies syncope but does note some dizzy spells. He does not have palpitations. He denies sob. No chest pain. No edema. Allergies  Allergen Reactions  . Sudafed [Pseudoephedrine Hcl] Palpitations  . Cardizem [Diltiazem Hcl] Rash     Current Outpatient Medications  Medication Sig Dispense Refill  . acetaminophen (TYLENOL) 500 MG tablet Take 1,000 mg by mouth every 6 (six) hours as needed for mild pain.    Marland Kitchen aspirin EC 81 MG EC tablet Take 1 tablet (81 mg total) by mouth daily.    . carvedilol (COREG) 3.125 MG tablet Take 3.125 mg by mouth 2 (two) times daily with a meal.    . docusate sodium (COLACE) 100 MG capsule Take 100 mg by mouth daily as needed for mild constipation. Takes as needed about 3 times a week    . meclizine (ANTIVERT) 25 MG tablet Take 25 mg by mouth 3 (three) times daily as needed for dizziness.    . Multiple Vitamins-Minerals (STRESS B COMPLEX/ZINC PO) Take 1 tablet by mouth daily.     Marland Kitchen omeprazole (PRILOSEC) 20 MG capsule Take 20 mg by mouth daily as needed (for acid reduction).     Vladimir Faster Glycol-Propyl Glycol (SYSTANE OP) Place 1 drop into both eyes daily as needed (for dry eyes).     . rosuvastatin (CRESTOR) 10 MG tablet Take 10 mg by mouth daily.    . tamsulosin (FLOMAX) 0.4 MG CAPS capsule Take 0.4 mg by mouth as directed.    . warfarin (COUMADIN) 5 MG tablet TAKE AS DIRECTED BY COUMADIN CLINIC 70 tablet 0   No current facility-administered medications for this visit.      Past Medical History:  Diagnosis Date  . AORTIC STENOSIS    a. Echo (05/26/13):  Mod LVH, vigorous LVF, EF 65-70%, no RWMA, Gr 1 DD, severe AS (mean 40 mmHg) => s/p bioprosthetic AVR 07/2013  . Arthritis    BACK  . CAD (coronary artery disease)    a. Lexiscan myoview (5/13) with EF 70%, no ischemia or infarction;   b.  LHC (06/2013):  Dist LM 30%, ostial LAD 40-50%, prox LAD 30%, ostial D1 80-90%, prox CFX 40%, RCA occluded with L-R collats => CABG (S-PDA at time of AVR)  . CVA (cerebral infarction)    a. 3/14 right parietal infarct. Carotid US 3/14 showed no significant disease. He has an implanted loop recorder to look for atrial fibrillation.   . DYSPEPSIA   . ED (erectile dysfunction)   . Former smoker   . GERD (gastroesophageal reflux disease)   . History of kidney stones   . HYPERLIPIDEMIA   . HYPERTENSION   . Palpitations    a. Event monitor (5/13) with occasional PVCs, PACs but no significant arrhythmia.   . S/P aortic valve replacement with bioprosthetic valve + CABG x1 07/11/2013   23 mm [facility] Ease bovine pericardial tissue valve;  Echo (08/2013):  EF 55-60%, no RWMA, AVR ok, Asc Ao 40 mm (mildly dilated), mild LAE, mild RVE, lipomatous hypertrophy, mod TR, PASP 36 mmHg  . S/P CABG x 1 07/11/2013   SVG to PDA with EVH via right thigh  . Sinus bradycardia    a. 2013 - HR 40's in the office. BB stopped.  . Trigeminal neuropathy   . Vertigo  ROS:   All systems reviewed and negative except as noted in the HPI.   Past Surgical History:  Procedure Laterality Date  . AORTIC VALVE REPLACEMENT N/A 07/11/2013   Procedure: AORTIC VALVE REPLACEMENT (AVR);  Surgeon: [name], MD;  Location: [place];  Service: Open Heart Surgery;  Laterality: N/A;  . CATARACT EXTRACTION    . CORONARY ARTERY BYPASS GRAFT N/A 07/11/2013   Procedure: CORONARY ARTERY BYPASS GRAFTING (CABG) x1;  Surgeon: [name], MD;  Location: [place];  Service: Open Heart Surgery;  Laterality: N/A;  . INGUINAL HERNIA REPAIR Right 01/15/2013   Procedure: HERNIA REPAIR INGUINAL ADULT;  Surgeon: Harl Bowie, MD;  Location: [place];  Service: General;  Laterality: Right;  . INSERTION OF MESH Right 01/15/2013   Procedure: INSERTION OF MESH;  Surgeon: Harl Bowie, MD;  Location: [place];  Service: General;  Laterality: Right;  . INTRAOPERATIVE TRANSESOPHAGEAL ECHOCARDIOGRAM N/A 07/11/2013   Procedure: INTRAOPERATIVE TRANSESOPHAGEAL ECHOCARDIOGRAM;  Surgeon: [name], MD;  Location: [place];  Service: Open Heart Surgery;  Laterality: N/A;  . LEFT AND RIGHT HEART CATHETERIZATION WITH CORONARY ANGIOGRAM N/A 06/06/2013   Procedure: LEFT AND RIGHT HEART CATHETERIZATION WITH CORONARY ANGIOGRAM;  Surgeon: Larey Dresser, MD;  Location: [facility] CATH LAB;  Service: Cardiovascular;  Laterality: N/A;  . LITHOTRIPSY    . LOOP RECORDER IMPLANT  04/15/2013   MDT LinQ implanred by Dr Lovena Le for cryptogenic stroke  . LOOP RECORDER IMPLANT N/A 04/14/2013   Procedure: LOOP RECORDER IMPLANT;  Surgeon: Evans Lance, MD;  Location: [facility] CATH LAB;  Service: Cardiovascular;  Laterality: N/A;  . MOUTH SURGERY       Family History  Problem Relation Age of Onset  . Diabetes Mother   . Heart attack Mother   . Heart attack Father   . Hypertension Father   . Diabetes Unknown   . Hypertension Unknown   . Lung cancer Unknown   . Cancer Sister        breast  . Stroke Neg Hx      Social History   Socioeconomic History  . Marital status: Married    Spouse name: Not on file  . Number of children: Not on file  . Years of education: Not on file  . Highest education level: Not on file  Occupational History  . Not on file  Social Needs  . Financial resource strain: Not on file  . Food insecurity    Worry: Not on file    Inability: Not on file  . Transportation needs    Medical: Not on file    Non-medical: Not on file  Tobacco Use  . Smoking status: Former Smoker    Packs/day: 1.00    Years: 30.00    Pack years: 30.00    Types: Cigarettes    Quit date: 08/29/2009    Years since quitting: 9.1  . Smokeless tobacco: Never Used  Substance and Sexual Activity  . Alcohol use: No  . Drug use: No  . Sexual activity: Not Currently  Lifestyle  . Physical activity    Days per week: Not  on file    Minutes per session: Not on file  . Stress: Not on file  Relationships  . Social Herbalist on phone: Not on file    Gets together: Not on file    Attends religious service: Not on file    Active member of club  or organization: Not on file    Attends meetings of clubs or organizations: Not on file    Relationship status: Not on file  . Intimate partner violence    Fear of current or ex partner: Not on file    Emotionally abused: Not on file    Physically abused: Not on file    Forced sexual activity: Not on file  Other Topics Concern  . Not on file  Social History Narrative   He resides in [place] with his wife and son. 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  No    grandchildren. He smokes half a pack per day intermittently for the last 40    years. He drinks a glass of homemade wine mixed with diet Coke one time per    week. He denies any drugs, herbal medication, diet, or exercise program.           BP 136/84   Pulse (!) 43   Ht 6\' 2"  (1.88 m)   Wt 192 lb (87.1 kg)   SpO2 98%   BMI 24.65 kg/m   Physical Exam:  Well appearing NAD HEENT: Unremarkable Neck:  No JVD, no thyromegally Lymphatics:  No adenopathy Back:  No CVA tenderness Lungs:  Clear with no wheezes HEART:  Regular brady rhythm, no murmurs, no rubs, no clicks Abd:  soft, positive bowel sounds, no organomegally, no rebound, no guarding Ext:  2 plus pulses, no edema, no cyanosis, no clubbing Skin:  No rashes no nodules Neuro:  CN II through XII intact, motor grossly intact  EKG - atrial flutter with a slow VR  Assess/Plan: 1. Atrial flutter - this is a new problem. He has had prior atrial fib. He is asymptomatic as far as palpitations are concerned. He will continue warfarin and wear a heart monitor. I discussed catheter ablation. We will undergo watchful waiting. 2. Heart block - he has 5:1 AV conduction in atrial flutter today. We will ask  him to stop his coreg. My biggest concern with catheter ablation of atrial flutter is that he has underlying high grade heart block.  3. Stroke - he will continue warfarin. No additional symptoms. He will need life long anti-coagulation. 4. Dizziness - he thinks that this is due to vertigo. I suspect he is having pauses. We will check a 48 hour monitor. Mikle Bosworth.D.

## 2018-10-09 NOTE — Telephone Encounter (Signed)
Enrolled patient for a 3 day Zio monitor to be mailed. Brief instructions were gone over with the patient and he knows to expect the monitor to arrive in 3-4 days.

## 2018-10-09 NOTE — Telephone Encounter (Signed)
Left message for Pt.  Pt to discontinue carvedilol.

## 2018-10-09 NOTE — Patient Instructions (Addendum)
Medication Instructions:  Your physician recommends that you continue on your current medications as directed. Please refer to the Current Medication list given to you today.  Possible options for treatment:  1.  Catheter ablation with possible pacemaker placement   2.  48 hour monitor   Labwork: None ordered.  Testing/Procedures: None ordered.  Follow-Up: Your physician wants you to follow-up in: one month with Dr. Lovena Le.       Any Other Special Instructions Will Be Listed Below (If Applicable).  If you need a refill on your cardiac medications before your next appointment, please call your pharmacy.

## 2018-10-14 ENCOUNTER — Other Ambulatory Visit (INDEPENDENT_AMBULATORY_CARE_PROVIDER_SITE_OTHER): Payer: Medicare Other

## 2018-10-14 DIAGNOSIS — I459 Conduction disorder, unspecified: Secondary | ICD-10-CM | POA: Diagnosis not present

## 2018-10-14 DIAGNOSIS — I4892 Unspecified atrial flutter: Secondary | ICD-10-CM | POA: Diagnosis not present

## 2018-10-22 ENCOUNTER — Telehealth: Payer: Self-pay | Admitting: *Deleted

## 2018-10-22 NOTE — Telephone Encounter (Signed)
Received call from [facility] with [employer]IO Patch who reports an end of service report demonstrating 100% At Flutter with slowest rate of 37 bpm for 60 seconds.  There was one (3) second pause.  This was a 3 day monitor and per Raquel Sarna is available for review/to be read.  Will forward to Dr Lovena Le for his information.

## 2018-10-24 ENCOUNTER — Ambulatory Visit (INDEPENDENT_AMBULATORY_CARE_PROVIDER_SITE_OTHER): Payer: Medicare Other | Admitting: *Deleted

## 2018-10-24 ENCOUNTER — Other Ambulatory Visit: Payer: Self-pay

## 2018-10-24 DIAGNOSIS — I480 Paroxysmal atrial fibrillation: Secondary | ICD-10-CM

## 2018-10-24 DIAGNOSIS — Z5181 Encounter for therapeutic drug level monitoring: Secondary | ICD-10-CM | POA: Diagnosis not present

## 2018-10-24 DIAGNOSIS — I4891 Unspecified atrial fibrillation: Secondary | ICD-10-CM | POA: Diagnosis not present

## 2018-10-24 LAB — POCT INR: INR: 3.2 — AB (ref 2.0–3.0)

## 2018-10-24 NOTE — Patient Instructions (Signed)
Description    Hold today, then continue on same dosage of Coumadin 1/2 tablet daily except 1 tablet on Mondays and Fridays. Recheck INR in 3 weeks. Please call [phone] with any new medications or if scheduled for any procedures.

## 2018-10-27 NOTE — Telephone Encounter (Signed)
Reviewed. He has been recommended for atrial flutter ablation and PPM. GT

## 2018-10-31 ENCOUNTER — Ambulatory Visit (INDEPENDENT_AMBULATORY_CARE_PROVIDER_SITE_OTHER): Payer: Medicare Other | Admitting: Internal Medicine

## 2018-10-31 ENCOUNTER — Other Ambulatory Visit: Payer: Self-pay

## 2018-10-31 ENCOUNTER — Telehealth: Payer: Self-pay | Admitting: Internal Medicine

## 2018-10-31 ENCOUNTER — Encounter: Payer: Self-pay | Admitting: Internal Medicine

## 2018-10-31 VITALS — BP 142/88 | HR 55 | Ht 75.0 in | Wt 196.6 lb

## 2018-10-31 DIAGNOSIS — I10 Essential (primary) hypertension: Secondary | ICD-10-CM | POA: Diagnosis not present

## 2018-10-31 DIAGNOSIS — I480 Paroxysmal atrial fibrillation: Secondary | ICD-10-CM | POA: Diagnosis not present

## 2018-10-31 DIAGNOSIS — I2510 Atherosclerotic heart disease of native coronary artery without angina pectoris: Secondary | ICD-10-CM

## 2018-10-31 DIAGNOSIS — Z953 Presence of xenogenic heart valve: Secondary | ICD-10-CM

## 2018-10-31 NOTE — Progress Notes (Signed)
HPI [name] returns today for followup. He is a pleasant 84 yo man with a h/o valvular heart disease, HTN, newly diagnosed atrial flutter, and bradycardia with pauses and dizziness. In the interim, the patient has not had syncope. He wore a cardiac monitor which demonstrated pauses during the day of 3 seconds and HR's in the 35-40 range. He has dizzy spells.  Allergies  Allergen Reactions  . Sudafed [Pseudoephedrine Hcl] Palpitations  . Cardizem [Diltiazem Hcl] Rash     Current Outpatient Medications  Medication Sig Dispense Refill  . acetaminophen (TYLENOL) 500 MG tablet Take 1,000 mg by mouth every 6 (six) hours as needed for mild pain.    Marland Kitchen aspirin EC 81 MG EC tablet Take 1 tablet (81 mg total) by mouth daily.    . carvedilol (COREG) 3.125 MG tablet Take 3.125 mg by mouth 2 (two) times daily with a meal.    . docusate sodium (COLACE) 100 MG capsule Take 100 mg by mouth daily as needed for mild constipation. Takes as needed about 3 times a week    . meclizine (ANTIVERT) 25 MG tablet Take 25 mg by mouth 3 (three) times daily as needed for dizziness.    . Multiple Vitamins-Minerals (STRESS B COMPLEX/ZINC PO) Take 1 tablet by mouth daily.     Marland Kitchen omeprazole (PRILOSEC) 20 MG capsule Take 20 mg by mouth daily as needed (for acid reduction).     Vladimir Faster Glycol-Propyl Glycol (SYSTANE OP) Place 1 drop into both eyes daily as needed (for dry eyes).     . rosuvastatin (CRESTOR) 10 MG tablet Take 10 mg by mouth daily.    . tamsulosin (FLOMAX) 0.4 MG CAPS capsule Take 0.4 mg by mouth as directed.    . warfarin (COUMADIN) 5 MG tablet TAKE AS DIRECTED BY COUMADIN CLINIC 70 tablet 0   No current facility-administered medications for this visit.      Past Medical History:  Diagnosis Date  . AORTIC STENOSIS    a. Echo (05/26/13):  Mod LVH, vigorous LVF, EF 65-70%, no RWMA, Gr 1 DD, severe AS (mean 40 mmHg) => s/p bioprosthetic AVR 07/2013  . Arthritis    BACK  . CAD (coronary artery  disease)    a. Lexiscan myoview (5/13) with EF 70%, no ischemia or infarction;   b. LHC (06/2013):  Dist LM 30%, ostial LAD 40-50%, prox LAD 30%, ostial D1 80-90%, prox CFX 40%, RCA occluded with L-R collats => CABG (S-PDA at time of AVR)  . CVA (cerebral infarction)    a. 3/14 right parietal infarct. Carotid US 3/14 showed no significant disease. He has an implanted loop recorder to look for atrial fibrillation.   . DYSPEPSIA   . ED (erectile dysfunction)   . Former smoker   . GERD (gastroesophageal reflux disease)   . History of kidney stones   . HYPERLIPIDEMIA   . HYPERTENSION   . Palpitations    a. Event monitor (5/13) with occasional PVCs, PACs but no significant arrhythmia.   . S/P aortic valve replacement with bioprosthetic valve + CABG x1 07/11/2013   23 mm [facility] Ease bovine pericardial tissue valve;  Echo (08/2013):  EF 55-60%, no RWMA, AVR ok, Asc Ao 40 mm (mildly dilated), mild LAE, mild RVE, lipomatous hypertrophy, mod TR, PASP 36 mmHg  . S/P CABG x 1 07/11/2013   SVG to PDA with EVH via right thigh  . Sinus bradycardia    a. 2013 - HR 40's  in the office. BB stopped.  . Trigeminal neuropathy   . Vertigo     ROS:   All systems reviewed and negative except as noted in the HPI.   Past Surgical History:  Procedure Laterality Date  . AORTIC VALVE REPLACEMENT N/A 07/11/2013   Procedure: AORTIC VALVE REPLACEMENT (AVR);  Surgeon: [name], MD;  Location: [place];  Service: Open Heart Surgery;  Laterality: N/A;  . CATARACT EXTRACTION    . CORONARY ARTERY BYPASS GRAFT N/A 07/11/2013   Procedure: CORONARY ARTERY BYPASS GRAFTING (CABG) x1;  Surgeon: [name], MD;  Location: [place];  Service: Open Heart Surgery;  Laterality: N/A;  . INGUINAL HERNIA REPAIR Right 01/15/2013   Procedure: HERNIA REPAIR INGUINAL ADULT;  Surgeon: Harl Bowie, MD;  Location: [place];  Service: General;  Laterality: Right;  . INSERTION OF MESH Right 01/15/2013    Procedure: INSERTION OF MESH;  Surgeon: Harl Bowie, MD;  Location: [place];  Service: General;  Laterality: Right;  . INTRAOPERATIVE TRANSESOPHAGEAL ECHOCARDIOGRAM N/A 07/11/2013   Procedure: INTRAOPERATIVE TRANSESOPHAGEAL ECHOCARDIOGRAM;  Surgeon: [name], MD;  Location: [place];  Service: Open Heart Surgery;  Laterality: N/A;  . LEFT AND RIGHT HEART CATHETERIZATION WITH CORONARY ANGIOGRAM N/A 06/06/2013   Procedure: LEFT AND RIGHT HEART CATHETERIZATION WITH CORONARY ANGIOGRAM;  Surgeon: Larey Dresser, MD;  Location: [facility] CATH LAB;  Service: Cardiovascular;  Laterality: N/A;  . LITHOTRIPSY    . LOOP RECORDER IMPLANT  04/15/2013   MDT LinQ implanred by Dr Lovena Le for cryptogenic stroke  . LOOP RECORDER IMPLANT N/A 04/14/2013   Procedure: LOOP RECORDER IMPLANT;  Surgeon: Evans Lance, MD;  Location: [facility] CATH LAB;  Service: Cardiovascular;  Laterality: N/A;  . MOUTH SURGERY       Family History  Problem Relation Age of Onset  . Diabetes Mother   . Heart attack Mother   . Heart attack Father   . Hypertension Father   . Diabetes Unknown   . Hypertension Unknown   . Lung cancer Unknown   . Cancer Sister        breast  . Stroke Neg Hx      Social History   Socioeconomic History  . Marital status: Married    Spouse name: Not on file  . Number of children: Not on file  . Years of education: Not on file  . Highest education level: Not on file  Occupational History  . Not on file  Social Needs  . Financial resource strain: Not on file  . Food insecurity    Worry: Not on file    Inability: Not on file  . Transportation needs    Medical: Not on file    Non-medical: Not on file  Tobacco Use  . Smoking status: Former Smoker    Packs/day: 1.00    Years: 30.00    Pack years: 30.00    Types: Cigarettes    Quit date: 08/29/2009    Years since quitting: 9.1  . Smokeless tobacco: Never Used  Substance and Sexual Activity  . Alcohol use: No  . Drug use: No   . Sexual activity: Not Currently  Lifestyle  . Physical activity    Days per week: Not on file    Minutes per session: Not on file  . Stress: Not on file  Relationships  . Social Herbalist on phone: Not on file    Gets together: Not on  file    Attends religious service: Not on file    Active member of club or organization: Not on file    Attends meetings of clubs or organizations: Not on file    Relationship status: Not on file  . Intimate partner violence    Fear of current or ex partner: Not on file    Emotionally abused: Not on file    Physically abused: Not on file    Forced sexual activity: Not on file  Other Topics Concern  . Not on file  Social History Narrative   He resides in [place] with his wife and son. 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  No    grandchildren. He smokes half a pack per day intermittently for the last 40    years. He drinks a glass of homemade wine mixed with diet Coke one time per    week. He denies any drugs, herbal medication, diet, or exercise program.           BP (!) 142/88   Pulse (!) 55   Ht 6\' 3"  (1.905 m)   Wt 196 lb 9.6 oz (89.2 kg)   SpO2 97%   BMI 24.57 kg/m   Physical Exam:  Well appearing NAD HEENT: Unremarkable Neck:  No JVD, no thyromegally Lymphatics:  No adenopathy Back:  No CVA tenderness Lungs:  Clear HEART:  IRegular brady rhythm, no murmurs, no rubs, no clicks Abd:  soft, positive bowel sounds, no organomegally, no rebound, no guarding Ext:  2 plus pulses, no edema, no cyanosis, no clubbing Skin:  No rashes no nodules Neuro:  CN II through XII intact, motor grossly intact  EKG - atrial flutter with a slow VR  Assess/Plan: 1. Atrial flutter - I have discussed the risks/benefits/goals/expectations of EP study and catheter ablation and he will call us if he wishes to proceed. 2. Heart block - he has transient CHB and high grade heart block associated with  dizziness. He has not had frank syncope. I discussed PPM insertion. I have recommended catheter ablation. If he has post procedure heart block or severe sinus node dysfunction then PPM would be placed after the ablation.  3. Coags - he has been therapeutic with regard to his coumadin.   Mikle Bosworth.D.

## 2018-10-31 NOTE — Patient Instructions (Addendum)
Medication Instructions:  Your physician recommends that you continue on your current medications as directed. Please refer to the Current Medication list given to you today.  Labwork: None ordered.  Testing/Procedures: Your physician has recommended that you have an ablation. Catheter ablation is a medical procedure used to treat some cardiac arrhythmias (irregular heartbeats). During catheter ablation, a long, thin, flexible tube is put into a blood vessel in your groin (upper thigh), or neck. This tube is called an ablation catheter. It is then guided to your heart through the blood vessel. Radio frequency waves destroy small areas of heart tissue where abnormal heartbeats may cause an arrhythmia to start. Please see the instruction sheet given to you today.  Your physician has recommended that you have a pacemaker inserted. A pacemaker is a small device that is placed under the skin of your chest or abdomen to help control abnormal heart rhythms. This device uses electrical pulses to prompt the heart to beat at a normal rate. Pacemakers are used to treat heart rhythms that are too slow. Wire (leads) are attached to the pacemaker that goes into the chambers of you heart. This is done in the hospital and usually requires and overnight stay. Please see the instruction sheet given to you today for more information.   Follow-Up:  The following dates are available for procedures:  September 9, 10, 14, 17, 21, 23, 30 October 2, 5, 9, 21, 23, 26, 27   Any Other Special Instructions Will Be Listed Below (If Applicable).  If you need a refill on your cardiac medications before your next appointment, please call your pharmacy.

## 2018-10-31 NOTE — Telephone Encounter (Signed)
  Patient is ready to make appt for his procedure. They would like to set it up for Sept 9th. Please call

## 2018-10-31 NOTE — H&P (View-Only) (Signed)
HPI [name] returns today for followup. He is a pleasant 84 yo man with a h/o valvular heart disease, HTN, newly diagnosed atrial flutter, and bradycardia with pauses and dizziness. In the interim, the patient has not had syncope. He wore a cardiac monitor which demonstrated pauses during the day of 3 seconds and HR's in the 35-40 range. He has dizzy spells.  Allergies  Allergen Reactions  . Sudafed [Pseudoephedrine Hcl] Palpitations  . Cardizem [Diltiazem Hcl] Rash     Current Outpatient Medications  Medication Sig Dispense Refill  . acetaminophen (TYLENOL) 500 MG tablet Take 1,000 mg by mouth every 6 (six) hours as needed for mild pain.    Marland Kitchen aspirin EC 81 MG EC tablet Take 1 tablet (81 mg total) by mouth daily.    . carvedilol (COREG) 3.125 MG tablet Take 3.125 mg by mouth 2 (two) times daily with a meal.    . docusate sodium (COLACE) 100 MG capsule Take 100 mg by mouth daily as needed for mild constipation. Takes as needed about 3 times a week    . meclizine (ANTIVERT) 25 MG tablet Take 25 mg by mouth 3 (three) times daily as needed for dizziness.    . Multiple Vitamins-Minerals (STRESS B COMPLEX/ZINC PO) Take 1 tablet by mouth daily.     Marland Kitchen omeprazole (PRILOSEC) 20 MG capsule Take 20 mg by mouth daily as needed (for acid reduction).     Vladimir Faster Glycol-Propyl Glycol (SYSTANE OP) Place 1 drop into both eyes daily as needed (for dry eyes).     . rosuvastatin (CRESTOR) 10 MG tablet Take 10 mg by mouth daily.    . tamsulosin (FLOMAX) 0.4 MG CAPS capsule Take 0.4 mg by mouth as directed.    . warfarin (COUMADIN) 5 MG tablet TAKE AS DIRECTED BY COUMADIN CLINIC 70 tablet 0   No current facility-administered medications for this visit.      Past Medical History:  Diagnosis Date  . AORTIC STENOSIS    a. Echo (05/26/13):  Mod LVH, vigorous LVF, EF 65-70%, no RWMA, Gr 1 DD, severe AS (mean 40 mmHg) => s/p bioprosthetic AVR 07/2013  . Arthritis    BACK  . CAD (coronary artery  disease)    a. Lexiscan myoview (5/13) with EF 70%, no ischemia or infarction;   b. LHC (06/2013):  Dist LM 30%, ostial LAD 40-50%, prox LAD 30%, ostial D1 80-90%, prox CFX 40%, RCA occluded with L-R collats => CABG (S-PDA at time of AVR)  . CVA (cerebral infarction)    a. 3/14 right parietal infarct. Carotid US 3/14 showed no significant disease. He has an implanted loop recorder to look for atrial fibrillation.   . DYSPEPSIA   . ED (erectile dysfunction)   . Former smoker   . GERD (gastroesophageal reflux disease)   . History of kidney stones   . HYPERLIPIDEMIA   . HYPERTENSION   . Palpitations    a. Event monitor (5/13) with occasional PVCs, PACs but no significant arrhythmia.   . S/P aortic valve replacement with bioprosthetic valve + CABG x1 07/11/2013   23 mm [facility] Ease bovine pericardial tissue valve;  Echo (08/2013):  EF 55-60%, no RWMA, AVR ok, Asc Ao 40 mm (mildly dilated), mild LAE, mild RVE, lipomatous hypertrophy, mod TR, PASP 36 mmHg  . S/P CABG x 1 07/11/2013   SVG to PDA with EVH via right thigh  . Sinus bradycardia    a. 2013 - HR 40's  in the office. BB stopped.  . Trigeminal neuropathy   . Vertigo     ROS:   All systems reviewed and negative except as noted in the HPI.   Past Surgical History:  Procedure Laterality Date  . AORTIC VALVE REPLACEMENT N/A 07/11/2013   Procedure: AORTIC VALVE REPLACEMENT (AVR);  Surgeon: [name], MD;  Location: [place];  Service: Open Heart Surgery;  Laterality: N/A;  . CATARACT EXTRACTION    . CORONARY ARTERY BYPASS GRAFT N/A 07/11/2013   Procedure: CORONARY ARTERY BYPASS GRAFTING (CABG) x1;  Surgeon: [name], MD;  Location: [place];  Service: Open Heart Surgery;  Laterality: N/A;  . INGUINAL HERNIA REPAIR Right 01/15/2013   Procedure: HERNIA REPAIR INGUINAL ADULT;  Surgeon: Harl Bowie, MD;  Location: [place];  Service: General;  Laterality: Right;  . INSERTION OF MESH Right 01/15/2013    Procedure: INSERTION OF MESH;  Surgeon: Harl Bowie, MD;  Location: [place];  Service: General;  Laterality: Right;  . INTRAOPERATIVE TRANSESOPHAGEAL ECHOCARDIOGRAM N/A 07/11/2013   Procedure: INTRAOPERATIVE TRANSESOPHAGEAL ECHOCARDIOGRAM;  Surgeon: [name], MD;  Location: [place];  Service: Open Heart Surgery;  Laterality: N/A;  . LEFT AND RIGHT HEART CATHETERIZATION WITH CORONARY ANGIOGRAM N/A 06/06/2013   Procedure: LEFT AND RIGHT HEART CATHETERIZATION WITH CORONARY ANGIOGRAM;  Surgeon: Larey Dresser, MD;  Location: [facility] CATH LAB;  Service: Cardiovascular;  Laterality: N/A;  . LITHOTRIPSY    . LOOP RECORDER IMPLANT  04/15/2013   MDT LinQ implanred by Dr Lovena Le for cryptogenic stroke  . LOOP RECORDER IMPLANT N/A 04/14/2013   Procedure: LOOP RECORDER IMPLANT;  Surgeon: Evans Lance, MD;  Location: [facility] CATH LAB;  Service: Cardiovascular;  Laterality: N/A;  . MOUTH SURGERY       Family History  Problem Relation Age of Onset  . Diabetes Mother   . Heart attack Mother   . Heart attack Father   . Hypertension Father   . Diabetes Unknown   . Hypertension Unknown   . Lung cancer Unknown   . Cancer Sister        breast  . Stroke Neg Hx      Social History   Socioeconomic History  . Marital status: Married    Spouse name: Not on file  . Number of children: Not on file  . Years of education: Not on file  . Highest education level: Not on file  Occupational History  . Not on file  Social Needs  . Financial resource strain: Not on file  . Food insecurity    Worry: Not on file    Inability: Not on file  . Transportation needs    Medical: Not on file    Non-medical: Not on file  Tobacco Use  . Smoking status: Former Smoker    Packs/day: 1.00    Years: 30.00    Pack years: 30.00    Types: Cigarettes    Quit date: 08/29/2009    Years since quitting: 9.1  . Smokeless tobacco: Never Used  Substance and Sexual Activity  . Alcohol use: No  . Drug use: No   . Sexual activity: Not Currently  Lifestyle  . Physical activity    Days per week: Not on file    Minutes per session: Not on file  . Stress: Not on file  Relationships  . Social Herbalist on phone: Not on file    Gets together: Not on  file    Attends religious service: Not on file    Active member of club or organization: Not on file    Attends meetings of clubs or organizations: Not on file    Relationship status: Not on file  . Intimate partner violence    Fear of current or ex partner: Not on file    Emotionally abused: Not on file    Physically abused: Not on file    Forced sexual activity: Not on file  Other Topics Concern  . Not on file  Social History Narrative   He resides in [place] with his wife and son. 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  No    grandchildren. He smokes half a pack per day intermittently for the last 40    years. He drinks a glass of homemade wine mixed with diet Coke one time per    week. He denies any drugs, herbal medication, diet, or exercise program.           BP (!) 142/88   Pulse (!) 55   Ht 6\' 3"  (1.905 m)   Wt 196 lb 9.6 oz (89.2 kg)   SpO2 97%   BMI 24.57 kg/m   Physical Exam:  Well appearing NAD HEENT: Unremarkable Neck:  No JVD, no thyromegally Lymphatics:  No adenopathy Back:  No CVA tenderness Lungs:  Clear HEART:  IRegular brady rhythm, no murmurs, no rubs, no clicks Abd:  soft, positive bowel sounds, no organomegally, no rebound, no guarding Ext:  2 plus pulses, no edema, no cyanosis, no clubbing Skin:  No rashes no nodules Neuro:  CN II through XII intact, motor grossly intact  EKG - atrial flutter with a slow VR  Assess/Plan: 1. Atrial flutter - I have discussed the risks/benefits/goals/expectations of EP study and catheter ablation and he will call us if he wishes to proceed. 2. Heart block - he has transient CHB and high grade heart block associated with  dizziness. He has not had frank syncope. I discussed PPM insertion. I have recommended catheter ablation. If he has post procedure heart block or severe sinus node dysfunction then PPM would be placed after the ablation.  3. Coags - he has been therapeutic with regard to his coumadin.   Mikle Bosworth.D.

## 2018-11-04 NOTE — Telephone Encounter (Signed)
Call returned to Pt.  Pt scheduled for 9/9 for procedure at 1:30 pm.  Arrival time 11:00 am to get labs prior to procedure.  Scheduled for covid test on 11/09/2018.  Verbal instruction given to Pt/wife.  All questions answered.  Work up completed.

## 2018-11-04 NOTE — Telephone Encounter (Signed)
Follow up:    Patient wife calling stating they would like for the appt to be on 11/13/18 if they can. Please call patient.

## 2018-11-05 ENCOUNTER — Telehealth: Payer: Self-pay

## 2018-11-05 NOTE — Telephone Encounter (Signed)
Left detailed message per DPR.  Advised to hold coumadin night before procedure ONLY.    Advised to call office if any questions.

## 2018-11-09 ENCOUNTER — Other Ambulatory Visit (HOSPITAL_COMMUNITY)
Admission: RE | Admit: 2018-11-09 | Discharge: 2018-11-09 | Disposition: A | Discharge status: Home / Self Care | Payer: Medicare Other | Source: Ambulatory Visit | Attending: Internal Medicine | Admitting: Internal Medicine

## 2018-11-09 DIAGNOSIS — Z01812 Encounter for preprocedural laboratory examination: Secondary | ICD-10-CM | POA: Insufficient documentation

## 2018-11-09 DIAGNOSIS — Z20828 Contact with and (suspected) exposure to other viral communicable diseases: Secondary | ICD-10-CM | POA: Insufficient documentation

## 2018-11-10 LAB — NOVEL CORONAVIRUS, NAA (HOSP ORDER, SEND-OUT TO REF LAB; TAT 18-24 HRS): SARS-CoV-2, NAA: NOT DETECTED

## 2018-11-13 ENCOUNTER — Ambulatory Visit (HOSPITAL_COMMUNITY)
Admission: RE | Disposition: A | Discharge status: Home / Self Care | Payer: Medicare Other | Source: Home / Self Care | Attending: Internal Medicine

## 2018-11-13 ENCOUNTER — Ambulatory Visit (HOSPITAL_COMMUNITY)
Admission: RE | Admit: 2018-11-13 | Discharge: 2018-11-14 | Disposition: A | Discharge status: Home / Self Care | Payer: Medicare Other | Attending: Internal Medicine | Admitting: Internal Medicine

## 2018-11-13 ENCOUNTER — Other Ambulatory Visit: Payer: Self-pay

## 2018-11-13 DIAGNOSIS — Z951 Presence of aortocoronary bypass graft: Secondary | ICD-10-CM | POA: Diagnosis not present

## 2018-11-13 DIAGNOSIS — Z7901 Long term (current) use of anticoagulants: Secondary | ICD-10-CM | POA: Insufficient documentation

## 2018-11-13 DIAGNOSIS — I483 Typical atrial flutter: Secondary | ICD-10-CM

## 2018-11-13 DIAGNOSIS — M1990 Unspecified osteoarthritis, unspecified site: Secondary | ICD-10-CM | POA: Insufficient documentation

## 2018-11-13 DIAGNOSIS — E785 Hyperlipidemia, unspecified: Secondary | ICD-10-CM | POA: Insufficient documentation

## 2018-11-13 DIAGNOSIS — Z87891 Personal history of nicotine dependence: Secondary | ICD-10-CM | POA: Insufficient documentation

## 2018-11-13 DIAGNOSIS — I10 Essential (primary) hypertension: Secondary | ICD-10-CM | POA: Diagnosis not present

## 2018-11-13 DIAGNOSIS — I2510 Atherosclerotic heart disease of native coronary artery without angina pectoris: Secondary | ICD-10-CM | POA: Insufficient documentation

## 2018-11-13 DIAGNOSIS — Z953 Presence of xenogenic heart valve: Secondary | ICD-10-CM | POA: Insufficient documentation

## 2018-11-13 DIAGNOSIS — Z7982 Long term (current) use of aspirin: Secondary | ICD-10-CM | POA: Insufficient documentation

## 2018-11-13 DIAGNOSIS — Z8249 Family history of ischemic heart disease and other diseases of the circulatory system: Secondary | ICD-10-CM | POA: Diagnosis not present

## 2018-11-13 DIAGNOSIS — Z79899 Other long term (current) drug therapy: Secondary | ICD-10-CM | POA: Insufficient documentation

## 2018-11-13 DIAGNOSIS — Z8673 Personal history of transient ischemic attack (TIA), and cerebral infarction without residual deficits: Secondary | ICD-10-CM | POA: Diagnosis not present

## 2018-11-13 DIAGNOSIS — I459 Conduction disorder, unspecified: Secondary | ICD-10-CM | POA: Diagnosis present

## 2018-11-13 DIAGNOSIS — I38 Endocarditis, valve unspecified: Secondary | ICD-10-CM | POA: Diagnosis not present

## 2018-11-13 DIAGNOSIS — K219 Gastro-esophageal reflux disease without esophagitis: Secondary | ICD-10-CM | POA: Insufficient documentation

## 2018-11-13 DIAGNOSIS — I4892 Unspecified atrial flutter: Secondary | ICD-10-CM | POA: Diagnosis present

## 2018-11-13 HISTORY — PX: LOOP RECORDER REMOVAL: EP1215

## 2018-11-13 HISTORY — PX: A-FLUTTER ABLATION: EP1230

## 2018-11-13 LAB — BASIC METABOLIC PANEL
Anion gap: 6 (ref 5–15)
BUN: 19 mg/dL (ref 8–23)
CO2: 26 mmol/L (ref 22–32)
Calcium: 9.2 mg/dL (ref 8.9–10.3)
Chloride: 109 mmol/L (ref 98–111)
Creatinine, Ser: 0.69 mg/dL (ref 0.61–1.24)
GFR calc Af Amer: >60 mL/min (ref >60)
GFR calc non Af Amer: >60 mL/min (ref >60)
Glucose, Bld: 101 mg/dL — ABNORMAL HIGH (ref 70–99)
Potassium: 4 mmol/L (ref 3.5–5.1)
Sodium: 141 mmol/L (ref 135–145)

## 2018-11-13 LAB — PROTIME-INR
INR: 2.8 — ABNORMAL HIGH (ref 0.8–1.2)
Prothrombin Time: 28.8 seconds — ABNORMAL HIGH (ref 11.4–15.2)

## 2018-11-13 LAB — CBC
HCT: 43.6 % (ref 39.0–52.0)
Hemoglobin: 14.2 g/dL (ref 13.0–17.0)
MCH: 29.4 pg (ref 26.0–34.0)
MCHC: 32.6 g/dL (ref 30.0–36.0)
MCV: 90.3 fL (ref 80.0–100.0)
Platelets: 186 10*3/uL (ref 150–400)
RBC: 4.83 MIL/uL (ref 4.22–5.81)
RDW: 13.1 % (ref 11.5–15.5)
WBC: 6.9 10*3/uL (ref 4.0–10.5)
nRBC: 0 % (ref 0.0–0.2)

## 2018-11-13 LAB — SURGICAL PCR SCREEN
MRSA, PCR: NEGATIVE
Staphylococcus aureus: NEGATIVE

## 2018-11-13 SURGERY — A-FLUTTER ABLATION

## 2018-11-13 MED ORDER — MUPIROCIN 2 % EX OINT
TOPICAL_OINTMENT | CUTANEOUS | Status: AC
  Administered 2018-11-13: 12:00:00
  Filled 2018-11-13: qty 22

## 2018-11-13 MED ORDER — POLYETHYLENE GLYCOL 3350 17 G PO PACK: 17.0000 g | PACK | Freq: Every day | ORAL | Status: DC | PRN

## 2018-11-13 MED ORDER — ACETAMINOPHEN 325 MG PO TABS
650.0000 mg | ORAL_TABLET | ORAL | Status: DC | PRN
  Administered 2018-11-13: 650 mg via ORAL
  Filled 2018-11-13: qty 2

## 2018-11-13 MED ORDER — SODIUM CHLORIDE 0.9 % IV SOLN
INTRAVENOUS | Status: DC
  Administered 2018-11-13: 12:00:00 via INTRAVENOUS

## 2018-11-13 MED ORDER — MIDAZOLAM HCL 5 MG/5ML IJ SOLN
INTRAMUSCULAR | Status: AC
  Filled 2018-11-13: qty 5

## 2018-11-13 MED ORDER — ROSUVASTATIN CALCIUM 5 MG PO TABS
10.0000 mg | ORAL_TABLET | Freq: Every evening | ORAL | Status: DC
  Administered 2018-11-13: 10 mg via ORAL
  Filled 2018-11-13: qty 2

## 2018-11-13 MED ORDER — SODIUM CHLORIDE 0.9 % IV SOLN: 80.0000 mg | INTRAVENOUS | Status: DC

## 2018-11-13 MED ORDER — SODIUM CHLORIDE 0.9% FLUSH
3.0000 mL | Freq: Two times a day (BID) | INTRAVENOUS | Status: DC
  Administered 2018-11-13: 3 mL via INTRAVENOUS

## 2018-11-13 MED ORDER — LIDOCAINE-EPINEPHRINE 1 %-1:100000 IJ SOLN
INTRAMUSCULAR | Status: DC | PRN
  Administered 2018-11-13: 10 mL

## 2018-11-13 MED ORDER — FENTANYL CITRATE (PF) 100 MCG/2ML IJ SOLN
INTRAMUSCULAR | Status: DC | PRN
  Administered 2018-11-13 (×3): 25 ug via INTRAVENOUS

## 2018-11-13 MED ORDER — HEPARIN (PORCINE) IN NACL 1000-0.9 UT/500ML-% IV SOLN
INTRAVENOUS | Status: AC
  Filled 2018-11-13: qty 500

## 2018-11-13 MED ORDER — BUPIVACAINE HCL (PF) 0.25 % IJ SOLN
INTRAMUSCULAR | Status: AC
  Filled 2018-11-13: qty 30

## 2018-11-13 MED ORDER — HEPARIN (PORCINE) IN NACL 1000-0.9 UT/500ML-% IV SOLN
INTRAVENOUS | Status: DC | PRN
  Administered 2018-11-13: 500 mL

## 2018-11-13 MED ORDER — WARFARIN - PHYSICIAN DOSING INPATIENT: Freq: Every day | Status: DC

## 2018-11-13 MED ORDER — CARVEDILOL 3.125 MG PO TABS
3.1250 mg | ORAL_TABLET | Freq: Two times a day (BID) | ORAL | Status: DC
  Administered 2018-11-14: 3.125 mg via ORAL
  Filled 2018-11-13: qty 1

## 2018-11-13 MED ORDER — FENTANYL CITRATE (PF) 100 MCG/2ML IJ SOLN
INTRAMUSCULAR | Status: AC
  Filled 2018-11-13: qty 2

## 2018-11-13 MED ORDER — CEFAZOLIN SODIUM-DEXTROSE 2-4 GM/100ML-% IV SOLN
INTRAVENOUS | Status: AC
  Filled 2018-11-13: qty 100

## 2018-11-13 MED ORDER — WARFARIN SODIUM 2.5 MG PO TABS
2.5000 mg | ORAL_TABLET | ORAL | Status: AC
  Administered 2018-11-13: 2.5 mg via ORAL
  Filled 2018-11-13: qty 1

## 2018-11-13 MED ORDER — SODIUM CHLORIDE 0.9% FLUSH: 3.0000 mL | INTRAVENOUS | Status: DC | PRN

## 2018-11-13 MED ORDER — ACETAMINOPHEN 500 MG PO TABS: 500.0000 mg | ORAL_TABLET | Freq: Four times a day (QID) | ORAL | Status: DC | PRN

## 2018-11-13 MED ORDER — HEPARIN SODIUM (PORCINE) 1000 UNIT/ML IJ SOLN
INTRAMUSCULAR | Status: DC | PRN
  Administered 2018-11-13: 1000 [IU] via INTRAVENOUS

## 2018-11-13 MED ORDER — WARFARIN SODIUM 5 MG PO TABS: 5.0000 mg | ORAL_TABLET | ORAL | Status: DC

## 2018-11-13 MED ORDER — SODIUM CHLORIDE 0.9 % IV SOLN: 250.0000 mL | INTRAVENOUS | Status: DC | PRN

## 2018-11-13 MED ORDER — MIDAZOLAM HCL 5 MG/5ML IJ SOLN
INTRAMUSCULAR | Status: DC | PRN
  Administered 2018-11-13 (×3): 2 mg via INTRAVENOUS

## 2018-11-13 MED ORDER — SODIUM CHLORIDE 0.9 % IV SOLN
INTRAVENOUS | Status: AC
  Filled 2018-11-13: qty 2

## 2018-11-13 MED ORDER — ONDANSETRON HCL 4 MG/2ML IJ SOLN: 4.0000 mg | Freq: Four times a day (QID) | INTRAMUSCULAR | Status: DC | PRN

## 2018-11-13 MED ORDER — ASPIRIN EC 81 MG PO TBEC
81.0000 mg | DELAYED_RELEASE_TABLET | Freq: Every day | ORAL | Status: DC
  Administered 2018-11-14: 81 mg via ORAL
  Filled 2018-11-13 (×2): qty 1

## 2018-11-13 MED ORDER — BUPIVACAINE HCL (PF) 0.25 % IJ SOLN
INTRAMUSCULAR | Status: DC | PRN
  Administered 2018-11-13: 30 mL

## 2018-11-13 MED ORDER — TAMSULOSIN HCL 0.4 MG PO CAPS
0.4000 mg | ORAL_CAPSULE | Freq: Every evening | ORAL | Status: DC
  Administered 2018-11-13: 0.4 mg via ORAL
  Filled 2018-11-13: qty 1

## 2018-11-13 MED ORDER — CEFAZOLIN SODIUM-DEXTROSE 2-4 GM/100ML-% IV SOLN: 2.0000 g | INTRAVENOUS | Status: DC

## 2018-11-13 MED ORDER — MECLIZINE HCL 25 MG PO TABS: 25.0000 mg | ORAL_TABLET | Freq: Three times a day (TID) | ORAL | Status: DC | PRN

## 2018-11-13 MED ORDER — CHLORHEXIDINE GLUCONATE 4 % EX LIQD
60.0000 mL | Freq: Once | CUTANEOUS | Status: DC
  Filled 2018-11-13: qty 60

## 2018-11-13 MED ORDER — WARFARIN SODIUM 2.5 MG PO TABS: 2.5000 mg | ORAL_TABLET | ORAL | Status: DC

## 2018-11-13 SURGICAL SUPPLY — 13 items
BAG SNAP BAND KOVER 36X36 (MISCELLANEOUS) ×2 IMPLANT
CATH JOSEPH QUAD ALLRED 6F REP (CATHETERS) ×2 IMPLANT
CATH SMTCH THERMOCOOL SF FJ (CATHETERS) ×2 IMPLANT
CATH WEBSTER BI DIR CS D-F CRV (CATHETERS) ×2 IMPLANT
PACK EP LATEX FREE (CUSTOM PROCEDURE TRAY) ×1
PACK EP LF (CUSTOM PROCEDURE TRAY) ×1 IMPLANT
PAD PRO RADIOLUCENT 2001M-C (PAD) ×2 IMPLANT
PATCH CARTO3 (PAD) ×2 IMPLANT
SHEATH PINNACLE 6F 10CM (SHEATH) ×2 IMPLANT
SHEATH PINNACLE 7F 10CM (SHEATH) ×2 IMPLANT
SHEATH PINNACLE 8F 10CM (SHEATH) ×2 IMPLANT
TRAY PACEMAKER INSERTION (PACKS) ×2 IMPLANT
TUBING SMART ABLATE COOLFLOW (TUBING) ×2 IMPLANT

## 2018-11-13 NOTE — Interval H&P Note (Signed)
History and Physical Interval Note:  11/13/2018 1:10 PM  Sean Brock  has presented today for surgery, with the diagnosis of aflutter - bradicardia.  The various methods of treatment have been discussed with the patient and family. After consideration of risks, benefits and other options for treatment, the patient has consented to  Procedure(s): A-FLUTTER ABLATION (N/A) PACEMAKER IMPLANT (N/A) as a surgical intervention.  The patient's history has been reviewed, patient examined, no change in status, stable for surgery.  I have reviewed the patient's chart and labs.  Questions were answered to the patient's satisfaction.     Cristopher Peru

## 2018-11-13 NOTE — Progress Notes (Addendum)
Site area: r groin. 3 venous sheaths   Site Prior to Removal: level 0   Pressure Applied For 18 minutes   Bedrest Beginning at 1745   Manual:  yes held by Suella Broad, rn    Patient Status During Pull:  stable    Post Pull Groin Site:  level 0   Post Pull Instructions Given:  yes   Post Pull Pulses Present: yes   Dressing Applied:  Gauze and Tegaderm   Comments:

## 2018-11-14 ENCOUNTER — Encounter (HOSPITAL_COMMUNITY): Payer: Self-pay | Admitting: Internal Medicine

## 2018-11-14 DIAGNOSIS — I483 Typical atrial flutter: Secondary | ICD-10-CM

## 2018-11-14 DIAGNOSIS — I2510 Atherosclerotic heart disease of native coronary artery without angina pectoris: Secondary | ICD-10-CM | POA: Diagnosis not present

## 2018-11-14 DIAGNOSIS — E785 Hyperlipidemia, unspecified: Secondary | ICD-10-CM | POA: Diagnosis not present

## 2018-11-14 DIAGNOSIS — I10 Essential (primary) hypertension: Secondary | ICD-10-CM | POA: Diagnosis not present

## 2018-11-14 MED FILL — Gentamicin Sulfate Inj 40 MG/ML: INTRAMUSCULAR | Qty: 80 | Status: AC

## 2018-11-14 MED FILL — Cefazolin Sodium-Dextrose IV Solution 2 GM/100ML-4%: INTRAVENOUS | Qty: 100 | Status: AC

## 2018-11-14 NOTE — Discharge Summary (Addendum)
ELECTROPHYSIOLOGY PROCEDURE DISCHARGE SUMMARY    Patient ID: Sean Brock,  MRN: [id number], DOB/AGE: 05-Nov-1934 84 y.o.  Admit date: 11/13/2018 Discharge date: 11/14/2018  Primary Care Physician: Gaynelle Arabian, MD Electrophysiologist: Lovena Le  Primary Discharge Diagnosis:  Atrial flutter status post ablation this admission  Secondary Discharge Diagnosis:  1.  HTN 2.  Valvular heart disease  Allergies  Allergen Reactions  . Sudafed [Pseudoephedrine Hcl] Palpitations  . Cardizem [Diltiazem Hcl] Rash     Procedures This Admission: 1.  Electrophysiology study and radiofrequency catheter ablation on 11/13/18 by Dr Lovena Le.  This study demonstrated typical atrial flutter with successful CTI ablation.  There were no inducible arrhythmias following ablation and no early apparent complications.   Brief HPI: Sean Brock is a 84 y.o. male with a past medical history as outlined above.  He has documented atrial flutter. He has been appropriately anticoagulated for >4 weeks.  Risks, benefits, and alternatives to ablation were reviewed with the patient who wished to proceed.   Hospital Course:  The patient was admitted and underwent EPS/RFCA with details as outlined above. He was monitored on telemetry overnight which demonstrated SR.  Groin and neck incisions were without complication.  They were examined by Dr Lovena Le who considered them stable for discharge to home.  Follow up will be arranged in 4 weeks.  Wound care and restrictions were reviewed with the patient prior to discharge.   Physical Exam: Vitals:   11/14/18 0130 11/14/18 0441 11/14/18 0645 11/14/18 0654  BP: 114/62 123/68    Pulse: (!) 45 (!) 51 63   Resp: 20 20    Temp: 97.6 F (36.4 C) 98.1 F (36.7 C)    TempSrc: Oral Oral    SpO2: 96% 97%    Weight:    87.2 kg  Height:        GEN- The patient is well appearing, alert and oriented x 3 today.   HEENT: normocephalic, atraumatic; sclera clear, conjunctiva  pink; hearing intact; oropharynx clear; neck supple  Lungs- Clear to ausculation bilaterally, normal work of breathing.  No wheezes, rales, rhonchi Heart- Regular rate and rhythm  GI- soft, non-tender, non-distended, bowel sounds present  Extremities- no clubbing, cyanosis, or edema;  groin without hematoma/bruit MS- no significant deformity or atrophy Skin- warm and dry, no rash or lesion Psych- euthymic mood, full affect Neuro- strength and sensation are intact   Labs:   Lab Results  Component Value Date   WBC 6.9 11/13/2018   HGB 14.2 11/13/2018   HCT 43.6 11/13/2018   MCV 90.3 11/13/2018   PLT 186 11/13/2018    Recent Labs  Lab 11/13/18 1144  NA 141  K 4.0  CL 109  CO2 26  BUN 19  CREATININE 0.69  CALCIUM 9.2  GLUCOSE 101*    Discharge Medications:  Allergies as of 11/14/2018      Reactions   Sudafed [pseudoephedrine Hcl] Palpitations   Cardizem [diltiazem Hcl] Rash      Medication List    TAKE these medications   acetaminophen 500 MG tablet Commonly known as: TYLENOL Take 500 mg by mouth every 6 (six) hours as needed for mild pain.   aspirin 81 MG EC tablet Take 1 tablet (81 mg total) by mouth daily.   carvedilol 3.125 MG tablet Commonly known as: COREG Take 3.125 mg by mouth 2 (two) times daily with a meal.   meclizine 25 MG tablet Commonly known as: ANTIVERT Take 25 mg by mouth 3 (  three) times daily as needed for dizziness.   omeprazole 20 MG capsule Commonly known as: PRILOSEC Take 20 mg by mouth daily as needed (for acid reduction).   polyethylene glycol 17 g packet Commonly known as: MIRALAX / GLYCOLAX Take 17 g by mouth daily as needed (constipation.).   rosuvastatin 10 MG tablet Commonly known as: CRESTOR Take 10 mg by mouth every evening.   STRESS B COMPLEX/ZINC PO Take 1 tablet by mouth 2 (two) times a week.   SYSTANE OP Place 1 drop into both eyes daily as needed (for dry eyes).   tamsulosin 0.4 MG Caps capsule Commonly known  as: FLOMAX Take 0.4 mg by mouth every evening.   warfarin 5 MG tablet Commonly known as: COUMADIN Take as directed. If you are unsure how to take this medication, talk to your nurse or doctor. Original instructions: TAKE AS DIRECTED BY COUMADIN CLINIC What changed: See the new instructions.       Disposition:  Discharge Instructions    Diet - low sodium heart healthy   Complete by: As directed    Increase activity slowly   Complete by: As directed      Follow-up Information    [place] Follow up on 11/21/2018.   Specialty: Cardiology Why: at [facility] for coumadin check  Contact information: [address] [place]       Evans Lance, MD Follow up on 12/11/2018.   Specialty: Cardiology Why: at 12:15PM Contact information: [address] [address] [phone]           Duration of Discharge Encounter: Greater than 30 minutes including physician time.  Signed, Chanetta Marshall, NP 11/14/2018 8:04 AM   EP Attending  Patient seen examined. Agree with the findings as noted above. The patient is doing well this morning and appears to be maintaining NSR. He is stable for DC home with followup as noted above.   Mikle Bosworth.D.

## 2018-11-14 NOTE — Discharge Instructions (Signed)
No driving for 4 days. No lifting over 5 lbs for 1 week. No sexual activity for 1 week. You may return to work in 1 week. Keep procedure site clean & dry. If you notice increased pain, swelling, bleeding or pus, call/return!  You may shower, but no soaking baths/hot tubs/pools for 1 week.  ° ° °

## 2018-11-17 ENCOUNTER — Emergency Department (HOSPITAL_COMMUNITY)
Admission: EM | Admit: 2018-11-17 | Discharge: 2018-11-17 | Disposition: A | Discharge status: Home / Self Care | Payer: Medicare Other | Attending: Emergency Medicine | Admitting: Emergency Medicine

## 2018-11-17 ENCOUNTER — Telehealth: Payer: Self-pay | Admitting: Physician Assistant

## 2018-11-17 ENCOUNTER — Other Ambulatory Visit: Payer: Self-pay

## 2018-11-17 DIAGNOSIS — Z951 Presence of aortocoronary bypass graft: Secondary | ICD-10-CM | POA: Insufficient documentation

## 2018-11-17 DIAGNOSIS — Z87891 Personal history of nicotine dependence: Secondary | ICD-10-CM | POA: Insufficient documentation

## 2018-11-17 DIAGNOSIS — Z79899 Other long term (current) drug therapy: Secondary | ICD-10-CM | POA: Insufficient documentation

## 2018-11-17 DIAGNOSIS — I2510 Atherosclerotic heart disease of native coronary artery without angina pectoris: Secondary | ICD-10-CM | POA: Insufficient documentation

## 2018-11-17 DIAGNOSIS — I10 Essential (primary) hypertension: Secondary | ICD-10-CM | POA: Insufficient documentation

## 2018-11-17 DIAGNOSIS — R42 Dizziness and giddiness: Secondary | ICD-10-CM

## 2018-11-17 DIAGNOSIS — R001 Bradycardia, unspecified: Secondary | ICD-10-CM | POA: Diagnosis not present

## 2018-11-17 DIAGNOSIS — Z20828 Contact with and (suspected) exposure to other viral communicable diseases: Secondary | ICD-10-CM | POA: Insufficient documentation

## 2018-11-17 DIAGNOSIS — Z7982 Long term (current) use of aspirin: Secondary | ICD-10-CM | POA: Diagnosis not present

## 2018-11-17 DIAGNOSIS — R0602 Shortness of breath: Secondary | ICD-10-CM | POA: Insufficient documentation

## 2018-11-17 DIAGNOSIS — Z7901 Long term (current) use of anticoagulants: Secondary | ICD-10-CM | POA: Insufficient documentation

## 2018-11-17 DIAGNOSIS — Z8673 Personal history of transient ischemic attack (TIA), and cerebral infarction without residual deficits: Secondary | ICD-10-CM | POA: Insufficient documentation

## 2018-11-17 LAB — CBC WITH DIFFERENTIAL/PLATELET
Abs Immature Granulocytes: 0.03 10*3/uL (ref 0.00–0.07)
Basophils Absolute: 0.1 10*3/uL (ref 0.0–0.1)
Basophils Relative: 1 %
Eosinophils Absolute: 0.2 10*3/uL (ref 0.0–0.5)
Eosinophils Relative: 3 %
HCT: 38.8 % — ABNORMAL LOW (ref 39.0–52.0)
Hemoglobin: 12.8 g/dL — ABNORMAL LOW (ref 13.0–17.0)
Immature Granulocytes: 0 %
Lymphocytes Relative: 20 %
Lymphs Abs: 1.4 10*3/uL (ref 0.7–4.0)
MCH: 29.9 pg (ref 26.0–34.0)
MCHC: 33 g/dL (ref 30.0–36.0)
MCV: 90.7 fL (ref 80.0–100.0)
Monocytes Absolute: 0.6 10*3/uL (ref 0.1–1.0)
Monocytes Relative: 8 %
Neutro Abs: 4.7 10*3/uL (ref 1.7–7.7)
Neutrophils Relative %: 68 %
Platelets: 177 10*3/uL (ref 150–400)
RBC: 4.28 MIL/uL (ref 4.22–5.81)
RDW: 13.2 % (ref 11.5–15.5)
WBC: 6.9 10*3/uL (ref 4.0–10.5)
nRBC: 0 % (ref 0.0–0.2)

## 2018-11-17 LAB — BASIC METABOLIC PANEL
Anion gap: 8 (ref 5–15)
BUN: 15 mg/dL (ref 8–23)
CO2: 26 mmol/L (ref 22–32)
Calcium: 9.2 mg/dL (ref 8.9–10.3)
Chloride: 106 mmol/L (ref 98–111)
Creatinine, Ser: 0.9 mg/dL (ref 0.61–1.24)
GFR calc Af Amer: >60 mL/min (ref >60)
GFR calc non Af Amer: >60 mL/min (ref >60)
Glucose, Bld: 112 mg/dL — ABNORMAL HIGH (ref 70–99)
Potassium: 4 mmol/L (ref 3.5–5.1)
Sodium: 140 mmol/L (ref 135–145)

## 2018-11-17 LAB — SARS CORONAVIRUS 2 (TAT 6-24 HRS): SARS Coronavirus 2: NEGATIVE

## 2018-11-17 LAB — MAGNESIUM: Magnesium: 1.9 mg/dL (ref 1.7–2.4)

## 2018-11-17 LAB — PROTIME-INR
INR: 2.5 — ABNORMAL HIGH (ref 0.8–1.2)
Prothrombin Time: 27 seconds — ABNORMAL HIGH (ref 11.4–15.2)

## 2018-11-17 NOTE — ED Notes (Signed)
Patient verbalizes understanding of discharge instructions. Opportunity for questioning and answers were provided. Armband removed by staff, pt discharged from ED ambulatory to home.  

## 2018-11-17 NOTE — Telephone Encounter (Signed)
Pt wife called stating her husband's heart rate is in the 40s and has been since his ablation on 11/14/18. Pt is dizzy/lightheaded and on coumadin. I noted during his hospital stay for his ablation his heart rates were in the 40-50s. She stated there was mention of a PPM, but he has not received one. She would like him to be evaluated in the ER and I agree.

## 2018-11-17 NOTE — ED Triage Notes (Signed)
Patient sent by cardiologist for bradycardia - had an ablation done on Wednesday and patient reports he's been lightheaded since Thursday. Also endorses dyspnea on exertion. Denies chest pain. Resp e/u, skin w/d.

## 2018-11-17 NOTE — ED Provider Notes (Signed)
[facility] EMERGENCY DEPARTMENT Provider Note   CSN: [id number] Arrival date & time: 11/17/18  1456     History   Chief Complaint Chief Complaint  Patient presents with  . Bradycardia    HPI Sean Brock is a 84 y.o. male.     HPI   84 year old male with dizziness.  Patient has a past history of atrial flutter and underwent CTI ablation on 11/13/2018.  About a day after the procedure she began having dizziness which she describes a sensation of feeling lightheaded and sometimes also short of breath.  Symptoms are consistently worse with activity.  Improved with rest.  His wife has been monitoring his heart rate and has been consistently in the 40s to 50s.  He reports compliance with his medications.  He denies any acute pain.  No fevers or chills.  No cough.  No unusual swelling.    Past Medical History:  Diagnosis Date  . AORTIC STENOSIS    a. Echo (05/26/13):  Mod LVH, vigorous LVF, EF 65-70%, no RWMA, Gr 1 DD, severe AS (mean 40 mmHg) => s/p bioprosthetic AVR 07/2013  . Arthritis    BACK  . CAD (coronary artery disease)    a. Lexiscan myoview (5/13) with EF 70%, no ischemia or infarction;   b. LHC (06/2013):  Dist LM 30%, ostial LAD 40-50%, prox LAD 30%, ostial D1 80-90%, prox CFX 40%, RCA occluded with L-R collats => CABG (S-PDA at time of AVR)  . CVA (cerebral infarction)    a. 3/14 right parietal infarct. Carotid US 3/14 showed no significant disease. He has an implanted loop recorder to look for atrial fibrillation.   . DYSPEPSIA   . ED (erectile dysfunction)   . Former smoker   . GERD (gastroesophageal reflux disease)   . History of kidney stones   . HYPERLIPIDEMIA   . HYPERTENSION   . Palpitations    a. Event monitor (5/13) with occasional PVCs, PACs but no significant arrhythmia.   . S/P aortic valve replacement with bioprosthetic valve + CABG x1 07/11/2013   23 mm [facility] Ease bovine pericardial tissue valve;  Echo (08/2013):  EF 55-60%, no  RWMA, AVR ok, Asc Ao 40 mm (mildly dilated), mild LAE, mild RVE, lipomatous hypertrophy, mod TR, PASP 36 mmHg  . S/P CABG x 1 07/11/2013   SVG to PDA with EVH via right thigh  . Sinus bradycardia    a. 2013 - HR 40's in the office. BB stopped.  . Trigeminal neuropathy   . Vertigo     Patient Active Problem List   Diagnosis Date Noted  . Typical atrial flutter ([place]) 11/13/2018  . Atrial flutter ([place]) 10/09/2018  . Heart block 10/09/2018  . Encounter for therapeutic drug monitoring 11/27/2016  . Atrial fibrillation [I48.91] 06/02/2014  . Acute venous embolism and thrombosis of deep vessels of proximal lower extremity [I82.4Y9] 06/02/2014  . Primary hypercoagulable state [D68.52] 06/02/2014  . S/P aortic valve replacement with bioprosthetic valve + CABG x1 07/11/2013  . S/P CABG x 1 07/11/2013  . Severe aortic stenosis 06/13/2013  . CAD (coronary artery disease)   . CVA (cerebral infarction)   . GERD (gastroesophageal reflux disease)   . H/O sinus bradycardia   . Chest pain 06/12/2013  . HYPERLIPIDEMIA 06/03/2009  . Essential hypertension 06/03/2009  . DYSPEPSIA 06/03/2009    Past Surgical History:  Procedure Laterality Date  . A-FLUTTER ABLATION N/A 11/13/2018   Procedure: A-FLUTTER ABLATION;  Surgeon: Cristopher Peru  W, MD;  Location: [place] CV LAB;  Service: Cardiovascular;  Laterality: N/A;  . AORTIC VALVE REPLACEMENT N/A 07/11/2013   Procedure: AORTIC VALVE REPLACEMENT (AVR);  Surgeon: [name], MD;  Location: [place];  Service: Open Heart Surgery;  Laterality: N/A;  . CATARACT EXTRACTION    . CORONARY ARTERY BYPASS GRAFT N/A 07/11/2013   Procedure: CORONARY ARTERY BYPASS GRAFTING (CABG) x1;  Surgeon: [name], MD;  Location: [place];  Service: Open Heart Surgery;  Laterality: N/A;  . INGUINAL HERNIA REPAIR Right 01/15/2013   Procedure: HERNIA REPAIR INGUINAL ADULT;  Surgeon: Harl Bowie, MD;  Location: [place];  Service: General;  Laterality:  Right;  . INSERTION OF MESH Right 01/15/2013   Procedure: INSERTION OF MESH;  Surgeon: Harl Bowie, MD;  Location: [place];  Service: General;  Laterality: Right;  . INTRAOPERATIVE TRANSESOPHAGEAL ECHOCARDIOGRAM N/A 07/11/2013   Procedure: INTRAOPERATIVE TRANSESOPHAGEAL ECHOCARDIOGRAM;  Surgeon: [name], MD;  Location: [place];  Service: Open Heart Surgery;  Laterality: N/A;  . LEFT AND RIGHT HEART CATHETERIZATION WITH CORONARY ANGIOGRAM N/A 06/06/2013   Procedure: LEFT AND RIGHT HEART CATHETERIZATION WITH CORONARY ANGIOGRAM;  Surgeon: Larey Dresser, MD;  Location: [facility] CATH LAB;  Service: Cardiovascular;  Laterality: N/A;  . LITHOTRIPSY    . LOOP RECORDER IMPLANT  04/15/2013   MDT LinQ implanred by Dr Lovena Le for cryptogenic stroke  . LOOP RECORDER IMPLANT N/A 04/14/2013   Procedure: LOOP RECORDER IMPLANT;  Surgeon: Evans Lance, MD;  Location: [facility] CATH LAB;  Service: Cardiovascular;  Laterality: N/A;  . LOOP RECORDER REMOVAL N/A 11/13/2018   Procedure: LOOP RECORDER REMOVAL;  Surgeon: Evans Lance, MD;  Location: [place] CV LAB;  Service: Cardiovascular;  Laterality: N/A;  . MOUTH SURGERY          Home Medications    Prior to Admission medications   Medication Sig Start Date End Date Taking? Authorizing Provider  acetaminophen (TYLENOL) 500 MG tablet Take 500 mg by mouth every 6 (six) hours as needed for mild pain.     [provider]  aspirin EC 81 MG EC tablet Take 1 tablet (81 mg total) by mouth daily. 07/15/13   Gold, Wayne E, PA-C  carvedilol (COREG) 3.125 MG tablet Take 3.125 mg by mouth 2 (two) times daily with a meal.    [provider]  meclizine (ANTIVERT) 25 MG tablet Take 25 mg by mouth 3 (three) times daily as needed for dizziness.    [provider]  Multiple Vitamins-Minerals (STRESS B COMPLEX/ZINC PO) Take 1 tablet by mouth 2 (two) times a week.     [provider]  omeprazole (PRILOSEC) 20 MG capsule Take  20 mg by mouth daily as needed (for acid reduction).     [provider]  Polyethyl Glycol-Propyl Glycol (SYSTANE OP) Place 1 drop into both eyes daily as needed (for dry eyes).     [provider]  polyethylene glycol (MIRALAX / GLYCOLAX) 17 g packet Take 17 g by mouth daily as needed (constipation.).    [provider]  rosuvastatin (CRESTOR) 10 MG tablet Take 10 mg by mouth every evening.     [provider]  tamsulosin (FLOMAX) 0.4 MG CAPS capsule Take 0.4 mg by mouth every evening.  03/18/15   [provider]  warfarin (COUMADIN) 5 MG tablet TAKE AS DIRECTED BY COUMADIN CLINIC Patient taking differently: Take 2.5-5 mg by mouth See admin instructions.  Take 1 tablet (5 mg) by mouth on Mondays & Fridays, then take 0.5 tablet (2.5 mg) by mouth all other days of the week. 08/07/18   Evans Lance, MD    Family History Family History  Problem Relation Age of Onset  . Diabetes Mother   . Heart attack Mother   . Heart attack Father   . Hypertension Father   . Diabetes Unknown   . Hypertension Unknown   . Lung cancer Unknown   . Cancer Sister        breast  . Stroke Neg Hx     Social History Social History   Tobacco Use  . Smoking status: Former Smoker    Packs/day: 1.00    Years: 30.00    Pack years: 30.00    Types: Cigarettes    Quit date: 08/29/2009    Years since quitting: 9.2  . Smokeless tobacco: Never Used  Substance Use Topics  . Alcohol use: No  . Drug use: No     Allergies   Sudafed [pseudoephedrine hcl] and Cardizem [diltiazem hcl]   Review of Systems Review of Systems  All systems reviewed and negative, other than as noted in HPI.  Physical Exam Updated Vital Signs BP (!) 158/75 (BP Location: Right Arm)   Pulse (!) 53   Temp 98.3 F (36.8 C) (Oral)   Resp 14   Ht 6\' 3"  (1.905 m)   Wt 88.5 kg   SpO2 97%   BMI 24.37 kg/m   Physical Exam Vitals signs and nursing note reviewed.  Constitutional:       General: He is not in acute distress.    Appearance: He is well-developed.  HENT:     Head: Normocephalic and atraumatic.  Eyes:     General:        Right eye: No discharge.        Left eye: No discharge.     Conjunctiva/sclera: Conjunctivae normal.  Neck:     Musculoskeletal: Neck supple.  Cardiovascular:     Rate and Rhythm: Regular rhythm. Bradycardia present.     Heart sounds: Murmur present. No friction rub. No gallop.   Pulmonary:     Effort: Pulmonary effort is normal. No respiratory distress.     Breath sounds: Normal breath sounds.  Abdominal:     General: There is no distension.     Palpations: Abdomen is soft.     Tenderness: There is no abdominal tenderness.  Musculoskeletal:        General: No tenderness.  Skin:    General: Skin is warm and dry.  Neurological:     Mental Status: He is alert.  Psychiatric:        Behavior: Behavior normal.        Thought Content: Thought content normal.      ED Treatments / Results  Labs (all labs ordered are listed, but only abnormal results are displayed) Labs Reviewed  PROTIME-INR - Abnormal; Notable for the following components:      Result Value   Prothrombin Time 27.0 (*)    INR 2.5 (*)    All other components within normal limits  CBC WITH DIFFERENTIAL/PLATELET - Abnormal; Notable for the following components:   Hemoglobin 12.8 (*)    HCT 38.8 (*)    All other components within normal limits  BASIC METABOLIC PANEL - Abnormal; Notable for the following components:   Glucose, Bld 112 (*)    All other components within normal limits  SARS CORONAVIRUS 2 (TAT 6-24 HRS)  MAGNESIUM    EKG EKG Interpretation  Date/Time:  Sunday November 17 2018 15:10:11 EDT Ventricular Rate:  49 PR Interval:    QRS Duration: 100 QT Interval:  463 QTC Calculation: 418 R Axis:   28 Text Interpretation:  Sinus bradycardia Atrial premature complex Prolonged PR interval Abnormal R-wave progression, early transition No significant  change since last tracing Confirmed by Floyd, Dan ([id number]) on 11/17/2018 3:14:34 PM   Radiology No results found.  Procedures Procedures (including critical care time)  Medications Ordered in ED Medications - No data to display   Initial Impression / Assessment and Plan / ED Course  I have reviewed the triage vital signs and the nursing notes.  Pertinent labs & imaging results that were available during my care of the patient were reviewed by me and considered in my medical decision making (see chart for details).     84  year old male with intermittent dizziness with activity.  Consistently bradycardic.  Sinus bradycardia with occasional ectopy.  He is on a low-dose of carvedilol.  Will obtain labs/electrolytes.  Cardiology consultation.  No symptoms while at rest.  Blood pressure is fine.  Discussed with Dr [place]. Recommends holding coreg. He has scheduled cardiology appointment this week. BP remains good. Electrolytes ok. He has a history of vertigo but says recent symptoms different and not associated with spinning sensation.   Final Clinical Impressions(s) / ED Diagnoses   Final diagnoses:  Dizziness  Bradycardia    ED Discharge Orders    None       Virgel Manifold, MD 11/19/18 [id number]

## 2018-11-17 NOTE — Discharge Instructions (Signed)
I discussed with cardiology. They want you to hold your coreg (carvedilol). This medication is a beta blocker and will lower your heart rate. Your heart rhythm is sinus (normal) but slow. They would prefer to see if your symptoms/heart rate improve off of the medication before further consideration of pacemaker placement.

## 2018-11-18 ENCOUNTER — Telehealth: Payer: Self-pay | Admitting: Internal Medicine

## 2018-11-18 NOTE — Telephone Encounter (Signed)
New Message:   Patient wife calling stating that her husband was in the ER and they need a sooner appt. ER ask them to call to get a sooner appt also has some questions concering some medication changes.

## 2018-11-18 NOTE — Telephone Encounter (Signed)
Returned call to wife.  Appt made with Dr. Lovena Le for 9/16 at 8:45 am s/p ER for bradycardia.

## 2018-11-20 ENCOUNTER — Ambulatory Visit (INDEPENDENT_AMBULATORY_CARE_PROVIDER_SITE_OTHER): Payer: Medicare Other | Admitting: Internal Medicine

## 2018-11-20 ENCOUNTER — Other Ambulatory Visit: Payer: Self-pay

## 2018-11-20 ENCOUNTER — Encounter: Payer: Self-pay | Admitting: Internal Medicine

## 2018-11-20 ENCOUNTER — Ambulatory Visit: Payer: Medicare Other | Admitting: Internal Medicine

## 2018-11-20 ENCOUNTER — Ambulatory Visit (INDEPENDENT_AMBULATORY_CARE_PROVIDER_SITE_OTHER): Payer: Medicare Other

## 2018-11-20 VITALS — BP 174/86 | HR 54 | Ht 75.0 in | Wt 196.0 lb

## 2018-11-20 DIAGNOSIS — I483 Typical atrial flutter: Secondary | ICD-10-CM

## 2018-11-20 DIAGNOSIS — I4891 Unspecified atrial fibrillation: Secondary | ICD-10-CM | POA: Diagnosis not present

## 2018-11-20 DIAGNOSIS — I824Y9 Acute embolism and thrombosis of unspecified deep veins of unspecified proximal lower extremity: Secondary | ICD-10-CM

## 2018-11-20 DIAGNOSIS — R001 Bradycardia, unspecified: Secondary | ICD-10-CM

## 2018-11-20 DIAGNOSIS — Z953 Presence of xenogenic heart valve: Secondary | ICD-10-CM

## 2018-11-20 DIAGNOSIS — Z5181 Encounter for therapeutic drug level monitoring: Secondary | ICD-10-CM

## 2018-11-20 DIAGNOSIS — D6859 Other primary thrombophilia: Secondary | ICD-10-CM

## 2018-11-20 LAB — POCT INR: INR: 3.3 — AB (ref 2.0–3.0)

## 2018-11-20 MED ORDER — AMLODIPINE BESYLATE 5 MG PO TABS: 5.0000 mg | ORAL_TABLET | Freq: Every day | ORAL | 3 refills | Status: DC

## 2018-11-20 NOTE — Progress Notes (Signed)
HPI [name] returns today for followup. He is a pleasant 84 yo man with atrial flutter and heart block who underwent EPS/RFA of his atrial flutter a couple of weeks ago. He had persistent AV conduction in the setting of sinus node dysfunction after the ablation but I did not think that his HR was slow enough to warrant a PPM. He presented to the ED last week with dizzy spells and HR's in the low 40's. He had his coreg 3.125 stopped. He feels better with no recurrent spells of symptomatic bradycardia. He has not had syncope. Allergies  Allergen Reactions  . Sudafed [Pseudoephedrine Hcl] Palpitations  . Cardizem [Diltiazem Hcl] Rash     Current Outpatient Medications  Medication Sig Dispense Refill  . acetaminophen (TYLENOL) 500 MG tablet Take 500 mg by mouth every 6 (six) hours as needed for mild pain.     Marland Kitchen aspirin EC 81 MG EC tablet Take 1 tablet (81 mg total) by mouth daily.    . meclizine (ANTIVERT) 25 MG tablet Take 25 mg by mouth 3 (three) times daily as needed for dizziness.    . Multiple Vitamins-Minerals (STRESS B COMPLEX/ZINC PO) Take 1 tablet by mouth 2 (two) times a week.     Marland Kitchen omeprazole (PRILOSEC) 20 MG capsule Take 20 mg by mouth daily as needed (for acid reduction).     Vladimir Faster Glycol-Propyl Glycol (SYSTANE OP) Place 1 drop into both eyes daily as needed (for dry eyes).     . polyethylene glycol (MIRALAX / GLYCOLAX) 17 g packet Take 17 g by mouth daily as needed (constipation.).    Marland Kitchen rosuvastatin (CRESTOR) 10 MG tablet Take 10 mg by mouth every evening.     . tamsulosin (FLOMAX) 0.4 MG CAPS capsule Take 0.4 mg by mouth every evening.     . warfarin (COUMADIN) 5 MG tablet TAKE AS DIRECTED BY COUMADIN CLINIC (Patient taking differently: Take 2.5-5 mg by mouth See admin instructions. Take 1 tablet (5 mg) by mouth on Monday & Friday nights, then take 1/2  tablet (2.5 mg) by mouth all other nights of the week.) 70 tablet 0  . amLODipine (NORVASC) 5 MG tablet Take 1  tablet (5 mg total) by mouth daily. 90 tablet 3   No current facility-administered medications for this visit.      Past Medical History:  Diagnosis Date  . AORTIC STENOSIS    a. Echo (05/26/13):  Mod LVH, vigorous LVF, EF 65-70%, no RWMA, Gr 1 DD, severe AS (mean 40 mmHg) => s/p bioprosthetic AVR 07/2013  . Arthritis    BACK  . CAD (coronary artery disease)    a. Lexiscan myoview (5/13) with EF 70%, no ischemia or infarction;   b. LHC (06/2013):  Dist LM 30%, ostial LAD 40-50%, prox LAD 30%, ostial D1 80-90%, prox CFX 40%, RCA occluded with L-R collats => CABG (S-PDA at time of AVR)  . CVA (cerebral infarction)    a. 3/14 right parietal infarct. Carotid US 3/14 showed no significant disease. He has an implanted loop recorder to look for atrial fibrillation.   . DYSPEPSIA   . ED (erectile dysfunction)   . Former smoker   . GERD (gastroesophageal reflux disease)   . History of kidney stones   . HYPERLIPIDEMIA   . HYPERTENSION   . Palpitations    a. Event monitor (5/13) with occasional PVCs, PACs but no significant arrhythmia.   . S/P aortic valve replacement with bioprosthetic valve +  CABG x1 07/11/2013   23 mm [facility] Ease bovine pericardial tissue valve;  Echo (08/2013):  EF 55-60%, no RWMA, AVR ok, Asc Ao 40 mm (mildly dilated), mild LAE, mild RVE, lipomatous hypertrophy, mod TR, PASP 36 mmHg  . S/P CABG x 1 07/11/2013   SVG to PDA with EVH via right thigh  . Sinus bradycardia    a. 2013 - HR 40's in the office. BB stopped.  . Trigeminal neuropathy   . Vertigo     ROS:   All systems reviewed and negative except as noted in the HPI.   Past Surgical History:  Procedure Laterality Date  . A-FLUTTER ABLATION N/A 11/13/2018   Procedure: A-FLUTTER ABLATION;  Surgeon: Evans Lance, MD;  Location: [place] CV LAB;  Service: Cardiovascular;  Laterality: N/A;  . AORTIC VALVE REPLACEMENT N/A 07/11/2013   Procedure: AORTIC VALVE REPLACEMENT (AVR);  Surgeon: [name], MD;   Location: [place];  Service: Open Heart Surgery;  Laterality: N/A;  . CATARACT EXTRACTION    . CORONARY ARTERY BYPASS GRAFT N/A 07/11/2013   Procedure: CORONARY ARTERY BYPASS GRAFTING (CABG) x1;  Surgeon: [name], MD;  Location: [place];  Service: Open Heart Surgery;  Laterality: N/A;  . INGUINAL HERNIA REPAIR Right 01/15/2013   Procedure: HERNIA REPAIR INGUINAL ADULT;  Surgeon: Harl Bowie, MD;  Location: [place];  Service: General;  Laterality: Right;  . INSERTION OF MESH Right 01/15/2013   Procedure: INSERTION OF MESH;  Surgeon: Harl Bowie, MD;  Location: [place];  Service: General;  Laterality: Right;  . INTRAOPERATIVE TRANSESOPHAGEAL ECHOCARDIOGRAM N/A 07/11/2013   Procedure: INTRAOPERATIVE TRANSESOPHAGEAL ECHOCARDIOGRAM;  Surgeon: [name], MD;  Location: [place];  Service: Open Heart Surgery;  Laterality: N/A;  . LEFT AND RIGHT HEART CATHETERIZATION WITH CORONARY ANGIOGRAM N/A 06/06/2013   Procedure: LEFT AND RIGHT HEART CATHETERIZATION WITH CORONARY ANGIOGRAM;  Surgeon: Larey Dresser, MD;  Location: [facility] CATH LAB;  Service: Cardiovascular;  Laterality: N/A;  . LITHOTRIPSY    . LOOP RECORDER IMPLANT  04/15/2013   MDT LinQ implanred by Dr Lovena Le for cryptogenic stroke  . LOOP RECORDER IMPLANT N/A 04/14/2013   Procedure: LOOP RECORDER IMPLANT;  Surgeon: Evans Lance, MD;  Location: [facility] CATH LAB;  Service: Cardiovascular;  Laterality: N/A;  . LOOP RECORDER REMOVAL N/A 11/13/2018   Procedure: LOOP RECORDER REMOVAL;  Surgeon: Evans Lance, MD;  Location: [place] CV LAB;  Service: Cardiovascular;  Laterality: N/A;  . MOUTH SURGERY       Family History  Problem Relation Age of Onset  . Diabetes Mother   . Heart attack Mother   . Heart attack Father   . Hypertension Father   . Diabetes Unknown   . Hypertension Unknown   . Lung cancer Unknown   . Cancer Sister        breast  . Stroke Neg Hx      Social History    Socioeconomic History  . Marital status: Married    Spouse name: Not on file  . Number of children: Not on file  . Years of education: Not on file  . Highest education level: Not on file  Occupational History  . Not on file  Social Needs  . Financial resource strain: Not on file  . Food insecurity    Worry: Not on file    Inability: Not on file  . Transportation needs    Medical: Not on  file    Non-medical: Not on file  Tobacco Use  . Smoking status: Former Smoker    Packs/day: 1.00    Years: 30.00    Pack years: 30.00    Types: Cigarettes    Quit date: 08/29/2009    Years since quitting: 9.2  . Smokeless tobacco: Never Used  Substance and Sexual Activity  . Alcohol use: No  . Drug use: No  . Sexual activity: Not Currently  Lifestyle  . Physical activity    Days per week: Not on file    Minutes per session: Not on file  . Stress: Not on file  Relationships  . Social Herbalist on phone: Not on file    Gets together: Not on file    Attends religious service: Not on file    Active member of club or organization: Not on file    Attends meetings of clubs or organizations: Not on file    Relationship status: Not on file  . Intimate partner violence    Fear of current or ex partner: Not on file    Emotionally abused: Not on file    Physically abused: Not on file    Forced sexual activity: Not on file  Other Topics Concern  . Not on file  Social History Narrative   He resides in [place] with his wife and son. 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  No    grandchildren. He smokes half a pack per day intermittently for the last 40    years. He drinks a glass of homemade wine mixed with diet Coke one time per    week. He denies any drugs, herbal medication, diet, or exercise program.           BP (!) 174/86   Pulse (!) 54   Ht 6\' 3"  (1.905 m)   Wt 196 lb (88.9 kg)   SpO2 97%   BMI 24.50 kg/m   Physical Exam:   Well appearing NAD HEENT: Unremarkable Neck:  No JVD, no thyromegally Lymphatics:  No adenopathy Back:  No CVA tenderness Lungs:  Clear HEART:  Regular rate rhythm, no murmurs, no rubs, no clicks Abd:  soft, positive bowel sounds, no organomegally, no rebound, no guarding Ext:  2 plus pulses, no edema, no cyanosis, no clubbing Skin:  No rashes no nodules Neuro:  CN II through XII intact, motor grossly intact  EKG - sinus bradycardia  Assess/Plan: 1. Sinus node dysfunction - he is improved after stopping very low dose coreg. I have recommended watchful waiting. His HR is in the mid 50's.  2. Atrial flutter - he has had no recurrent spells s/p catheter ablation. He will continue his current meds. 3. HTN - his bp is elevated today. He notes that it has been much better at home.   Mikle Bosworth.D.

## 2018-11-20 NOTE — Patient Instructions (Addendum)
Medication Instructions:  Your physician has recommended you make the following change in your medication:   1.  Start taking amlodipine 5 mg--Take one tablet by mouth daily.  Labwork: None ordered.  Testing/Procedures: None ordered.  Follow-Up: Your physician wants you to follow-up in: December 2020 with Dr. Lovena Le.      Any Other Special Instructions Will Be Listed Below (If Applicable).  If you need a refill on your cardiac medications before your next appointment, please call your pharmacy.

## 2018-11-20 NOTE — Patient Instructions (Signed)
Description   Skip today's dosage of Coumadin, then resume same dosage of Coumadin 1/2 tablet daily except 1 tablet on Mondays and Fridays. Recheck INR in 3 weeks. Please call [phone] with any new medications or if scheduled for any procedures.

## 2018-11-20 NOTE — H&P (View-Only) (Signed)
HPI Sean Brock returns today for followup. He is a pleasant 84 yo man with atrial flutter and heart block who underwent EPS/RFA of his atrial flutter a couple of weeks ago. He had persistent AV conduction in the setting of sinus node dysfunction after the ablation but I did not think that his HR was slow enough to warrant a PPM. He presented to the ED last week with dizzy spells and HR's in the low 40's. He had his coreg 3.125 stopped. He feels better with no recurrent spells of symptomatic bradycardia. He has not had syncope. Allergies  Allergen Reactions  . Sudafed [Pseudoephedrine Hcl] Palpitations  . Cardizem [Diltiazem Hcl] Rash     Current Outpatient Medications  Medication Sig Dispense Refill  . acetaminophen (TYLENOL) 500 MG tablet Take 500 mg by mouth every 6 (six) hours as needed for mild pain.     Marland Kitchen aspirin EC 81 MG EC tablet Take 1 tablet (81 mg total) by mouth daily.    . meclizine (ANTIVERT) 25 MG tablet Take 25 mg by mouth 3 (three) times daily as needed for dizziness.    . Multiple Vitamins-Minerals (STRESS B COMPLEX/ZINC PO) Take 1 tablet by mouth 2 (two) times a week.     Marland Kitchen omeprazole (PRILOSEC) 20 MG capsule Take 20 mg by mouth daily as needed (for acid reduction).     Sean Brock Glycol-Propyl Glycol (SYSTANE OP) Place 1 drop into both eyes daily as needed (for dry eyes).     . polyethylene glycol (MIRALAX / GLYCOLAX) 17 g packet Take 17 g by mouth daily as needed (constipation.).    Marland Kitchen rosuvastatin (CRESTOR) 10 MG tablet Take 10 mg by mouth every evening.     . tamsulosin (FLOMAX) 0.4 MG CAPS capsule Take 0.4 mg by mouth every evening.     . warfarin (COUMADIN) 5 MG tablet TAKE AS DIRECTED BY COUMADIN CLINIC (Patient taking differently: Take 2.5-5 mg by mouth See admin instructions. Take 1 tablet (5 mg) by mouth on Monday & Friday nights, then take 1/2  tablet (2.5 mg) by mouth all other nights of the week.) 70 tablet 0  . amLODipine (NORVASC) 5 MG tablet Take 1  tablet (5 mg total) by mouth daily. 90 tablet 3   No current facility-administered medications for this visit.      Past Medical History:  Diagnosis Date  . AORTIC STENOSIS    a. Echo (05/26/13):  Mod LVH, vigorous LVF, EF 65-70%, no RWMA, Gr 1 DD, severe AS (mean 40 mmHg) => s/p bioprosthetic AVR 07/2013  . Arthritis    BACK  . CAD (coronary artery disease)    a. Lexiscan myoview (5/13) with EF 70%, no ischemia or infarction;   b. LHC (06/2013):  Dist LM 30%, ostial LAD 40-50%, prox LAD 30%, ostial D1 80-90%, prox CFX 40%, RCA occluded with L-R collats => CABG (S-PDA at time of AVR)  . CVA (cerebral infarction)    a. 3/14 right parietal infarct. Carotid US 3/14 showed no significant disease. He has an implanted loop recorder to look for atrial fibrillation.   . DYSPEPSIA   . ED (erectile dysfunction)   . Former smoker   . GERD (gastroesophageal reflux disease)   . History of kidney stones   . HYPERLIPIDEMIA   . HYPERTENSION   . Palpitations    a. Event monitor (5/13) with occasional PVCs, PACs but no significant arrhythmia.   . S/P aortic valve replacement with bioprosthetic valve +  CABG x1 07/11/2013   23 mm [facility] Ease bovine pericardial tissue valve;  Echo (08/2013):  EF 55-60%, no RWMA, AVR ok, Asc Ao 40 mm (mildly dilated), mild LAE, mild RVE, lipomatous hypertrophy, mod TR, PASP 36 mmHg  . S/P CABG x 1 07/11/2013   SVG to PDA with EVH via right thigh  . Sinus bradycardia    a. 2013 - HR 40's in the office. BB stopped.  . Trigeminal neuropathy   . Vertigo     ROS:   All systems reviewed and negative except as noted in the HPI.   Past Surgical History:  Procedure Laterality Date  . A-FLUTTER ABLATION N/A 11/13/2018   Procedure: A-FLUTTER ABLATION;  Surgeon: Evans Lance, MD;  Location: [place] [facility];  Service: Cardiovascular;  Laterality: N/A;  . AORTIC VALVE REPLACEMENT N/A 07/11/2013   Procedure: AORTIC VALVE REPLACEMENT (AVR);  Surgeon: [name], MD;   Location: [place];  Service: Open Heart Surgery;  Laterality: N/A;  . CATARACT EXTRACTION    . CORONARY ARTERY BYPASS GRAFT N/A 07/11/2013   Procedure: CORONARY ARTERY BYPASS GRAFTING (CABG) x1;  Surgeon: [name], MD;  Location: [place];  Service: Open Heart Surgery;  Laterality: N/A;  . INGUINAL HERNIA REPAIR Right 01/15/2013   Procedure: HERNIA REPAIR INGUINAL ADULT;  Surgeon: Harl Bowie, MD;  Location: [place];  Service: General;  Laterality: Right;  . INSERTION OF MESH Right 01/15/2013   Procedure: INSERTION OF MESH;  Surgeon: Harl Bowie, MD;  Location: [place];  Service: General;  Laterality: Right;  . INTRAOPERATIVE TRANSESOPHAGEAL ECHOCARDIOGRAM N/A 07/11/2013   Procedure: INTRAOPERATIVE TRANSESOPHAGEAL ECHOCARDIOGRAM;  Surgeon: [name], MD;  Location: [place];  Service: Open Heart Surgery;  Laterality: N/A;  . LEFT AND RIGHT HEART CATHETERIZATION WITH CORONARY ANGIOGRAM N/A 06/06/2013   Procedure: LEFT AND RIGHT HEART CATHETERIZATION WITH CORONARY ANGIOGRAM;  Surgeon: Larey Dresser, MD;  Location: [facility];  Service: Cardiovascular;  Laterality: N/A;  . LITHOTRIPSY    . LOOP RECORDER IMPLANT  04/15/2013   MDT LinQ implanred by Dr Lovena Le for cryptogenic stroke  . LOOP RECORDER IMPLANT N/A 04/14/2013   Procedure: LOOP RECORDER IMPLANT;  Surgeon: Evans Lance, MD;  Location: [facility] CATH LAB;  Service: Cardiovascular;  Laterality: N/A;  . LOOP RECORDER REMOVAL N/A 11/13/2018   Procedure: LOOP RECORDER REMOVAL;  Surgeon: Evans Lance, MD;  Location: [place] [facility];  Service: Cardiovascular;  Laterality: N/A;  . MOUTH SURGERY       Family History  Problem Relation Age of Onset  . Diabetes Mother   . Heart attack Mother   . Heart attack Father   . Hypertension Father   . Diabetes Unknown   . Hypertension Unknown   . Lung cancer Unknown   . Cancer Sister        breast  . Stroke Neg Hx      Social History    Socioeconomic History  . Marital status: Married    Spouse name: Not on file  . Number of children: Not on file  . Years of education: Not on file  . Highest education level: Not on file  Occupational History  . Not on file  Social Needs  . Financial resource strain: Not on file  . Food insecurity    Worry: Not on file    Inability: Not on file  . Transportation needs    Medical: Not on  file    Non-medical: Not on file  Tobacco Use  . Smoking status: Former Smoker    Packs/day: 1.00    Years: 30.00    Pack years: 30.00    Types: Cigarettes    Quit date: 08/29/2009    Years since quitting: 9.2  . Smokeless tobacco: Never Used  Substance and Sexual Activity  . Alcohol use: No  . Drug use: No  . Sexual activity: Not Currently  Lifestyle  . Physical activity    Days per week: Not on file    Minutes per session: Not on file  . Stress: Not on file  Relationships  . Social Herbalist on phone: Not on file    Gets together: Not on file    Attends religious service: Not on file    Active member of club or organization: Not on file    Attends meetings of clubs or organizations: Not on file    Relationship status: Not on file  . Intimate partner violence    Fear of current or ex partner: Not on file    Emotionally abused: Not on file    Physically abused: Not on file    Forced sexual activity: Not on file  Other Topics Concern  . Not on file  Social History Narrative   He resides in [place] with his wife and son. 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  No    grandchildren. He smokes half a pack per day intermittently for the last 40    years. He drinks a glass of homemade wine mixed with diet Coke one time per    week. He denies any drugs, herbal medication, diet, or exercise program.           BP (!) 174/86   Pulse (!) 54   Ht 6\' 3"  (1.905 m)   Wt 196 lb (88.9 kg)   SpO2 97%   BMI 24.50 kg/m   Physical Exam:   Well appearing NAD HEENT: Unremarkable Neck:  No JVD, no thyromegally Lymphatics:  No adenopathy Back:  No CVA tenderness Lungs:  Clear HEART:  Regular rate rhythm, no murmurs, no rubs, no clicks Abd:  soft, positive bowel sounds, no organomegally, no rebound, no guarding Ext:  2 plus pulses, no edema, no cyanosis, no clubbing Skin:  No rashes no nodules Neuro:  CN II through XII intact, motor grossly intact  EKG - sinus bradycardia  Assess/Plan: 1. Sinus node dysfunction - he is improved after stopping very low dose coreg. I have recommended watchful waiting. His HR is in the mid 50's.  2. Atrial flutter - he has had no recurrent spells s/p catheter ablation. He will continue his current meds. 3. HTN - his bp is elevated today. He notes that it has been much better at home.   Mikle Bosworth.D.

## 2018-11-26 ENCOUNTER — Ambulatory Visit: Payer: Medicare Other

## 2018-12-02 ENCOUNTER — Telehealth: Payer: Self-pay | Admitting: Internal Medicine

## 2018-12-02 NOTE — Telephone Encounter (Signed)
Returned call to Pt.  Per Pt, since stopping the carvedilol he is still having dizziness and is very weak.  He is ready to move forward with pacemaker placement.  Advised Pt would send him some available dates coming up via [place].  He will discuss with his wife and let this nurse know.

## 2018-12-02 NOTE — Telephone Encounter (Signed)
  Wife is calling to see if he can get the pacemaker scheduled if Dr Lovena Le wants to do it.

## 2018-12-03 NOTE — Telephone Encounter (Signed)
Pt scheduled for pacemaker placement on 12/09/2018  All work up complete

## 2018-12-06 ENCOUNTER — Other Ambulatory Visit: Payer: Self-pay | Admitting: Internal Medicine

## 2018-12-06 ENCOUNTER — Other Ambulatory Visit (HOSPITAL_COMMUNITY)
Admission: RE | Admit: 2018-12-06 | Discharge: 2018-12-06 | Disposition: A | Discharge status: Home / Self Care | Payer: Medicare Other | Source: Ambulatory Visit | Attending: Internal Medicine | Admitting: Internal Medicine

## 2018-12-06 DIAGNOSIS — Z20828 Contact with and (suspected) exposure to other viral communicable diseases: Secondary | ICD-10-CM | POA: Insufficient documentation

## 2018-12-06 DIAGNOSIS — Z01812 Encounter for preprocedural laboratory examination: Secondary | ICD-10-CM | POA: Diagnosis present

## 2018-12-08 LAB — NOVEL CORONAVIRUS, NAA (HOSP ORDER, SEND-OUT TO REF LAB; TAT 18-24 HRS): SARS-CoV-2, NAA: NOT DETECTED

## 2018-12-09 ENCOUNTER — Ambulatory Visit (HOSPITAL_COMMUNITY)
Admission: RE | Disposition: A | Discharge status: Home / Self Care | Payer: Self-pay | Source: Home / Self Care | Attending: Internal Medicine

## 2018-12-09 ENCOUNTER — Ambulatory Visit (HOSPITAL_COMMUNITY)
Admission: RE | Admit: 2018-12-09 | Discharge: 2018-12-10 | Disposition: A | Discharge status: Home / Self Care | Payer: Medicare Other | Attending: Internal Medicine | Admitting: Internal Medicine

## 2018-12-09 ENCOUNTER — Other Ambulatory Visit: Payer: Self-pay

## 2018-12-09 DIAGNOSIS — I4892 Unspecified atrial flutter: Secondary | ICD-10-CM | POA: Diagnosis not present

## 2018-12-09 DIAGNOSIS — I10 Essential (primary) hypertension: Secondary | ICD-10-CM | POA: Insufficient documentation

## 2018-12-09 DIAGNOSIS — E785 Hyperlipidemia, unspecified: Secondary | ICD-10-CM | POA: Diagnosis not present

## 2018-12-09 DIAGNOSIS — I495 Sick sinus syndrome: Secondary | ICD-10-CM

## 2018-12-09 DIAGNOSIS — M1990 Unspecified osteoarthritis, unspecified site: Secondary | ICD-10-CM | POA: Insufficient documentation

## 2018-12-09 DIAGNOSIS — Z7901 Long term (current) use of anticoagulants: Secondary | ICD-10-CM | POA: Diagnosis not present

## 2018-12-09 DIAGNOSIS — Z950 Presence of cardiac pacemaker: Secondary | ICD-10-CM | POA: Insufficient documentation

## 2018-12-09 DIAGNOSIS — Z87891 Personal history of nicotine dependence: Secondary | ICD-10-CM | POA: Insufficient documentation

## 2018-12-09 DIAGNOSIS — Z7982 Long term (current) use of aspirin: Secondary | ICD-10-CM | POA: Insufficient documentation

## 2018-12-09 DIAGNOSIS — Z79899 Other long term (current) drug therapy: Secondary | ICD-10-CM | POA: Insufficient documentation

## 2018-12-09 DIAGNOSIS — Z953 Presence of xenogenic heart valve: Secondary | ICD-10-CM | POA: Diagnosis not present

## 2018-12-09 DIAGNOSIS — Z951 Presence of aortocoronary bypass graft: Secondary | ICD-10-CM | POA: Insufficient documentation

## 2018-12-09 DIAGNOSIS — I2510 Atherosclerotic heart disease of native coronary artery without angina pectoris: Secondary | ICD-10-CM | POA: Diagnosis not present

## 2018-12-09 DIAGNOSIS — K219 Gastro-esophageal reflux disease without esophagitis: Secondary | ICD-10-CM | POA: Diagnosis not present

## 2018-12-09 HISTORY — PX: PACEMAKER IMPLANT: EP1218

## 2018-12-09 LAB — PROTIME-INR
INR: 2.3 — ABNORMAL HIGH (ref 0.8–1.2)
Prothrombin Time: 25.1 seconds — ABNORMAL HIGH (ref 11.4–15.2)

## 2018-12-09 LAB — SURGICAL PCR SCREEN
MRSA, PCR: NEGATIVE
Staphylococcus aureus: NEGATIVE

## 2018-12-09 SURGERY — PACEMAKER IMPLANT

## 2018-12-09 MED ORDER — CEFAZOLIN SODIUM-DEXTROSE 1-4 GM/50ML-% IV SOLN
1.0000 g | Freq: Four times a day (QID) | INTRAVENOUS | Status: AC
  Administered 2018-12-09 – 2018-12-10 (×3): 1 g via INTRAVENOUS
  Filled 2018-12-09 (×4): qty 50

## 2018-12-09 MED ORDER — IOHEXOL 350 MG/ML SOLN
INTRAVENOUS | Status: DC | PRN
  Administered 2018-12-09: 10 mL

## 2018-12-09 MED ORDER — MIDAZOLAM HCL 5 MG/5ML IJ SOLN
INTRAMUSCULAR | Status: DC | PRN
  Administered 2018-12-09 (×3): 1 mg via INTRAVENOUS

## 2018-12-09 MED ORDER — CHLORZOXAZONE 500 MG PO TABS
500.0000 mg | ORAL_TABLET | Freq: Four times a day (QID) | ORAL | Status: DC | PRN
  Filled 2018-12-09: qty 1

## 2018-12-09 MED ORDER — TAMSULOSIN HCL 0.4 MG PO CAPS
0.4000 mg | ORAL_CAPSULE | Freq: Every evening | ORAL | Status: DC
  Administered 2018-12-09: 0.4 mg via ORAL
  Filled 2018-12-09: qty 1

## 2018-12-09 MED ORDER — AMLODIPINE BESYLATE 5 MG PO TABS
5.0000 mg | ORAL_TABLET | Freq: Every day | ORAL | Status: DC
  Administered 2018-12-10: 5 mg via ORAL
  Filled 2018-12-09: qty 1

## 2018-12-09 MED ORDER — SODIUM CHLORIDE 0.9 % IV SOLN
INTRAVENOUS | Status: DC
  Administered 2018-12-09: 10:00:00 via INTRAVENOUS

## 2018-12-09 MED ORDER — CHLORHEXIDINE GLUCONATE 4 % EX LIQD: 60.0000 mL | Freq: Once | CUTANEOUS | Status: DC

## 2018-12-09 MED ORDER — PANTOPRAZOLE SODIUM 40 MG PO TBEC
40.0000 mg | DELAYED_RELEASE_TABLET | Freq: Every day | ORAL | Status: DC
  Administered 2018-12-09 – 2018-12-10 (×2): 40 mg via ORAL
  Filled 2018-12-09 (×2): qty 1

## 2018-12-09 MED ORDER — FENTANYL CITRATE (PF) 100 MCG/2ML IJ SOLN
INTRAMUSCULAR | Status: AC
  Filled 2018-12-09: qty 2

## 2018-12-09 MED ORDER — SODIUM CHLORIDE 0.9 % IV SOLN
INTRAVENOUS | Status: AC
  Filled 2018-12-09: qty 2

## 2018-12-09 MED ORDER — ACETAMINOPHEN 500 MG PO TABS: 500.0000 mg | ORAL_TABLET | Freq: Four times a day (QID) | ORAL | Status: DC | PRN

## 2018-12-09 MED ORDER — CEFAZOLIN SODIUM-DEXTROSE 2-4 GM/100ML-% IV SOLN
INTRAVENOUS | Status: AC
  Filled 2018-12-09: qty 100

## 2018-12-09 MED ORDER — LIDOCAINE HCL (PF) 1 % IJ SOLN
INTRAMUSCULAR | Status: DC | PRN
  Administered 2018-12-09: 60 mL

## 2018-12-09 MED ORDER — MUPIROCIN 2 % EX OINT
TOPICAL_OINTMENT | CUTANEOUS | Status: AC
  Administered 2018-12-09: 10:00:00
  Filled 2018-12-09: qty 22

## 2018-12-09 MED ORDER — LIDOCAINE HCL 1 % IJ SOLN
INTRAMUSCULAR | Status: AC
  Filled 2018-12-09: qty 60

## 2018-12-09 MED ORDER — ROSUVASTATIN CALCIUM 5 MG PO TABS
10.0000 mg | ORAL_TABLET | Freq: Every evening | ORAL | Status: DC
  Administered 2018-12-09: 10 mg via ORAL
  Filled 2018-12-09: qty 2

## 2018-12-09 MED ORDER — MECLIZINE HCL 25 MG PO TABS: 25.0000 mg | ORAL_TABLET | Freq: Three times a day (TID) | ORAL | Status: DC | PRN

## 2018-12-09 MED ORDER — HEPARIN (PORCINE) IN NACL 1000-0.9 UT/500ML-% IV SOLN
INTRAVENOUS | Status: DC | PRN
  Administered 2018-12-09: 500 mL

## 2018-12-09 MED ORDER — ASPIRIN EC 81 MG PO TBEC
81.0000 mg | DELAYED_RELEASE_TABLET | Freq: Every day | ORAL | Status: DC
  Administered 2018-12-10: 09:00:00 81 mg via ORAL
  Filled 2018-12-09: qty 1

## 2018-12-09 MED ORDER — CEFAZOLIN SODIUM-DEXTROSE 2-4 GM/100ML-% IV SOLN
2.0000 g | INTRAVENOUS | Status: AC
  Administered 2018-12-09: 2 g via INTRAVENOUS

## 2018-12-09 MED ORDER — FENTANYL CITRATE (PF) 100 MCG/2ML IJ SOLN
INTRAMUSCULAR | Status: DC | PRN
  Administered 2018-12-09 (×3): 12.5 ug via INTRAVENOUS

## 2018-12-09 MED ORDER — POLYETHYLENE GLYCOL 3350 17 G PO PACK: 17.0000 g | PACK | Freq: Every day | ORAL | Status: DC | PRN

## 2018-12-09 MED ORDER — SODIUM CHLORIDE 0.9 % IV SOLN
80.0000 mg | INTRAVENOUS | Status: AC
  Administered 2018-12-09: 14:00:00 80 mg

## 2018-12-09 MED ORDER — ACETAMINOPHEN 325 MG PO TABS
325.0000 mg | ORAL_TABLET | ORAL | Status: DC | PRN
  Administered 2018-12-09 – 2018-12-10 (×2): 650 mg via ORAL
  Filled 2018-12-09 (×2): qty 2

## 2018-12-09 MED ORDER — ONDANSETRON HCL 4 MG/2ML IJ SOLN: 4.0000 mg | Freq: Four times a day (QID) | INTRAMUSCULAR | Status: DC | PRN

## 2018-12-09 MED ORDER — MIDAZOLAM HCL 5 MG/5ML IJ SOLN
INTRAMUSCULAR | Status: AC
  Filled 2018-12-09: qty 5

## 2018-12-09 SURGICAL SUPPLY — 12 items
CABLE SURGICAL S-101-97-12 (CABLE) ×2 IMPLANT
CATH SELECT PACE 669183 (CATHETERS) ×1 IMPLANT
CUTTER LV DELIVERY CATHETER 7 (MISCELLANEOUS) ×1 IMPLANT
INGEVITY MRI 7742-59CM (Lead) ×2 IMPLANT
LEAD INGEVITY 7841 52 (Lead) ×1 IMPLANT
LEAD PACING INGEVITY MRI 59CM (Lead) IMPLANT
PACEMAKER ACCOLADE DR-EL (Pacemaker) ×1 IMPLANT
PAD PRO RADIOLUCENT 2001M-C (PAD) ×2 IMPLANT
SHEATH 7FR PRELUDE SNAP 13 (SHEATH) ×2 IMPLANT
SHEATH 8FR PRELUDE SNAP 13 (SHEATH) ×1 IMPLANT
TRAY PACEMAKER INSERTION (PACKS) ×2 IMPLANT
WIRE HI TORQ VERSACORE-J 145CM (WIRE) ×1 IMPLANT

## 2018-12-09 NOTE — Discharge Instructions (Signed)
After Your Pacemaker   You have a Chiropractor   Do not lift your arm above shoulder height for 1 week after your procedure. After 7 days, you may progress as below.     Monday December 16, 2018  Tuesday December 17, 2018 Wednesday December 18, 2018 Thursday December 19, 2018    Do not lift, push, pull, or carry anything over 10 pounds with the affected arm until 6 weeks (Monday January 20, 2019) after your procedure.    Do not drive until your wound check, or until instructed by your healthcare provider that you are safe to do so.    Monitor your pacemaker site for redness, swelling, and drainage. Call the device clinic at [phone] if you experience these symptoms or fever/chills.   If your incision is sealed with Steri-strips or staples. You may shower 7 days after your procedure and wash your incision with soap and water as long as it is healed. If your incision is closed with Dermabond/Surgical glue. You may shower 1 day after your pacemaker implant and wash your incision with soap and water. Avoid lotions, ointments, or perfumes over your incision until it is well-healed.   You may use a hot tub or a pool AFTER your wound check appointment if the incision is completely closed.   Your Pacemaker may be MRI compatible. We will discuss this at your first follow up/wound check. .    Remote monitoring is used to monitor your pacemaker from home. This monitoring is scheduled every 91 days by our office. It allows [place] to keep an eye on the functioning of your device to ensure it is working properly. You will routinely see your Electrophysiologist annually (more often if necessary).    Pacemaker Implantation, Care After This sheet gives you information about how to care for yourself after your procedure. Your health care provider may also give you more specific instructions. If you have problems or questions, contact your health care provider. What can I expect after the  procedure? After the procedure, it is common to have:  Mild pain.  Slight bruising.  Some swelling over the incision.  A slight bump over the skin where the device was placed. Sometimes, it is possible to feel the device under the skin. This is normal.  You should received your Pacemaker ID card within 4-8 weeks. Follow these instructions at home: Medicines  Take over-the-counter and prescription medicines only as told by your health care provider.  If you were prescribed an antibiotic medicine, take it as told by your health care provider. Do not stop taking the antibiotic even if you start to feel better. Wound care     Do not remove the bandage on your chest until directed to do so by your health care provider.  After your bandage is removed, you may see pieces of tape called skin adhesive strips over the area where the cut was made (incision site). Let them fall off on their own.  Check the incision site every day to make sure it is not infected, bleeding, or starting to pull apart.  Do not use lotions or ointments near the incision site unless directed to do so.  Keep the incision area clean and dry for 7 days after the procedure or as directed by your health care provider. It takes several weeks for the incision site to completely heal.  Do not take baths, swim, or use a hot tub for 7-10 days or as otherwise directed by  your health care provider. Activity  Do not drive or use heavy machinery while taking prescription pain medicine.  Do not drive for 24 hours if you were given a medicine to help you relax (sedative).  Check with your health care provider before you start to drive or play sports.  Avoid sudden jerking, pulling, or chopping movements that pull your upper arm far away from your body. Avoid these movements for at least 6 weeks or as long as told by your health care provider.  Do not lift your upper arm above your shoulders for at least 6 weeks or as long  as told by your health care provider. This means no tennis, golf, or swimming.  You may go back to work when your health care provider says it is okay. Pacemaker care  You may be shown how to transfer data from your pacemaker through the phone to your health care provider.  Always let all health care providers know about your pacemaker before you have any medical procedures or tests.  Wear a medical ID bracelet or necklace stating that you have a pacemaker. Carry a pacemaker ID card with you at all times.  Your pacemaker battery will last for 5-15 years. Routine checks by your health care provider will let the health care provider know when the battery is starting to run down. The pacemaker will need to be replaced when the battery starts to run down.  Do not use amateur Chief of Staff. Other electrical devices are safe to use, including power tools, lawn mowers, and speakers. If you are unsure of whether something is safe to use, ask your health care provider.  When using your cell phone, hold it to the ear opposite the pacemaker. Do not leave your cell phone in a pocket over the pacemaker.  Avoid places or objects that have a strong electric or magnetic field, including: ? Airport Herbalist. When at the airport, let officials know that you have a pacemaker. ? Power plants. ? Large electrical generators. ? Radiofrequency transmission towers, such as cell phone and radio towers. General instructions  Weigh yourself every day. If you suddenly gain weight, fluid may be building up in your body.  Keep all follow-up visits as told by your health care provider. This is important. Contact a health care provider if:  You gain weight suddenly.  Your legs or feet swell.  It feels like your heart is fluttering or skipping beats (heart palpitations).  You have chills or a fever.  You have more redness, swelling, or pain around your incisions.  You have  more fluid or blood coming from your incisions.  Your incisions feel warm to the touch.  You have pus or a bad smell coming from your incisions. Get help right away if:  You have chest pain.  You have trouble breathing or are short of breath.  You become extremely tired.  You are light-headed or you faint. This information is not intended to replace advice given to you by your health care provider. Make sure you discuss any questions you have with your health care provider.

## 2018-12-09 NOTE — Interval H&P Note (Signed)
History and Physical Interval Note:  12/09/2018 12:32 PM  Sean Brock  has presented today for surgery, with the diagnosis of bradicardia.  The various methods of treatment have been discussed with the patient and family. After consideration of risks, benefits and other options for treatment, the patient has consented to  Procedure(s): PACEMAKER IMPLANT (N/A) as a surgical intervention.  The patient's history has been reviewed, patient examined, no change in status, stable for surgery.  I have reviewed the patient's chart and labs.  Questions were answered to the patient's satisfaction.     Cristopher Peru

## 2018-12-09 NOTE — Plan of Care (Signed)
  Problem: Education: Goal: Knowledge of cardiac device and self-care will improve Outcome: Progressing   Problem: Cardiac: Goal: Ability to achieve and maintain adequate cardiopulmonary perfusion will improve Outcome: Progressing   Problem: Activity: Goal: Risk for activity intolerance will decrease Outcome: Progressing   Problem: Pain Managment: Goal: General experience of comfort will improve Outcome: Progressing   Problem: Safety: Goal: Ability to remain free from injury will improve Outcome: Progressing

## 2018-12-09 NOTE — Progress Notes (Signed)
Pt's pacemaker site is the upper left chest. Occlusive dressing is clean, dry and intact.  Left arm is secured with sling.

## 2018-12-09 NOTE — Progress Notes (Signed)
Pt on the unit. Pacemaker site in the L upper chest. Clean, dry, and intact. Sling present on L arm

## 2018-12-09 NOTE — Discharge Summary (Addendum)
ELECTROPHYSIOLOGY PROCEDURE DISCHARGE SUMMARY    Patient ID: Sean Brock,  MRN: [id number], DOB/AGE: May 16, 1934 84 y.o.  Admit date: 12/09/2018 Discharge date: 12/10/18   Primary Care Physician: Gaynelle Arabian, MD  Primary Cardiologist: No primary care provider on file.  Electrophysiologist: Dr. Lovena Le  Primary Discharge Diagnosis:  Symptomatic bradycardia due to sinus node dysfunction status post pacemaker implantation this admission  Secondary Discharge Diagnosis:  HTN Atrial flutter s/p ablation  Allergies  Allergen Reactions  . Sudafed [Pseudoephedrine Hcl] Palpitations  . Cardizem [Diltiazem Hcl] Rash     Procedures This Admission:  1.  Implantation of a [employer] dual chamber PPM on 12/09/18 by Dr. Lovena Le.  The patient received a Sports coach number [id number] PPM with model number [id number] right atrial lead and [id number] right ventricular lead. There were no immediate post procedure complications. 2.  CXR on 12/10/18  demonstrated no pneumothorax status post device implantation.   Brief HPI: Sean Brock is a 85 y.o. male was referred to electrophysiology in the outpatient setting for consideration of PPM implantation.  Past medical history includes above.  The patient has had symptomatic bradycardia without reversible causes identified.  Risks, benefits, and alternatives to PPM implantation were reviewed with the patient who wished to proceed.   Hospital Course:  The patient was admitted and underwent implantation of a [employer] dual chamber PPM with details as outlined above.  He was monitored on telemetry overnight which demonstrated appropriate pacing.  Left chest was without hematoma or ecchymosis.  The device was interrogated and found to be functioning normally.  CXR was obtained and demonstrated no pneumothorax status post device implantation.  Wound care, arm mobility, and restrictions were reviewed with the patient.  The patient was examined  and considered stable for discharge to home.    Physical Exam: Vitals:   12/09/18 1900 12/09/18 2005 12/10/18 0120 12/10/18 0519  BP: (!) 129/100 120/73 (!) 148/94 (!) 145/83  Pulse: 65 62 62 63  Resp:  16 20 14   Temp:  97.6 F (36.4 C) 98.2 F (36.8 C) 98.2 F (36.8 C)  TempSrc:  Oral Oral Oral  SpO2:  96% 97% 97%  Weight:    86.7 kg  Height:        GEN- The patient is well appearing, alert and oriented x 3 today.   HEENT: normocephalic, atraumatic; sclera clear, conjunctiva pink; hearing intact; oropharynx clear; neck supple, no JVP Lymph- no cervical lymphadenopathy Lungs- Clear to ausculation bilaterally, normal work of breathing.  No wheezes, rales, rhonchi Heart- Regular rate and rhythm, no murmurs, rubs or gallops, PMI not laterally displaced GI- soft, non-tender, non-distended, bowel sounds present, no hepatosplenomegaly Extremities- no clubbing, cyanosis, or edema; DP/PT/radial pulses 2+ bilaterally MS- no significant deformity or atrophy Skin- warm and dry, no rash or lesion, left chest without hematoma/ecchymosis Psych- euthymic mood, full affect Neuro- strength and sensation are intact   Labs:   Lab Results  Component Value Date   WBC 6.9 11/17/2018   HGB 12.8 (L) 11/17/2018   HCT 38.8 (L) 11/17/2018   MCV 90.7 11/17/2018   PLT 177 11/17/2018   No results for input(s): NA, K, CL, CO2, BUN, CREATININE, CALCIUM, PROT, BILITOT, ALKPHOS, ALT, AST, GLUCOSE in the last 168 hours.  Invalid input(s): LABALBU  Discharge Medications:  Allergies as of 12/10/2018      Reactions   Sudafed [pseudoephedrine Hcl] Palpitations   Cardizem [diltiazem Hcl] Rash      Medication List  TAKE these medications   acetaminophen 500 MG tablet Commonly known as: TYLENOL Take 500 mg by mouth every 6 (six) hours as needed for mild pain.   amLODipine 5 MG tablet Commonly known as: NORVASC Take 1 tablet (5 mg total) by mouth daily.   aspirin 81 MG EC tablet Take 1 tablet  (81 mg total) by mouth daily.   chlorzoxazone 500 MG tablet Commonly known as: PARAFON Take 500 mg by mouth 4 (four) times daily as needed for muscle spasms.   meclizine 25 MG tablet Commonly known as: ANTIVERT Take 25 mg by mouth 3 (three) times daily as needed for dizziness.   omeprazole 20 MG capsule Commonly known as: PRILOSEC Take 20 mg by mouth daily as needed (for acid reduction).   polyethylene glycol 17 g packet Commonly known as: MIRALAX / GLYCOLAX Take 17 g by mouth daily as needed (constipation.).   rosuvastatin 10 MG tablet Commonly known as: CRESTOR Take 10 mg by mouth every evening.   STRESS B COMPLEX/ZINC PO Take 1 tablet by mouth 2 (two) times a week.   SYSTANE OP Place 1 drop into both eyes daily as needed (for dry eyes).   tamsulosin 0.4 MG Caps capsule Commonly known as: FLOMAX Take 0.4 mg by mouth every evening.   warfarin 5 MG tablet Commonly known as: COUMADIN Take as directed. If you are unsure how to take this medication, talk to your nurse or doctor. Original instructions: TAKE AS DIRECTED BY  COUMADIN  CLINIC       Disposition:   Follow-up Information    Evans Lance, MD Follow up on 03/23/2019.   Specialty: Cardiology Why: at 245 pm for 3 month pacemaker check Contact information: [address] [place] [address] [phone]        [facility] Follow up on 12/19/2018.   Why: at 0830 for post pacemaker implant check Contact information: [address] [place] [phone]          Duration of Discharge Encounter: Greater than 30 minutes including physician time.  Jacalyn Lefevre, PA-C  12/10/2018 7:34 AM   EP Attending  Patient seen and examined. Agree with the findings as noted. CXR looks good and the PPM interrogation under my direct supervision demonstrates normal DDD PM function. He will be  discharged home with the usual followup.  Mikle Bosworth.D.

## 2018-12-10 ENCOUNTER — Ambulatory Visit (HOSPITAL_COMMUNITY): Payer: Medicare Other

## 2018-12-10 ENCOUNTER — Encounter (HOSPITAL_COMMUNITY): Payer: Self-pay | Admitting: Internal Medicine

## 2018-12-10 DIAGNOSIS — I495 Sick sinus syndrome: Secondary | ICD-10-CM | POA: Diagnosis not present

## 2018-12-10 DIAGNOSIS — I10 Essential (primary) hypertension: Secondary | ICD-10-CM | POA: Diagnosis not present

## 2018-12-10 DIAGNOSIS — I4892 Unspecified atrial flutter: Secondary | ICD-10-CM | POA: Diagnosis not present

## 2018-12-10 DIAGNOSIS — Z7901 Long term (current) use of anticoagulants: Secondary | ICD-10-CM | POA: Diagnosis not present

## 2018-12-10 MED FILL — Lidocaine HCl Local Inj 1%: INTRAMUSCULAR | Qty: 60 | Status: AC

## 2018-12-11 ENCOUNTER — Ambulatory Visit: Payer: Medicare Other | Admitting: Internal Medicine

## 2018-12-12 ENCOUNTER — Ambulatory Visit: Payer: Medicare Other | Admitting: *Deleted

## 2018-12-12 ENCOUNTER — Telehealth (INDEPENDENT_AMBULATORY_CARE_PROVIDER_SITE_OTHER): Payer: Medicare Other

## 2018-12-12 ENCOUNTER — Other Ambulatory Visit: Payer: Self-pay

## 2018-12-12 DIAGNOSIS — D6859 Other primary thrombophilia: Secondary | ICD-10-CM

## 2018-12-12 DIAGNOSIS — Z5181 Encounter for therapeutic drug level monitoring: Secondary | ICD-10-CM | POA: Diagnosis not present

## 2018-12-12 DIAGNOSIS — I4891 Unspecified atrial fibrillation: Secondary | ICD-10-CM

## 2018-12-12 DIAGNOSIS — Z953 Presence of xenogenic heart valve: Secondary | ICD-10-CM

## 2018-12-12 DIAGNOSIS — I824Y9 Acute embolism and thrombosis of unspecified deep veins of unspecified proximal lower extremity: Secondary | ICD-10-CM

## 2018-12-12 DIAGNOSIS — R079 Chest pain, unspecified: Secondary | ICD-10-CM

## 2018-12-12 LAB — POCT INR: INR: 2.1 (ref 2.0–3.0)

## 2018-12-12 NOTE — Telephone Encounter (Signed)
Patient here for coumadin visit today and complaining of chest pain. Upon arrival to the room patient states that his chest pain has resolved. He states that his chest pain started last night and he felt like his heart was racing. Patient states that the pain was in the center of his chest and worse with activity. Denies SOB, radiation, or any other Sx. Denies caffeine or alcohol use. Has Hx of Aflutter. PPM implant on 10/5. EKG performed (sinus 74 bpm)  BP 130/74. Reviewed with Dr. Lovena Le. Patient will continue to monitor.

## 2018-12-12 NOTE — Patient Instructions (Signed)
Description   Continue taking Coumadin 1/2 tablet daily except 1 tablet on Mondays and Fridays. Recheck INR in 1 week. Please call [phone] with any new medications or if scheduled for any procedures.

## 2018-12-16 ENCOUNTER — Telehealth: Payer: Self-pay

## 2018-12-16 ENCOUNTER — Telehealth: Payer: Self-pay | Admitting: Internal Medicine

## 2018-12-16 NOTE — Telephone Encounter (Signed)
Called patient and he wasaiting to send remote until he had another episode of "cramping" in his chest and dizziness to send remote. 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 Assisted patient in sending remote transmission. No alerts on transmission. Stated he has had BP readings with SBP in 80's today.

## 2018-12-16 NOTE — Telephone Encounter (Signed)
Pt states he felt like a cramp was in his chest twice today. He also states his blood pressure been low and his heart rate been at 105. I advised the pt to send a manual transmission with his home monitor. I told him the nurse will review it and give him a call back. The pt verbalized understanding and thanked me for the call.

## 2018-12-16 NOTE — Telephone Encounter (Signed)
Call returned to Pt.  Per device nurse-Pt has not had any elevated heart rates per his pacemaker.  Per Pt he is having episodes where he feels dizzy and then he has a cramping in his chest.  When this happened he checked his vitals: 131/67 pulse 109 78/66 pulse 87 82/62 pulse 105 112/89 pulse 60--this is when he states the cramping and dizziness stopped   He states this has been coming and going over the last week or so.  Pt has appt for device check on 12/19/2018.  Will discuss with GT when he returns

## 2018-12-16 NOTE — Telephone Encounter (Signed)
STAT if HR is under 50 or over 120 (normal HR is 60-100 beats per minute)  1) What is your heart rate? Having episodes where rate is going up as high 110- not at this exact time- he usually runs in the sixties  2) Do you have a log of your heart rate readings (document readings)? Yes   3) Do you have any other symptoms?  Blood pressure drops when his heart rate is up, cramping sensation in his chest when his rate is high

## 2018-12-17 NOTE — Telephone Encounter (Signed)
Spoke with Dr. Lovena Le.  Per Dr. Lovena Le he believes the chest cramping may be d/t Pt's programmed rate response for his pacemaker.  Pt to see device clinic on Thursday and will be reviewed for reprogramming at that time.

## 2018-12-19 ENCOUNTER — Other Ambulatory Visit: Payer: Self-pay

## 2018-12-19 ENCOUNTER — Ambulatory Visit (INDEPENDENT_AMBULATORY_CARE_PROVIDER_SITE_OTHER): Payer: Medicare Other | Admitting: Student

## 2018-12-19 ENCOUNTER — Ambulatory Visit: Payer: Medicare Other

## 2018-12-19 DIAGNOSIS — Z5181 Encounter for therapeutic drug level monitoring: Secondary | ICD-10-CM | POA: Diagnosis not present

## 2018-12-19 DIAGNOSIS — D6859 Other primary thrombophilia: Secondary | ICD-10-CM

## 2018-12-19 DIAGNOSIS — I824Y9 Acute embolism and thrombosis of unspecified deep veins of unspecified proximal lower extremity: Secondary | ICD-10-CM

## 2018-12-19 DIAGNOSIS — I4891 Unspecified atrial fibrillation: Secondary | ICD-10-CM

## 2018-12-19 DIAGNOSIS — Z953 Presence of xenogenic heart valve: Secondary | ICD-10-CM

## 2018-12-19 LAB — CUP PACEART INCLINIC DEVICE CHECK
Date Time Interrogation Session: 20201015040000
Implantable Lead Implant Date: 20201005
Implantable Lead Implant Date: 20201005
Implantable Lead Location: 753859
Implantable Lead Location: 753860
Implantable Lead Model: 7742
Implantable Lead Model: 7841
Implantable Lead Serial Number: 1017357
Implantable Lead Serial Number: 1068132
Implantable Pulse Generator Implant Date: 20201005
Lead Channel Impedance Value: 619 Ohm
Lead Channel Impedance Value: 727 Ohm
Lead Channel Pacing Threshold Amplitude: 0.9 V
Lead Channel Pacing Threshold Amplitude: 1.1 V
Lead Channel Pacing Threshold Pulse Width: 0.4 ms
Lead Channel Pacing Threshold Pulse Width: 0.4 ms
Lead Channel Sensing Intrinsic Amplitude: 15.6 mV
Lead Channel Sensing Intrinsic Amplitude: 6.8 mV
Lead Channel Setting Pacing Amplitude: 3.5 V
Lead Channel Setting Pacing Amplitude: 3.5 V
Lead Channel Setting Pacing Pulse Width: 0.4 ms
Lead Channel Setting Sensing Sensitivity: 1.5 mV
Pulse Gen Serial Number: 890709

## 2018-12-19 LAB — POCT INR: INR: 2 (ref 2.0–3.0)

## 2018-12-19 MED ORDER — CARVEDILOL 3.125 MG PO TABS: 3.1250 mg | ORAL_TABLET | Freq: Two times a day (BID) | ORAL | 3 refills | Status: DC

## 2018-12-19 NOTE — Patient Instructions (Signed)
Description   Continue taking Coumadin 1/2 tablet daily except 1 tablet on Mondays and Fridays. Recheck INR in 1 week. Please call [phone] with any new medications or if scheduled for any procedures.

## 2018-12-19 NOTE — Progress Notes (Signed)
Wound check appointment. Steri-strips removed. Wound without redness or edema. Incision edges approximated, wound well healed. Normal device function. Thresholds, sensing, and impedances consistent with implant measurements. Device programmed at 3.5V/auto capture programmed on for extra safety margin until 3 month visit. Histogram with occasional atrial tachycardia. No episodes listed currently, but reviewed AT episode on recent transmission. No mode switches or high ventricular rates noted. Pt with symptomatic V pacing at higher rates. Rate response is NOT turned on at this time. Patient educated about wound care, arm mobility, lifting restrictions. ROV in 3 months with Dr. Lovena Le. Will RTC in 2 weeks to re-assess atrial arrhythmias, as changed back to coreg from amlodipine today.

## 2018-12-20 ENCOUNTER — Emergency Department (HOSPITAL_COMMUNITY)
Admission: EM | Admit: 2018-12-20 | Discharge: 2018-12-20 | Disposition: A | Discharge status: Home / Self Care | Payer: Medicare Other | Attending: Emergency Medicine | Admitting: Emergency Medicine

## 2018-12-20 ENCOUNTER — Emergency Department (HOSPITAL_COMMUNITY): Payer: Medicare Other

## 2018-12-20 DIAGNOSIS — Z7982 Long term (current) use of aspirin: Secondary | ICD-10-CM | POA: Insufficient documentation

## 2018-12-20 DIAGNOSIS — Z951 Presence of aortocoronary bypass graft: Secondary | ICD-10-CM | POA: Diagnosis not present

## 2018-12-20 DIAGNOSIS — Z9889 Other specified postprocedural states: Secondary | ICD-10-CM | POA: Insufficient documentation

## 2018-12-20 DIAGNOSIS — R002 Palpitations: Secondary | ICD-10-CM | POA: Insufficient documentation

## 2018-12-20 DIAGNOSIS — I2510 Atherosclerotic heart disease of native coronary artery without angina pectoris: Secondary | ICD-10-CM | POA: Insufficient documentation

## 2018-12-20 DIAGNOSIS — Z952 Presence of prosthetic heart valve: Secondary | ICD-10-CM | POA: Diagnosis not present

## 2018-12-20 DIAGNOSIS — Z79899 Other long term (current) drug therapy: Secondary | ICD-10-CM | POA: Insufficient documentation

## 2018-12-20 DIAGNOSIS — Z7901 Long term (current) use of anticoagulants: Secondary | ICD-10-CM | POA: Insufficient documentation

## 2018-12-20 DIAGNOSIS — R42 Dizziness and giddiness: Secondary | ICD-10-CM | POA: Diagnosis not present

## 2018-12-20 DIAGNOSIS — I10 Essential (primary) hypertension: Secondary | ICD-10-CM | POA: Insufficient documentation

## 2018-12-20 DIAGNOSIS — Z86718 Personal history of other venous thrombosis and embolism: Secondary | ICD-10-CM | POA: Diagnosis not present

## 2018-12-20 DIAGNOSIS — Z87891 Personal history of nicotine dependence: Secondary | ICD-10-CM | POA: Insufficient documentation

## 2018-12-20 DIAGNOSIS — Z950 Presence of cardiac pacemaker: Secondary | ICD-10-CM

## 2018-12-20 LAB — TROPONIN I (HIGH SENSITIVITY)
Troponin I (High Sensitivity): 7 ng/L (ref <18)
Troponin I (High Sensitivity): 7 ng/L (ref <18)

## 2018-12-20 LAB — BASIC METABOLIC PANEL
Anion gap: 8 (ref 5–15)
BUN: 19 mg/dL (ref 8–23)
CO2: 24 mmol/L (ref 22–32)
Calcium: 8.8 mg/dL — ABNORMAL LOW (ref 8.9–10.3)
Chloride: 108 mmol/L (ref 98–111)
Creatinine, Ser: 0.85 mg/dL (ref 0.61–1.24)
GFR calc Af Amer: >60 mL/min (ref >60)
GFR calc non Af Amer: >60 mL/min (ref >60)
Glucose, Bld: 121 mg/dL — ABNORMAL HIGH (ref 70–99)
Potassium: 4.1 mmol/L (ref 3.5–5.1)
Sodium: 140 mmol/L (ref 135–145)

## 2018-12-20 LAB — CBC
HCT: 43 % (ref 39.0–52.0)
Hemoglobin: 14.2 g/dL (ref 13.0–17.0)
MCH: 30.3 pg (ref 26.0–34.0)
MCHC: 33 g/dL (ref 30.0–36.0)
MCV: 91.9 fL (ref 80.0–100.0)
Platelets: 207 10*3/uL (ref 150–400)
RBC: 4.68 MIL/uL (ref 4.22–5.81)
RDW: 13.3 % (ref 11.5–15.5)
WBC: 6.3 10*3/uL (ref 4.0–10.5)
nRBC: 0 % (ref 0.0–0.2)

## 2018-12-20 LAB — PROTIME-INR
INR: 1.8 — ABNORMAL HIGH (ref 0.8–1.2)
Prothrombin Time: 20.5 seconds — ABNORMAL HIGH (ref 11.4–15.2)

## 2018-12-20 MED ORDER — SODIUM CHLORIDE 0.9% FLUSH: 3.0000 mL | Freq: Once | INTRAVENOUS | Status: DC

## 2018-12-20 NOTE — ED Notes (Signed)
Pacemaker interrogated. 

## 2018-12-20 NOTE — ED Notes (Signed)
GOT PATIENT UNDRESS INTO A GOWN ON THE MONITOR GOT PATIENT WARM BLANKET PATIENT IS RESTING WITH FAMILY AT BEDSIDE AND CALL BELL IN REACH

## 2018-12-20 NOTE — Discharge Instructions (Signed)
I recommend calling your primary cardiology doctor to schedule close follow-up next week.  If you have further episodes of chest pain, passing out or other new concerning symptom I recommend returning to ER for reassessment.  Future Appointments  Date Time Provider [place]  12/31/2018 10:00 AM [facility] DEVICE 1 [facility]  01/16/2019  8:30 AM [facility]IN [facility]  03/13/2019  2:45 PM Evans Lance, MD [facility]  06/17/2019  8:30 AM [facility] DEVICE REMOTES [facility]

## 2018-12-20 NOTE — ED Notes (Signed)
Pt to follow up with cardiologist, has an appointment on the 10/27 but will try to get in sooner. NO changes to medications. VSS, NAD.

## 2018-12-20 NOTE — ED Provider Notes (Addendum)
[facility] EMERGENCY DEPARTMENT Provider Note   CSN: [id number] Arrival date & time: 12/20/18  [id number]     History   Chief Complaint Chief Complaint  Patient presents with  . Palpitations    HPI Sean Brock is a 84 y.o. male. CAD, AS s/p valve replacement, CVA, atrial flutter s/p ablation Presents the ER with episode palpitations, lightheadedness.  States around 5 AM he woke up, went to the bathroom and as he laid back down in his recliner to go back to sleep he noted an episode of palpitations and felt lightheaded.  No full syncope.  States episode lasted somewhere around 10 to 15 minutes.  Unsure of exact timing.  States he checked his blood pressure and remembers it was 120s over 70s.  States this episode resolved spontaneously, has not had any recurrence of palpitations or lightheadedness.  Denied room spinning.  No associated numbness, weakness, vision changes.  Did not have any chest pain during this episode.  No shortness of breath.  Per chart review, recent ablation for atrial fibrillation, recent pacemaker placement for symptomatic bradycardia.  Seen in EP clinic yesterday. Switched back to coreg for BP,      HPI  Past Medical History:  Diagnosis Date  . AORTIC STENOSIS    a. Echo (05/26/13):  Mod LVH, vigorous LVF, EF 65-70%, no RWMA, Gr 1 DD, severe AS (mean 40 mmHg) => s/p bioprosthetic AVR 07/2013  . Arthritis    BACK  . CAD (coronary artery disease)    a. Lexiscan myoview (5/13) with EF 70%, no ischemia or infarction;   b. LHC (06/2013):  Dist LM 30%, ostial LAD 40-50%, prox LAD 30%, ostial D1 80-90%, prox CFX 40%, RCA occluded with L-R collats => CABG (S-PDA at time of AVR)  . CVA (cerebral infarction)    a. 3/14 right parietal infarct. Carotid US 3/14 showed no significant disease. He has an implanted loop recorder to look for atrial fibrillation.   . DYSPEPSIA   . ED (erectile dysfunction)   . Former smoker   . GERD (gastroesophageal reflux  disease)   . History of kidney stones   . HYPERLIPIDEMIA   . HYPERTENSION   . Palpitations    a. Event monitor (5/13) with occasional PVCs, PACs but no significant arrhythmia.   . S/P aortic valve replacement with bioprosthetic valve + CABG x1 07/11/2013   23 mm [facility] Ease bovine pericardial tissue valve;  Echo (08/2013):  EF 55-60%, no RWMA, AVR ok, Asc Ao 40 mm (mildly dilated), mild LAE, mild RVE, lipomatous hypertrophy, mod TR, PASP 36 mmHg  . S/P CABG x 1 07/11/2013   SVG to PDA with EVH via right thigh  . Sinus bradycardia    a. 2013 - HR 40's in the office. BB stopped.  . Trigeminal neuropathy   . Vertigo     Patient Active Problem List   Diagnosis Date Noted  . Sinus node dysfunction ([place]) 12/09/2018  . Bradycardia 11/20/2018  . Typical atrial flutter ([place]) 11/13/2018  . Atrial flutter ([place]) 10/09/2018  . Heart block 10/09/2018  . Encounter for therapeutic drug monitoring 11/27/2016  . Atrial fibrillation [I48.91] 06/02/2014  . Acute venous embolism and thrombosis of deep vessels of proximal lower extremity [I82.4Y9] 06/02/2014  . Primary hypercoagulable state [D68.52] 06/02/2014  . S/P aortic valve replacement with bioprosthetic valve + CABG x1 07/11/2013  . S/P CABG x 1 07/11/2013  . Severe aortic stenosis 06/13/2013  . CAD (coronary artery disease)   .  CVA (cerebral infarction)   . GERD (gastroesophageal reflux disease)   . H/O sinus bradycardia   . Chest pain 06/12/2013  . HYPERLIPIDEMIA 06/03/2009  . Essential hypertension 06/03/2009  . DYSPEPSIA 06/03/2009    Past Surgical History:  Procedure Laterality Date  . A-FLUTTER ABLATION N/A 11/13/2018   Procedure: A-FLUTTER ABLATION;  Surgeon: Evans Lance, MD;  Location: [facility] CV LAB;  Service: Cardiovascular;  Laterality: N/A;  . AORTIC VALVE REPLACEMENT N/A 07/11/2013   Procedure: AORTIC VALVE REPLACEMENT (AVR);  Surgeon: [name], MD;  Location: [facility];  Service: Open Heart Surgery;  Laterality:  N/A;  . CATARACT EXTRACTION    . CORONARY ARTERY BYPASS GRAFT N/A 07/11/2013   Procedure: CORONARY ARTERY BYPASS GRAFTING (CABG) x1;  Surgeon: [name], MD;  Location: [place];  Service: Open Heart Surgery;  Laterality: N/A;  . INGUINAL HERNIA REPAIR Right 01/15/2013   Procedure: HERNIA REPAIR INGUINAL ADULT;  Surgeon: Harl Bowie, MD;  Location: [facility];  Service: General;  Laterality: Right;  . INSERTION OF MESH Right 01/15/2013   Procedure: INSERTION OF MESH;  Surgeon: Harl Bowie, MD;  Location: [place];  Service: General;  Laterality: Right;  . INTRAOPERATIVE TRANSESOPHAGEAL ECHOCARDIOGRAM N/A 07/11/2013   Procedure: INTRAOPERATIVE TRANSESOPHAGEAL ECHOCARDIOGRAM;  Surgeon: [name], MD;  Location: [employer];  Service: Open Heart Surgery;  Laterality: N/A;  . LEFT AND RIGHT HEART CATHETERIZATION WITH CORONARY ANGIOGRAM N/A 06/06/2013   Procedure: LEFT AND RIGHT HEART CATHETERIZATION WITH CORONARY ANGIOGRAM;  Surgeon: Larey Dresser, MD;  Location: [facility] CATH LAB;  Service: Cardiovascular;  Laterality: N/A;  . LITHOTRIPSY    . LOOP RECORDER IMPLANT  04/15/2013   MDT LinQ implanred by Dr Lovena Le for cryptogenic stroke  . LOOP RECORDER IMPLANT N/A 04/14/2013   Procedure: LOOP RECORDER IMPLANT;  Surgeon: Evans Lance, MD;  Location[facility] C[facility] LAB;  Service: Cardiovascular;  Laterality: N/A;  . LOOP RECORDER REMOVAL N/A 11/13/2018   Procedure: LOOP RECORDER REMOVAL;  Surgeon: Evans Lance, MD;  Location: [place] CV LAB;  Service: Cardiovascular;  Laterality: N/A;  . MOUTH SURGERY    . PACEMAKER IMPLANT N/A 12/09/2018   Procedure: PACEMAKER IMPLANT;  Surgeon: Evans Lance, MD;  Location: [place] CV LAB;  Service: Cardiovascular;  Laterality: N/A;        Home Medications    Prior to Admission medications   Medication Sig Start Date End Date Taking? Authorizing Provider  acetaminophen (TYLENOL) 500 MG tablet Take 500 mg by mouth  every 6 (six) hours as needed for mild pain.     [provider]  aspirin EC 81 MG EC tablet Take 1 tablet (81 mg total) by mouth daily. 07/15/13   Gold, Wayne E, PA-C  carvedilol (COREG) 3.125 MG tablet Take 1 tablet (3.125 mg total) by mouth 2 (two) times daily. 12/19/18   Shirley Friar, PA-C  chlorzoxazone (PARAFON) 500 MG tablet Take 500 mg by mouth 4 (four) times daily as needed for muscle spasms.    [provider]  meclizine (ANTIVERT) 25 MG tablet Take 25 mg by mouth 3 (three) times daily as needed for dizziness.    [provider]  Multiple Vitamins-Minerals (STRESS B COMPLEX/ZINC PO) Take 1 tablet by mouth 2 (two) times a week.     [provider]  omeprazole (PRILOSEC) 20 MG capsule Take 20 mg by mouth daily as needed (for acid reduction).  [provider]  Polyethyl Glycol-Propyl Glycol (SYSTANE OP) Place 1 drop into both eyes daily as needed (for dry eyes).     [provider]  polyethylene glycol (MIRALAX / GLYCOLAX) 17 g packet Take 17 g by mouth daily as needed (constipation.).    [provider]  rosuvastatin (CRESTOR) 10 MG tablet Take 10 mg by mouth every evening.     [provider]  tamsulosin (FLOMAX) 0.4 MG CAPS capsule Take 0.4 mg by mouth every evening.  03/18/15   [provider]  warfarin (COUMADIN) 5 MG tablet TAKE AS DIRECTED BY  COUMADIN  CLINIC 12/06/18   Evans Lance, MD    Family History Family History  Problem Relation Age of Onset  . Diabetes Mother   . Heart attack Mother   . Heart attack Father   . Hypertension Father   . Diabetes Unknown   . Hypertension Unknown   . Lung cancer Unknown   . Cancer Sister        breast  . Stroke Neg Hx     Social History Social History   Tobacco Use  . Smoking status: Former Smoker    Packs/day: 1.00    Years: 30.00    Pack years: 30.00    Types: Cigarettes    Quit date: 08/29/2009    Years since quitting: 9.3  .  Smokeless tobacco: Never Used  Substance Use Topics  . Alcohol use: No  . Drug use: No     Allergies   Sudafed [pseudoephedrine hcl] and Cardizem [diltiazem hcl]   Review of Systems Review of Systems  Constitutional: Negative for chills and fever.  HENT: Negative for ear pain and sore throat.   Eyes: Negative for pain and visual disturbance.  Respiratory: Negative for cough and shortness of breath.   Cardiovascular: Positive for chest pain and palpitations.  Gastrointestinal: Negative for abdominal pain and vomiting.  Genitourinary: Negative for dysuria and hematuria.  Musculoskeletal: Negative for arthralgias and back pain.  Skin: Negative for color change and rash.  Neurological: Negative for seizures and syncope.  All other systems reviewed and are negative.    Physical Exam Updated Vital Signs BP 127/83 (BP Location: Right Arm)   Pulse 64   Temp 97.7 F (36.5 C) (Oral)   Resp 18   SpO2 96%   Physical Exam Vitals signs and nursing note reviewed.  Constitutional:      Appearance: He is well-developed.  HENT:     Head: Normocephalic and atraumatic.  Eyes:     Conjunctiva/sclera: Conjunctivae normal.  Neck:     Musculoskeletal: Neck supple.  Cardiovascular:     Rate and Rhythm: Normal rate and regular rhythm.     Heart sounds: No murmur.  Pulmonary:     Effort: Pulmonary effort is normal. No respiratory distress.     Breath sounds: Normal breath sounds.     Comments: Pacemaker site is c/d/i, no erythema, no induration Abdominal:     Palpations: Abdomen is soft.     Tenderness: There is no abdominal tenderness.  Skin:    General: Skin is warm and dry.  Neurological:     Mental Status: He is alert.      ED Treatments / Results  Labs (all labs ordered are listed, but only abnormal results are displayed) Labs Reviewed  BASIC METABOLIC PANEL - Abnormal; Notable for the following components:      Result Value   Glucose, Bld 121 (*)    Calcium  8.8 (*)     All other components within normal limits  PROTIME-INR - Abnormal; Notable for the following components:   Prothrombin Time 20.5 (*)    INR 1.8 (*)    All other components within normal limits  CBC  TROPONIN I (HIGH SENSITIVITY)  TROPONIN I (HIGH SENSITIVITY)    EKG EKG Interpretation  Date/Time:  Friday December 20 2018 06:51:40 EDT Ventricular Rate:  65 PR Interval:  288 QRS Duration: 96 QT Interval:  398 QTC Calculation: 413 R Axis:   23 Text Interpretation:  Atrial-paced rhythm with prolonged AV conduction Anteroseptal infarct , age undetermined Abnormal ECG No change since last tracing on 12/20/2018 at 0630 Confirmed by Madalyn Rob [phone]) on 12/20/2018 8:53:10 AM   Radiology Dg Chest 2 View  Result Date: 12/20/2018 CLINICAL DATA:  Palpitations. Dizziness. Nausea. Coronary artery disease. Pacemaker was reprogrammed yesterday. EXAM: CHEST - 2 VIEW COMPARISON:  12/10/2018 FINDINGS: The heart size and mediastinal contours are within normal limits. Prior CABG and aortic valve replacement. Dual lead transvenous pacemaker remains in appropriate position. Both lungs are clear. No evidence of pneumothorax or pleural effusion. The visualized skeletal structures are unremarkable. IMPRESSION: No active cardiopulmonary disease. Electronically Signed   By: Marlaine Hind M.D.   On: 12/20/2018 07:30    Procedures Procedures (including critical care time)  Medications Ordered in ED Medications  sodium chloride flush (NS) 0.9 % injection 3 mL (has no administration in time range)     Initial Impression / Assessment and Plan / ED Course  I have reviewed the triage vital signs and the nursing notes.  Pertinent labs & imaging results that were available during my care of the patient were reviewed by me and considered in my medical decision making (see chart for details).  Clinical Course as of Dec 20 1534  Fri Dec 20, 2018  1000 Received update from [employer], no significant events noted on monitor, occasional PAC   [RD]  1000 Will review case with cards on call given recent procedure   [RD]  1011 Discussed with cards master, will connect me to on call for EP   [RD]  1024 Discussed case with [place], If device check okay, and patient remains asymptomatic, unlikely cardiac in etiology and no additional cardiac work up needed at this time   [RD]  1030 Rechecked patient, states has had a couple episodes of palptitations, reviewed monitor strip, appears to go into paced rhythm, ~100bpm, then spontaneously back to nsr; discussed with Cam[place], he will review further with device rep with [employer] to see if any changes are needed   [RD]  1359 Received notice from cards master, Camnitz recommends patient can be discharge home, no changes needed   [RD]    Clinical Course User Index [RD] Lucrezia Starch, MD       84 year old male atrial flutter s/p recent ablation, symptomatic bradycardia s/p recent pacemaker placement.  Presented to the ER after near syncopal episode and having episodes of palpitations.  Here well-appearing, stable vital signs.  I interrogated pacemaker, no concerning arrhythmias were noted.  Patient had additional episode of palpitations while in ER, on our rhythm strip appeared that he was in a paced rhythm at approximately 100 bpm as opposed to otherwise being in sinus rhythm.  I reviewed case with the EP physician on call who reviewed this with [place], recommended against any changes to pacemaker today.  Will recommend close follow-up with his primary cardiologist next week in  the office.  I reviewed return precautions with patient, will discharge home at this time.  Note on labs, INR was 1.8, he missed a dose a few days ago and tonight is due to take a full 5mg  dose. I discussed this with patient and wife - recommend that he continue current regimen, but have it rechecked next week for close monitoring.    After the discussed management above, the patient was determined to be safe for discharge.  The patient was in agreement with this plan and all questions regarding their care were answered.  ED return precautions were discussed and the patient will return to the ED with any significant worsening of condition.   Final Clinical Impressions(s) / ED Diagnoses   Final diagnoses:  Palpitations  Cardiac pacemaker    ED Discharge Orders    None       Lucrezia Starch, MD 12/20/18 1540    Lucrezia Starch, MD 12/30/18 [phone]

## 2018-12-20 NOTE — ED Triage Notes (Signed)
Patient states he had his pacemaker checked by his doctor yesterday and had some settings changed on it d/t recent history of palpitations. Patient states he was also told to start taking Coreg, which he did last night. Endorses having episode of palpitations and chest discomfort that began about 0430 this morning and lasted 5 minutes. He denies having symptoms since, currently has no chest pain or palpitations. Denies shortness of breath or vomiting, but states he did feel nauseous and dizzy at time of palpitations this morning. Resp e/u, skin w/d.

## 2018-12-22 LAB — CUP PACEART REMOTE DEVICE CHECK
Battery Remaining Longevity: 162 mo
Battery Remaining Percentage: 100 %
Brady Statistic RA Percent Paced: 48 %
Brady Statistic RV Percent Paced: 46 %
Date Time Interrogation Session: 20201016132500
Implantable Lead Implant Date: 20201005
Implantable Lead Implant Date: 20201005
Implantable Lead Location: 753859
Implantable Lead Location: 753860
Implantable Lead Model: 7742
Implantable Lead Model: 7841
Implantable Lead Serial Number: 1017357
Implantable Lead Serial Number: 1068132
Implantable Pulse Generator Implant Date: 20201005
Lead Channel Impedance Value: 587 Ohm
Lead Channel Impedance Value: 670 Ohm
Lead Channel Pacing Threshold Amplitude: 0.7 V
Lead Channel Pacing Threshold Amplitude: 1.4 V
Lead Channel Pacing Threshold Pulse Width: 0.4 ms
Lead Channel Pacing Threshold Pulse Width: 0.4 ms
Lead Channel Setting Pacing Amplitude: 3.5 V
Lead Channel Setting Pacing Amplitude: 3.5 V
Lead Channel Setting Pacing Pulse Width: 0.4 ms
Lead Channel Setting Sensing Sensitivity: 1.5 mV
Pulse Gen Serial Number: 890709

## 2018-12-23 ENCOUNTER — Telehealth: Payer: Self-pay | Admitting: Student

## 2018-12-23 ENCOUNTER — Other Ambulatory Visit: Payer: Self-pay

## 2018-12-23 ENCOUNTER — Ambulatory Visit (INDEPENDENT_AMBULATORY_CARE_PROVIDER_SITE_OTHER): Payer: Medicare Other | Admitting: Student

## 2018-12-23 ENCOUNTER — Encounter: Payer: Self-pay | Admitting: Student

## 2018-12-23 VITALS — BP 150/82 | HR 60 | Ht 75.0 in | Wt 198.2 lb

## 2018-12-23 DIAGNOSIS — I483 Typical atrial flutter: Secondary | ICD-10-CM

## 2018-12-23 LAB — CUP PACEART INCLINIC DEVICE CHECK
Date Time Interrogation Session: 20201019040000
Implantable Lead Implant Date: 20201005
Implantable Lead Implant Date: 20201005
Implantable Lead Location: 753859
Implantable Lead Location: 753860
Implantable Lead Model: 7742
Implantable Lead Model: 7841
Implantable Lead Serial Number: 1017357
Implantable Lead Serial Number: 1068132
Implantable Pulse Generator Implant Date: 20201005
Lead Channel Impedance Value: 603 Ohm
Lead Channel Impedance Value: 691 Ohm
Lead Channel Pacing Threshold Amplitude: 1 V
Lead Channel Pacing Threshold Amplitude: 1.1 V
Lead Channel Pacing Threshold Pulse Width: 0.4 ms
Lead Channel Pacing Threshold Pulse Width: 0.4 ms
Lead Channel Sensing Intrinsic Amplitude: 16.6 mV
Lead Channel Sensing Intrinsic Amplitude: 5.8 mV
Lead Channel Setting Pacing Amplitude: 3.5 V
Lead Channel Setting Pacing Amplitude: 3.5 V
Lead Channel Setting Pacing Pulse Width: 0.4 ms
Lead Channel Setting Sensing Sensitivity: 1.5 mV
Pulse Gen Serial Number: 890709

## 2018-12-23 NOTE — Telephone Encounter (Signed)
Call returned to pt / pt's wife.  She denies pt needing assistance to his appt.  Alternatives were discussed with pt and wife to involve wife in the appointment.  They have cell phones and will have wife on speaker phone . They were concerned with new swelling in his feet/ankles and I offered a sooner appt with Jonni Sanger for today, 12/23/2018 at 10:55 since he had an opening so cancelled appt 12/26/2018.  They were very grateful for the call back and sooner appt.

## 2018-12-23 NOTE — Telephone Encounter (Signed)
  Wife would like to come with the patient to his appt with [facility] on 12/26/18 because it is a follow up to his ER visit and she would like to be present to discuss his issues.

## 2018-12-23 NOTE — Progress Notes (Signed)
Electrophysiology Office Note Date: 12/23/2018  ID:  Sean Brock, DOB 1935/02/22, MRN [id number]  PCP: Gaynelle Arabian, MD Primary Cardiologist: No primary care provider on file. Electrophysiologist: None  CC: Pacemaker follow-up  Sean Brock is a 84 y.o. male seen today for Dr. Lovena Le. he presents today for ER follow up. Seen in device clinic last week and noted symptomatic V pacing at higher rates. Coreg started in attempt to keep rates down.  Seen in ED over weekend for further palpitations and peripheral edema. Per notes, was discussed with EP on call who recommended no changes at this time.   Pt comes back in today with continued symptoms. He feels a sudden sensation in his chest like a little "shock" which has been shown to correlate with V pacing. He has occasionally lightheadedness and feels like his heart is racing at times. He denies symptoms of exertional chest pain, shortness of breath, orthopnea, PND, lower extremity edema, claudication, presyncope, syncope, bleeding, or neurologic sequela. He doesn't feel like the addition of coreg has made much of an adjustment.   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 AORTIC STENOSIS    a. Echo (05/26/13):  Mod LVH, vigorous LVF, EF 65-70%, no RWMA, Gr 1 DD, severe AS (mean 40 mmHg) => s/p bioprosthetic AVR 07/2013  . Arthritis    BACK  . CAD (coronary artery disease)    a. Lexiscan myoview (5/13) with EF 70%, no ischemia or infarction;   b. LHC (06/2013):  Dist LM 30%, ostial LAD 40-50%, prox LAD 30%, ostial D1 80-90%, prox CFX 40%, RCA occluded with L-R collats => CABG (S-PDA at time of AVR)  . CVA (cerebral infarction)    a. 3/14 right parietal infarct. Carotid US 3/14 showed no significant disease. He has an implanted loop recorder to look for atrial fibrillation.   . DYSPEPSIA   . ED (erectile dysfunction)   .  Former smoker   . GERD (gastroesophageal reflux disease)   . History of kidney stones   . HYPERLIPIDEMIA   . HYPERTENSION   . Palpitations    a. Event monitor (5/13) with occasional PVCs, PACs but no significant arrhythmia.   . S/P aortic valve replacement with bioprosthetic valve + CABG x1 07/11/2013   23 mm [facility] Ease bovine pericardial tissue valve;  Echo (08/2013):  EF 55-60%, no RWMA, AVR ok, Asc Ao 40 mm (mildly dilated), mild LAE, mild RVE, lipomatous hypertrophy, mod TR, PASP 36 mmHg  . S/P CABG x 1 07/11/2013   SVG to PDA with EVH via right thigh  . Sinus bradycardia    a. 2013 - HR 40's in the office. BB stopped.  . Trigeminal neuropathy   . Vertigo    Past Surgical History:  Procedure Laterality Date  . A-FLUTTER ABLATION N/A 11/13/2018   Procedure: A-FLUTTER ABLATION;  Surgeon: Evans Lance, MD;  Location: [place] [facility];  Service: Cardiovascular;  Laterality: N/A;  . AORTIC VALVE REPLACEMENT N/A 07/11/2013   Procedure: AORTIC VALVE REPLACEMENT (AVR);  Surgeon: [name], MD;  Location: [place];  Service: Open Heart Surgery;  Laterality: N/A;  . CATARACT EXTRACTION    . CORONARY ARTERY BYPASS GRAFT N/A 07/11/2013   Procedure: CORONARY ARTERY BYPASS GRAFTING (CABG) x1;  Surgeon: [name], MD;  Location: [place];  Service: Open Heart Surgery;  Laterality: N/A;  . INGUINAL HERNIA  REPAIR Right 01/15/2013   Procedure: HERNIA REPAIR INGUINAL ADULT;  Surgeon: Harl Bowie, MD;  Location: [place];  Service: General;  Laterality: Right;  . INSERTION OF MESH Right 01/15/2013   Procedure: INSERTION OF MESH;  Surgeon: Harl Bowie, MD;  Location: [place];  Service: General;  Laterality: Right;  . INTRAOPERATIVE TRANSESOPHAGEAL ECHOCARDIOGRAM N/A 07/11/2013   Procedure: INTRAOPERATIVE TRANSESOPHAGEAL ECHOCARDIOGRAM;  Surgeon: [name], MD;  Location: [place];  Service: Open Heart Surgery;  Laterality: N/A;  . LEFT AND RIGHT  HEART CATHETERIZATION WITH CORONARY ANGIOGRAM N/A 06/06/2013   Procedure: LEFT AND RIGHT HEART CATHETERIZATION WITH CORONARY ANGIOGRAM;  Surgeon: Larey Dresser, MD;  Location: [facility] C[facility] LAB;  Service: Cardiovascular;  Laterality: N/A;  . LITHOTRIPSY    . LOOP RECORDER IMPLANT  04/15/2013   MDT LinQ implanred by Dr Lovena Le for cryptogenic stroke  . LOOP RECORDER IMPLANT N/A 04/14/2013   Procedure: LOOP RECORDER IMPLANT;  Surgeon: Evans Lance, MD;  Location: [facility] C[facility] LAB;  Service: Cardiovascular;  Laterality: N/A;  . LOOP RECORDER REMOVAL N/A 11/13/2018   Procedure: LOOP RECORDER REMOVAL;  Surgeon: Evans Lance, MD;  Location: [place] [facility];  Service: Cardiovascular;  Laterality: N/A;  . MOUTH SURGERY    . PACEMAKER IMPLANT N/A 12/09/2018   Procedure: PACEMAKER IMPLANT;  Surgeon: Evans Lance, MD;  Location: [place] [facility];  Service: Cardiovascular;  Laterality: N/A;    Current Outpatient Medications  Medication Sig Dispense Refill  . acetaminophen (TYLENOL) 500 MG tablet Take 500 mg by mouth every 6 (six) hours as needed for mild pain.     Marland Kitchen aspirin EC 81 MG EC tablet Take 1 tablet (81 mg total) by mouth daily.    . carvedilol (COREG) 3.125 MG tablet Take 1 tablet (3.125 mg total) by mouth 2 (two) times daily. 180 tablet 3  . chlorzoxazone (PARAFON) 500 MG tablet Take 500 mg by mouth 4 (four) times daily as needed for muscle spasms.    . meclizine (ANTIVERT) 25 MG tablet Take 25 mg by mouth 3 (three) times daily as needed for dizziness.    . Multiple Vitamins-Minerals (STRESS B COMPLEX/ZINC PO) Take 1 tablet by mouth 2 (two) times a week.     Marland Kitchen omeprazole (PRILOSEC) 20 MG capsule Take 20 mg by mouth daily as needed (for acid reduction).     Vladimir Faster Glycol-Propyl Glycol (SYSTANE OP) Place 1 drop into both eyes daily as needed (for dry eyes).     . polyethylene glycol (MIRALAX / GLYCOLAX) 17 g packet Take 17 g by mouth daily as needed (constipation.).    Marland Kitchen rosuvastatin (CRESTOR) 10  MG tablet Take 10 mg by mouth every evening.     . tamsulosin (FLOMAX) 0.4 MG CAPS capsule Take 0.4 mg by mouth every evening.     . warfarin (COUMADIN) 5 MG tablet TAKE AS DIRECTED BY  COUMADIN  CLINIC 70 tablet 0   No current facility-administered medications for this visit.     Allergies:   Sudafed [pseudoephedrine hcl] and Cardizem [diltiazem hcl]   Social History: Social History   Socioeconomic History  . Marital status: Married    Spouse name: Not on file  . Number of children: Not on file  . Years of education: Not on file  . Highest education level: Not on file  Occupational History  . Not on file  Social Needs  . Financial resource strain: Not on file  .  Food insecurity    Worry: Not on file    Inability: Not on file  . Transportation needs    Medical: Not on file    Non-medical: Not on file  Tobacco Use  . Smoking status: Former Smoker    Packs/day: 1.00    Years: 30.00    Pack years: 30.00    Types: Cigarettes    Quit date: 08/29/2009    Years since quitting: 9.3  . Smokeless tobacco: Never Used  Substance and Sexual Activity  . Alcohol use: No  . Drug use: No  . Sexual activity: Not Currently  Lifestyle  . Physical activity    Days per week: Not on file    Minutes per session: Not on file  . Stress: Not on file  Relationships  . Social Herbalist on phone: Not on file    Gets together: Not on file    Attends religious service: Not on file    Active member of club or organization: Not on file    Attends meetings of clubs or organizations: Not on file    Relationship status: Not on file  . Intimate partner violence    Fear of current or ex partner: Not on file    Emotionally abused: Not on file    Physically abused: Not on file    Forced sexual activity: Not on file  Other Topics Concern  . Not on file  Social History Narrative   He resides in [place] with his wife and son. He is employed    as a Art gallery manager at [employer]. He has one son and  one daughter.  No    grandchildren. He smokes half a pack per day intermittently for the last 40    years. He drinks a glass of homemade wine mixed with diet Coke one time per    week. He denies any drugs, herbal medication, diet, or exercise program.          Family History: Family History  Problem Relation Age of Onset  . Diabetes Mother   . Heart attack Mother   . Heart attack Father   . Hypertension Father   . Diabetes Unknown   . Hypertension Unknown   . Lung cancer Unknown   . Cancer Sister        breast  . Stroke Neg Hx      Review of Systems: All other systems reviewed and are otherwise negative except as noted above.  Physical Exam: Vitals:   12/23/18 1055  BP: (!) 150/82  Pulse: 60  SpO2: 98%  Weight: 198 lb 3.2 oz (89.9 kg)  Height: 6\' 3"  (1.905 m)     GEN- The patient is well appearing, alert and oriented x 3 today.   HEENT: normocephalic, atraumatic; sclera clear, conjunctiva pink; hearing intact; oropharynx clear; neck supple  Lungs- Clear to ausculation bilaterally, normal work of breathing.  No wheezes, rales, rhonchi Heart- Regular rate and rhythm, no murmurs, rubs or gallops  GI- soft, non-tender, non-distended, bowel sounds present  Extremities- no clubbing, cyanosis, or edema  MS- no significant deformity or atrophy Skin- warm and dry, no rash or lesion; PPM pocket well healed Psych- euthymic mood, full affect Neuro- strength and sensation are intact  PPM Interrogation- reviewed in detail today,  See PACEART report  EKG:  EKG is not ordered today. EKG from 12/20/2018 shows A pacing at times. Baseline appears labile, but no AF or AFL noted during this time  period by his device.   Recent Labs: 11/17/2018: Magnesium 1.9 12/20/2018: BUN 19; Creatinine, Ser 0.85; Hemoglobin 14.2; Platelets 207; Potassium 4.1; Sodium 140   Wt Readings from Last 3 Encounters:  12/23/18 198 lb 3.2 oz (89.9 kg)  12/10/18 191 lb 2.2 oz (86.7 kg)  11/20/18 196 lb  (88.9 kg)     Other studies Reviewed: Additional studies/ records that were reviewed today include: Echo 08/2013 shows LVEF 55-60% and stable AVR, Previous EP office notes, Previous device check, Most recent labwork.   Assessment and Plan:  1.  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 Curt Bears and [facility] with [employer].   RhythmIQ turned on (AAI with back up VVI pacing) in attempt to limit V pacing, as patient with intact A -> V conduction today.   2. Atrial flutter He had 1 episode of AT vs AF on 10/17, but no further.  Coreg recently added. No further change today.   3. HTN Coreg as above.    Current medicines are reviewed at length with the patient today.   The patient does not have concerns regarding his medicines.  The following changes were made today:  none  Disposition:   Follow up with me in 1 month to re-evaluate symptoms with changes.   Jacalyn Lefevre, PA-C  12/23/2018 12:40 PM  [place] [id number] [phone] (office) [phone] (fax)

## 2018-12-23 NOTE — Patient Instructions (Signed)
Medication Instructions:   Your physician recommends that you continue on your current medications as directed. Please refer to the Current Medication list given to you today.  *If you need a refill on your cardiac medications before your next appointment, please call your pharmacy*  Lab Work:  None ordered today  Testing/Procedures:  None ordered today  Follow-Up: At [facility], you and your health needs are our priority.  As part of our continuing mission to provide you with exceptional heart care, we have created designated Provider Care Teams.  These Care Teams include your primary Cardiologist (physician) and Advanced Practice Providers (APPs -  Physician Assistants and Nurse Practitioners) who all work together to provide you with the care you need, when you need it.  01/27/19 at 10:55AM with Legrand Como "Oda Kilts, PA-C

## 2018-12-26 ENCOUNTER — Ambulatory Visit: Payer: Medicare Other | Admitting: *Deleted

## 2018-12-26 ENCOUNTER — Other Ambulatory Visit: Payer: Self-pay

## 2018-12-26 ENCOUNTER — Ambulatory Visit: Payer: Medicare Other | Admitting: Student

## 2018-12-26 DIAGNOSIS — D6859 Other primary thrombophilia: Secondary | ICD-10-CM

## 2018-12-26 DIAGNOSIS — I824Y9 Acute embolism and thrombosis of unspecified deep veins of unspecified proximal lower extremity: Secondary | ICD-10-CM | POA: Diagnosis not present

## 2018-12-26 DIAGNOSIS — Z953 Presence of xenogenic heart valve: Secondary | ICD-10-CM

## 2018-12-26 DIAGNOSIS — I4891 Unspecified atrial fibrillation: Secondary | ICD-10-CM

## 2018-12-26 DIAGNOSIS — Z5181 Encounter for therapeutic drug level monitoring: Secondary | ICD-10-CM

## 2018-12-26 LAB — POCT INR: INR: 2.5 (ref 2.0–3.0)

## 2018-12-26 NOTE — Patient Instructions (Signed)
Description   Continue taking Coumadin 1/2 tablet daily except 1 tablet on Mondays and Fridays. Recheck INR in 5 weeks. Please call [phone] with any new medications or if scheduled for any procedures.

## 2019-01-26 NOTE — Progress Notes (Signed)
Electrophysiology Office Note Date: 01/27/2019  ID:  Sean Brock, DOB August 28, 1934, MRN [id number]  PCP: Gaynelle Arabian, MD Primary Cardiologist: No primary care provider on file. Electrophysiologist: Dr. Lovena Le  CC: Pacemaker follow-up  Sean Brock is a 84 y.o. male seen today for Dr. Lovena Le. he presents today for routine electrophysiology followup.  Since last being seen in our clinic, the patient reports doing very well.  he denies chest pain, dyspnea, PND, orthopnea, nausea, vomiting, dizziness, syncope, edema, weight gain, or early satiety. He has felt less of the "funny feeling" in his chest since RhythmIQ turned on.  He says he still gets it occasionally after "lying down after a large meal" or when his HR gets up.   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 AORTIC STENOSIS    a. Echo (05/26/13):  Mod LVH, vigorous LVF, EF 65-70%, no RWMA, Gr 1 DD, severe AS (mean 40 mmHg) => s/p bioprosthetic AVR 07/2013  . Arthritis    BACK  . CAD (coronary artery disease)    a. Lexiscan myoview (5/13) with EF 70%, no ischemia or infarction;   b. LHC (06/2013):  Dist LM 30%, ostial LAD 40-50%, prox LAD 30%, ostial D1 80-90%, prox CFX 40%, RCA occluded with L-R collats => CABG (S-PDA at time of AVR)  . CVA (cerebral infarction)    a. 3/14 right parietal infarct. Carotid US 3/14 showed no significant disease. He has an implanted loop recorder to look for atrial fibrillation.   . DYSPEPSIA   . ED (erectile dysfunction)   . Former smoker   . GERD (gastroesophageal reflux disease)   . History of kidney stones   . HYPERLIPIDEMIA   . HYPERTENSION   . Palpitations    a. Event monitor (5/13) with occasional PVCs, PACs but no significant arrhythmia.   . S/P aortic valve replacement with bioprosthetic valve + CABG x1 07/11/2013   23 mm [facility] Ease bovine pericardial tissue  valve;  Echo (08/2013):  EF 55-60%, no RWMA, AVR ok, Asc Ao 40 mm (mildly dilated), mild LAE, mild RVE, lipomatous hypertrophy, mod TR, PASP 36 mmHg  . S/P CABG x 1 07/11/2013   SVG to PDA with EVH via right thigh  . Sinus bradycardia    a. 2013 - HR 40's in the office. BB stopped.  . Trigeminal neuropathy   . Vertigo    Past Surgical History:  Procedure Laterality Date  . A-FLUTTER ABLATION N/A 11/13/2018   Procedure: A-FLUTTER ABLATION;  Surgeon: Evans Lance, MD;  Location: [place] [facility];  Service: Cardiovascular;  Laterality: N/A;  . AORTIC VALVE REPLACEMENT N/A 07/11/2013   Procedure: AORTIC VALVE REPLACEMENT (AVR);  Surgeon: [name], MD;  Location: [place];  Service: Open Heart Surgery;  Laterality: N/A;  . CATARACT EXTRACTION    . CORONARY ARTERY BYPASS GRAFT N/A 07/11/2013   Procedure: CORONARY ARTERY BYPASS GRAFTING (CABG) x1;  Surgeon: [name], MD;  Location: [place];  Service: Open Heart Surgery;  Laterality: N/A;  . INGUINAL HERNIA REPAIR Right 01/15/2013   Procedure: HERNIA REPAIR INGUINAL ADULT;  Surgeon: Harl Bowie, MD;  Location: [place];  Service: General;  Laterality: Right;  . INSERTION OF MESH Right 01/15/2013   Procedure: INSERTION OF MESH;  Surgeon: Harl Bowie, MD;  Location: [place];  Service: General;  Laterality: Right;  .  INTRAOPERATIVE TRANSESOPHAGEAL ECHOCARDIOGRAM N/A 07/11/2013   Procedure: INTRAOPERATIVE TRANSESOPHAGEAL ECHOCARDIOGRAM;  Surgeon: [name], MD;  Location: [place];  Service: Open Heart Surgery;  Laterality: N/A;  . LEFT AND RIGHT HEART CATHETERIZATION WITH CORONARY ANGIOGRAM N/A 06/06/2013   Procedure: LEFT AND RIGHT HEART CATHETERIZATION WITH CORONARY ANGIOGRAM;  Surgeon: Larey Dresser, MD;  Location: [facility] CATH LAB;  Service: Cardiovascular;  Laterality: N/A;  . LITHOTRIPSY    . LOOP RECORDER IMPLANT  04/15/2013   MDT LinQ implanred by Dr Lovena Le for cryptogenic stroke  . LOOP  RECORDER IMPLANT N/A 04/14/2013   Procedure: LOOP RECORDER IMPLANT;  Surgeon: Evans Lance, MD;  Location: [facility] CATH LAB;  Service: Cardiovascular;  Laterality: N/A;  . LOOP RECORDER REMOVAL N/A 11/13/2018   Procedure: LOOP RECORDER REMOVAL;  Surgeon: Evans Lance, MD;  Location: [place] [facility];  Service: Cardiovascular;  Laterality: N/A;  . MOUTH SURGERY    . PACEMAKER IMPLANT N/A 12/09/2018   Procedure: PACEMAKER IMPLANT;  Surgeon: Evans Lance, MD;  Location: [place] [facility];  Service: Cardiovascular;  Laterality: N/A;    Current Outpatient Medications  Medication Sig Dispense Refill  . acetaminophen (TYLENOL) 500 MG tablet Take 500 mg by mouth every 6 (six) hours as needed for mild pain.     Marland Kitchen aspirin EC 81 MG EC tablet Take 1 tablet (81 mg total) by mouth daily.    . carvedilol (COREG) 3.125 MG tablet Take 1 tablet (3.125 mg total) by mouth 2 (two) times daily. 180 tablet 3  . chlorzoxazone (PARAFON) 500 MG tablet Take 500 mg by mouth 4 (four) times daily as needed for muscle spasms.    . meclizine (ANTIVERT) 25 MG tablet Take 25 mg by mouth 3 (three) times daily as needed for dizziness.    . Multiple Vitamins-Minerals (STRESS B COMPLEX/ZINC PO) Take 1 tablet by mouth 2 (two) times a week.     Marland Kitchen omeprazole (PRILOSEC) 20 MG capsule Take 20 mg by mouth daily as needed (for acid reduction).     Vladimir Faster Glycol-Propyl Glycol (SYSTANE OP) Place 1 drop into both eyes daily as needed (for dry eyes).     . polyethylene glycol (MIRALAX / GLYCOLAX) 17 g packet Take 17 g by mouth daily as needed (constipation.).    Marland Kitchen rosuvastatin (CRESTOR) 10 MG tablet Take 10 mg by mouth every evening.     . tamsulosin (FLOMAX) 0.4 MG CAPS capsule Take 0.4 mg by mouth every evening.     . warfarin (COUMADIN) 5 MG tablet TAKE AS DIRECTED BY  COUMADIN  CLINIC 70 tablet 0   No current facility-administered medications for this visit.     Allergies:   Sudafed [pseudoephedrine hcl] and Cardizem [diltiazem  hcl]   Social History: Social History   Socioeconomic History  . Marital status: Married    Spouse name: Not on file  . Number of children: Not on file  . Years of education: Not on file  . Highest education level: Not on file  Occupational History  . Not on file  Social Needs  . Financial resource strain: Not on file  . Food insecurity    Worry: Not on file    Inability: Not on file  . Transportation needs    Medical: Not on file    Non-medical: Not on file  Tobacco Use  . Smoking status: Former Smoker    Packs/day: 1.00    Years: 30.00    Pack years: 30.00  Types: Cigarettes    Quit date: 08/29/2009    Years since quitting: 9.4  . Smokeless tobacco: Never Used  Substance and Sexual Activity  . Alcohol use: No  . Drug use: No  . Sexual activity: Not Currently  Lifestyle  . Physical activity    Days per week: Not on file    Minutes per session: Not on file  . Stress: Not on file  Relationships  . Social Herbalist on phone: Not on file    Gets together: Not on file    Attends religious service: Not on file    Active member of club or organization: Not on file    Attends meetings of clubs or organizations: Not on file    Relationship status: Not on file  . Intimate partner violence    Fear of current or ex partner: Not on file    Emotionally abused: Not on file    Physically abused: Not on file    Forced sexual activity: Not on file  Other Topics Concern  . Not on file  Social History Narrative   He resides in [place] with his wife and son. 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  No    grandchildren. He smokes half a pack per day intermittently for the last 40    years. He drinks a glass of homemade wine mixed with diet Coke one time per    week. He denies any drugs, herbal medication, diet, or exercise program.          Family History: Family History  Problem Relation Age of Onset  . Diabetes Mother    . Heart attack Mother   . Heart attack Father   . Hypertension Father   . Diabetes Unknown   . Hypertension Unknown   . Lung cancer Unknown   . Cancer Sister        breast  . Stroke Neg Hx      Review of Systems: All other systems reviewed and are otherwise negative except as noted above.  Physical Exam: Vitals:   01/27/19 1042  BP: 136/64  Pulse: 63  SpO2: 97%  Weight: 197 lb (89.4 kg)  Height: 6\' 3"  (1.905 m)     GEN- The patient is well appearing, alert and oriented x 3 today.   HEENT: normocephalic, atraumatic; sclera clear, conjunctiva pink; hearing intact; oropharynx clear; neck supple  Lungs- Clear to ausculation bilaterally, normal work of breathing.  No wheezes, rales, rhonchi Heart- Regular rate and rhythm, no murmurs, rubs or gallops  GI- soft, non-tender, non-distended, bowel sounds present  Extremities- no clubbing, cyanosis, or edema  MS- no significant deformity or atrophy Skin- warm and dry, no rash or lesion; PPM pocket well healed Psych- euthymic mood, full affect Neuro- strength and sensation are intact  PPM Interrogation- reviewed in detail today,  See PACEART report  EKG:  EKG is not ordered today.  Recent Labs: 11/17/2018: Magnesium 1.9 12/20/2018: BUN 19; Creatinine, Ser 0.85; Hemoglobin 14.2; Platelets 207; Potassium 4.1; Sodium 140   Wt Readings from Last 3 Encounters:  01/27/19 197 lb (89.4 kg)  12/23/18 198 lb 3.2 oz (89.9 kg)  12/10/18 191 lb 2.2 oz (86.7 kg)     Other studies Reviewed: Additional studies/ records that were reviewed today include: Echo 08/2013 shows LVEF 55-60%, Previous EP office notes, Previous remote checks, Most recent labwork.   Assessment and Plan:  1.  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 Atrial flutter 0% burden by device.  Continue coreg Coumadin visit today.  CHA2DS2VASC of 4    3. HTN Continue current  medications  Current medicines are reviewed at length with the patient today.   The patient does not have concerns regarding his medicines.  The following changes were made today:  none  Disposition:   Follow up with Dr. Lovena Le in January as scheduled.    Jacalyn Lefevre, PA-C  01/27/2019 10:53 AM  [facility] [address] [place]  [address] [phone] (office) [phone] (fax)

## 2019-01-27 ENCOUNTER — Other Ambulatory Visit: Payer: Self-pay

## 2019-01-27 ENCOUNTER — Ambulatory Visit (INDEPENDENT_AMBULATORY_CARE_PROVIDER_SITE_OTHER): Payer: Medicare Other | Admitting: *Deleted

## 2019-01-27 ENCOUNTER — Ambulatory Visit: Payer: Medicare Other | Admitting: Student

## 2019-01-27 VITALS — BP 136/64 | HR 63 | Ht 75.0 in | Wt 197.0 lb

## 2019-01-27 DIAGNOSIS — I824Y9 Acute embolism and thrombosis of unspecified deep veins of unspecified proximal lower extremity: Secondary | ICD-10-CM | POA: Diagnosis not present

## 2019-01-27 DIAGNOSIS — Z5181 Encounter for therapeutic drug level monitoring: Secondary | ICD-10-CM | POA: Diagnosis not present

## 2019-01-27 DIAGNOSIS — I483 Typical atrial flutter: Secondary | ICD-10-CM

## 2019-01-27 DIAGNOSIS — Z8679 Personal history of other diseases of the circulatory system: Secondary | ICD-10-CM

## 2019-01-27 DIAGNOSIS — Z953 Presence of xenogenic heart valve: Secondary | ICD-10-CM

## 2019-01-27 DIAGNOSIS — I4891 Unspecified atrial fibrillation: Secondary | ICD-10-CM

## 2019-01-27 DIAGNOSIS — D6859 Other primary thrombophilia: Secondary | ICD-10-CM

## 2019-01-27 LAB — CUP PACEART INCLINIC DEVICE CHECK
Brady Statistic RA Percent Paced: 48 %
Brady Statistic RV Percent Paced: 57 %
Date Time Interrogation Session: 20201123111240
Implantable Lead Implant Date: 20201005
Implantable Lead Implant Date: 20201005
Implantable Lead Location: 753859
Implantable Lead Location: 753860
Implantable Lead Model: 7742
Implantable Lead Model: 7841
Implantable Lead Serial Number: 1017357
Implantable Lead Serial Number: 1068132
Implantable Pulse Generator Implant Date: 20201005
Lead Channel Pacing Threshold Amplitude: 1 V
Lead Channel Pacing Threshold Amplitude: 1.3 V
Lead Channel Pacing Threshold Pulse Width: 0.4 ms
Lead Channel Pacing Threshold Pulse Width: 0.4 ms
Lead Channel Setting Pacing Amplitude: 3.5 V
Lead Channel Setting Pacing Amplitude: 3.5 V
Lead Channel Setting Pacing Pulse Width: 0.4 ms
Lead Channel Setting Sensing Sensitivity: 1.5 mV
Pulse Gen Serial Number: 890709

## 2019-01-27 LAB — POCT INR: INR: 1.7 — AB (ref 2.0–3.0)

## 2019-01-27 NOTE — Patient Instructions (Signed)
Medication Instructions:  Your physician recommends that you continue on your current medications as directed. Please refer to the Current Medication list given to you today.  *If you need a refill on your cardiac medications before your next appointment, please call your pharmacy*  Lab Work: NONE ORDERED  TODAY   If you have labs (blood work) drawn today and your tests are completely normal, you will receive your results only by: . MyChart Message (if you have MyChart) OR . A paper copy in the mail If you have any lab test that is abnormal or we need to change your treatment, we will call you to review the results.  Testing/Procedures: NONE ORDERED  TODAY   Follow-Up: At [facility], you and your health needs are our priority.  As part of our continuing mission to provide you with exceptional heart care, we have created designated Provider Care Teams.  These Care Teams include your primary Cardiologist (physician) and Advanced Practice Providers (APPs -  Physician Assistants and Nurse Practitioners) who all work together to provide you with the care you need, when you need it.  Your next appointment:  AS SCHEDULED   Other Instructions   

## 2019-01-27 NOTE — Patient Instructions (Signed)
Description    Take 1.5 tablets today, then Continue taking Coumadin 1/2 tablet daily except 1 tablet on Mondays and Fridays. Recheck INR in 3 weeks. Please call [phone] with any new medications or if scheduled for any procedures.

## 2019-02-14 ENCOUNTER — Encounter: Payer: Medicare Other | Admitting: Internal Medicine

## 2019-02-17 ENCOUNTER — Other Ambulatory Visit: Payer: Self-pay

## 2019-02-17 ENCOUNTER — Ambulatory Visit: Payer: Medicare Other | Admitting: *Deleted

## 2019-02-17 DIAGNOSIS — Z5181 Encounter for therapeutic drug level monitoring: Secondary | ICD-10-CM | POA: Diagnosis not present

## 2019-02-17 DIAGNOSIS — Z953 Presence of xenogenic heart valve: Secondary | ICD-10-CM | POA: Diagnosis not present

## 2019-02-17 DIAGNOSIS — I824Y9 Acute embolism and thrombosis of unspecified deep veins of unspecified proximal lower extremity: Secondary | ICD-10-CM | POA: Diagnosis not present

## 2019-02-17 DIAGNOSIS — D6859 Other primary thrombophilia: Secondary | ICD-10-CM | POA: Diagnosis not present

## 2019-02-17 DIAGNOSIS — I4891 Unspecified atrial fibrillation: Secondary | ICD-10-CM

## 2019-02-17 LAB — POCT INR: INR: 2.4 (ref 2.0–3.0)

## 2019-02-17 NOTE — Patient Instructions (Signed)
Description   Continue taking Coumadin 1/2 tablet daily except 1 tablet on Mondays and Fridays. Recheck INR in  Weeks with MD appt. Please call [phone] with any new medications or if scheduled for any procedures.

## 2019-02-19 ENCOUNTER — Ambulatory Visit: Payer: Medicare Other | Admitting: Internal Medicine

## 2019-03-13 ENCOUNTER — Other Ambulatory Visit: Payer: Self-pay

## 2019-03-13 ENCOUNTER — Ambulatory Visit (INDEPENDENT_AMBULATORY_CARE_PROVIDER_SITE_OTHER): Payer: Medicare Other | Admitting: Pharmacist

## 2019-03-13 ENCOUNTER — Ambulatory Visit: Payer: Medicare Other | Admitting: Internal Medicine

## 2019-03-13 ENCOUNTER — Encounter: Payer: Self-pay | Admitting: Internal Medicine

## 2019-03-13 VITALS — BP 130/82 | HR 67 | Ht 72.0 in | Wt 202.4 lb

## 2019-03-13 DIAGNOSIS — I4891 Unspecified atrial fibrillation: Secondary | ICD-10-CM | POA: Diagnosis not present

## 2019-03-13 DIAGNOSIS — I483 Typical atrial flutter: Secondary | ICD-10-CM

## 2019-03-13 DIAGNOSIS — D6859 Other primary thrombophilia: Secondary | ICD-10-CM

## 2019-03-13 DIAGNOSIS — I495 Sick sinus syndrome: Secondary | ICD-10-CM

## 2019-03-13 DIAGNOSIS — I10 Essential (primary) hypertension: Secondary | ICD-10-CM | POA: Diagnosis not present

## 2019-03-13 DIAGNOSIS — Z5181 Encounter for therapeutic drug level monitoring: Secondary | ICD-10-CM | POA: Diagnosis not present

## 2019-03-13 DIAGNOSIS — Z953 Presence of xenogenic heart valve: Secondary | ICD-10-CM

## 2019-03-13 DIAGNOSIS — Z950 Presence of cardiac pacemaker: Secondary | ICD-10-CM | POA: Diagnosis not present

## 2019-03-13 DIAGNOSIS — I824Y9 Acute embolism and thrombosis of unspecified deep veins of unspecified proximal lower extremity: Secondary | ICD-10-CM

## 2019-03-13 LAB — POCT INR: INR: 2.2 (ref 2.0–3.0)

## 2019-03-13 NOTE — Patient Instructions (Signed)
Description   Continue taking Coumadin 1/2 tablet daily except 1 tablet on Mondays and Fridays. Recheck INR in 6 weeks. Please call [phone] with any new medications or if scheduled for any procedures.

## 2019-03-13 NOTE — Patient Instructions (Signed)
Medication Instructions:  Your physician recommends that you continue on your current medications as directed. Please refer to the Current Medication list given to you today.  Labwork: None ordered.  Testing/Procedures: None ordered.  Follow-Up: Your physician wants you to follow-up in: one year with Dr. Lovena Le.   You will receive a reminder letter in the mail two months in advance. If you don't receive a letter, please call our office to schedule the follow-up appointment.  Remote monitoring is used to monitor your Pacemaker from home. This monitoring reduces the number of office visits required to check your device to one time per year. It allows [place] to keep an eye on the functioning of your device to ensure it is working properly. You are scheduled for a device check from home on 06/17/2019. You may send your transmission at any time that day. If you have a wireless device, the transmission will be sent automatically. After your physician reviews your transmission, you will receive a postcard with your next transmission date.  Any Other Special Instructions Will Be Listed Below (If Applicable).  If you need a refill on your cardiac medications before your next appointment, please call your pharmacy.

## 2019-03-13 NOTE — Progress Notes (Signed)
HPI Sean Brock returns today for followup of sinus node dysfunction, s/p PPM, Atrial flutter s/p ablation, and remote aortic valve replacement. He still has some occaisional dizzy spells. He has minimal palpitations. Allergies  Allergen Reactions  . Sudafed [Pseudoephedrine Hcl] Palpitations  . Cardizem [Diltiazem Hcl] Rash     Current Outpatient Medications  Medication Sig Dispense Refill  . acetaminophen (TYLENOL) 500 MG tablet Take 500 mg by mouth every 6 (six) hours as needed for mild pain.     Marland Kitchen aspirin EC 81 MG EC tablet Take 1 tablet (81 mg total) by mouth daily.    . carvedilol (COREG) 3.125 MG tablet Take 1 tablet (3.125 mg total) by mouth 2 (two) times daily. 180 tablet 3  . chlorzoxazone (PARAFON) 500 MG tablet Take 500 mg by mouth 4 (four) times daily as needed for muscle spasms.    . meclizine (ANTIVERT) 25 MG tablet Take 25 mg by mouth 3 (three) times daily as needed for dizziness.    . Multiple Vitamins-Minerals (STRESS B COMPLEX/ZINC PO) Take 1 tablet by mouth 2 (two) times a week.     Marland Kitchen omeprazole (PRILOSEC) 20 MG capsule Take 20 mg by mouth daily as needed (for acid reduction).     Vladimir Faster Glycol-Propyl Glycol (SYSTANE OP) Place 1 drop into both eyes daily as needed (for dry eyes).     . polyethylene glycol (MIRALAX / GLYCOLAX) 17 g packet Take 17 g by mouth daily as needed (constipation.).    Marland Kitchen rosuvastatin (CRESTOR) 10 MG tablet Take 10 mg by mouth every evening.     . tamsulosin (FLOMAX) 0.4 MG CAPS capsule Take 0.4 mg by mouth every evening.     . warfarin (COUMADIN) 5 MG tablet TAKE AS DIRECTED BY  COUMADIN  CLINIC 70 tablet 0   No current facility-administered medications for this visit.     Past Medical History:  Diagnosis Date  . AORTIC STENOSIS    a. Echo (05/26/13):  Mod LVH, vigorous LVF, EF 65-70%, no RWMA, Gr 1 DD, severe AS (mean 40 mmHg) => s/p bioprosthetic AVR 07/2013  . Arthritis    BACK  . CAD (coronary artery disease)    a. Lexiscan  myoview (5/13) with EF 70%, no ischemia or infarction;   b. LHC (06/2013):  Dist LM 30%, ostial LAD 40-50%, prox LAD 30%, ostial D1 80-90%, prox CFX 40%, RCA occluded with L-R collats => CABG (S-PDA at time of AVR)  . CVA (cerebral infarction)    a. 3/14 right parietal infarct. Carotid US 3/14 showed no significant disease. He has an implanted loop recorder to look for atrial fibrillation.   . DYSPEPSIA   . ED (erectile dysfunction)   . Former smoker   . GERD (gastroesophageal reflux disease)   . History of kidney stones   . HYPERLIPIDEMIA   . HYPERTENSION   . Palpitations    a. Event monitor (5/13) with occasional PVCs, PACs but no significant arrhythmia.   . S/P aortic valve replacement with bioprosthetic valve + CABG x1 07/11/2013   23 mm [facility] Ease bovine pericardial tissue valve;  Echo (08/2013):  EF 55-60%, no RWMA, AVR ok, Asc Ao 40 mm (mildly dilated), mild LAE, mild RVE, lipomatous hypertrophy, mod TR, PASP 36 mmHg  . S/P CABG x 1 07/11/2013   SVG to PDA with EVH via right thigh  . Sinus bradycardia    a. 2013 - HR 40's in the office. BB stopped.  Marland Kitchen  Trigeminal neuropathy   . Vertigo     ROS:   All systems reviewed and negative except as noted in the HPI.   Past Surgical History:  Procedure Laterality Date  . A-FLUTTER ABLATION N/A 11/13/2018   Procedure: A-FLUTTER ABLATION;  Surgeon: Evans Lance, MD;  Location: [place] [facility];  Service: Cardiovascular;  Laterality: N/A;  . AORTIC VALVE REPLACEMENT N/A 07/11/2013   Procedure: AORTIC VALVE REPLACEMENT (AVR);  Surgeon: [name], MD;  Location: [place];  Service: Open Heart Surgery;  Laterality: N/A;  . CATARACT EXTRACTION    . CORONARY ARTERY BYPASS GRAFT N/A 07/11/2013   Procedure: CORONARY ARTERY BYPASS GRAFTING (CABG) x1;  Surgeon: [name], MD;  Location: [place];  Service: Open Heart Surgery;  Laterality: N/A;  . INGUINAL HERNIA REPAIR Right 01/15/2013   Procedure: HERNIA REPAIR INGUINAL ADULT;  Surgeon:  Harl Bowie, MD;  Location: [place];  Service: General;  Laterality: Right;  . INSERTION OF MESH Right 01/15/2013   Procedure: INSERTION OF MESH;  Surgeon: Harl Bowie, MD;  Location: [place];  Service: General;  Laterality: Right;  . INTRAOPERATIVE TRANSESOPHAGEAL ECHOCARDIOGRAM N/A 07/11/2013   Procedure: INTRAOPERATIVE TRANSESOPHAGEAL ECHOCARDIOGRAM;  Surgeon: [name], MD;  Location: [place];  Service: Open Heart Surgery;  Laterality: N/A;  . LEFT AND RIGHT HEART CATHETERIZATION WITH CORONARY ANGIOGRAM N/A 06/06/2013   Procedure: LEFT AND RIGHT HEART CATHETERIZATION WITH CORONARY ANGIOGRAM;  Surgeon: Larey Dresser, MD;  Location[facility] C[facility] LAB;  Service: Cardiovascular;  Laterality: N/A;  . LITHOTRIPSY    . LOOP RECORDER IMPLANT  04/15/2013   MDT LinQ implanred by Dr Lovena Le for cryptogenic stroke  . LOOP RECORDER IMPLANT N/A 04/14/2013   Procedure: LOOP RECORDER IMPLANT;  Surgeon: Evans Lance, MD;  Location[facility] C[facility] LAB;  Service: Cardiovascular;  Laterality: N/A;  . LOOP RECORDER REMOVAL N/A 11/13/2018   Procedure: LOOP RECORDER REMOVAL;  Surgeon: Evans Lance, MD;  Location: [place] [facility];  Service: Cardiovascular;  Laterality: N/A;  . MOUTH SURGERY    . PACEMAKER IMPLANT N/A 12/09/2018   Procedure: PACEMAKER IMPLANT;  Surgeon: Evans Lance, MD;  Location: [place] [facility];  Service: Cardiovascular;  Laterality: N/A;     Family History  Problem Relation Age of Onset  . Diabetes Mother   . Heart attack Mother   . Heart attack Father   . Hypertension Father   . Diabetes Unknown   . Hypertension Unknown   . Lung cancer Unknown   . Cancer Sister        breast  . Stroke Neg Hx      Social History   Socioeconomic History  . Marital status: Married    Spouse name: Not on file  . Number of children: Not on file  . Years of education: Not on file  . Highest education level: Not on file  Occupational History  . Not  on file  Tobacco Use  . Smoking status: Former Smoker    Packs/day: 1.00    Years: 30.00    Pack years: 30.00    Types: Cigarettes    Quit date: 08/29/2009    Years since quitting: 9.5  . Smokeless tobacco: Never Used  Substance and Sexual Activity  . Alcohol use: No  . Drug use: No  . Sexual activity: Not Currently  Other Topics Concern  . Not on file  Social History Narrative   He resides  in [place] with his wife and son. 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  No    grandchildren. He smokes half a pack per day intermittently for the last 40    years. He drinks a glass of homemade wine mixed with diet Coke one time per    week. He denies any drugs, herbal medication, diet, or exercise program.         Social Determinants of Health   Financial Resource Strain:   . Difficulty of Paying Living Expenses: Not on file  Food Insecurity:   . Worried About Charity fundraiser in the Last Year: Not on file  . Ran Out of Food in the Last Year: Not on file  Transportation Needs:   . Lack of Transportation (Medical): Not on file  . Lack of Transportation (Non-Medical): Not on file  Physical Activity:   . Days of Exercise per Week: Not on file  . Minutes of Exercise per Session: Not on file  Stress:   . Feeling of Stress : Not on file  Social Connections:   . Frequency of Communication with Friends and Family: Not on file  . Frequency of Social Gatherings with Friends and Family: Not on file  . Attends Religious Services: Not on file  . Active Member of Clubs or Organizations: Not on file  . Attends Archivist Meetings: Not on file  . Marital Status: Not on file  Intimate Partner Violence:   . Fear of Current or Ex-Partner: Not on file  . Emotionally Abused: Not on file  . Physically Abused: Not on file  . Sexually Abused: Not on file     BP 130/82   Pulse 67   Ht 6' (1.829 m)   Wt 202 lb 6.4 oz (91.8 kg)   SpO2 97%   BMI  27.45 kg/m   Physical Exam:  Well appearing NAD HEENT: Unremarkable Neck:  No JVD, no thyromegally Lymphatics:  No adenopathy Back:  No CVA tenderness Lungs:  Clear with no wheezes HEART:  Regular rate rhythm, no murmurs, no rubs, no clicks Abd:  soft, positive bowel sounds, no organomegally, no rebound, no guarding Ext:  2 plus pulses, no edema, no cyanosis, no clubbing Skin:  No rashes no nodules Neuro:  CN II through XII intact, motor grossly intact  EKG - nsr with atrial pacing  DEVICE  Normal device function.  See PaceArt for details.   Assess/Plan: 1. Sinus node dysfunction - he is asymptomatic, s/p PPM insertion.  2. PPM - his [place] sci DDD PM is working normally and he is pacing in the atrium. 3. Atrial flutter - he has maintained NSR. He will continue his current meds.  4. HTN - his bp is well controlled. No change in his meds.  Sean Brock.D.

## 2019-03-24 ENCOUNTER — Other Ambulatory Visit: Payer: Self-pay | Admitting: Internal Medicine

## 2019-04-02 ENCOUNTER — Ambulatory Visit (INDEPENDENT_AMBULATORY_CARE_PROVIDER_SITE_OTHER): Payer: Medicare Other | Admitting: *Deleted

## 2019-04-02 DIAGNOSIS — Z950 Presence of cardiac pacemaker: Secondary | ICD-10-CM | POA: Diagnosis not present

## 2019-04-02 LAB — CUP PACEART REMOTE DEVICE CHECK
Battery Remaining Longevity: 162 mo
Battery Remaining Percentage: 100 %
Brady Statistic RA Percent Paced: 54 %
Brady Statistic RV Percent Paced: 36 %
Date Time Interrogation Session: 20210127055000
Implantable Lead Implant Date: 20201005
Implantable Lead Implant Date: 20201005
Implantable Lead Location: 753859
Implantable Lead Location: 753860
Implantable Lead Model: 7742
Implantable Lead Model: 7841
Implantable Lead Serial Number: 1017357
Implantable Lead Serial Number: 1068132
Implantable Pulse Generator Implant Date: 20201005
Lead Channel Impedance Value: 581 Ohm
Lead Channel Impedance Value: 690 Ohm
Lead Channel Pacing Threshold Amplitude: 1 V
Lead Channel Pacing Threshold Pulse Width: 0.4 ms
Lead Channel Setting Pacing Amplitude: 2.3 V
Lead Channel Setting Pacing Amplitude: 2.4 V
Lead Channel Setting Pacing Pulse Width: 0.4 ms
Lead Channel Setting Sensing Sensitivity: 1.5 mV
Pulse Gen Serial Number: 890709

## 2019-04-21 ENCOUNTER — Ambulatory Visit: Payer: Medicare Other

## 2019-04-21 ENCOUNTER — Other Ambulatory Visit: Payer: Self-pay

## 2019-04-21 DIAGNOSIS — I4891 Unspecified atrial fibrillation: Secondary | ICD-10-CM | POA: Diagnosis not present

## 2019-04-21 DIAGNOSIS — D6859 Other primary thrombophilia: Secondary | ICD-10-CM

## 2019-04-21 DIAGNOSIS — Z5181 Encounter for therapeutic drug level monitoring: Secondary | ICD-10-CM | POA: Diagnosis not present

## 2019-04-21 DIAGNOSIS — I824Y9 Acute embolism and thrombosis of unspecified deep veins of unspecified proximal lower extremity: Secondary | ICD-10-CM | POA: Diagnosis not present

## 2019-04-21 DIAGNOSIS — Z953 Presence of xenogenic heart valve: Secondary | ICD-10-CM

## 2019-04-21 LAB — POCT INR: INR: 5 — AB (ref 2.0–3.0)

## 2019-04-21 NOTE — Patient Instructions (Signed)
Description   Skip today and tomorrow's dosage of Coumadin, then start taking 1/2 tablet daily except 1 tablet on Mondays. Recheck INR in 10 days. Please call [phone] with any new medications or if scheduled for any procedures.

## 2019-05-01 ENCOUNTER — Other Ambulatory Visit: Payer: Self-pay

## 2019-05-01 ENCOUNTER — Ambulatory Visit: Payer: Medicare Other | Admitting: Pharmacist

## 2019-05-01 DIAGNOSIS — D6859 Other primary thrombophilia: Secondary | ICD-10-CM

## 2019-05-01 DIAGNOSIS — Z5181 Encounter for therapeutic drug level monitoring: Secondary | ICD-10-CM | POA: Diagnosis not present

## 2019-05-01 DIAGNOSIS — I824Y9 Acute embolism and thrombosis of unspecified deep veins of unspecified proximal lower extremity: Secondary | ICD-10-CM

## 2019-05-01 DIAGNOSIS — I4891 Unspecified atrial fibrillation: Secondary | ICD-10-CM | POA: Diagnosis not present

## 2019-05-01 DIAGNOSIS — Z953 Presence of xenogenic heart valve: Secondary | ICD-10-CM

## 2019-05-01 LAB — POCT INR: INR: 1.9 — AB (ref 2.0–3.0)

## 2019-05-01 NOTE — Patient Instructions (Signed)
Description   Take 1 tablet today, then continue taking 1/2 tablet daily except 1 tablet on Mondays. Recheck INR in 2-3 weeks. Please call [phone] with any new medications or if scheduled for any procedures.

## 2019-05-02 ENCOUNTER — Ambulatory Visit: Payer: Medicare Other | Attending: Internal Medicine

## 2019-05-02 DIAGNOSIS — Z23 Encounter for immunization: Secondary | ICD-10-CM

## 2019-05-02 NOTE — Progress Notes (Signed)
   Covid-19 Vaccination Clinic  Name:  Sean Brock    MRN: [id number] DOB: [birth year]  05/02/2019  [name] was observed post Covid-19 immunization for 15 minutes without incidence. He was provided with Vaccine Information Sheet and instruction to access the V-Safe system.   [name] was instructed to call 911 with any severe reactions post vaccine: Marland Kitchen Difficulty breathing  . Swelling of your face and throat  . A fast heartbeat  . A bad rash all over your body  . Dizziness and weakness    Immunizations Administered    Name Date Dose VIS Date Route   Pfizer COVID-19 Vaccine 05/02/2019  9:39 AM 0.3 mL 02/14/2019 Intramuscular   Manufacturer: [place]   Lot: [id number]   [place]: [id number]

## 2019-05-14 ENCOUNTER — Other Ambulatory Visit: Payer: Self-pay | Admitting: Gastroenterology

## 2019-05-14 DIAGNOSIS — R0789 Other chest pain: Secondary | ICD-10-CM

## 2019-05-19 ENCOUNTER — Ambulatory Visit: Payer: Medicare Other | Admitting: Pharmacist

## 2019-05-19 ENCOUNTER — Other Ambulatory Visit: Payer: Self-pay

## 2019-05-19 DIAGNOSIS — Z953 Presence of xenogenic heart valve: Secondary | ICD-10-CM

## 2019-05-19 DIAGNOSIS — I4891 Unspecified atrial fibrillation: Secondary | ICD-10-CM | POA: Diagnosis not present

## 2019-05-19 DIAGNOSIS — D6859 Other primary thrombophilia: Secondary | ICD-10-CM

## 2019-05-19 DIAGNOSIS — I824Y9 Acute embolism and thrombosis of unspecified deep veins of unspecified proximal lower extremity: Secondary | ICD-10-CM | POA: Diagnosis not present

## 2019-05-19 DIAGNOSIS — Z5181 Encounter for therapeutic drug level monitoring: Secondary | ICD-10-CM

## 2019-05-19 LAB — POCT INR: INR: 3 (ref 2.0–3.0)

## 2019-05-19 NOTE — Patient Instructions (Signed)
Continue 1/2 tablet daily except 1 tablet on Mondays. Recheck INR in 3 weeks. Please call [phone] with any new medications or if scheduled for any procedures.

## 2019-05-23 ENCOUNTER — Ambulatory Visit
Admission: RE | Admit: 2019-05-23 | Discharge: 2019-05-23 | Disposition: A | Discharge status: Home / Self Care | Payer: Medicare Other | Source: Ambulatory Visit | Attending: Gastroenterology | Admitting: Gastroenterology

## 2019-05-23 ENCOUNTER — Other Ambulatory Visit: Payer: Self-pay | Admitting: Gastroenterology

## 2019-05-23 DIAGNOSIS — R0789 Other chest pain: Secondary | ICD-10-CM

## 2019-05-28 ENCOUNTER — Ambulatory Visit: Payer: Medicare Other | Attending: Internal Medicine

## 2019-05-28 DIAGNOSIS — Z23 Encounter for immunization: Secondary | ICD-10-CM

## 2019-05-28 NOTE — Progress Notes (Signed)
   Covid-19 Vaccination Clinic  Name:  Sean Brock    MRN: [id number] DOB: June 13, 1934  05/28/2019  Sean Brock was observed post Covid-19 immunization for 15 minutes without incident. He was provided with Vaccine Information Sheet and instruction to access the V-Safe system.   Sean Brock was instructed to call 911 with any severe reactions post vaccine: Marland Kitchen Difficulty breathing  . Swelling of face and throat  . A fast heartbeat  . A bad rash all over body  . Dizziness and weakness   Immunizations Administered    Name Date Dose VIS Date Route   Pfizer COVID-19 Vaccine 05/28/2019 12:23 PM 0.3 mL 02/14/2019 Intramuscular   Manufacturer: [place]   Lot: [id number]   [place]: [id number]

## 2019-06-09 ENCOUNTER — Ambulatory Visit: Payer: Medicare Other | Admitting: Pharmacist

## 2019-06-09 ENCOUNTER — Other Ambulatory Visit: Payer: Self-pay

## 2019-06-09 DIAGNOSIS — I824Y9 Acute embolism and thrombosis of unspecified deep veins of unspecified proximal lower extremity: Secondary | ICD-10-CM

## 2019-06-09 DIAGNOSIS — D6859 Other primary thrombophilia: Secondary | ICD-10-CM | POA: Diagnosis not present

## 2019-06-09 DIAGNOSIS — Z953 Presence of xenogenic heart valve: Secondary | ICD-10-CM

## 2019-06-09 DIAGNOSIS — I4891 Unspecified atrial fibrillation: Secondary | ICD-10-CM

## 2019-06-09 DIAGNOSIS — Z5181 Encounter for therapeutic drug level monitoring: Secondary | ICD-10-CM

## 2019-06-09 LAB — POCT INR: INR: 2.2 (ref 2.0–3.0)

## 2019-06-09 NOTE — Patient Instructions (Signed)
Description   Continue 1/2 tablet daily except 1 tablet on Mondays. Recheck INR in 4 weeks. Please call [phone] with any new medications or if scheduled for any procedures.

## 2019-07-01 ENCOUNTER — Ambulatory Visit (INDEPENDENT_AMBULATORY_CARE_PROVIDER_SITE_OTHER): Payer: Medicare Other | Admitting: *Deleted

## 2019-07-01 DIAGNOSIS — Z950 Presence of cardiac pacemaker: Secondary | ICD-10-CM

## 2019-07-01 LAB — CUP PACEART REMOTE DEVICE CHECK
Battery Remaining Longevity: 162 mo
Battery Remaining Percentage: 100 %
Brady Statistic RA Percent Paced: 49 %
Brady Statistic RV Percent Paced: 41 %
Date Time Interrogation Session: 20210427034000
Implantable Lead Implant Date: 20201005
Implantable Lead Implant Date: 20201005
Implantable Lead Location: 753859
Implantable Lead Location: 753860
Implantable Lead Model: 7742
Implantable Lead Model: 7841
Implantable Lead Serial Number: 1017357
Implantable Lead Serial Number: 1068132
Implantable Pulse Generator Implant Date: 20201005
Lead Channel Impedance Value: 589 Ohm
Lead Channel Impedance Value: 709 Ohm
Lead Channel Pacing Threshold Amplitude: 1.1 V
Lead Channel Pacing Threshold Pulse Width: 0.4 ms
Lead Channel Setting Pacing Amplitude: 2.3 V
Lead Channel Setting Pacing Amplitude: 2.4 V
Lead Channel Setting Pacing Pulse Width: 0.4 ms
Lead Channel Setting Sensing Sensitivity: 1.5 mV
Pulse Gen Serial Number: 890709

## 2019-07-02 NOTE — Progress Notes (Signed)
PPM Remote  

## 2019-07-07 ENCOUNTER — Ambulatory Visit: Payer: Medicare Other | Admitting: *Deleted

## 2019-07-07 ENCOUNTER — Other Ambulatory Visit: Payer: Self-pay

## 2019-07-07 DIAGNOSIS — I4891 Unspecified atrial fibrillation: Secondary | ICD-10-CM

## 2019-07-07 DIAGNOSIS — Z5181 Encounter for therapeutic drug level monitoring: Secondary | ICD-10-CM

## 2019-07-07 DIAGNOSIS — D6859 Other primary thrombophilia: Secondary | ICD-10-CM | POA: Diagnosis not present

## 2019-07-07 DIAGNOSIS — I824Y9 Acute embolism and thrombosis of unspecified deep veins of unspecified proximal lower extremity: Secondary | ICD-10-CM | POA: Diagnosis not present

## 2019-07-07 DIAGNOSIS — Z953 Presence of xenogenic heart valve: Secondary | ICD-10-CM

## 2019-07-07 LAB — POCT INR: INR: 2.1 (ref 2.0–3.0)

## 2019-07-07 NOTE — Patient Instructions (Signed)
Description   °Continue taking 1/2 tablet daily except 1 tablet on Mondays. Recheck INR in 6 weeks. Please call [phone] with any new medications or if scheduled for any procedures.  °  ° °

## 2019-07-20 ENCOUNTER — Other Ambulatory Visit: Payer: Self-pay | Admitting: Internal Medicine

## 2019-08-18 ENCOUNTER — Other Ambulatory Visit: Payer: Self-pay

## 2019-08-18 ENCOUNTER — Ambulatory Visit: Payer: Medicare Other | Admitting: *Deleted

## 2019-08-18 DIAGNOSIS — Z5181 Encounter for therapeutic drug level monitoring: Secondary | ICD-10-CM | POA: Diagnosis not present

## 2019-08-18 DIAGNOSIS — I4891 Unspecified atrial fibrillation: Secondary | ICD-10-CM | POA: Diagnosis not present

## 2019-08-18 DIAGNOSIS — I824Y9 Acute embolism and thrombosis of unspecified deep veins of unspecified proximal lower extremity: Secondary | ICD-10-CM | POA: Diagnosis not present

## 2019-08-18 DIAGNOSIS — D6859 Other primary thrombophilia: Secondary | ICD-10-CM

## 2019-08-18 DIAGNOSIS — Z953 Presence of xenogenic heart valve: Secondary | ICD-10-CM

## 2019-08-18 LAB — POCT INR: INR: 2 (ref 2.0–3.0)

## 2019-08-18 NOTE — Patient Instructions (Signed)
Description   Continue taking 1/2 tablet daily except 1 tablet on Mondays. Recheck INR in 6 weeks. Please call [phone] with any new medications or if scheduled for any procedures.

## 2019-09-29 ENCOUNTER — Other Ambulatory Visit: Payer: Self-pay

## 2019-09-29 ENCOUNTER — Ambulatory Visit: Payer: Medicare Other | Admitting: *Deleted

## 2019-09-29 DIAGNOSIS — D6859 Other primary thrombophilia: Secondary | ICD-10-CM | POA: Diagnosis not present

## 2019-09-29 DIAGNOSIS — I824Y9 Acute embolism and thrombosis of unspecified deep veins of unspecified proximal lower extremity: Secondary | ICD-10-CM | POA: Diagnosis not present

## 2019-09-29 DIAGNOSIS — Z953 Presence of xenogenic heart valve: Secondary | ICD-10-CM

## 2019-09-29 DIAGNOSIS — Z5181 Encounter for therapeutic drug level monitoring: Secondary | ICD-10-CM | POA: Diagnosis not present

## 2019-09-29 DIAGNOSIS — I4891 Unspecified atrial fibrillation: Secondary | ICD-10-CM | POA: Diagnosis not present

## 2019-09-29 LAB — POCT INR: INR: 2.3 (ref 2.0–3.0)

## 2019-09-29 NOTE — Patient Instructions (Signed)
Description   Continue taking 1/2 tablet daily except 1 tablet on Mondays. Recheck INR in 6 weeks. Please call [phone] with any new medications or if scheduled for any procedures.

## 2019-09-30 ENCOUNTER — Ambulatory Visit (INDEPENDENT_AMBULATORY_CARE_PROVIDER_SITE_OTHER): Payer: Medicare Other | Admitting: *Deleted

## 2019-09-30 DIAGNOSIS — I459 Conduction disorder, unspecified: Secondary | ICD-10-CM | POA: Diagnosis not present

## 2019-10-01 LAB — CUP PACEART REMOTE DEVICE CHECK
Battery Remaining Longevity: 150 mo
Battery Remaining Percentage: 100 %
Brady Statistic RA Percent Paced: 44 %
Brady Statistic RV Percent Paced: 43 %
Date Time Interrogation Session: 20210727061800
Implantable Lead Implant Date: 20201005
Implantable Lead Implant Date: 20201005
Implantable Lead Location: 753859
Implantable Lead Location: 753860
Implantable Lead Model: 7742
Implantable Lead Model: 7841
Implantable Lead Serial Number: 1017357
Implantable Lead Serial Number: 1068132
Implantable Pulse Generator Implant Date: 20201005
Lead Channel Impedance Value: 592 Ohm
Lead Channel Impedance Value: 710 Ohm
Lead Channel Pacing Threshold Amplitude: 0.7 V
Lead Channel Pacing Threshold Pulse Width: 0.4 ms
Lead Channel Setting Pacing Amplitude: 2.3 V
Lead Channel Setting Pacing Amplitude: 2.4 V
Lead Channel Setting Pacing Pulse Width: 0.4 ms
Lead Channel Setting Sensing Sensitivity: 1.5 mV
Pulse Gen Serial Number: 890709

## 2019-10-03 NOTE — Progress Notes (Signed)
Remote pacemaker transmission.   

## 2019-11-11 ENCOUNTER — Ambulatory Visit: Payer: Medicare Other | Admitting: *Deleted

## 2019-11-11 ENCOUNTER — Other Ambulatory Visit: Payer: Self-pay

## 2019-11-11 DIAGNOSIS — D6859 Other primary thrombophilia: Secondary | ICD-10-CM

## 2019-11-11 DIAGNOSIS — I824Y9 Acute embolism and thrombosis of unspecified deep veins of unspecified proximal lower extremity: Secondary | ICD-10-CM

## 2019-11-11 DIAGNOSIS — Z953 Presence of xenogenic heart valve: Secondary | ICD-10-CM

## 2019-11-11 DIAGNOSIS — I4891 Unspecified atrial fibrillation: Secondary | ICD-10-CM

## 2019-11-11 DIAGNOSIS — Z5181 Encounter for therapeutic drug level monitoring: Secondary | ICD-10-CM

## 2019-11-11 LAB — POCT INR: INR: 2.2 (ref 2.0–3.0)

## 2019-11-11 NOTE — Patient Instructions (Signed)
Description   Continue taking Warfarin 1/2 tablet daily except 1 tablet on Mondays. Recheck INR in 7 weeks. Please call [phone] with any new medications or if scheduled for any procedures.

## 2019-12-04 ENCOUNTER — Other Ambulatory Visit: Payer: Self-pay | Admitting: Internal Medicine

## 2019-12-19 MED FILL — ROSUVASTATIN CALCIUM 10 MG: 10 | 90 days supply | Qty: 90 | Fill #0

## 2019-12-30 ENCOUNTER — Ambulatory Visit: Payer: Medicare Other | Admitting: Pharmacist

## 2019-12-30 ENCOUNTER — Ambulatory Visit (INDEPENDENT_AMBULATORY_CARE_PROVIDER_SITE_OTHER): Payer: Medicare Other

## 2019-12-30 ENCOUNTER — Other Ambulatory Visit: Payer: Self-pay

## 2019-12-30 DIAGNOSIS — D6859 Other primary thrombophilia: Secondary | ICD-10-CM

## 2019-12-30 DIAGNOSIS — I459 Conduction disorder, unspecified: Secondary | ICD-10-CM

## 2019-12-30 DIAGNOSIS — I4891 Unspecified atrial fibrillation: Secondary | ICD-10-CM | POA: Diagnosis not present

## 2019-12-30 DIAGNOSIS — Z5181 Encounter for therapeutic drug level monitoring: Secondary | ICD-10-CM | POA: Diagnosis not present

## 2019-12-30 DIAGNOSIS — Z953 Presence of xenogenic heart valve: Secondary | ICD-10-CM | POA: Diagnosis not present

## 2019-12-30 DIAGNOSIS — I824Y9 Acute embolism and thrombosis of unspecified deep veins of unspecified proximal lower extremity: Secondary | ICD-10-CM

## 2019-12-30 LAB — CUP PACEART REMOTE DEVICE CHECK
Battery Remaining Longevity: 156 mo
Battery Remaining Percentage: 100 %
Brady Statistic RA Percent Paced: 46 %
Brady Statistic RV Percent Paced: 40 %
Date Time Interrogation Session: 20211026035400
Implantable Lead Implant Date: 20201005
Implantable Lead Implant Date: 20201005
Implantable Lead Location: 753859
Implantable Lead Location: 753860
Implantable Lead Model: 7742
Implantable Lead Model: 7841
Implantable Lead Serial Number: 1017357
Implantable Lead Serial Number: 1068132
Implantable Pulse Generator Implant Date: 20201005
Lead Channel Impedance Value: 572 Ohm
Lead Channel Impedance Value: 706 Ohm
Lead Channel Pacing Threshold Amplitude: 1 V
Lead Channel Pacing Threshold Pulse Width: 0.4 ms
Lead Channel Setting Pacing Amplitude: 2.3 V
Lead Channel Setting Pacing Amplitude: 2.4 V
Lead Channel Setting Pacing Pulse Width: 0.4 ms
Lead Channel Setting Sensing Sensitivity: 1.5 mV
Pulse Gen Serial Number: 890709

## 2019-12-30 LAB — POCT INR: INR: 2.4 (ref 2.0–3.0)

## 2019-12-30 NOTE — Patient Instructions (Signed)
Description   Continue taking Warfarin 1/2 tablet daily except 1 tablet on Mondays. Recheck INR in 8 weeks. Please call [phone] with any new medications or if scheduled for any procedures.       

## 2020-01-02 NOTE — Progress Notes (Signed)
Remote pacemaker transmission.   

## 2020-01-19 ENCOUNTER — Other Ambulatory Visit: Payer: Self-pay | Admitting: Student

## 2020-01-19 DIAGNOSIS — I4891 Unspecified atrial fibrillation: Secondary | ICD-10-CM

## 2020-01-20 ENCOUNTER — Other Ambulatory Visit: Payer: Self-pay | Admitting: Student

## 2020-01-20 DIAGNOSIS — I4891 Unspecified atrial fibrillation: Secondary | ICD-10-CM

## 2020-02-24 ENCOUNTER — Other Ambulatory Visit: Payer: Self-pay

## 2020-02-24 ENCOUNTER — Ambulatory Visit (INDEPENDENT_AMBULATORY_CARE_PROVIDER_SITE_OTHER): Payer: Medicare Other

## 2020-02-24 DIAGNOSIS — Z5181 Encounter for therapeutic drug level monitoring: Secondary | ICD-10-CM | POA: Diagnosis not present

## 2020-02-24 DIAGNOSIS — I4891 Unspecified atrial fibrillation: Secondary | ICD-10-CM | POA: Diagnosis not present

## 2020-02-24 DIAGNOSIS — D6859 Other primary thrombophilia: Secondary | ICD-10-CM

## 2020-02-24 DIAGNOSIS — I824Y9 Acute embolism and thrombosis of unspecified deep veins of unspecified proximal lower extremity: Secondary | ICD-10-CM

## 2020-02-24 DIAGNOSIS — Z953 Presence of xenogenic heart valve: Secondary | ICD-10-CM

## 2020-02-24 LAB — POCT INR: INR: 2.4 (ref 2.0–3.0)

## 2020-02-24 NOTE — Patient Instructions (Signed)
Description   Continue taking Warfarin 1/2 tablet daily except 1 tablet on Mondays. Recheck INR in 8 weeks. Please call [phone] with any new medications or if scheduled for any procedures.       

## 2020-03-30 ENCOUNTER — Ambulatory Visit (INDEPENDENT_AMBULATORY_CARE_PROVIDER_SITE_OTHER): Payer: Medicare Other

## 2020-03-30 DIAGNOSIS — I495 Sick sinus syndrome: Secondary | ICD-10-CM

## 2020-03-30 LAB — CUP PACEART REMOTE DEVICE CHECK
Battery Remaining Longevity: 156 mo
Battery Remaining Percentage: 100 %
Brady Statistic RA Percent Paced: 47 %
Brady Statistic RV Percent Paced: 41 %
Date Time Interrogation Session: 20220125034100
Implantable Lead Implant Date: 20201005
Implantable Lead Implant Date: 20201005
Implantable Lead Location: 753859
Implantable Lead Location: 753860
Implantable Lead Model: 7742
Implantable Lead Model: 7841
Implantable Lead Serial Number: 1017357
Implantable Lead Serial Number: 1068132
Implantable Pulse Generator Implant Date: 20201005
Lead Channel Impedance Value: 564 Ohm
Lead Channel Impedance Value: 685 Ohm
Lead Channel Pacing Threshold Amplitude: 0.9 V
Lead Channel Pacing Threshold Pulse Width: 0.4 ms
Lead Channel Setting Pacing Amplitude: 2.3 V
Lead Channel Setting Pacing Amplitude: 2.4 V
Lead Channel Setting Pacing Pulse Width: 0.4 ms
Lead Channel Setting Sensing Sensitivity: 1.5 mV
Pulse Gen Serial Number: 890709

## 2020-04-12 NOTE — Progress Notes (Signed)
Remote pacemaker transmission.   

## 2020-04-20 ENCOUNTER — Ambulatory Visit (INDEPENDENT_AMBULATORY_CARE_PROVIDER_SITE_OTHER): Payer: Medicare Other | Admitting: *Deleted

## 2020-04-20 ENCOUNTER — Other Ambulatory Visit: Payer: Self-pay

## 2020-04-20 DIAGNOSIS — Z5181 Encounter for therapeutic drug level monitoring: Secondary | ICD-10-CM | POA: Diagnosis not present

## 2020-04-20 DIAGNOSIS — D6859 Other primary thrombophilia: Secondary | ICD-10-CM

## 2020-04-20 DIAGNOSIS — I4891 Unspecified atrial fibrillation: Secondary | ICD-10-CM | POA: Diagnosis not present

## 2020-04-20 DIAGNOSIS — I824Y9 Acute embolism and thrombosis of unspecified deep veins of unspecified proximal lower extremity: Secondary | ICD-10-CM

## 2020-04-20 DIAGNOSIS — Z953 Presence of xenogenic heart valve: Secondary | ICD-10-CM | POA: Diagnosis not present

## 2020-04-20 LAB — POCT INR: INR: 2.8 (ref 2.0–3.0)

## 2020-04-20 NOTE — Patient Instructions (Signed)
Description   Continue taking Warfarin 1/2 tablet daily except 1 tablet on Mondays. Recheck INR in 8 weeks. Please call [phone] with any new medications or if scheduled for any procedures.       

## 2020-06-14 ENCOUNTER — Ambulatory Visit (INDEPENDENT_AMBULATORY_CARE_PROVIDER_SITE_OTHER): Payer: Medicare Other

## 2020-06-14 ENCOUNTER — Other Ambulatory Visit: Payer: Self-pay

## 2020-06-14 DIAGNOSIS — I4891 Unspecified atrial fibrillation: Secondary | ICD-10-CM | POA: Diagnosis not present

## 2020-06-14 DIAGNOSIS — Z953 Presence of xenogenic heart valve: Secondary | ICD-10-CM

## 2020-06-14 DIAGNOSIS — I824Y9 Acute embolism and thrombosis of unspecified deep veins of unspecified proximal lower extremity: Secondary | ICD-10-CM

## 2020-06-14 DIAGNOSIS — Z5181 Encounter for therapeutic drug level monitoring: Secondary | ICD-10-CM | POA: Diagnosis not present

## 2020-06-14 DIAGNOSIS — D6859 Other primary thrombophilia: Secondary | ICD-10-CM | POA: Diagnosis not present

## 2020-06-14 LAB — POCT INR: INR: 2 (ref 2.0–3.0)

## 2020-06-14 NOTE — Patient Instructions (Signed)
Continue taking Warfarin 1/2 tablet daily except 1 tablet on Mondays. Recheck INR in 8 weeks. Please call [phone] with any new medications or if scheduled for any procedures.

## 2020-06-29 ENCOUNTER — Ambulatory Visit (INDEPENDENT_AMBULATORY_CARE_PROVIDER_SITE_OTHER): Payer: Medicare Other

## 2020-06-29 DIAGNOSIS — I459 Conduction disorder, unspecified: Secondary | ICD-10-CM

## 2020-06-29 LAB — CUP PACEART REMOTE DEVICE CHECK
Battery Remaining Longevity: 150 mo
Battery Remaining Percentage: 100 %
Brady Statistic RA Percent Paced: 48 %
Brady Statistic RV Percent Paced: 41 %
Date Time Interrogation Session: 20220426070900
Implantable Lead Implant Date: 20201005
Implantable Lead Implant Date: 20201005
Implantable Lead Location: 753859
Implantable Lead Location: 753860
Implantable Lead Model: 7742
Implantable Lead Model: 7841
Implantable Lead Serial Number: 1017357
Implantable Lead Serial Number: 1068132
Implantable Pulse Generator Implant Date: 20201005
Lead Channel Impedance Value: 575 Ohm
Lead Channel Impedance Value: 717 Ohm
Lead Channel Pacing Threshold Amplitude: 0.9 V
Lead Channel Pacing Threshold Pulse Width: 0.4 ms
Lead Channel Setting Pacing Amplitude: 2.3 V
Lead Channel Setting Pacing Amplitude: 2.4 V
Lead Channel Setting Pacing Pulse Width: 0.4 ms
Lead Channel Setting Sensing Sensitivity: 1.5 mV
Pulse Gen Serial Number: 890709

## 2020-07-06 DIAGNOSIS — I4891 Unspecified atrial fibrillation: Secondary | ICD-10-CM | POA: Diagnosis not present

## 2020-07-06 DIAGNOSIS — Z1389 Encounter for screening for other disorder: Secondary | ICD-10-CM | POA: Diagnosis not present

## 2020-07-06 DIAGNOSIS — S0083XA Contusion of other part of head, initial encounter: Secondary | ICD-10-CM | POA: Diagnosis not present

## 2020-07-06 DIAGNOSIS — W19XXXA Unspecified fall, initial encounter: Secondary | ICD-10-CM | POA: Diagnosis not present

## 2020-07-06 DIAGNOSIS — Z8673 Personal history of transient ischemic attack (TIA), and cerebral infarction without residual deficits: Secondary | ICD-10-CM | POA: Diagnosis not present

## 2020-07-06 DIAGNOSIS — Z0000 Encounter for general adult medical examination without abnormal findings: Secondary | ICD-10-CM | POA: Diagnosis not present

## 2020-07-06 DIAGNOSIS — Z950 Presence of cardiac pacemaker: Secondary | ICD-10-CM | POA: Diagnosis not present

## 2020-07-06 DIAGNOSIS — I2510 Atherosclerotic heart disease of native coronary artery without angina pectoris: Secondary | ICD-10-CM | POA: Diagnosis not present

## 2020-07-06 DIAGNOSIS — I119 Hypertensive heart disease without heart failure: Secondary | ICD-10-CM | POA: Diagnosis not present

## 2020-07-06 DIAGNOSIS — E7800 Pure hypercholesterolemia, unspecified: Secondary | ICD-10-CM | POA: Diagnosis not present

## 2020-07-06 DIAGNOSIS — R7309 Other abnormal glucose: Secondary | ICD-10-CM | POA: Diagnosis not present

## 2020-07-19 NOTE — Progress Notes (Signed)
Remote pacemaker transmission.   

## 2020-08-09 ENCOUNTER — Other Ambulatory Visit: Payer: Self-pay

## 2020-08-09 ENCOUNTER — Ambulatory Visit: Payer: Medicare Other

## 2020-08-09 DIAGNOSIS — Z953 Presence of xenogenic heart valve: Secondary | ICD-10-CM | POA: Diagnosis not present

## 2020-08-09 DIAGNOSIS — I4891 Unspecified atrial fibrillation: Secondary | ICD-10-CM

## 2020-08-09 DIAGNOSIS — D6859 Other primary thrombophilia: Secondary | ICD-10-CM | POA: Diagnosis not present

## 2020-08-09 DIAGNOSIS — Z5181 Encounter for therapeutic drug level monitoring: Secondary | ICD-10-CM

## 2020-08-09 DIAGNOSIS — I824Y9 Acute embolism and thrombosis of unspecified deep veins of unspecified proximal lower extremity: Secondary | ICD-10-CM

## 2020-08-09 LAB — POCT INR: INR: 2.3 (ref 2.0–3.0)

## 2020-08-09 NOTE — Patient Instructions (Signed)
Continue taking Warfarin 1/2 tablet daily except 1 tablet on Mondays. Recheck INR in 8 weeks. Please call [phone] with any new medications or if scheduled for any procedures.

## 2020-08-10 ENCOUNTER — Other Ambulatory Visit: Payer: Self-pay | Admitting: Internal Medicine

## 2020-09-28 ENCOUNTER — Ambulatory Visit (INDEPENDENT_AMBULATORY_CARE_PROVIDER_SITE_OTHER): Payer: Medicare Other

## 2020-09-28 DIAGNOSIS — I495 Sick sinus syndrome: Secondary | ICD-10-CM

## 2020-09-28 LAB — CUP PACEART REMOTE DEVICE CHECK
Battery Remaining Longevity: 138 mo
Battery Remaining Percentage: 100 %
Brady Statistic RA Percent Paced: 45 %
Brady Statistic RV Percent Paced: 46 %
Date Time Interrogation Session: 20220726034100
Implantable Lead Implant Date: 20201005
Implantable Lead Implant Date: 20201005
Implantable Lead Location: 753859
Implantable Lead Location: 753860
Implantable Lead Model: 7742
Implantable Lead Model: 7841
Implantable Lead Serial Number: 1017357
Implantable Lead Serial Number: 1068132
Implantable Pulse Generator Implant Date: 20201005
Lead Channel Impedance Value: 576 Ohm
Lead Channel Impedance Value: 773 Ohm
Lead Channel Pacing Threshold Amplitude: 0.9 V
Lead Channel Pacing Threshold Pulse Width: 0.4 ms
Lead Channel Setting Pacing Amplitude: 2.3 V
Lead Channel Setting Pacing Amplitude: 2.4 V
Lead Channel Setting Pacing Pulse Width: 0.4 ms
Lead Channel Setting Sensing Sensitivity: 1.5 mV
Pulse Gen Serial Number: 890709

## 2020-10-04 ENCOUNTER — Other Ambulatory Visit: Payer: Self-pay

## 2020-10-04 ENCOUNTER — Ambulatory Visit: Payer: Medicare Other

## 2020-10-04 DIAGNOSIS — D6859 Other primary thrombophilia: Secondary | ICD-10-CM | POA: Diagnosis not present

## 2020-10-04 DIAGNOSIS — I4891 Unspecified atrial fibrillation: Secondary | ICD-10-CM

## 2020-10-04 DIAGNOSIS — Z953 Presence of xenogenic heart valve: Secondary | ICD-10-CM | POA: Diagnosis not present

## 2020-10-04 DIAGNOSIS — I824Y9 Acute embolism and thrombosis of unspecified deep veins of unspecified proximal lower extremity: Secondary | ICD-10-CM | POA: Diagnosis not present

## 2020-10-04 DIAGNOSIS — Z5181 Encounter for therapeutic drug level monitoring: Secondary | ICD-10-CM

## 2020-10-04 LAB — POCT INR: INR: 2.1 (ref 2.0–3.0)

## 2020-10-04 NOTE — Patient Instructions (Signed)
Description   Continue taking Warfarin 1/2 tablet daily except 1 tablet on Mondays. Recheck INR in 8 weeks. Please call [phone] with any new medications or if scheduled for any procedures.

## 2020-10-22 NOTE — Progress Notes (Signed)
Remote pacemaker transmission.   

## 2020-11-26 DIAGNOSIS — X32XXXD Exposure to sunlight, subsequent encounter: Secondary | ICD-10-CM | POA: Diagnosis not present

## 2020-11-26 DIAGNOSIS — D225 Melanocytic nevi of trunk: Secondary | ICD-10-CM | POA: Diagnosis not present

## 2020-11-26 DIAGNOSIS — C44319 Basal cell carcinoma of skin of other parts of face: Secondary | ICD-10-CM | POA: Diagnosis not present

## 2020-11-26 DIAGNOSIS — L570 Actinic keratosis: Secondary | ICD-10-CM | POA: Diagnosis not present

## 2020-11-29 ENCOUNTER — Ambulatory Visit: Payer: Medicare Other

## 2020-11-29 ENCOUNTER — Other Ambulatory Visit: Payer: Self-pay

## 2020-11-29 DIAGNOSIS — I4891 Unspecified atrial fibrillation: Secondary | ICD-10-CM | POA: Diagnosis not present

## 2020-11-29 DIAGNOSIS — D6859 Other primary thrombophilia: Secondary | ICD-10-CM

## 2020-11-29 DIAGNOSIS — Z5181 Encounter for therapeutic drug level monitoring: Secondary | ICD-10-CM

## 2020-11-29 DIAGNOSIS — I824Y9 Acute embolism and thrombosis of unspecified deep veins of unspecified proximal lower extremity: Secondary | ICD-10-CM

## 2020-11-29 DIAGNOSIS — Z953 Presence of xenogenic heart valve: Secondary | ICD-10-CM | POA: Diagnosis not present

## 2020-11-29 LAB — POCT INR: INR: 2.4 (ref 2.0–3.0)

## 2020-11-29 NOTE — Patient Instructions (Signed)
Continue taking Warfarin 1/2 tablet daily except 1 tablet on Mondays. Recheck INR in 8 weeks. Please call [phone] with any new medications or if scheduled for any procedures.

## 2020-12-28 ENCOUNTER — Ambulatory Visit (INDEPENDENT_AMBULATORY_CARE_PROVIDER_SITE_OTHER): Payer: Medicare Other

## 2020-12-28 DIAGNOSIS — I495 Sick sinus syndrome: Secondary | ICD-10-CM | POA: Diagnosis not present

## 2020-12-28 LAB — CUP PACEART REMOTE DEVICE CHECK
Battery Remaining Longevity: 138 mo
Battery Remaining Percentage: 100 %
Brady Statistic RA Percent Paced: 43 %
Brady Statistic RV Percent Paced: 49 %
Date Time Interrogation Session: 20221025045900
Implantable Lead Implant Date: 20201005
Implantable Lead Implant Date: 20201005
Implantable Lead Location: 753859
Implantable Lead Location: 753860
Implantable Lead Model: 7742
Implantable Lead Model: 7841
Implantable Lead Serial Number: 1017357
Implantable Lead Serial Number: 1068132
Implantable Pulse Generator Implant Date: 20201005
Lead Channel Impedance Value: 560 Ohm
Lead Channel Impedance Value: 760 Ohm
Lead Channel Setting Pacing Amplitude: 2.3 V
Lead Channel Setting Pacing Amplitude: 2.4 V
Lead Channel Setting Pacing Pulse Width: 0.4 ms
Lead Channel Setting Sensing Sensitivity: 1.5 mV
Pulse Gen Serial Number: 890709

## 2020-12-31 DIAGNOSIS — Z85828 Personal history of other malignant neoplasm of skin: Secondary | ICD-10-CM | POA: Diagnosis not present

## 2020-12-31 DIAGNOSIS — Z08 Encounter for follow-up examination after completed treatment for malignant neoplasm: Secondary | ICD-10-CM | POA: Diagnosis not present

## 2021-01-05 NOTE — Progress Notes (Signed)
Remote pacemaker transmission.   

## 2021-01-06 ENCOUNTER — Other Ambulatory Visit: Payer: Self-pay | Admitting: Family Medicine

## 2021-01-06 DIAGNOSIS — I119 Hypertensive heart disease without heart failure: Secondary | ICD-10-CM | POA: Diagnosis not present

## 2021-01-06 DIAGNOSIS — Z950 Presence of cardiac pacemaker: Secondary | ICD-10-CM | POA: Diagnosis not present

## 2021-01-06 DIAGNOSIS — E7800 Pure hypercholesterolemia, unspecified: Secondary | ICD-10-CM | POA: Diagnosis not present

## 2021-01-06 DIAGNOSIS — Z23 Encounter for immunization: Secondary | ICD-10-CM | POA: Diagnosis not present

## 2021-01-06 DIAGNOSIS — M79604 Pain in right leg: Secondary | ICD-10-CM

## 2021-01-06 DIAGNOSIS — I739 Peripheral vascular disease, unspecified: Secondary | ICD-10-CM

## 2021-01-06 DIAGNOSIS — I4891 Unspecified atrial fibrillation: Secondary | ICD-10-CM | POA: Diagnosis not present

## 2021-01-06 DIAGNOSIS — Z8673 Personal history of transient ischemic attack (TIA), and cerebral infarction without residual deficits: Secondary | ICD-10-CM | POA: Diagnosis not present

## 2021-01-06 DIAGNOSIS — R7309 Other abnormal glucose: Secondary | ICD-10-CM | POA: Diagnosis not present

## 2021-01-06 DIAGNOSIS — M79606 Pain in leg, unspecified: Secondary | ICD-10-CM | POA: Diagnosis not present

## 2021-01-06 DIAGNOSIS — I2510 Atherosclerotic heart disease of native coronary artery without angina pectoris: Secondary | ICD-10-CM | POA: Diagnosis not present

## 2021-01-06 DIAGNOSIS — K219 Gastro-esophageal reflux disease without esophagitis: Secondary | ICD-10-CM | POA: Diagnosis not present

## 2021-01-06 DIAGNOSIS — D6869 Other thrombophilia: Secondary | ICD-10-CM | POA: Diagnosis not present

## 2021-01-10 DIAGNOSIS — M6281 Muscle weakness (generalized): Secondary | ICD-10-CM | POA: Diagnosis not present

## 2021-01-10 DIAGNOSIS — R262 Difficulty in walking, not elsewhere classified: Secondary | ICD-10-CM | POA: Diagnosis not present

## 2021-01-10 DIAGNOSIS — R260 Ataxic gait: Secondary | ICD-10-CM | POA: Diagnosis not present

## 2021-01-11 ENCOUNTER — Ambulatory Visit
Admission: RE | Admit: 2021-01-11 | Discharge: 2021-01-11 | Disposition: A | Discharge status: Home / Self Care | Payer: Medicare Other | Source: Ambulatory Visit | Attending: Family Medicine | Admitting: Family Medicine

## 2021-01-11 DIAGNOSIS — M79605 Pain in left leg: Secondary | ICD-10-CM

## 2021-01-11 DIAGNOSIS — M79604 Pain in right leg: Secondary | ICD-10-CM

## 2021-01-11 DIAGNOSIS — M79661 Pain in right lower leg: Secondary | ICD-10-CM | POA: Diagnosis not present

## 2021-01-11 DIAGNOSIS — I739 Peripheral vascular disease, unspecified: Secondary | ICD-10-CM

## 2021-01-11 DIAGNOSIS — M79662 Pain in left lower leg: Secondary | ICD-10-CM | POA: Diagnosis not present

## 2021-01-14 DIAGNOSIS — M6281 Muscle weakness (generalized): Secondary | ICD-10-CM | POA: Diagnosis not present

## 2021-01-14 DIAGNOSIS — R262 Difficulty in walking, not elsewhere classified: Secondary | ICD-10-CM | POA: Diagnosis not present

## 2021-01-14 DIAGNOSIS — R260 Ataxic gait: Secondary | ICD-10-CM | POA: Diagnosis not present

## 2021-01-17 DIAGNOSIS — R260 Ataxic gait: Secondary | ICD-10-CM | POA: Diagnosis not present

## 2021-01-17 DIAGNOSIS — R262 Difficulty in walking, not elsewhere classified: Secondary | ICD-10-CM | POA: Diagnosis not present

## 2021-01-17 DIAGNOSIS — M6281 Muscle weakness (generalized): Secondary | ICD-10-CM | POA: Diagnosis not present

## 2021-01-18 ENCOUNTER — Encounter: Payer: Self-pay | Admitting: Vascular Surgery

## 2021-01-18 ENCOUNTER — Other Ambulatory Visit: Payer: Self-pay

## 2021-01-18 ENCOUNTER — Ambulatory Visit: Payer: Medicare Other | Admitting: Vascular Surgery

## 2021-01-18 DIAGNOSIS — I70213 Atherosclerosis of native arteries of extremities with intermittent claudication, bilateral legs: Secondary | ICD-10-CM | POA: Diagnosis not present

## 2021-01-18 DIAGNOSIS — I70219 Atherosclerosis of native arteries of extremities with intermittent claudication, unspecified extremity: Secondary | ICD-10-CM | POA: Insufficient documentation

## 2021-01-18 NOTE — Progress Notes (Signed)
Patient name: Sean Brock MRN: [id number] DOB: 12-Apr-1934 Sex: male  REASON FOR CONSULT: Evaluate bilateral lower extremity claudication  HPI: Sean Brock is a 86 y.o. male, with history of coronary artery disease status post CABG with aortic valve replacement, previous stroke, hypertension, hyperlipidemia, former smoker that presents for evaluation of bilateral lower extremity claudication.  Patient states he used to walk 2 miles a day with his wife.  About 3 months ago he noticed that he was really unable to walk more than 100 feet at his normal pace.  He gets burning in his buttocks and thighs.  He does okay walking around his house but otherwise is very limited.  He denies any previous vascular interventions.  He is a former smoker with no active tobacco abuse at this time.  He is on Coumadin.  He did have ABIs on 01/11/2021 that were normal but monophasic waveforms throughout the femoral-popliteal tibial segments bilaterally suggesting underlying PAD.   Past Medical History:  Diagnosis Date   AORTIC STENOSIS    a. Echo (05/26/13):  Mod LVH, vigorous LVF, EF 65-70%, no RWMA, Gr 1 DD, severe AS (mean 40 mmHg) => s/p bioprosthetic AVR 07/2013   Arthritis    BACK   CAD (coronary artery disease)    a. Lexiscan myoview (5/13) with EF 70%, no ischemia or infarction;   b. LHC (06/2013):  Dist LM 30%, ostial LAD 40-50%, prox LAD 30%, ostial D1 80-90%, prox CFX 40%, RCA occluded with L-R collats => CABG (S-PDA at time of AVR)   CVA (cerebral infarction)    a. 3/14 right parietal infarct. Carotid US 3/14 showed no significant disease. He has an implanted loop recorder to look for atrial fibrillation.    DYSPEPSIA    ED (erectile dysfunction)    Former smoker    GERD (gastroesophageal reflux disease)    History of kidney stones    HYPERLIPIDEMIA    HYPERTENSION    Palpitations    a. Event monitor (5/13) with occasional PVCs, PACs but no significant arrhythmia.    S/P aortic valve  replacement with bioprosthetic valve + CABG x1 07/11/2013   23 mm [facility] Ease bovine pericardial tissue valve;  Echo (08/2013):  EF 55-60%, no RWMA, AVR ok, Asc Ao 40 mm (mildly dilated), mild LAE, mild RVE, lipomatous hypertrophy, mod TR, PASP 36 mmHg   S/P CABG x 1 07/11/2013   SVG to PDA with [facility] via right thigh   Sinus bradycardia    a. 2013 - HR 40's in the office. BB stopped.   Trigeminal neuropathy    Vertigo     Past Surgical History:  Procedure Laterality Date   A-FLUTTER ABLATION N/A 11/13/2018   Procedure: A-FLUTTER ABLATION;  Surgeon: Evans Lance, MD;  Location: [place] [facility];  Service: Cardiovascular;  Laterality: N/A;   AORTIC VALVE REPLACEMENT N/A 07/11/2013   Procedure: AORTIC VALVE REPLACEMENT (AVR);  Surgeon: [name], MD;  Location: [place];  Service: Open Heart Surgery;  Laterality: N/A;   CATARACT EXTRACTION     CORONARY ARTERY BYPASS GRAFT N/A 07/11/2013   Procedure: CORONARY ARTERY BYPASS GRAFTING (CABG) x1;  Surgeon: [name], MD;  Location: [place];  Service: Open Heart Surgery;  Laterality: N/A;   INGUINAL HERNIA REPAIR Right 01/15/2013   Procedure: HERNIA REPAIR INGUINAL ADULT;  Surgeon: Harl Bowie, MD;  Location: [place];  Service: General;  Laterality: Right;   INSERTION OF MESH Right 01/15/2013  Procedure: INSERTION OF MESH;  Surgeon: Harl Bowie, MD;  Location: [place];  Service: General;  Laterality: Right;   INTRAOPERATIVE TRANSESOPHAGEAL ECHOCARDIOGRAM N/A 07/11/2013   Procedure: INTRAOPERATIVE TRANSESOPHAGEAL ECHOCARDIOGRAM;  Surgeon: [name], MD;  Location: [place];  Service: Open Heart Surgery;  Laterality: N/A;   LEFT AND RIGHT HEART CATHETERIZATION WITH CORONARY ANGIOGRAM N/A 06/06/2013   Procedure: LEFT AND RIGHT HEART CATHETERIZATION WITH CORONARY ANGIOGRAM;  Surgeon: Larey Dresser, MD;  Location[facility] C[facility];  Service: Cardiovascular;  Laterality: N/A;   LITHOTRIPSY     LOOP  RECORDER IMPLANT  04/15/2013   MDT LinQ implanred by Dr Lovena Le for cryptogenic stroke   LOOP RECORDER IMPLANT N/A 04/14/2013   Procedure: LOOP RECORDER IMPLANT;  Surgeon: Evans Lance, MD;  Location: [facility] CATH LAB;  Service: Cardiovascular;  Laterality: N/A;   LOOP RECORDER REMOVAL N/A 11/13/2018   Procedure: LOOP RECORDER REMOVAL;  Surgeon: Evans Lance, MD;  Location: [place] [facility];  Service: Cardiovascular;  Laterality: N/A;   MOUTH SURGERY     PACEMAKER IMPLANT N/A 12/09/2018   Procedure: PACEMAKER IMPLANT;  Surgeon: Evans Lance, MD;  Location: [place] [facility];  Service: Cardiovascular;  Laterality: N/A;    Family History  Problem Relation Age of Onset   Diabetes Mother    Heart attack Mother    Heart attack Father    Hypertension Father    Diabetes Unknown    Hypertension Unknown    Lung cancer Unknown    Cancer Sister        breast   Stroke Neg Hx     SOCIAL HISTORY: Social History   Socioeconomic History   Marital status: Married    Spouse name: Not on file   Number of children: Not on file   Years of education: Not on file   Highest education level: Not on file  Occupational History   Not on file  Tobacco Use   Smoking status: Former    Packs/day: 1.00    Years: 30.00    Pack years: 30.00    Types: Cigarettes    Quit date: 08/29/2009    Years since quitting: 11.3   Smokeless tobacco: Never  Substance and Sexual Activity   Alcohol use: No   Drug use: No   Sexual activity: Not Currently  Other Topics Concern   Not on file  Social History Narrative   He resides in [place] with his wife and son. 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  No    grandchildren. He smokes half a pack per day intermittently for the last 40    years. He drinks a glass of homemade wine mixed with diet Coke one time per    week. He denies any drugs, herbal medication, diet, or exercise program.         Social Determinants of Health    Financial Resource Strain: Not on file  Food Insecurity: Not on file  Transportation Needs: Not on file  Physical Activity: Not on file  Stress: Not on file  Social Connections: Not on file  Intimate Partner Violence: Not on file    Allergies  Allergen Reactions   Sudafed [Pseudoephedrine Hcl] Palpitations   Cardizem [Diltiazem Hcl] Rash    Current Outpatient Medications  Medication Sig Dispense Refill   acetaminophen (TYLENOL) 500 MG tablet Take 500 mg by mouth every 6 (six) hours  as needed for mild pain.      aspirin EC 81 MG EC tablet Take 1 tablet (81 mg total) by mouth daily.     carvedilol (COREG) 3.125 MG tablet Take 1 tablet (3.125 mg total) by mouth 2 (two) times daily. 180 tablet 3   chlorzoxazone (PARAFON) 500 MG tablet Take 500 mg by mouth 4 (four) times daily as needed for muscle spasms.     famotidine (PEPCID) 20 MG tablet Take 20 mg by mouth 2 (two) times daily.     meclizine (ANTIVERT) 25 MG tablet Take 25 mg by mouth 3 (three) times daily as needed for dizziness.     Multiple Vitamins-Minerals (STRESS B COMPLEX/ZINC PO) Take 1 tablet by mouth 2 (two) times a week.      Polyethyl Glycol-Propyl Glycol (SYSTANE OP) Place 1 drop into both eyes daily as needed (for dry eyes).      polyethylene glycol (MIRALAX / GLYCOLAX) 17 g packet Take 17 g by mouth daily as needed (constipation.).     rosuvastatin (CRESTOR) 10 MG tablet Take 10 mg by mouth every evening.      tamsulosin (FLOMAX) 0.4 MG CAPS capsule Take 0.4 mg by mouth every evening.      warfarin (COUMADIN) 5 MG tablet TAKE AS DIRECTED PER  COUMADIN  CLINIC 70 tablet 1   No current facility-administered medications for this visit.    REVIEW OF SYSTEMS:  [X]  denotes positive finding, [ ]  denotes negative finding Cardiac  Comments:  Chest pain or chest pressure:    Shortness of breath upon exertion:    Short of breath when lying flat:    Irregular heart rhythm:        Vascular    Pain in calf, thigh, or hip  brought on by ambulation: x   Pain in feet at night that wakes you up from your sleep:     Blood clot in your veins:    Leg swelling:         Pulmonary    Oxygen at home:    Productive cough:     Wheezing:         Neurologic    Sudden weakness in arms or legs:     Sudden numbness in arms or legs:     Sudden onset of difficulty speaking or slurred speech:    Temporary loss of vision in one eye:     Problems with dizziness:         Gastrointestinal    Blood in stool:     Vomited blood:         Genitourinary    Burning when urinating:     Blood in urine:        Psychiatric    Major depression:         Hematologic    Bleeding problems:    Problems with blood clotting too easily:        Skin    Rashes or ulcers:        Constitutional    Fever or chills:      PHYSICAL EXAM: Vitals:   01/18/21 0843  BP: 136/83  Pulse: 60  Resp: 18  Temp: 98.1 F (36.7 C)  TempSrc: Temporal  SpO2: 99%  Weight: 198 lb 8 oz (90 kg)  Height: 6\' 3"  (1.905 m)    GENERAL: The patient is a well-nourished male, in no acute distress. The vital signs are documented above. CARDIAC: There is a regular rate and  rhythm.  VASCULAR:  Palpable femoral pulses bilaterally No palpable pedal pulses No active tissue loss PULMONARY: No respiratory distress. ABDOMEN: Soft and non-tender. MUSCULOSKELETAL: There are no major deformities or cyanosis. NEUROLOGIC: No focal weakness or paresthesias are detected. SKIN: There are no ulcers or rashes noted. PSYCHIATRIC: The patient has a normal affect.  DATA:   ABIs on 01/11/2021 are 1.06 on the right and 1.16 on the left but monophasic waveforms throughout bilateral lower extremities  Assessment/Plan:  86 year old male with multiple medical comorbidities that presents for evaluation of bilateral lower extremity claudication.  His symptoms do sound consistent with vascular claudication.  Sounds like he was walking up to 2 miles a day and is now walking  less than 100 feet at his normal pace with significant bilateral lower extremity symptoms.  He does have femoral pulses on exam, but I cannot feel any pedal pulses.  His arterial study showed monophasic waveforms bilateral lower extremities which is abnormal.  I discussed claudication is not a limb threatening situation and that we typically manage this conservatively until it becomes lifestyle limiting.  He does feel very debilitated by this and wants to proceed with intervention.  Discussed transfemoral access with aortogram, lower extremity arteriogram, and possible intervention.  We will schedule for 02/03/2021.  He will need to be bridged off of his Coumadin and will defer to the Coumadin clinic for likely lovenox bridge.  Risks and benefits discussed.   Marty Heck, MD [facility] Office: [phone]

## 2021-01-19 DIAGNOSIS — R260 Ataxic gait: Secondary | ICD-10-CM | POA: Diagnosis not present

## 2021-01-19 DIAGNOSIS — M6281 Muscle weakness (generalized): Secondary | ICD-10-CM | POA: Diagnosis not present

## 2021-01-19 DIAGNOSIS — R262 Difficulty in walking, not elsewhere classified: Secondary | ICD-10-CM | POA: Diagnosis not present

## 2021-01-20 ENCOUNTER — Telehealth: Payer: Self-pay | Admitting: *Deleted

## 2021-01-20 ENCOUNTER — Other Ambulatory Visit: Payer: Self-pay

## 2021-01-20 NOTE — Telephone Encounter (Addendum)
Patient with diagnosis of aflutter and VTE on warfarin for anticoagulation.    Procedure: ABDOMINAL AORTOGRAM W/LOWER EXTREMITY RUNOFF AND POSSIBLE INTERVENTION Date of procedure: 02/03/21  CHA2DS2-VASc Score = 6  This indicates a 9.7% annual risk of stroke. The patient's score is based upon: CHF History: 0 HTN History: 1 Diabetes History: 0 Stroke History: 2 Vascular Disease History: 1 Age Score: 2 Gender Score: 0   CrCl 67mL/min Platelets 207K  Request is to hold warfarin for 3 days prior to procedure. Pt will require bridging with Lovenox in order to hold warfarin for upcoming procedure. Will coordinate at next INR check at [facility] Coumadin clinic on 11/21.

## 2021-01-20 NOTE — Telephone Encounter (Signed)
Received a call from [facility] that the pt is having an Abdominal Aortogram on December 1st and they want him off his warfarin and bridged. Advised that this will require a clearance form to be faxed over for clearance. She states that they are not asking for permission but needed him to hold & bridge; advised that any procedure requires clearance from the Cardiologist since Warfarin is managed under Cardiology. She states they have a form with the required info they can send over and gave them the Pre-Op fax number to ensure they fax this over. She is aware this is our process when pt's have need to hold Warfarin & bridge. She was thankful & states will fax over.

## 2021-01-20 NOTE — Telephone Encounter (Signed)
   Pre-operative Risk Assessment    Patient Name: Sean Brock  DOB: [birth year] MRN: [id number]      Request for Surgical Clearance   Procedure:   ABDOMINAL AORTOGRAM W/LOWER EXTREMITY RUNOFF AND POSSIBLE INTERVENTION  Date of Surgery: Clearance 02/03/21                                 Surgeon:  DR. Monica Martinez Surgeon's Group or Practice Name:  [facility] Phone number:  [phone] Fax number:  [phone]   Type of Clearance Requested: - Medical  - Pharmacy:  Hold Warfarin (Coumadin) x 3 DAYS PRIOR TO PROCEDURE ; [place]   Type of Anesthesia:   Local    Additional requests/questions:   Jiles Prows   01/20/2021, 2:56 PM

## 2021-01-21 NOTE — Telephone Encounter (Signed)
Primary Cardiologist:Gregg Lovena Le, MD  Chart reviewed as part of pre-operative protocol coverage. Because of Sean Brock's past medical history and time since last visit, he/she will require a follow-up visit in order to better assess preoperative cardiovascular risk.  Pre-op covering staff: - Please schedule appointment and call patient to inform them. - Please contact requesting surgeon's office via preferred method (i.e, phone, fax) to inform them of need for appointment prior to surgery.  If applicable, this message will also be routed to pharmacy pool and/or primary cardiologist for input on holding anticoagulant/antiplatelet agent as requested below so that this information is available at time of patient's appointment.   Deberah Pelton, NP  01/21/2021, 7:53 AM

## 2021-01-21 NOTE — Telephone Encounter (Signed)
I will send a message to Dr. Tanna Furry scheduler [place] to reach out to the pt with an appt with Dr. Lovena Le or EP APP.

## 2021-01-24 ENCOUNTER — Other Ambulatory Visit: Payer: Self-pay

## 2021-01-24 ENCOUNTER — Ambulatory Visit: Payer: Medicare Other

## 2021-01-24 VITALS — Wt 195.4 lb

## 2021-01-24 DIAGNOSIS — D6859 Other primary thrombophilia: Secondary | ICD-10-CM

## 2021-01-24 DIAGNOSIS — Z5181 Encounter for therapeutic drug level monitoring: Secondary | ICD-10-CM

## 2021-01-24 DIAGNOSIS — I824Y9 Acute embolism and thrombosis of unspecified deep veins of unspecified proximal lower extremity: Secondary | ICD-10-CM

## 2021-01-24 DIAGNOSIS — Z953 Presence of xenogenic heart valve: Secondary | ICD-10-CM

## 2021-01-24 DIAGNOSIS — I4891 Unspecified atrial fibrillation: Secondary | ICD-10-CM | POA: Diagnosis not present

## 2021-01-24 DIAGNOSIS — R262 Difficulty in walking, not elsewhere classified: Secondary | ICD-10-CM | POA: Diagnosis not present

## 2021-01-24 DIAGNOSIS — M6281 Muscle weakness (generalized): Secondary | ICD-10-CM | POA: Diagnosis not present

## 2021-01-24 DIAGNOSIS — R260 Ataxic gait: Secondary | ICD-10-CM | POA: Diagnosis not present

## 2021-01-24 LAB — POCT INR: INR: 2.6 (ref 2.0–3.0)

## 2021-01-24 MED ORDER — ENOXAPARIN SODIUM 80 MG/0.8ML IJ SOSY: 80.0000 mg | PREFILLED_SYRINGE | Freq: Two times a day (BID) | INTRAMUSCULAR | 0 refills | Status: DC

## 2021-01-24 NOTE — Telephone Encounter (Signed)
Pt has appt 02/01/21 for pre op clearance with Tommye Standard, PAC. I will forward notes to [facility] for upcoming appt. Will send FYI to requesting office pt has appt 02/01/21.

## 2021-01-24 NOTE — Patient Instructions (Addendum)
-   Continue taking Warfarin 1/2 tablet daily except 1 tablet on Mondays.  - Follow instructions for Lovenox bridge  Sunday, 11/27: Last dose of warfarin.  Monday, 11/28: No warfarin or enoxaparin (Lovenox).  Tuesday, 11/29: Inject enoxaparin 80 mg in the fatty abdominal tissue at least 2 inches from the belly button twice a day about 12 hours apart, 8am and 8pm rotate sites. No warfarin.  Wednesday, 11/30: Inject enoxaparin in the fatty tissue in the morning at 8 am (No PM dose). No warfarin.  Thursday, 12/1: Procedure Day - No enoxaparin - Resume warfarin in the evening or as directed by doctor (take an extra half tablet with usual dose for 2 days then resume normal dose).  Friday, 12/2: Resume enoxaparin inject in the fatty tissue every 12 hours and take warfarin w/ extra 1/2 tablet  Saturday, 12/3: Inject enoxaparin in the fatty tissue every 12 hours and take warfarin  Sunday, 12/4: Inject enoxaparin in the fatty tissue every 12 hours and take warfarin  Monday, 12/5: Inject enoxaparin in the fatty tissue every 12 hours and take warfarin  Tuesday, 12/6: Inject enoxaparin in the fatty tissue every 12 hours and take warfarin  Wednesday,12/7: warfarin appt to check INR.

## 2021-01-24 NOTE — Progress Notes (Signed)
eno

## 2021-01-31 DIAGNOSIS — M6281 Muscle weakness (generalized): Secondary | ICD-10-CM | POA: Diagnosis not present

## 2021-01-31 DIAGNOSIS — R262 Difficulty in walking, not elsewhere classified: Secondary | ICD-10-CM | POA: Diagnosis not present

## 2021-01-31 DIAGNOSIS — R260 Ataxic gait: Secondary | ICD-10-CM | POA: Diagnosis not present

## 2021-01-31 NOTE — Progress Notes (Signed)
Cardiology Office Note Date:  01/31/2021  Patient ID:  Sean Brock, Sean Brock 1934/12/30, MRN [id number] PCP:  Gaynelle Arabian, MD  Electrophysiologist: Dr. Lovena Le     Chief Complaint:  pre-op  History of Present Illness: Sean Brock is a 86 y.o. male with history of CABG (CABG 2015), AVR (bioprosthetic, 2015), AFlutter (ablated 11/13/2018), sinus node dysfunction w/PPM, stroke, HTN, HLD  He comes today to be seen for Dr. Lovena Le, last seen by him jan 2021, occ dizziness, minimal palpitations, no symptoms otherwise, no changes were made.  He saw [facility] for evaluation of escalating b/l symptoms of claudication, ABIs on 01/11/2021 are 1.06 on the right and 1.16 on the left but monophasic waveforms throughout bilateral lower extremities, planned for transfemoral access with aortogram, lower extremity arteriogram, and possible intervention.  Pending Abdominal aortogram/runoff possible intervention, vascular requested hold of warfarin and bridge, our [facility] has addressed, planned to hold 3 days, bridge with lovenox was planned at his 11/21 visit  Does not appear that they are requesting cardiology/medical clearance.   TODAY He comes accompanied by his wife He denies any cardiac concerns, says he could walk and do whatever he wanted if it wer not for the pain in his legs. No CP, palpitations or cardiac awareness No  dizzy spells, near syncope or syncope Warfarin management has been good, no wide swings No bleeding or signs of bleeding  They are clear on the lovenox instructions with no questions    Device information BSCi dual chamber PPM implanted 12/09/2018   Past Medical History:  Diagnosis Date   AORTIC STENOSIS    a. Echo (05/26/13):  Mod LVH, vigorous LVF, EF 65-70%, no RWMA, Gr 1 DD, severe AS (mean 40 mmHg) => s/p bioprosthetic AVR 07/2013   Arthritis    BACK   CAD (coronary artery disease)    a. Lexiscan myoview (5/13) with EF 70%, no ischemia or infarction;   b. LHC (06/2013):   Dist LM 30%, ostial LAD 40-50%, prox LAD 30%, ostial D1 80-90%, prox CFX 40%, RCA occluded with L-R collats => CABG (S-PDA at time of AVR)   CVA (cerebral infarction)    a. 3/14 right parietal infarct. Carotid US 3/14 showed no significant disease. He has an implanted loop recorder to look for atrial fibrillation.    DYSPEPSIA    ED (erectile dysfunction)    Former smoker    GERD (gastroesophageal reflux disease)    History of kidney stones    HYPERLIPIDEMIA    HYPERTENSION    Palpitations    a. Event monitor (5/13) with occasional PVCs, PACs but no significant arrhythmia.    S/P aortic valve replacement with bioprosthetic valve + CABG x1 07/11/2013   23 mm [facility] Ease bovine pericardial tissue valve;  Echo (08/2013):  EF 55-60%, no RWMA, AVR ok, Asc Ao 40 mm (mildly dilated), mild LAE, mild RVE, lipomatous hypertrophy, mod TR, PASP 36 mmHg   S/P CABG x 1 07/11/2013   SVG to PDA with [facility] via right thigh   Sinus bradycardia    a. 2013 - HR 40's in the office. BB stopped.   Trigeminal neuropathy    Vertigo     Past Surgical History:  Procedure Laterality Date   A-FLUTTER ABLATION N/A 11/13/2018   Procedure: A-FLUTTER ABLATION;  Surgeon: Evans Lance, MD;  Location: [place] [facility];  Service: Cardiovascular;  Laterality: N/A;   AORTIC VALVE REPLACEMENT N/A 07/11/2013   Procedure: AORTIC VALVE REPLACEMENT (AVR);  Surgeon:  [name], MD;  Location: [place];  Service: Open Heart Surgery;  Laterality: N/A;   CATARACT EXTRACTION     CORONARY ARTERY BYPASS GRAFT N/A 07/11/2013   Procedure: CORONARY ARTERY BYPASS GRAFTING (CABG) x1;  Surgeon: [name], MD;  Location: [place];  Service: Open Heart Surgery;  Laterality: N/A;   INGUINAL HERNIA REPAIR Right 01/15/2013   Procedure: HERNIA REPAIR INGUINAL ADULT;  Surgeon: Harl Bowie, MD;  Location: [place];  Service: General;  Laterality: Right;   INSERTION OF MESH Right 01/15/2013   Procedure: INSERTION OF  MESH;  Surgeon: Harl Bowie, MD;  Location: [place];  Service: General;  Laterality: Right;   INTRAOPERATIVE TRANSESOPHAGEAL ECHOCARDIOGRAM N/A 07/11/2013   Procedure: INTRAOPERATIVE TRANSESOPHAGEAL ECHOCARDIOGRAM;  Surgeon: [name], MD;  Location: [place];  Service: Open Heart Surgery;  Laterality: N/A;   LEFT AND RIGHT HEART CATHETERIZATION WITH CORONARY ANGIOGRAM N/A 06/06/2013   Procedure: LEFT AND RIGHT HEART CATHETERIZATION WITH CORONARY ANGIOGRAM;  Surgeon: Larey Dresser, MD;  Location: [facility];  Service: Cardiovascular;  Laterality: N/A;   LITHOTRIPSY     LOOP RECORDER IMPLANT  04/15/2013   MDT LinQ implanred by Dr Lovena Le for cryptogenic stroke   LOOP RECORDER IMPLANT N/A 04/14/2013   Procedure: LOOP RECORDER IMPLANT;  Surgeon: Evans Lance, MD;  Location: [facility];  Service: Cardiovascular;  Laterality: N/A;   LOOP RECORDER REMOVAL N/A 11/13/2018   Procedure: LOOP RECORDER REMOVAL;  Surgeon: Evans Lance, MD;  Location: [place] [facility];  Service: Cardiovascular;  Laterality: N/A;   MOUTH SURGERY     PACEMAKER IMPLANT N/A 12/09/2018   Procedure: PACEMAKER IMPLANT;  Surgeon: Evans Lance, MD;  Location: [place] [facility];  Service: Cardiovascular;  Laterality: N/A;    Current Outpatient Medications  Medication Sig Dispense Refill   acetaminophen (TYLENOL) 500 MG tablet Take 500 mg by mouth every 6 (six) hours as needed for mild pain.      aspirin EC 81 MG EC tablet Take 1 tablet (81 mg total) by mouth daily.     b complex vitamins capsule Take 1 capsule by mouth daily.     Carboxymethylcellul-Glycerin (CLEAR EYES FOR DRY EYES OP) Place 1 drop into both eyes daily as needed (Dry eyes).     carvedilol (COREG) 3.125 MG tablet Take 1 tablet (3.125 mg total) by mouth 2 (two) times daily. 180 tablet 3   chlorzoxazone (PARAFON) 500 MG tablet Take 500 mg by mouth 3 (three) times daily as needed for muscle spasms.     Emollient (MEDERMA AG FACE EX)  Apply 1 application topically daily as needed (on face for skin cancer).     enoxaparin (LOVENOX) 80 MG/0.8ML injection Inject 0.8 mLs (80 mg total) into the skin every 12 (twelve) hours for 9 days. 14.4 mL 0   famotidine (PEPCID) 20 MG tablet Take 20 mg by mouth 2 (two) times daily.     meclizine (ANTIVERT) 25 MG tablet Take 25 mg by mouth 3 (three) times daily as needed for dizziness.     polyethylene glycol (MIRALAX / GLYCOLAX) 17 g packet Take 17 g by mouth daily as needed (constipation.).     rosuvastatin (CRESTOR) 10 MG tablet Take 10 mg by mouth every evening.      tamsulosin (FLOMAX) 0.4 MG CAPS capsule Take 0.4 mg by mouth every evening.      warfarin (COUMADIN) 5 MG tablet TAKE AS DIRECTED  PER  COUMADIN  CLINIC (Patient taking differently: Take 2.5-5 mg by mouth See admin instructions. Take 5 mg Monday, all the other days take 2.5 mg in the evening) 70 tablet 1   No current facility-administered medications for this visit.    Allergies:   Sudafed [pseudoephedrine hcl] and Cardizem [diltiazem hcl]   Social History:  The patient  reports that he quit smoking about 11 years ago. His smoking use included cigarettes. He has a 30.00 pack-year smoking history. He has never used smokeless tobacco. He reports that he does not drink alcohol and does not use drugs.   Family History:  The patient's family history includes Cancer in his sister; Diabetes in his mother and unknown relative; Heart attack in his father and mother; Hypertension in his father and unknown relative; Lung cancer in his unknown relative.  ROS:  Please see the history of present illness.    All other systems are reviewed and otherwise negative.   PHYSICAL EXAM:  VS:  There were no vitals taken for this visit. BMI: There is no height or weight on file to calculate BMI. Well nourished, well developed, in no acute distress HEENT: normocephalic, atraumatic Neck: no JVD, carotid bruits or masses Cardiac:  RRR; no significant  murmurs, no rubs, or gallops Lungs:  CTA b/l, no wheezing, rhonchi or rales Abd: soft, nontender MS: no deformity or atrophy Ext: no edema Skin: warm and dry, no rash Neuro:  No gross deficits appreciated Psych: euthymic mood, full affect  PPM site is stable, no tethering or discomfort, devise is prominent though no thinning   EKG:  Done today and reviewed by myself shows  A paced, V sensing, 61bpm QRS is broader then last though no significant changes  Device interrogation done today and reviewed by myself:  Battery and lead measurements are good Interestingly with A lead thresholds provokes very brief ATach episodes that he is aware of I had clear capture at 1.3/0.4 and 1.0/1.0 With programmed outputs at 2.6/0.4 AND 2.0/1.0 provokes the AT, these are not PMT are slower then MTR This ONLY occurs at increased A lead outputs I linked in industry rep Lead data/trends are stable  His wife recalls after implant of his device that he had a terrible time with palpitations and even hospitalized his device programmed numerous times until finally the settings found that worked wel for him He has a >1V safety margin on his A lead (unchanged from last check) V lead outputs increased for 2:1 margin AP 42% VP 50%   11/13/2018: EPA/ablation CONCLUSIONS:  1. Isthmus-dependent right atrial flutter upon presentation.  2. Successful radiofrequency ablation of atrial flutter along the cavotricuspid isthmus with complete bidirectional isthmus block achieved utilizing 3D electroanatomic mapping to guide ablation.  3. No inducible arrhythmias following ablation.  4. Successful removal of a medtronic ILR 5. No early apparent complications.    08/25/2013: TTE Study Conclusions  - Left ventricle: The cavity size was normal. Systolic function was    normal. The estimated ejection fraction was in the range of 55%    to 60%. Wall motion was normal; there were no regional wall    motion abnormalities.   - Aortic valve: A bioprosthesis was present and functioning    normally.  - Aorta: Ascending aortic diameter: 40 mm (S).  - Ascending aorta: The ascending aorta was mildly dilated.  - Mitral valve: Calcified annulus. There was trivial regurgitation.  - Left atrium: The atrium was mildly dilated.  - Right ventricle:  The cavity size was mildly dilated. Wall    thickness was normal.  - Atrial septum: There was increased thickness of the septum,    consistent with lipomatous hypertrophy.  - Tricuspid valve: There was moderate regurgitation.  - Pulmonary arteries: PA peak pressure: 36 mm Hg (S).    Recent Labs: No results found for requested labs within last 8760 hours.  No results found for requested labs within last 8760 hours.   CrCl cannot be calculated (Patient's most recent lab result is older than the maximum 21 days allowed.).   Wt Readings from Last 3 Encounters:  01/24/21 195 lb 6.4 oz (88.6 kg)  01/18/21 198 lb 8 oz (90 kg)  03/13/19 202 lb 6.4 oz (91.8 kg)     Other studies reviewed: Additional studies/records reviewed today include: summarized above  ASSESSMENT AND PLAN:  CAD No anginal symptoms On ASA, BB statin  AVR (bioprsothetic) No symptoms Soft SM only on exam   PPM As discussed above   HTN Looks OK  HLD Not addressed today  AFlutter Ablated On warfarin Lovenox bridging planned for his procedure  7.   Pre-op Percutaneous procedure planned Discussed with Dr. Lovena Le In absence of any symptoms, can clear without pre-procedure testing   Disposition: F/u with remotes as usual, in clinic in 64mo, sooner if needed, will plan to update his echo at he next visit  Current medicines are reviewed at length with the patient today.  The patient did not have any concerns regarding medicines.  Venetia Night, PA-C 01/31/2021 6:33 PM     [place] [address] [phone] (office)  [phone] (fax)

## 2021-02-01 ENCOUNTER — Ambulatory Visit: Payer: Medicare Other | Admitting: Physician Assistant

## 2021-02-01 ENCOUNTER — Other Ambulatory Visit: Payer: Self-pay

## 2021-02-01 ENCOUNTER — Encounter: Payer: Self-pay | Admitting: Physician Assistant

## 2021-02-01 VITALS — BP 130/68 | HR 61 | Ht 75.0 in | Wt 198.0 lb

## 2021-02-01 DIAGNOSIS — I4891 Unspecified atrial fibrillation: Secondary | ICD-10-CM | POA: Diagnosis not present

## 2021-02-01 DIAGNOSIS — Z950 Presence of cardiac pacemaker: Secondary | ICD-10-CM

## 2021-02-01 DIAGNOSIS — Z01818 Encounter for other preprocedural examination: Secondary | ICD-10-CM | POA: Diagnosis not present

## 2021-02-01 DIAGNOSIS — I2510 Atherosclerotic heart disease of native coronary artery without angina pectoris: Secondary | ICD-10-CM | POA: Diagnosis not present

## 2021-02-01 DIAGNOSIS — I10 Essential (primary) hypertension: Secondary | ICD-10-CM | POA: Diagnosis not present

## 2021-02-01 DIAGNOSIS — I4892 Unspecified atrial flutter: Secondary | ICD-10-CM | POA: Diagnosis not present

## 2021-02-01 DIAGNOSIS — Z952 Presence of prosthetic heart valve: Secondary | ICD-10-CM

## 2021-02-01 LAB — CUP PACEART INCLINIC DEVICE CHECK
Date Time Interrogation Session: 20221129183906
Implantable Lead Implant Date: 20201005
Implantable Lead Implant Date: 20201005
Implantable Lead Location: 753859
Implantable Lead Location: 753860
Implantable Lead Model: 7742
Implantable Lead Model: 7841
Implantable Lead Serial Number: 1017357
Implantable Lead Serial Number: 1068132
Implantable Pulse Generator Implant Date: 20201005
Lead Channel Pacing Threshold Amplitude: 1.3 V
Lead Channel Pacing Threshold Amplitude: 1.3 V
Lead Channel Pacing Threshold Pulse Width: 0.4 ms
Lead Channel Pacing Threshold Pulse Width: 0.4 ms
Lead Channel Sensing Intrinsic Amplitude: 11.8 mV
Lead Channel Sensing Intrinsic Amplitude: 4.6 mV
Pulse Gen Serial Number: 890709

## 2021-02-01 NOTE — Patient Instructions (Addendum)
Medication Instructions:   Your physician recommends that you continue on your current medications as directed. Please refer to the Current Medication list given to you today.   *If you need a refill on your cardiac medications before your next appointment, please call your pharmacy*   Lab Work: [place]   If you have labs (blood work) drawn today and your tests are completely normal, you will receive your results only by: [place] (if you have MyChart) OR A paper copy in the mail If you have any lab test that is abnormal or we need to change your treatment, we will call you to review the results.   Testing/Procedures: NONE ORDERED  TODAY    Follow-Up: At [facility], you and your health needs are our priority.  As part of our continuing mission to provide you with exceptional heart care, we have created designated Provider Care Teams.  These Care Teams include your primary Cardiologist (physician) and Advanced Practice Providers (APPs -  Physician Assistants and Nurse Practitioners) who all work together to provide you with the care you need, when you need it.  We recommend signing up for the patient portal called "MyChart".  Sign up information is provided on this After Visit Summary.  MyChart is used to connect with patients for Virtual Visits (Telemedicine).  Patients are able to view lab/test results, encounter notes, upcoming appointments, etc.  Non-urgent messages can be sent to your provider as well.   To learn more about what you can do with MyChart, go to [email or web address].    Your next appointment:   6 month(s)  The format for your next appointment:   In Person  Provider:   You may see  Dr. Lovena Le  or one of the following Advanced Practice Providers on your designated Care Team:   Tommye Standard, [place] Legrand Como "Jonni Sanger" Chalmers Cater, [place]    Other Instructions

## 2021-02-03 ENCOUNTER — Ambulatory Visit (HOSPITAL_COMMUNITY)
Admission: RE | Admit: 2021-02-03 | Discharge: 2021-02-03 | Disposition: A | Discharge status: Home / Self Care | Payer: Medicare Other | Attending: Vascular Surgery | Admitting: Vascular Surgery

## 2021-02-03 ENCOUNTER — Encounter (HOSPITAL_COMMUNITY)
Admission: RE | Disposition: A | Discharge status: Home / Self Care | Payer: Self-pay | Source: Home / Self Care | Attending: Vascular Surgery

## 2021-02-03 ENCOUNTER — Encounter (HOSPITAL_COMMUNITY): Payer: Self-pay | Admitting: Vascular Surgery

## 2021-02-03 ENCOUNTER — Other Ambulatory Visit: Payer: Self-pay

## 2021-02-03 DIAGNOSIS — E785 Hyperlipidemia, unspecified: Secondary | ICD-10-CM | POA: Insufficient documentation

## 2021-02-03 DIAGNOSIS — I10 Essential (primary) hypertension: Secondary | ICD-10-CM | POA: Diagnosis not present

## 2021-02-03 DIAGNOSIS — Z7901 Long term (current) use of anticoagulants: Secondary | ICD-10-CM | POA: Diagnosis not present

## 2021-02-03 DIAGNOSIS — Z951 Presence of aortocoronary bypass graft: Secondary | ICD-10-CM | POA: Insufficient documentation

## 2021-02-03 DIAGNOSIS — I70213 Atherosclerosis of native arteries of extremities with intermittent claudication, bilateral legs: Secondary | ICD-10-CM | POA: Insufficient documentation

## 2021-02-03 DIAGNOSIS — Z87891 Personal history of nicotine dependence: Secondary | ICD-10-CM | POA: Insufficient documentation

## 2021-02-03 DIAGNOSIS — I2510 Atherosclerotic heart disease of native coronary artery without angina pectoris: Secondary | ICD-10-CM | POA: Insufficient documentation

## 2021-02-03 DIAGNOSIS — Z952 Presence of prosthetic heart valve: Secondary | ICD-10-CM | POA: Insufficient documentation

## 2021-02-03 HISTORY — PX: PERIPHERAL VASCULAR INTERVENTION: CATH118257

## 2021-02-03 HISTORY — PX: ABDOMINAL AORTOGRAM W/LOWER EXTREMITY: CATH118223

## 2021-02-03 LAB — POCT I-STAT, CHEM 8
BUN: 17 mg/dL (ref 8–23)
Calcium, Ion: 1.16 mmol/L (ref 1.15–1.40)
Chloride: 105 mmol/L (ref 98–111)
Creatinine, Ser: 0.8 mg/dL (ref 0.61–1.24)
Glucose, Bld: 102 mg/dL — ABNORMAL HIGH (ref 70–99)
HCT: 39 % (ref 39.0–52.0)
Hemoglobin: 13.3 g/dL (ref 13.0–17.0)
Potassium: 4.3 mmol/L (ref 3.5–5.1)
Sodium: 141 mmol/L (ref 135–145)
TCO2: 27 mmol/L (ref 22–32)

## 2021-02-03 LAB — PROTIME-INR
INR: 1.7 — ABNORMAL HIGH (ref 0.8–1.2)
Prothrombin Time: 19.8 seconds — ABNORMAL HIGH (ref 11.4–15.2)

## 2021-02-03 SURGERY — ABDOMINAL AORTOGRAM W/LOWER EXTREMITY
Anesthesia: LOCAL

## 2021-02-03 MED ORDER — HEPARIN SODIUM (PORCINE) 1000 UNIT/ML IJ SOLN
INTRAMUSCULAR | Status: DC | PRN
  Administered 2021-02-03: 9000 [IU] via INTRAVENOUS

## 2021-02-03 MED ORDER — FENTANYL CITRATE (PF) 100 MCG/2ML IJ SOLN
INTRAMUSCULAR | Status: AC
  Filled 2021-02-03: qty 2

## 2021-02-03 MED ORDER — LIDOCAINE HCL (PF) 1 % IJ SOLN
INTRAMUSCULAR | Status: DC | PRN
  Administered 2021-02-03: 15 mL

## 2021-02-03 MED ORDER — MIDAZOLAM HCL 2 MG/2ML IJ SOLN
INTRAMUSCULAR | Status: AC
  Filled 2021-02-03: qty 2

## 2021-02-03 MED ORDER — HEPARIN (PORCINE) IN NACL 1000-0.9 UT/500ML-% IV SOLN
INTRAVENOUS | Status: DC | PRN
  Administered 2021-02-03 (×2): 500 mL

## 2021-02-03 MED ORDER — SODIUM CHLORIDE 0.9 % IV SOLN: INTRAVENOUS | Status: DC

## 2021-02-03 MED ORDER — IODIXANOL 320 MG/ML IV SOLN
INTRAVENOUS | Status: DC | PRN
  Administered 2021-02-03: 190 mL via INTRA_ARTERIAL

## 2021-02-03 MED ORDER — MIDAZOLAM HCL 2 MG/2ML IJ SOLN
INTRAMUSCULAR | Status: DC | PRN
  Administered 2021-02-03: 1 mg via INTRAVENOUS

## 2021-02-03 MED ORDER — FENTANYL CITRATE (PF) 100 MCG/2ML IJ SOLN
INTRAMUSCULAR | Status: DC | PRN
  Administered 2021-02-03: 25 ug via INTRAVENOUS

## 2021-02-03 MED ORDER — LIDOCAINE HCL (PF) 1 % IJ SOLN
INTRAMUSCULAR | Status: AC
  Filled 2021-02-03: qty 30

## 2021-02-03 MED ORDER — HEPARIN SODIUM (PORCINE) 1000 UNIT/ML IJ SOLN
INTRAMUSCULAR | Status: AC
  Filled 2021-02-03: qty 10

## 2021-02-03 MED ORDER — HEPARIN (PORCINE) IN NACL 1000-0.9 UT/500ML-% IV SOLN
INTRAVENOUS | Status: AC
  Filled 2021-02-03: qty 1000

## 2021-02-03 SURGICAL SUPPLY — 19 items
BALLN MUSTANG 4X150X135 (BALLOONS) ×3
BALLN MUSTANG 5X150X135 (BALLOONS) ×3
BALLOON MUSTANG 4X150X135 (BALLOONS) ×2 IMPLANT
BALLOON MUSTANG 5X150X135 (BALLOONS) ×2 IMPLANT
CATH OMNI FLUSH 5F 65CM (CATHETERS) ×3 IMPLANT
CATH QUICKCROSS SUPP .035X90CM (MICROCATHETER) ×3 IMPLANT
DEVICE CLOSURE MYNXGRIP 6/7F (Vascular Products) ×3 IMPLANT
GLIDEWIRE ADV .035X260CM (WIRE) ×3 IMPLANT
KIT MICROPUNCTURE NIT STIFF (SHEATH) ×3 IMPLANT
KIT PV (KITS) ×3 IMPLANT
SHEATH HIGHFLEX ANSEL 6FRX55 (SHEATH) ×3 IMPLANT
SHEATH PINNACLE 5F 10CM (SHEATH) ×3 IMPLANT
SHEATH PINNACLE 6F 10CM (SHEATH) ×3 IMPLANT
STENT ELUVIA 6X100X130 (Permanent Stent) ×3 IMPLANT
STENT ELUVIA 6X150X130 (Permanent Stent) ×3 IMPLANT
SYR MEDRAD MARK V 150ML (SYRINGE) ×3 IMPLANT
TRANSDUCER W/STOPCOCK (MISCELLANEOUS) ×3 IMPLANT
TRAY PV CATH (CUSTOM PROCEDURE TRAY) ×3 IMPLANT
WIRE BENTSON .035X145CM (WIRE) ×3 IMPLANT

## 2021-02-03 NOTE — H&P (Signed)
History and Physical Interval Note:  02/03/2021 11:06 AM  Sean Brock  has presented today for surgery, with the diagnosis of claudication.  The various methods of treatment have been discussed with the patient and family. After consideration of risks, benefits and other options for treatment, the patient has consented to  Procedure(s): ABDOMINAL AORTOGRAM W/LOWER EXTREMITY (N/A) as a surgical intervention.  The patient's history has been reviewed, patient examined, no change in status, stable for surgery.  I have reviewed the patient's chart and labs.  Questions were answered to the patient's satisfaction.     Marty Heck  Patient name: Sean Brock         MRN: [id number]        DOB: [birth year]          Sex: male   REASON FOR CONSULT: Evaluate bilateral lower extremity claudication   HPI: Sean Brock is a 86 y.o. male, with history of coronary artery disease status post CABG with aortic valve replacement, previous stroke, hypertension, hyperlipidemia, former smoker that presents for evaluation of bilateral lower extremity claudication.  Patient states he used to walk 2 miles a day with his wife.  About 3 months ago he noticed that he was really unable to walk more than 100 feet at his normal pace.  He gets burning in his buttocks and thighs.  He does okay walking around his house but otherwise is very limited.  He denies any previous vascular interventions.  He is a former smoker with no active tobacco abuse at this time.  He is on Coumadin.  He did have ABIs on 01/11/2021 that were normal but monophasic waveforms throughout the femoral-popliteal tibial segments bilaterally suggesting underlying PAD.        Past Medical History:  Diagnosis Date   AORTIC STENOSIS      a. Echo (05/26/13):  Mod LVH, vigorous LVF, EF 65-70%, no RWMA, Gr 1 DD, severe AS (mean 40 mmHg) => s/p bioprosthetic AVR 07/2013   Arthritis      BACK   CAD (coronary artery disease)      a. Lexiscan myoview  (5/13) with EF 70%, no ischemia or infarction;   b. LHC (06/2013):  Dist LM 30%, ostial LAD 40-50%, prox LAD 30%, ostial D1 80-90%, prox CFX 40%, RCA occluded with L-R collats => CABG (S-PDA at time of AVR)   CVA (cerebral infarction)      a. 3/14 right parietal infarct. Carotid US 3/14 showed no significant disease. He has an implanted loop recorder to look for atrial fibrillation.    DYSPEPSIA     ED (erectile dysfunction)     Former smoker     GERD (gastroesophageal reflux disease)     History of kidney stones     HYPERLIPIDEMIA     HYPERTENSION     Palpitations      a. Event monitor (5/13) with occasional PVCs, PACs but no significant arrhythmia.    S/P aortic valve replacement with bioprosthetic valve + CABG x1 07/11/2013    23 mm [facility] Ease bovine pericardial tissue valve;  Echo (08/2013):  EF 55-60%, no RWMA, AVR ok, Asc Ao 40 mm (mildly dilated), mild LAE, mild RVE, lipomatous hypertrophy, mod TR, PASP 36 mmHg   S/P CABG x 1 07/11/2013    SVG to PDA with [facility] via right thigh   Sinus bradycardia      a. 2013 - HR 40's in the office. BB stopped.   Trigeminal neuropathy  Vertigo             Past Surgical History:  Procedure Laterality Date   A-FLUTTER ABLATION N/A 11/13/2018    Procedure: A-FLUTTER ABLATION;  Surgeon: Evans Lance, MD;  Location: [place] [facility];  Service: Cardiovascular;  Laterality: N/A;   AORTIC VALVE REPLACEMENT N/A 07/11/2013    Procedure: AORTIC VALVE REPLACEMENT (AVR);  Surgeon: [name], MD;  Location: [place];  Service: Open Heart Surgery;  Laterality: N/A;   CATARACT EXTRACTION       CORONARY ARTERY BYPASS GRAFT N/A 07/11/2013    Procedure: CORONARY ARTERY BYPASS GRAFTING (CABG) x1;  Surgeon: [name], MD;  Location: [place];  Service: Open Heart Surgery;  Laterality: N/A;   INGUINAL HERNIA REPAIR Right 01/15/2013    Procedure: HERNIA REPAIR INGUINAL ADULT;  Surgeon: Harl Bowie, MD;  Location: [place];  Service:  General;  Laterality: Right;   INSERTION OF MESH Right 01/15/2013    Procedure: INSERTION OF MESH;  Surgeon: Harl Bowie, MD;  Location: [place];  Service: General;  Laterality: Right;   INTRAOPERATIVE TRANSESOPHAGEAL ECHOCARDIOGRAM N/A 07/11/2013    Procedure: INTRAOPERATIVE TRANSESOPHAGEAL ECHOCARDIOGRAM;  Surgeon: [name], MD;  Location: [place];  Service: Open Heart Surgery;  Laterality: N/A;   LEFT AND RIGHT HEART CATHETERIZATION WITH CORONARY ANGIOGRAM N/A 06/06/2013    Procedure: LEFT AND RIGHT HEART CATHETERIZATION WITH CORONARY ANGIOGRAM;  Surgeon: Larey Dresser, MD;  Location: [facility];  Service: Cardiovascular;  Laterality: N/A;   LITHOTRIPSY       LOOP RECORDER IMPLANT   04/15/2013    MDT LinQ implanred by Dr Lovena Le for cryptogenic stroke   LOOP RECORDER IMPLANT N/A 04/14/2013    Procedure: LOOP RECORDER IMPLANT;  Surgeon: Evans Lance, MD;  Location: [facility] CATH LAB;  Service: Cardiovascular;  Laterality: N/A;   LOOP RECORDER REMOVAL N/A 11/13/2018    Procedure: LOOP RECORDER REMOVAL;  Surgeon: Evans Lance, MD;  Location: [place] [facility];  Service: Cardiovascular;  Laterality: N/A;   MOUTH SURGERY       PACEMAKER IMPLANT N/A 12/09/2018    Procedure: PACEMAKER IMPLANT;  Surgeon: Evans Lance, MD;  Location: [place] [facility];  Service: Cardiovascular;  Laterality: N/A;           Family History  Problem Relation Age of Onset   Diabetes Mother     Heart attack Mother     Heart attack Father     Hypertension Father     Diabetes Unknown     Hypertension Unknown     Lung cancer Unknown     Cancer Sister          breast   Stroke Neg Hx        SOCIAL HISTORY: Social History         Socioeconomic History   Marital status: Married      Spouse name: Not on file   Number of children: Not on file   Years of education: Not on file   Highest education level: Not on file  Occupational History   Not on file  Tobacco Use   Smoking status:  Former      Packs/day: 1.00      Years: 30.00      Pack years: 30.00      Types: Cigarettes      Quit date: 08/29/2009      Years since quitting: 68.3  Smokeless tobacco: Never  Substance and Sexual Activity   Alcohol use: No   Drug use: No   Sexual activity: Not Currently  Other Topics Concern   Not on file  Social History Narrative    He resides in [place] with his wife and son. He is employed     as a Art gallery manager at [employer]. 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  No     grandchildren. He smokes half a pack per day intermittently for the last 40     years. He drinks a glass of homemade wine mixed with diet Coke one time per     week. He denies any drugs, herbal medication, diet, or exercise program.              Social Determinants of Health    Financial Resource Strain: Not on file  Food Insecurity: Not on file  Transportation Needs: Not on file  Physical Activity: Not on file  Stress: Not on file  Social Connections: Not on file  Intimate Partner Violence: Not on file          Allergies  Allergen Reactions   Sudafed [Pseudoephedrine Hcl] Palpitations   Cardizem [Diltiazem Hcl] Rash            Current Outpatient Medications  Medication Sig Dispense Refill   acetaminophen (TYLENOL) 500 MG tablet Take 500 mg by mouth every 6 (six) hours as needed for mild pain.        aspirin EC 81 MG EC tablet Take 1 tablet (81 mg total) by mouth daily.       carvedilol (COREG) 3.125 MG tablet Take 1 tablet (3.125 mg total) by mouth 2 (two) times daily. 180 tablet 3   chlorzoxazone (PARAFON) 500 MG tablet Take 500 mg by mouth 4 (four) times daily as needed for muscle spasms.       famotidine (PEPCID) 20 MG tablet Take 20 mg by mouth 2 (two) times daily.       meclizine (ANTIVERT) 25 MG tablet Take 25 mg by mouth 3 (three) times daily as needed for dizziness.       Multiple Vitamins-Minerals (STRESS B COMPLEX/ZINC PO) Take 1 tablet by mouth 2 (two) times a week.        Polyethyl  Glycol-Propyl Glycol (SYSTANE OP) Place 1 drop into both eyes daily as needed (for dry eyes).        polyethylene glycol (MIRALAX / GLYCOLAX) 17 g packet Take 17 g by mouth daily as needed (constipation.).       rosuvastatin (CRESTOR) 10 MG tablet Take 10 mg by mouth every evening.        tamsulosin (FLOMAX) 0.4 MG CAPS capsule Take 0.4 mg by mouth every evening.        warfarin (COUMADIN) 5 MG tablet TAKE AS DIRECTED PER  COUMADIN  CLINIC 70 tablet 1    No current facility-administered medications for this visit.      REVIEW OF SYSTEMS:  [X]  denotes positive finding, [ ]  denotes negative finding Cardiac   Comments:  Chest pain or chest pressure:      Shortness of breath upon exertion:      Short of breath when lying flat:      Irregular heart rhythm:             Vascular      Pain in calf, thigh, or hip brought on by ambulation: x    Pain in feet at night that wakes  you up from your sleep:       Blood clot in your veins:      Leg swelling:              Pulmonary      Oxygen at home:      Productive cough:       Wheezing:              Neurologic      Sudden weakness in arms or legs:       Sudden numbness in arms or legs:       Sudden onset of difficulty speaking or slurred speech:      Temporary loss of vision in one eye:       Problems with dizziness:              Gastrointestinal      Blood in stool:       Vomited blood:              Genitourinary      Burning when urinating:       Blood in urine:             Psychiatric      Major depression:              Hematologic      Bleeding problems:      Problems with blood clotting too easily:             Skin      Rashes or ulcers:             Constitutional      Fever or chills:          PHYSICAL EXAM:    Vitals:    01/18/21 0843  BP: 136/83  Pulse: 60  Resp: 18  Temp: 98.1 F (36.7 C)  TempSrc: Temporal  SpO2: 99%  Weight: 198 lb 8 oz (90 kg)  Height: 6\' 3"  (1.905 m)      GENERAL: The patient is a  well-nourished male, in no acute distress. The vital signs are documented above. CARDIAC: There is a regular rate and rhythm.  VASCULAR:  Palpable femoral pulses bilaterally No palpable pedal pulses No active tissue loss PULMONARY: No respiratory distress. ABDOMEN: Soft and non-tender. MUSCULOSKELETAL: There are no major deformities or cyanosis. NEUROLOGIC: No focal weakness or paresthesias are detected. SKIN: There are no ulcers or rashes noted. PSYCHIATRIC: The patient has a normal affect.   DATA:    ABIs on 01/11/2021 are 1.06 on the right and 1.16 on the left but monophasic waveforms throughout bilateral lower extremities   Assessment/Plan:   86 year old male with multiple medical comorbidities that presents for evaluation of bilateral lower extremity claudication.  His symptoms do sound consistent with vascular claudication.  Sounds like he was walking up to 2 miles a day and is now walking less than 100 feet at his normal pace with significant bilateral lower extremity symptoms.  He does have femoral pulses on exam, but I cannot feel any pedal pulses.  His arterial study showed monophasic waveforms bilateral lower extremities which is abnormal.  I discussed claudication is not a limb threatening situation and that we typically manage this conservatively until it becomes lifestyle limiting.  He does feel very debilitated by this and wants to proceed with intervention.  Discussed transfemoral access with aortogram, lower extremity arteriogram, and possible intervention.  We will schedule for 02/03/2021.  He will need to be bridged  off of his Coumadin and will defer to the Coumadin clinic for likely lovenox bridge.  Risks and benefits discussed.     Marty Heck, MD [facility] [place] Office: [phone]

## 2021-02-03 NOTE — Op Note (Signed)
Patient name: Sean Brock MRN: [id number] DOB: 09/17/1934 Sex: male  02/03/2021 Pre-operative Diagnosis: Bilateral lower extremity short distance lifestyle limiting claudication Post-operative diagnosis:  Same Surgeon:  Marty Heck, MD Procedure Performed: 1.  Ultrasound-guided access right common femoral artery 2.  Aortogram with catheter selection of aorta 3.  Bilateral lower extremity arteriogram with runoff 4.  Left SFA and above-knee popliteal angioplasty with stent placement (predilated with 4 mm and 5 mm Mustang, stented with 6 mm x 150 mm Eluvia and 6 mm x 100 mm Eluvia, all stents postdilated with 5 mm Mustang) 5.  Mynx closure of the right common femoral artery 6.  59 minutes of monitored moderate conscious sedation time  Indications: Patient is an 86 year old male seen with bilateral lower extremity lifestyle limiting claudication.  He presents today for aortogram, bilateral lower extremity arteriogram after risk benefits discussed.  Initial focus will be on treatment of the left leg.  Findings: Aortogram showed patent renal arteries bilaterally and a patent infrarenal aorta with patent bilateral iliac arteries.  Both of his common iliac arteries do appear ectatic versus small aneurysm.  Left lower extremity runoff showed a patent common femoral, profunda, with a diffusely diseased SFA.  The mid to distal SFA occluded at Hunter's canal and he reconstituted above-knee popliteal artery.  He has very calcified vessels.  Two-vessel runoff in the peroneal and posterior tibial artery.  The left SFA above-knee popliteal occlusion was then crossed antegrade and we primarily stented this with a 6 mm x 150 mm drug coated Eluvia and 6 mm x 100 mm drug coated Eluvia all postdilated with 5 mm balloon.  No significant residual stenosis.  Right lower extremity arteriogram showed a high-grade focal stenosis in the proximal SFA with a diffusely diseased SFA with multiple focal high  grade stenosis >80%.  Two vessel runoff in peroneal and posterior tibial.  The posterior tibial has a focal high grade stenosis proximally.   Procedure:  The patient was identified in the holding area and taken to room 8.  The patient was then placed supine on the table and prepped and draped in the usual sterile fashion.  A time out was called.  Ultrasound was used to evaluate the right common femoral artery.  It was patent .  A digital ultrasound image was acquired.  A micropuncture needle was used to access the right common femoral artery under ultrasound guidance.  An 018 wire was advanced without resistance and a micropuncture sheath was placed.  The 018 wire was removed and a benson wire was placed.  The micropuncture sheath was exchanged for a 5 french sheath.  An omniflush catheter was advanced over the wire to the level of L-1.  An abdominal angiogram was obtained.  Next the catheter was pulled down and bilateral lower extremity runoff was obtained.  After evaluating images, elected to intervene on the left distal SFA above-knee popliteal occlusion.  I used a Glidewire advantage to cross the aortic bifurcation with Omni Flush catheter and got a wire into the left SFA.  Patient was given 100 units/kg IV heparin.  We advanced the long Ansell sheath in the right groin over the aortic bifurcation into the left SFA.  I then used a quick cross catheter with a Glidewire advantage to cross the distal SFA above-knee popliteal occlusion and confirmed with hand-injection I was back in the true lumen.  We then elected to primarily stent this.  I could not get the stent to track so  we had to predilate initially with a 4 mm balloon and still the stent would not track so we upsized to a 5 mm Mustang in the occluded segment of SFA.  Finally was able to get a stent into the above-knee popliteal artery that was deployed up into the SFA with a 6 mm x 150 mm Eluvia and then a second 6 mm x 100 mm Eluvia with appropriate  overlap.  The stents were then angioplastied with a 5 mm balloon.  Satisfied with results we had widely patent stent with preserved two-vessel runoff.  Wires and catheters were removed after we exchanged for a short 6 French sheath in the right groin.  A mynx closure device was deployed.  Plan: Patient will continue aspirin as well as restart his Lovenox bridge and Coumadin tomorrow.  He is followed by the Coumadin clinic.  I will see him in 1 month to see how he improves from his left leg intervention before making a decision about the right leg.  We will get noninvasive imaging at that time.   Marty Heck, MD [facility] [place] Office: [place]

## 2021-02-03 NOTE — Discharge Instructions (Addendum)
Patient can resume his Coumadin and Lovenox (for bridging) tomorrow on postop day 1.  I will arrange follow-up in 1 month.  He will also need aspirin and statin which he is already taking.

## 2021-02-09 ENCOUNTER — Other Ambulatory Visit: Payer: Self-pay

## 2021-02-09 ENCOUNTER — Ambulatory Visit: Payer: Medicare Other | Admitting: *Deleted

## 2021-02-09 DIAGNOSIS — Z5181 Encounter for therapeutic drug level monitoring: Secondary | ICD-10-CM | POA: Diagnosis not present

## 2021-02-09 DIAGNOSIS — I824Y9 Acute embolism and thrombosis of unspecified deep veins of unspecified proximal lower extremity: Secondary | ICD-10-CM

## 2021-02-09 DIAGNOSIS — Z953 Presence of xenogenic heart valve: Secondary | ICD-10-CM | POA: Diagnosis not present

## 2021-02-09 DIAGNOSIS — I4891 Unspecified atrial fibrillation: Secondary | ICD-10-CM | POA: Diagnosis not present

## 2021-02-09 DIAGNOSIS — D6859 Other primary thrombophilia: Secondary | ICD-10-CM

## 2021-02-09 LAB — POCT INR: INR: 1.9 — AB (ref 2.0–3.0)

## 2021-02-09 NOTE — Patient Instructions (Signed)
Description   -Take 1 more injection of lovenox tonight. -Take 1 tablet of warfarin today, then continue to take warfarin 1/2 a tablet daily except for 1 tablet on Mondays. Recheck INR in 1 week. Coumadin Clinic [phone].

## 2021-02-16 ENCOUNTER — Other Ambulatory Visit: Payer: Self-pay

## 2021-02-16 ENCOUNTER — Ambulatory Visit: Payer: Medicare Other | Admitting: *Deleted

## 2021-02-16 DIAGNOSIS — Z5181 Encounter for therapeutic drug level monitoring: Secondary | ICD-10-CM

## 2021-02-16 DIAGNOSIS — Z953 Presence of xenogenic heart valve: Secondary | ICD-10-CM | POA: Diagnosis not present

## 2021-02-16 DIAGNOSIS — I824Y9 Acute embolism and thrombosis of unspecified deep veins of unspecified proximal lower extremity: Secondary | ICD-10-CM

## 2021-02-16 DIAGNOSIS — I4891 Unspecified atrial fibrillation: Secondary | ICD-10-CM | POA: Diagnosis not present

## 2021-02-16 DIAGNOSIS — D6859 Other primary thrombophilia: Secondary | ICD-10-CM | POA: Diagnosis not present

## 2021-02-16 LAB — POCT INR: INR: 2.2 (ref 2.0–3.0)

## 2021-02-16 NOTE — Patient Instructions (Signed)
Description   Continue taking  warfarin 1/2 tablet daily except for 1 tablet on Mondays. Recheck INR in 3 weeks. Coumadin Clinic [phone].

## 2021-02-21 ENCOUNTER — Other Ambulatory Visit: Payer: Self-pay

## 2021-02-21 DIAGNOSIS — I70213 Atherosclerosis of native arteries of extremities with intermittent claudication, bilateral legs: Secondary | ICD-10-CM

## 2021-03-08 ENCOUNTER — Ambulatory Visit (INDEPENDENT_AMBULATORY_CARE_PROVIDER_SITE_OTHER): Payer: Medicare Other | Admitting: Vascular Surgery

## 2021-03-08 ENCOUNTER — Ambulatory Visit (HOSPITAL_COMMUNITY)
Admission: RE | Admit: 2021-03-08 | Discharge: 2021-03-08 | Disposition: A | Discharge status: Home / Self Care | Payer: Medicare Other | Source: Ambulatory Visit | Attending: Vascular Surgery | Admitting: Vascular Surgery

## 2021-03-08 ENCOUNTER — Ambulatory Visit (INDEPENDENT_AMBULATORY_CARE_PROVIDER_SITE_OTHER)
Admission: RE | Admit: 2021-03-08 | Discharge: 2021-03-08 | Disposition: A | Discharge status: Home / Self Care | Payer: Medicare Other | Source: Ambulatory Visit | Attending: Vascular Surgery | Admitting: Vascular Surgery

## 2021-03-08 ENCOUNTER — Encounter: Payer: Self-pay | Admitting: Vascular Surgery

## 2021-03-08 ENCOUNTER — Other Ambulatory Visit: Payer: Self-pay

## 2021-03-08 VITALS — BP 154/95 | HR 86 | Temp 97.9°F | Resp 18 | Ht 75.0 in | Wt 198.0 lb

## 2021-03-08 DIAGNOSIS — I70213 Atherosclerosis of native arteries of extremities with intermittent claudication, bilateral legs: Secondary | ICD-10-CM | POA: Diagnosis not present

## 2021-03-08 NOTE — Progress Notes (Signed)
Patient name: Sean Brock MRN: [id number] DOB: 1934/04/08 Sex: male  REASON FOR CONSULT: Follow-up after left lower extremity intervention  HPI: Sean Brock is a 87 y.o. male, with history of coronary artery disease status post CABG with aortic valve replacement on coumadin, previous stroke, hypertension, hyperlipidemia, former smoker that presents for follow-up after recent left leg intervention.  He was initially seen with bilateral lower extremity short distance lifestyle limiting claudication.  Over the last several months he was unable to walk more than about 100 feet. He did have ABIs on 01/11/2021 that were normal but monophasic waveforms throughout the femoral-popliteal tibial segments bilaterally suggesting underlying PAD.   On 02/03/2021 he underwent left SFA above-knee popliteal angioplasty with stenting for occlusion at [place] [facility].  He states his left leg is 100% better.  He is now having very little issues with the right leg as well.  He is on aspirin and Coumadin.  Past Medical History:  Diagnosis Date   AORTIC STENOSIS    a. Echo (05/26/13):  Mod LVH, vigorous LVF, EF 65-70%, no RWMA, Gr 1 DD, severe AS (mean 40 mmHg) => s/p bioprosthetic AVR 07/2013   Arthritis    BACK   CAD (coronary artery disease)    a. Lexiscan myoview (5/13) with EF 70%, no ischemia or infarction;   b. LHC (06/2013):  Dist LM 30%, ostial LAD 40-50%, prox LAD 30%, ostial D1 80-90%, prox CFX 40%, RCA occluded with L-R collats => CABG (S-PDA at time of AVR)   CVA (cerebral infarction)    a. 3/14 right parietal infarct. Carotid US 3/14 showed no significant disease. He has an implanted loop recorder to look for atrial fibrillation.    DYSPEPSIA    ED (erectile dysfunction)    Former smoker    GERD (gastroesophageal reflux disease)    History of kidney stones    HYPERLIPIDEMIA    HYPERTENSION    Palpitations    a. Event monitor (5/13) with occasional PVCs, PACs but no significant arrhythmia.     S/P aortic valve replacement with bioprosthetic valve + CABG x1 07/11/2013   23 mm [facility] Ease bovine pericardial tissue valve;  Echo (08/2013):  EF 55-60%, no RWMA, AVR ok, Asc Ao 40 mm (mildly dilated), mild LAE, mild RVE, lipomatous hypertrophy, mod TR, PASP 36 mmHg   S/P CABG x 1 07/11/2013   SVG to PDA with [facility] via right thigh   Sinus bradycardia    a. 2013 - HR 40's in the office. BB stopped.   Trigeminal neuropathy    Vertigo     Past Surgical History:  Procedure Laterality Date   A-FLUTTER ABLATION N/A 11/13/2018   Procedure: A-FLUTTER ABLATION;  Surgeon: Evans Lance, MD;  Location: [place] [facility];  Service: Cardiovascular;  Laterality: N/A;   ABDOMINAL AORTOGRAM W/LOWER EXTREMITY N/A 02/03/2021   Procedure: ABDOMINAL AORTOGRAM W/LOWER EXTREMITY;  Surgeon: Marty Heck, MD;  Location: [place] [facility];  Service: Cardiovascular;  Laterality: N/A;   AORTIC VALVE REPLACEMENT N/A 07/11/2013   Procedure: AORTIC VALVE REPLACEMENT (AVR);  Surgeon: [name], MD;  Location: [place];  Service: Open Heart Surgery;  Laterality: N/A;   CATARACT EXTRACTION     CORONARY ARTERY BYPASS GRAFT N/A 07/11/2013   Procedure: CORONARY ARTERY BYPASS GRAFTING (CABG) x1;  Surgeon: [name], MD;  Location: [place];  Service: Open Heart Surgery;  Laterality: N/A;   INGUINAL HERNIA REPAIR Right 01/15/2013   Procedure: HERNIA  REPAIR INGUINAL ADULT;  Surgeon: Harl Bowie, MD;  Location: [place];  Service: General;  Laterality: Right;   INSERTION OF MESH Right 01/15/2013   Procedure: INSERTION OF MESH;  Surgeon: Harl Bowie, MD;  Location: [place];  Service: General;  Laterality: Right;   INTRAOPERATIVE TRANSESOPHAGEAL ECHOCARDIOGRAM N/A 07/11/2013   Procedure: INTRAOPERATIVE TRANSESOPHAGEAL ECHOCARDIOGRAM;  Surgeon: [name], MD;  Location: [place];  Service: Open Heart Surgery;  Laterality: N/A;   LEFT AND RIGHT HEART CATHETERIZATION  WITH CORONARY ANGIOGRAM N/A 06/06/2013   Procedure: LEFT AND RIGHT HEART CATHETERIZATION WITH CORONARY ANGIOGRAM;  Surgeon: Larey Dresser, MD;  Location: [facility] CATH LAB;  Service: Cardiovascular;  Laterality: N/A;   LITHOTRIPSY     LOOP RECORDER IMPLANT  04/15/2013   MDT LinQ implanred by Dr Lovena Le for cryptogenic stroke   LOOP RECORDER IMPLANT N/A 04/14/2013   Procedure: LOOP RECORDER IMPLANT;  Surgeon: Evans Lance, MD;  Location: [facility] CATH LAB;  Service: Cardiovascular;  Laterality: N/A;   LOOP RECORDER REMOVAL N/A 11/13/2018   Procedure: LOOP RECORDER REMOVAL;  Surgeon: Evans Lance, MD;  Location: [place] [facility];  Service: Cardiovascular;  Laterality: N/A;   MOUTH SURGERY     PACEMAKER IMPLANT N/A 12/09/2018   Procedure: PACEMAKER IMPLANT;  Surgeon: Evans Lance, MD;  Location: [place] [facility];  Service: Cardiovascular;  Laterality: N/A;   PERIPHERAL VASCULAR INTERVENTION Left 02/03/2021   Procedure: PERIPHERAL VASCULAR INTERVENTION;  Surgeon: Marty Heck, MD;  Location: [place] [facility];  Service: Cardiovascular;  Laterality: Left;  sfa    Family History  Problem Relation Age of Onset   Diabetes Mother    Heart attack Mother    Heart attack Father    Hypertension Father    Diabetes Unknown    Hypertension Unknown    Lung cancer Unknown    Cancer Sister        breast   Stroke Neg Hx     SOCIAL HISTORY: Social History   Socioeconomic History   Marital status: Married    Spouse name: Not on file   Number of children: Not on file   Years of education: Not on file   Highest education level: Not on file  Occupational History   Not on file  Tobacco Use   Smoking status: Former    Packs/day: 1.00    Years: 30.00    Pack years: 30.00    Types: Cigarettes    Quit date: 08/29/2009    Years since quitting: 11.5   Smokeless tobacco: Never  Vaping Use   Vaping Use: Every day  Substance and Sexual Activity   Alcohol use: No   Drug use: No   Sexual activity:  Not Currently  Other Topics Concern   Not on file  Social History Narrative   He resides in [place] with his wife and son. 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  No    grandchildren. He smokes half a pack per day intermittently for the last 40    years. He drinks a glass of homemade wine mixed with diet Coke one time per    week. He denies any drugs, herbal medication, diet, or exercise program.         Social Determinants of Health   Financial Resource Strain: Not on file  Food Insecurity: Not on file  Transportation Needs: Not  on file  Physical Activity: Not on file  Stress: Not on file  Social Connections: Not on file  Intimate Partner Violence: Not on file    Allergies  Allergen Reactions   Sudafed [Pseudoephedrine Hcl] Palpitations   Cardizem [Diltiazem Hcl] Rash    Current Outpatient Medications  Medication Sig Dispense Refill   acetaminophen (TYLENOL) 500 MG tablet Take 500 mg by mouth every 6 (six) hours as needed for mild pain.      aspirin EC 81 MG EC tablet Take 1 tablet (81 mg total) by mouth daily.     b complex vitamins capsule Take 1 capsule by mouth daily.     Carboxymethylcellul-Glycerin (CLEAR EYES FOR DRY EYES OP) Place 1 drop into both eyes daily as needed (Dry eyes).     carvedilol (COREG) 3.125 MG tablet Take 1 tablet (3.125 mg total) by mouth 2 (two) times daily. 180 tablet 3   chlorzoxazone (PARAFON) 500 MG tablet Take 500 mg by mouth 3 (three) times daily as needed for muscle spasms.     Emollient (MEDERMA AG FACE EX) Apply 1 application topically daily as needed (on face for skin cancer).     famotidine (PEPCID) 20 MG tablet Take 20 mg by mouth 2 (two) times daily.     meclizine (ANTIVERT) 25 MG tablet Take 25 mg by mouth 3 (three) times daily as needed for dizziness.     polyethylene glycol (MIRALAX / GLYCOLAX) 17 g packet Take 17 g by mouth daily as needed (constipation.).     rosuvastatin (CRESTOR) 10 MG  tablet Take 10 mg by mouth every evening.      tamsulosin (FLOMAX) 0.4 MG CAPS capsule Take 0.4 mg by mouth every evening.      warfarin (COUMADIN) 5 MG tablet TAKE AS DIRECTED PER  COUMADIN  CLINIC (Patient taking differently: Take 2.5-5 mg by mouth See admin instructions. Take 5 mg Monday, all the other days take 2.5 mg in the evening) 70 tablet 1   enoxaparin (LOVENOX) 80 MG/0.8ML injection Inject 0.8 mLs (80 mg total) into the skin every 12 (twelve) hours for 9 days. 14.4 mL 0   No current facility-administered medications for this visit.    REVIEW OF SYSTEMS:  [X]  denotes positive finding, [ ]  denotes negative finding Cardiac  Comments:  Chest pain or chest pressure:    Shortness of breath upon exertion:    Short of breath when lying flat:    Irregular heart rhythm:        Vascular    Pain in calf, thigh, or hip brought on by ambulation:    Pain in feet at night that wakes you up from your sleep:     Blood clot in your veins:    Leg swelling:         Pulmonary    Oxygen at home:    Productive cough:     Wheezing:         Neurologic    Sudden weakness in arms or legs:     Sudden numbness in arms or legs:     Sudden onset of difficulty speaking or slurred speech:    Temporary loss of vision in one eye:     Problems with dizziness:         Gastrointestinal    Blood in stool:     Vomited blood:         Genitourinary    Burning when urinating:     Blood in urine:  Psychiatric    Major depression:         Hematologic    Bleeding problems:    Problems with blood clotting too easily:        Skin    Rashes or ulcers:        Constitutional    Fever or chills:      PHYSICAL EXAM: Vitals:   03/08/21 1355  BP: (!) 154/95  Pulse: 86  Resp: 18  Temp: 97.9 F (36.6 C)  TempSrc: Temporal  SpO2: 96%  Weight: 198 lb (89.8 kg)  Height: 6\' 3"  (1.905 m)    GENERAL: The patient is a well-nourished male, in no acute distress. The vital signs are documented  above. CARDIAC: There is a regular rate and rhythm.  VASCULAR:  Palpable femoral pulses bilaterally No palpable right pedal pulses but no tissue loss and foot warm Left PT palpable  DATA:   ABIs today are 1.01 on the left but improved from monophasic waveforms to triphasic at the ankle.    Assessment/Plan:  87 year old male with multiple medical comorbidities that presents for follow-up after recent left leg intervention.  This included left SFA and above-knee popliteal angioplasty with stenting on 02/03/2021 for occlusion at [facility] canal.  He had an excellent result and now has palpable PT pulse at the ankle.  His ABIs improved to normal triphasic waveform at the ankle and greater than 1.  His left leg stents are widely patent on duplex.  Very pleased with his progress.  After we treated the more symptomatic left leg, he is having very little issues with the contralateral right leg as well.  We will continue observation.  Discussed he stay on aspirin from my standpoint and he is already on Coumadin.  I will see him in 6 months with left lower extremity arterial duplex and ABIs for surveillance.  Marty Heck, MD [facility] Office: [phone]

## 2021-03-09 ENCOUNTER — Ambulatory Visit: Payer: Medicare Other

## 2021-03-09 ENCOUNTER — Other Ambulatory Visit: Payer: Self-pay

## 2021-03-09 DIAGNOSIS — I4891 Unspecified atrial fibrillation: Secondary | ICD-10-CM

## 2021-03-09 DIAGNOSIS — Z5181 Encounter for therapeutic drug level monitoring: Secondary | ICD-10-CM | POA: Diagnosis not present

## 2021-03-09 DIAGNOSIS — I70213 Atherosclerosis of native arteries of extremities with intermittent claudication, bilateral legs: Secondary | ICD-10-CM

## 2021-03-09 DIAGNOSIS — I824Y9 Acute embolism and thrombosis of unspecified deep veins of unspecified proximal lower extremity: Secondary | ICD-10-CM

## 2021-03-09 DIAGNOSIS — Z953 Presence of xenogenic heart valve: Secondary | ICD-10-CM | POA: Diagnosis not present

## 2021-03-09 DIAGNOSIS — D6859 Other primary thrombophilia: Secondary | ICD-10-CM | POA: Diagnosis not present

## 2021-03-09 LAB — POCT INR: INR: 2.9 (ref 2.0–3.0)

## 2021-03-09 NOTE — Patient Instructions (Signed)
Description   Continue taking  warfarin 1/2 tablet daily except for 1 tablet on Mondays. Recheck INR in 4 weeks. [facility] [phone].

## 2021-03-18 ENCOUNTER — Other Ambulatory Visit: Payer: Self-pay | Admitting: Internal Medicine

## 2021-03-18 NOTE — Telephone Encounter (Signed)
Prescription refill request received for warfarin Lov: 02/01/21 Sean Brock)  Next INR check: 04/06/21 Warfarin tablet strength: 5mg   Appropriate dose and refill sent to requested pharmacy.

## 2021-03-29 ENCOUNTER — Ambulatory Visit (INDEPENDENT_AMBULATORY_CARE_PROVIDER_SITE_OTHER): Payer: Medicare Other

## 2021-03-29 DIAGNOSIS — I495 Sick sinus syndrome: Secondary | ICD-10-CM

## 2021-03-29 LAB — CUP PACEART REMOTE DEVICE CHECK
Battery Remaining Longevity: 132 mo
Battery Remaining Percentage: 100 %
Brady Statistic RA Percent Paced: 26 %
Brady Statistic RV Percent Paced: 72 %
Date Time Interrogation Session: 20230124065600
Implantable Lead Implant Date: 20201005
Implantable Lead Implant Date: 20201005
Implantable Lead Location: 753859
Implantable Lead Location: 753860
Implantable Lead Model: 7742
Implantable Lead Model: 7841
Implantable Lead Serial Number: 1017357
Implantable Lead Serial Number: 1068132
Implantable Pulse Generator Implant Date: 20201005
Lead Channel Impedance Value: 556 Ohm
Lead Channel Impedance Value: 747 Ohm
Lead Channel Pacing Threshold Amplitude: 0.8 V
Lead Channel Pacing Threshold Pulse Width: 0.4 ms
Lead Channel Setting Pacing Amplitude: 2.3 V
Lead Channel Setting Pacing Amplitude: 2.6 V
Lead Channel Setting Pacing Pulse Width: 0.4 ms
Lead Channel Setting Sensing Sensitivity: 1.5 mV
Pulse Gen Serial Number: 890709

## 2021-04-06 ENCOUNTER — Ambulatory Visit: Payer: Medicare Other | Admitting: *Deleted

## 2021-04-06 ENCOUNTER — Other Ambulatory Visit: Payer: Self-pay

## 2021-04-06 DIAGNOSIS — D6859 Other primary thrombophilia: Secondary | ICD-10-CM | POA: Diagnosis not present

## 2021-04-06 DIAGNOSIS — I4891 Unspecified atrial fibrillation: Secondary | ICD-10-CM

## 2021-04-06 DIAGNOSIS — Z5181 Encounter for therapeutic drug level monitoring: Secondary | ICD-10-CM

## 2021-04-06 DIAGNOSIS — Z953 Presence of xenogenic heart valve: Secondary | ICD-10-CM

## 2021-04-06 DIAGNOSIS — I824Y9 Acute embolism and thrombosis of unspecified deep veins of unspecified proximal lower extremity: Secondary | ICD-10-CM

## 2021-04-06 LAB — POCT INR: INR: 2.5 (ref 2.0–3.0)

## 2021-04-06 NOTE — Patient Instructions (Signed)
Description   Continue taking warfarin 1/2 tablet daily except for 1 tablet on Mondays. Recheck INR in 6 weeks. [facility] [phone].

## 2021-04-08 NOTE — Progress Notes (Signed)
Remote pacemaker transmission.   

## 2021-04-16 ENCOUNTER — Other Ambulatory Visit: Payer: Self-pay | Admitting: Internal Medicine

## 2021-04-18 NOTE — Telephone Encounter (Signed)
Prescription refill request received for warfarin Lov: Sean Brock, 02/01/2021 Next INR check: 3/13 Warfarin tablet strength:  5mg 

## 2021-04-28 DIAGNOSIS — E7800 Pure hypercholesterolemia, unspecified: Secondary | ICD-10-CM | POA: Diagnosis not present

## 2021-04-28 DIAGNOSIS — M545 Low back pain, unspecified: Secondary | ICD-10-CM | POA: Diagnosis not present

## 2021-05-16 ENCOUNTER — Ambulatory Visit: Payer: Medicare Other | Admitting: *Deleted

## 2021-05-16 ENCOUNTER — Other Ambulatory Visit: Payer: Self-pay

## 2021-05-16 DIAGNOSIS — I4891 Unspecified atrial fibrillation: Secondary | ICD-10-CM | POA: Diagnosis not present

## 2021-05-16 DIAGNOSIS — D6859 Other primary thrombophilia: Secondary | ICD-10-CM

## 2021-05-16 DIAGNOSIS — Z5181 Encounter for therapeutic drug level monitoring: Secondary | ICD-10-CM | POA: Diagnosis not present

## 2021-05-16 DIAGNOSIS — I824Y9 Acute embolism and thrombosis of unspecified deep veins of unspecified proximal lower extremity: Secondary | ICD-10-CM

## 2021-05-16 DIAGNOSIS — Z953 Presence of xenogenic heart valve: Secondary | ICD-10-CM

## 2021-05-16 LAB — POCT INR: INR: 2.5 (ref 2.0–3.0)

## 2021-05-16 NOTE — Patient Instructions (Signed)
Description   ?Continue taking warfarin 1/2 tablet daily except for 1 tablet on Mondays. Recheck INR in 6 weeks. [facility] [phone].  ?  ? ? ?

## 2021-06-27 ENCOUNTER — Ambulatory Visit: Payer: Medicare Other | Admitting: *Deleted

## 2021-06-27 DIAGNOSIS — Z953 Presence of xenogenic heart valve: Secondary | ICD-10-CM | POA: Diagnosis not present

## 2021-06-27 DIAGNOSIS — I4891 Unspecified atrial fibrillation: Secondary | ICD-10-CM

## 2021-06-27 DIAGNOSIS — I824Y9 Acute embolism and thrombosis of unspecified deep veins of unspecified proximal lower extremity: Secondary | ICD-10-CM

## 2021-06-27 DIAGNOSIS — D6859 Other primary thrombophilia: Secondary | ICD-10-CM | POA: Diagnosis not present

## 2021-06-27 DIAGNOSIS — Z5181 Encounter for therapeutic drug level monitoring: Secondary | ICD-10-CM

## 2021-06-27 LAB — POCT INR: INR: 2.7 (ref 2.0–3.0)

## 2021-06-27 NOTE — Patient Instructions (Signed)
Description   ?Continue taking warfarin 1/2 tablet daily except for 1 tablet on Mondays. Recheck INR in 6 weeks. [facility] [phone].  ?  ?  ?

## 2021-06-28 ENCOUNTER — Ambulatory Visit (INDEPENDENT_AMBULATORY_CARE_PROVIDER_SITE_OTHER): Payer: Medicare Other

## 2021-06-28 DIAGNOSIS — I495 Sick sinus syndrome: Secondary | ICD-10-CM

## 2021-06-28 LAB — CUP PACEART REMOTE DEVICE CHECK
Battery Remaining Longevity: 126 mo
Battery Remaining Percentage: 100 %
Brady Statistic RA Percent Paced: 24 %
Brady Statistic RV Percent Paced: 75 %
Date Time Interrogation Session: 20230425064200
Implantable Lead Implant Date: 20201005
Implantable Lead Implant Date: 20201005
Implantable Lead Location: 753859
Implantable Lead Location: 753860
Implantable Lead Model: 7742
Implantable Lead Model: 7841
Implantable Lead Serial Number: 1017357
Implantable Lead Serial Number: 1068132
Implantable Pulse Generator Implant Date: 20201005
Lead Channel Impedance Value: 557 Ohm
Lead Channel Impedance Value: 670 Ohm
Lead Channel Pacing Threshold Amplitude: 0.7 V
Lead Channel Pacing Threshold Pulse Width: 0.4 ms
Lead Channel Setting Pacing Amplitude: 2.3 V
Lead Channel Setting Pacing Amplitude: 2.6 V
Lead Channel Setting Pacing Pulse Width: 0.4 ms
Lead Channel Setting Sensing Sensitivity: 1.5 mV
Pulse Gen Serial Number: 890709

## 2021-07-01 DIAGNOSIS — Z08 Encounter for follow-up examination after completed treatment for malignant neoplasm: Secondary | ICD-10-CM | POA: Diagnosis not present

## 2021-07-01 DIAGNOSIS — Z85828 Personal history of other malignant neoplasm of skin: Secondary | ICD-10-CM | POA: Diagnosis not present

## 2021-07-01 DIAGNOSIS — L700 Acne vulgaris: Secondary | ICD-10-CM | POA: Diagnosis not present

## 2021-07-11 DIAGNOSIS — I119 Hypertensive heart disease without heart failure: Secondary | ICD-10-CM | POA: Diagnosis not present

## 2021-07-11 DIAGNOSIS — I2510 Atherosclerotic heart disease of native coronary artery without angina pectoris: Secondary | ICD-10-CM | POA: Diagnosis not present

## 2021-07-11 DIAGNOSIS — E7800 Pure hypercholesterolemia, unspecified: Secondary | ICD-10-CM | POA: Diagnosis not present

## 2021-07-11 DIAGNOSIS — R5383 Other fatigue: Secondary | ICD-10-CM | POA: Diagnosis not present

## 2021-07-11 DIAGNOSIS — R7309 Other abnormal glucose: Secondary | ICD-10-CM | POA: Diagnosis not present

## 2021-07-11 DIAGNOSIS — Z950 Presence of cardiac pacemaker: Secondary | ICD-10-CM | POA: Diagnosis not present

## 2021-07-11 DIAGNOSIS — I4891 Unspecified atrial fibrillation: Secondary | ICD-10-CM | POA: Diagnosis not present

## 2021-07-11 DIAGNOSIS — K219 Gastro-esophageal reflux disease without esophagitis: Secondary | ICD-10-CM | POA: Diagnosis not present

## 2021-07-11 DIAGNOSIS — Z8673 Personal history of transient ischemic attack (TIA), and cerebral infarction without residual deficits: Secondary | ICD-10-CM | POA: Diagnosis not present

## 2021-07-11 DIAGNOSIS — Z0000 Encounter for general adult medical examination without abnormal findings: Secondary | ICD-10-CM | POA: Diagnosis not present

## 2021-07-13 NOTE — Progress Notes (Signed)
Remote pacemaker transmission.   

## 2021-08-04 ENCOUNTER — Ambulatory Visit
Admission: RE | Admit: 2021-08-04 | Discharge: 2021-08-04 | Disposition: A | Discharge status: Home / Self Care | Payer: Medicare Other | Source: Ambulatory Visit | Attending: Family Medicine | Admitting: Family Medicine

## 2021-08-04 ENCOUNTER — Other Ambulatory Visit: Payer: Self-pay | Admitting: Family Medicine

## 2021-08-04 DIAGNOSIS — M545 Low back pain, unspecified: Secondary | ICD-10-CM

## 2021-08-04 DIAGNOSIS — M25552 Pain in left hip: Secondary | ICD-10-CM | POA: Diagnosis not present

## 2021-08-08 ENCOUNTER — Ambulatory Visit: Payer: Medicare Other

## 2021-08-08 DIAGNOSIS — D6859 Other primary thrombophilia: Secondary | ICD-10-CM | POA: Diagnosis not present

## 2021-08-08 DIAGNOSIS — I824Y9 Acute embolism and thrombosis of unspecified deep veins of unspecified proximal lower extremity: Secondary | ICD-10-CM

## 2021-08-08 DIAGNOSIS — I4891 Unspecified atrial fibrillation: Secondary | ICD-10-CM | POA: Diagnosis not present

## 2021-08-08 DIAGNOSIS — Z5181 Encounter for therapeutic drug level monitoring: Secondary | ICD-10-CM | POA: Diagnosis not present

## 2021-08-08 DIAGNOSIS — Z953 Presence of xenogenic heart valve: Secondary | ICD-10-CM | POA: Diagnosis not present

## 2021-08-08 LAB — POCT INR: INR: 2.6 (ref 2.0–3.0)

## 2021-08-08 NOTE — Patient Instructions (Signed)
Description   Continue taking warfarin 1/2 tablet daily except for 1 tablet on Mondays. Recheck INR in 6 weeks.  [facility] [phone].

## 2021-08-26 DIAGNOSIS — M6281 Muscle weakness (generalized): Secondary | ICD-10-CM | POA: Diagnosis not present

## 2021-08-26 DIAGNOSIS — R262 Difficulty in walking, not elsewhere classified: Secondary | ICD-10-CM | POA: Diagnosis not present

## 2021-08-26 DIAGNOSIS — R260 Ataxic gait: Secondary | ICD-10-CM | POA: Diagnosis not present

## 2021-08-29 DIAGNOSIS — R262 Difficulty in walking, not elsewhere classified: Secondary | ICD-10-CM | POA: Diagnosis not present

## 2021-08-29 DIAGNOSIS — R260 Ataxic gait: Secondary | ICD-10-CM | POA: Diagnosis not present

## 2021-08-29 DIAGNOSIS — M6281 Muscle weakness (generalized): Secondary | ICD-10-CM | POA: Diagnosis not present

## 2021-09-12 DIAGNOSIS — R260 Ataxic gait: Secondary | ICD-10-CM | POA: Diagnosis not present

## 2021-09-12 DIAGNOSIS — M6281 Muscle weakness (generalized): Secondary | ICD-10-CM | POA: Diagnosis not present

## 2021-09-12 DIAGNOSIS — R262 Difficulty in walking, not elsewhere classified: Secondary | ICD-10-CM | POA: Diagnosis not present

## 2021-09-16 DIAGNOSIS — M6281 Muscle weakness (generalized): Secondary | ICD-10-CM | POA: Diagnosis not present

## 2021-09-16 DIAGNOSIS — R262 Difficulty in walking, not elsewhere classified: Secondary | ICD-10-CM | POA: Diagnosis not present

## 2021-09-16 DIAGNOSIS — R260 Ataxic gait: Secondary | ICD-10-CM | POA: Diagnosis not present

## 2021-09-19 ENCOUNTER — Ambulatory Visit: Payer: Medicare Other

## 2021-09-19 DIAGNOSIS — I824Y9 Acute embolism and thrombosis of unspecified deep veins of unspecified proximal lower extremity: Secondary | ICD-10-CM

## 2021-09-19 DIAGNOSIS — D6859 Other primary thrombophilia: Secondary | ICD-10-CM

## 2021-09-19 DIAGNOSIS — Z5181 Encounter for therapeutic drug level monitoring: Secondary | ICD-10-CM | POA: Diagnosis not present

## 2021-09-19 DIAGNOSIS — Z953 Presence of xenogenic heart valve: Secondary | ICD-10-CM | POA: Diagnosis not present

## 2021-09-19 DIAGNOSIS — I4891 Unspecified atrial fibrillation: Secondary | ICD-10-CM | POA: Diagnosis not present

## 2021-09-19 LAB — POCT INR: INR: 1.9 — AB (ref 2.0–3.0)

## 2021-09-19 NOTE — Patient Instructions (Signed)
Description   Take 1.5 tablets today and then continue taking warfarin 1/2 tablet daily except for 1 tablet on Mondays. Recheck INR in 6 weeks.  [facility] [phone].

## 2021-09-20 ENCOUNTER — Other Ambulatory Visit: Payer: Self-pay | Admitting: Internal Medicine

## 2021-09-20 DIAGNOSIS — I4891 Unspecified atrial fibrillation: Secondary | ICD-10-CM

## 2021-09-20 NOTE — Telephone Encounter (Signed)
Prescription refill request received for warfarin Lov: 02/01/21 Sean Brock)  Next INR check: 10/31/21 Warfarin tablet strength: '5mg'$   Appropriate dose and refill sent to requested pharmacy.

## 2021-09-27 ENCOUNTER — Ambulatory Visit (INDEPENDENT_AMBULATORY_CARE_PROVIDER_SITE_OTHER): Payer: Medicare Other

## 2021-09-27 DIAGNOSIS — I495 Sick sinus syndrome: Secondary | ICD-10-CM

## 2021-09-27 LAB — CUP PACEART REMOTE DEVICE CHECK
Battery Remaining Longevity: 126 mo
Battery Remaining Percentage: 100 %
Brady Statistic RA Percent Paced: 26 %
Brady Statistic RV Percent Paced: 74 %
Date Time Interrogation Session: 20230725035400
Implantable Lead Implant Date: 20201005
Implantable Lead Implant Date: 20201005
Implantable Lead Location: 753859
Implantable Lead Location: 753860
Implantable Lead Model: 7742
Implantable Lead Model: 7841
Implantable Lead Serial Number: 1017357
Implantable Lead Serial Number: 1068132
Implantable Pulse Generator Implant Date: 20201005
Lead Channel Impedance Value: 550 Ohm
Lead Channel Impedance Value: 670 Ohm
Lead Channel Setting Pacing Amplitude: 2.3 V
Lead Channel Setting Pacing Amplitude: 2.6 V
Lead Channel Setting Pacing Pulse Width: 0.4 ms
Lead Channel Setting Sensing Sensitivity: 1.5 mV
Pulse Gen Serial Number: 890709

## 2021-09-29 NOTE — Progress Notes (Signed)
Cardiology Office Note Date:  09/29/2021  Patient ID:  Erika, Hussar 1934/11/22, MRN [id number] PCP:  Gaynelle Arabian, MD  Electrophysiologist: Dr. Lovena Le     Chief Complaint:   67mo History of Present Illness: VSAHAND GOSCHis a 87y.o. male with history of CABG (CABG 2015), AVR (bioprosthetic, 2015), AFlutter (ablated 11/13/2018), sinus node dysfunction w/PPM, stroke, HTN, HLD  He comes today to be seen for Dr. TLovena Le last seen by him jan 2021, occ dizziness, minimal palpitations, no symptoms otherwise, no changes were made.  He saw [facility] for evaluation of escalating b/l symptoms of claudication, ABIs on 01/11/2021 are 1.06 on the right and 1.16 on the left but monophasic waveforms throughout bilateral lower extremities, planned for transfemoral access with aortogram, lower extremity arteriogram, and possible intervention.  Pending Abdominal aortogram/runoff possible intervention, vascular requested hold of warfarin and bridge, our RUintahhas addressed, planned to hold 3 days, bridge with lovenox was planned at his 11/21 visit  Does not appear that they are requesting cardiology/medical clearance.   I saw him 02/01/22 He comes accompanied by his wife He denies any cardiac concerns, says he could walk and do whatever he wanted if it wer not for the pain in his legs. No CP, palpitations or cardiac awareness No  dizzy spells, near syncope or syncope Warfarin management has been good, no wide swings No bleeding or signs of bleeding They are clear on the lovenox instructions with no questions Felt an acceptable surgical cardiac risk  At his device check noted I had clear capture at 1.3/0.4 and 1.0/1.0 With programmed outputs at 2.6/0.4 AND 2.0/1.0 provokes the AT, these are not PMT are slower then MTR This ONLY occurs at increased A lead outputs His wife recalled after implant of his device that he had a terrible time with palpitations and even hospitalized his device programmed  numerous times until finally the settings found that worked well for him He has a >1V safety margin on his A lead (unchanged from last check)  TODAY He comes with his wife Cardiac-wise he denies any CP, palpitations or cardiac awareness No SOB He has been having dizzy spells, apparently now about a year, no near syncope or syncope. Typically he gets dizzy upon standing up, or when he comes up after bending over, and bright light can trigger it too. This is a feeling not of spinning, but dizzy/off balance, does not feel like he is going to faint Generally pretty transient lasting a few seconds He also reports when walking sometime his legs feel very weak, suddenly and will have to sit, not painful Pending his f/u with vascular.  He has dicussed his symptoms with his PMD, not clear that there was any specific thoughts towards cause, but seems he was told he has arthritis in his back as part of the cause perhaps.   Device information BSCi dual chamber PPM implanted 12/09/2018   Past Medical History:  Diagnosis Date   AORTIC STENOSIS    a. Echo (05/26/13):  Mod LVH, vigorous LVF, EF 65-70%, no RWMA, Gr 1 DD, severe AS (mean 40 mmHg) => s/p bioprosthetic AVR 07/2013   Arthritis    BACK   CAD (coronary artery disease)    a. Lexiscan myoview (5/13) with EF 70%, no ischemia or infarction;   b. LHC (06/2013):  Dist LM 30%, ostial LAD 40-50%, prox LAD 30%, ostial D1 80-90%, prox CFX 40%, RCA occluded with L-R collats => CABG (S-PDA at time of  AVR)   CVA (cerebral infarction)    a. 3/14 right parietal infarct. Carotid US 3/14 showed no significant disease. He has an implanted loop recorder to look for atrial fibrillation.    DYSPEPSIA    ED (erectile dysfunction)    Former smoker    GERD (gastroesophageal reflux disease)    History of kidney stones    HYPERLIPIDEMIA    HYPERTENSION    Palpitations    a. Event monitor (5/13) with occasional PVCs, PACs but no significant arrhythmia.    S/P  aortic valve replacement with bioprosthetic valve + CABG x1 07/11/2013   23 mm [facility] Ease bovine pericardial tissue valve;  Echo (08/2013):  EF 55-60%, no RWMA, AVR ok, Asc Ao 40 mm (mildly dilated), mild LAE, mild RVE, lipomatous hypertrophy, mod TR, PASP 36 mmHg   S/P CABG x 1 07/11/2013   SVG to PDA with [facility] via right thigh   Sinus bradycardia    a. 2013 - HR 40's in the office. BB stopped.   Trigeminal neuropathy    Vertigo     Past Surgical History:  Procedure Laterality Date   A-FLUTTER ABLATION N/A 11/13/2018   Procedure: A-FLUTTER ABLATION;  Surgeon: Evans Lance, MD;  Location: [place] [facility];  Service: Cardiovascular;  Laterality: N/A;   ABDOMINAL AORTOGRAM W/LOWER EXTREMITY N/A 02/03/2021   Procedure: ABDOMINAL AORTOGRAM W/LOWER EXTREMITY;  Surgeon: Marty Heck, MD;  Location: [place] [facility];  Service: Cardiovascular;  Laterality: N/A;   AORTIC VALVE REPLACEMENT N/A 07/11/2013   Procedure: AORTIC VALVE REPLACEMENT (AVR);  Surgeon: [name], MD;  Location: [place];  Service: Open Heart Surgery;  Laterality: N/A;   CATARACT EXTRACTION     CORONARY ARTERY BYPASS GRAFT N/A 07/11/2013   Procedure: CORONARY ARTERY BYPASS GRAFTING (CABG) x1;  Surgeon: [name], MD;  Location: [place];  Service: Open Heart Surgery;  Laterality: N/A;   INGUINAL HERNIA REPAIR Right 01/15/2013   Procedure: HERNIA REPAIR INGUINAL ADULT;  Surgeon: Harl Bowie, MD;  Location: [place];  Service: General;  Laterality: Right;   INSERTION OF MESH Right 01/15/2013   Procedure: INSERTION OF MESH;  Surgeon: Harl Bowie, MD;  Location: [place];  Service: General;  Laterality: Right;   INTRAOPERATIVE TRANSESOPHAGEAL ECHOCARDIOGRAM N/A 07/11/2013   Procedure: INTRAOPERATIVE TRANSESOPHAGEAL ECHOCARDIOGRAM;  Surgeon: [name], MD;  Location: [place];  Service: Open Heart Surgery;  Laterality: N/A;   LEFT AND RIGHT HEART CATHETERIZATION WITH  CORONARY ANGIOGRAM N/A 06/06/2013   Procedure: LEFT AND RIGHT HEART CATHETERIZATION WITH CORONARY ANGIOGRAM;  Surgeon: Larey Dresser, MD;  Location: [facility] C[facility] LAB;  Service: Cardiovascular;  Laterality: N/A;   LITHOTRIPSY     LOOP RECORDER IMPLANT  04/15/2013   MDT LinQ implanred by Dr Lovena Le for cryptogenic stroke   LOOP RECORDER IMPLANT N/A 04/14/2013   Procedure: LOOP RECORDER IMPLANT;  Surgeon: Evans Lance, MD;  Location: [facility] C[facility];  Service: Cardiovascular;  Laterality: N/A;   LOOP RECORDER REMOVAL N/A 11/13/2018   Procedure: LOOP RECORDER REMOVAL;  Surgeon: Evans Lance, MD;  Location: [place] [facility];  Service: Cardiovascular;  Laterality: N/A;   MOUTH SURGERY     PACEMAKER IMPLANT N/A 12/09/2018   Procedure: PACEMAKER IMPLANT;  Surgeon: Evans Lance, MD;  Location: [place] [facility];  Service: Cardiovascular;  Laterality: N/A;   PERIPHERAL VASCULAR INTERVENTION Left 02/03/2021   Procedure: PERIPHERAL VASCULAR INTERVENTION;  Surgeon: Monica Martinez  J, MD;  Location: [place] [facility];  Service: Cardiovascular;  Laterality: Left;  sfa    Current Outpatient Medications  Medication Sig Dispense Refill   acetaminophen (TYLENOL) 500 MG tablet Take 500 mg by mouth every 6 (six) hours as needed for mild pain.      aspirin EC 81 MG EC tablet Take 1 tablet (81 mg total) by mouth daily.     b complex vitamins capsule Take 1 capsule by mouth daily.     Carboxymethylcellul-Glycerin (CLEAR EYES FOR DRY EYES OP) Place 1 drop into both eyes daily as needed (Dry eyes).     carvedilol (COREG) 3.125 MG tablet Take 1 tablet (3.125 mg total) by mouth 2 (two) times daily. 180 tablet 3   chlorzoxazone (PARAFON) 500 MG tablet Take 500 mg by mouth 3 (three) times daily as needed for muscle spasms.     Emollient (MEDERMA AG FACE EX) Apply 1 application topically daily as needed (on face for skin cancer).     enoxaparin (LOVENOX) 80 MG/0.8ML injection Inject 0.8 mLs (80 mg total) into the skin every  12 (twelve) hours for 9 days. 14.4 mL 0   famotidine (PEPCID) 20 MG tablet Take 20 mg by mouth 2 (two) times daily.     meclizine (ANTIVERT) 25 MG tablet Take 25 mg by mouth 3 (three) times daily as needed for dizziness.     polyethylene glycol (MIRALAX / GLYCOLAX) 17 g packet Take 17 g by mouth daily as needed (constipation.).     rosuvastatin (CRESTOR) 10 MG tablet Take 10 mg by mouth every evening.      tamsulosin (FLOMAX) 0.4 MG CAPS capsule Take 0.4 mg by mouth every evening.      warfarin (COUMADIN) 5 MG tablet TAKE 1/2 TO 1 (ONE-HALF TO ONE) TABLET BY MOUTH ONCE DAILY AS DIRECTED BY COUMADIN CLINIC 60 tablet 0   No current facility-administered medications for this visit.    Allergies:   Sudafed [pseudoephedrine hcl] and Cardizem [diltiazem hcl]   Social History:  The patient  reports that he quit smoking about 12 years ago. His smoking use included cigarettes. He has a 30.00 pack-year smoking history. He has never used smokeless tobacco. He reports that he does not drink alcohol and does not use drugs.   Family History:  The patient's family history includes Cancer in his sister; Diabetes in his mother and unknown relative; Heart attack in his father and mother; Hypertension in his father and unknown relative; Lung cancer in his unknown relative.  ROS:  Please see the history of present illness.    All other systems are reviewed and otherwise negative.   PHYSICAL EXAM:  VS:  There were no vitals taken for this visit. BMI: There is no height or weight on file to calculate BMI. Well nourished, well developed, in no acute distress HEENT: normocephalic, atraumatic Neck: no JVD, carotid bruits or masses Cardiac:  RRR; no significant murmurs, no rubs, or gallops Lungs:  CTA b/l, no wheezing, rhonchi or rales Abd: soft, nontender MS: no deformity, age appropriate, perhaps advanced atrophy Ext: no edema Skin: warm and dry, no rash Neuro:  No gross deficits appreciated Psych: euthymic  mood, full affect  PPM site is stable, no tethering or discomfort, devise is prominent though no thinning   EKG:  not done today  Device interrogation done today and reviewed by myself:   Known for him:  Interestingly with A lead thresholds provokes very brief ATach episodes that he is  aware of I had clear capture at 1.3/0.4 and 1.0/1.0 His wife recalls after implant of his device that he had a terrible time with palpitations and even hospitalized his device programmed numerous times until finally the settings found that worked wel for him  TODAY Battery and lead measurements ar good No findings to explain his symptoms One ATR in may 8 second Aflutter He has a >1V safety margin on his A lead (unchanged from last check) V lead outputs increased for 2:1 margin AP 42% VP 50%   11/13/2018: EPA/ablation CONCLUSIONS:  1. Isthmus-dependent right atrial flutter upon presentation.  2. Successful radiofrequency ablation of atrial flutter along the cavotricuspid isthmus with complete bidirectional isthmus block achieved utilizing 3D electroanatomic mapping to guide ablation.  3. No inducible arrhythmias following ablation.  4. Successful removal of a medtronic ILR 5. No early apparent complications.    08/25/2013: TTE Study Conclusions  - Left ventricle: The cavity size was normal. Systolic function was    normal. The estimated ejection fraction was in the range of 55%    to 60%. Wall motion was normal; there were no regional wall    motion abnormalities.  - Aortic valve: A bioprosthesis was present and functioning    normally.  - Aorta: Ascending aortic diameter: 40 mm (S).  - Ascending aorta: The ascending aorta was mildly dilated.  - Mitral valve: Calcified annulus. There was trivial regurgitation.  - Left atrium: The atrium was mildly dilated.  - Right ventricle: The cavity size was mildly dilated. Wall    thickness was normal.  - Atrial septum: There was increased thickness of  the septum,    consistent with lipomatous hypertrophy.  - Tricuspid valve: There was moderate regurgitation.  - Pulmonary arteries: PA peak pressure: 36 mm Hg (S).    Recent Labs: 02/03/2021: BUN 17; Creatinine, Ser 0.80; Hemoglobin 13.3; Potassium 4.3; Sodium 141  No results found for requested labs within last 365 days.   CrCl cannot be calculated (Patient's most recent lab result is older than the maximum 21 days allowed.).   Wt Readings from Last 3 Encounters:  03/08/21 198 lb (89.8 kg)  02/03/21 198 lb (89.8 kg)  02/01/21 198 lb (89.8 kg)     Other studies reviewed: Additional studies/records reviewed today include: summarized above  ASSESSMENT AND PLAN:  CAD No anginal symptoms On ASA, BB statin  AVR (bioprsothetic) No symptoms Soft SM only on exam   PPM Intact function, no programming changes made  HTN Looks OK  HLD Not addressed today  AFlutter Ablated, burden <1 On warfarin  7 Dizziness Some suggestion of orthostatic etiology. Supine HR was charted 100bpm, though I laid him down again and could not replicate that Otherwise negative cut with some transient dizziness supine to sitting  I asked him to follow up further with his PMD Avoid standing quickly Keep well hydrated  ?bright light triggers it      Disposition: F/u with remotes as usual, in clinic in 6mo sooner if needed, will plan to update his echo at he next visit  Current medicines are reviewed at length with the patient today.  The patient did not have any concerns regarding medicines.  SVenetia Night PA-C 09/29/2021 8:12 AM     [facility] [address] [phone](office)  [phone])

## 2021-09-30 ENCOUNTER — Encounter: Payer: Self-pay | Admitting: Physician Assistant

## 2021-09-30 ENCOUNTER — Ambulatory Visit (INDEPENDENT_AMBULATORY_CARE_PROVIDER_SITE_OTHER): Payer: Medicare Other | Admitting: Physician Assistant

## 2021-09-30 VITALS — BP 118/80 | HR 60 | Ht 75.0 in | Wt 200.0 lb

## 2021-09-30 DIAGNOSIS — I4892 Unspecified atrial flutter: Secondary | ICD-10-CM | POA: Diagnosis not present

## 2021-09-30 DIAGNOSIS — R42 Dizziness and giddiness: Secondary | ICD-10-CM | POA: Diagnosis not present

## 2021-09-30 DIAGNOSIS — I2510 Atherosclerotic heart disease of native coronary artery without angina pectoris: Secondary | ICD-10-CM | POA: Diagnosis not present

## 2021-09-30 DIAGNOSIS — Z950 Presence of cardiac pacemaker: Secondary | ICD-10-CM

## 2021-09-30 DIAGNOSIS — Z952 Presence of prosthetic heart valve: Secondary | ICD-10-CM | POA: Diagnosis not present

## 2021-09-30 DIAGNOSIS — I10 Essential (primary) hypertension: Secondary | ICD-10-CM

## 2021-09-30 LAB — CUP PACEART INCLINIC DEVICE CHECK
Date Time Interrogation Session: 20230728130700
Implantable Lead Implant Date: 20201005
Implantable Lead Implant Date: 20201005
Implantable Lead Location: 753859
Implantable Lead Location: 753860
Implantable Lead Model: 7742
Implantable Lead Model: 7841
Implantable Lead Serial Number: 1017357
Implantable Lead Serial Number: 1068132
Implantable Pulse Generator Implant Date: 20201005
Lead Channel Impedance Value: 541 Ohm
Lead Channel Impedance Value: 659 Ohm
Lead Channel Pacing Threshold Amplitude: 1.2 V
Lead Channel Pacing Threshold Amplitude: 1.3 V
Lead Channel Pacing Threshold Pulse Width: 0.4 ms
Lead Channel Pacing Threshold Pulse Width: 0.4 ms
Lead Channel Sensing Intrinsic Amplitude: 4.6 mV
Lead Channel Setting Pacing Amplitude: 2.3 V
Lead Channel Setting Pacing Amplitude: 2.6 V
Lead Channel Setting Pacing Pulse Width: 0.4 ms
Lead Channel Setting Sensing Sensitivity: 1.5 mV
Pulse Gen Serial Number: 890709

## 2021-09-30 NOTE — Patient Instructions (Signed)
Medication Instructions:  Your physician recommends that you continue on your current medications as directed. Please refer to the Current Medication list given to you today.  *If you need a refill on your cardiac medications before your next appointment, please call your pharmacy*   Lab Work: None  If you have labs (blood work) drawn today and your tests are completely normal, you will receive your results only by: [place] (if you have MyChart) OR A paper copy in the mail If you have any lab test that is abnormal or we need to change your treatment, we will call you to review the results.  Follow-Up: At [facility], you and your health needs are our priority.  As part of our continuing mission to provide you with exceptional heart care, we have created designated Provider Care Teams.  These Care Teams include your primary Cardiologist (physician) and Advanced Practice Providers (APPs -  Physician Assistants and Nurse Practitioners) who all work together to provide you with the care you need, when you need it.  Your next appointment:   6 month(s)  The format for your next appointment:   In Person  Provider:   Cristopher Peru, MD

## 2021-10-21 ENCOUNTER — Ambulatory Visit: Payer: Medicare Other | Admitting: Neurology

## 2021-10-21 ENCOUNTER — Encounter: Payer: Self-pay | Admitting: Neurology

## 2021-10-21 ENCOUNTER — Telehealth: Payer: Self-pay

## 2021-10-21 VITALS — BP 122/80 | HR 97 | Ht 75.0 in | Wt 195.5 lb

## 2021-10-21 DIAGNOSIS — R269 Unspecified abnormalities of gait and mobility: Secondary | ICD-10-CM | POA: Diagnosis not present

## 2021-10-21 DIAGNOSIS — R2681 Unsteadiness on feet: Secondary | ICD-10-CM

## 2021-10-21 DIAGNOSIS — G629 Polyneuropathy, unspecified: Secondary | ICD-10-CM

## 2021-10-21 NOTE — Telephone Encounter (Signed)
I left the patient a VM to come in for blood work next week as per Dr. April Manson.

## 2021-10-21 NOTE — Patient Instructions (Signed)
Will obtain Vitamin B12 level MRI brain without contrast to rule out central etiology Consider using assistive device with ambulation Consider repeat physical therapy for gait training if symptoms get worse Follow-up as needed

## 2021-10-21 NOTE — Progress Notes (Signed)
[facility]  PATIENT: Sean Brock DOB: 07-Apr-1934  REQUESTING CLINICIAN: Gaynelle Arabian, MD HISTORY FROM: Patient and spouse  REASON FOR VISIT: Dizziness and unsteadiness    HISTORICAL  CHIEF COMPLAINT:  Chief Complaint  Patient presents with   New Patient (Initial Visit)    Rm 15. Accompanied by wife. NP/Paper Proficient/[facility]/Robert Ehinger MD/dizziness/balance issues.    HISTORY OF PRESENT ILLNESS:  This is a 87 year old gentleman past medical history atrial fibrillation on Coumadin, history of stroke, hypertension, hyperlipidemia, CAD with pacemaker placement, peripheral vascular disease who is presenting with ongoing unsteadiness and balance issue.  Patient reported history of vertigo described as room spinning but states that these current episodes that he is having are different from his vertigo.  He is not having any room spinning sensation.   He reports at times when he stands up he will feel unsteady and dizzy, and lately what he noted is when he walks, sometimes he feels like he is going to fall, then he will have to stop and wait for minute.   At time, also he feels like he cannot control his legs.  He does have bilateral lower extremities peripheral vascular disease, had leg cramping and had a stent put on his left lower extremity.  He also reports walking in the bright sun light makes his unsteadiness worse and walking in the mall with the white tiles will make his unsteadiness worse.  He did follow-up with ophthalmology, had new prescription glasses.  He did completed physical therapy, and vestibular therapy both of them, felt like vestibular therapy was helpful.  He is not using any assistive device with walking.  Denies any recent falls.     OTHER MEDICAL CONDITIONS: Atrial fibrillation on coumadin, Stroke, Hypertension, hyperlipidemia, CAD with pacemaker,    REVIEW OF SYSTEMS: Full 14 system review of systems performed and negative  with exception of: As noted in the HPI   ALLERGIES: Allergies  Allergen Reactions   Sudafed [Pseudoephedrine Hcl] Palpitations   Cardizem [Diltiazem Hcl] Rash   Sudafed [Pseudoephedrine] Palpitations    HOME MEDICATIONS: Outpatient Medications Prior to Visit  Medication Sig Dispense Refill   acetaminophen (TYLENOL) 500 MG tablet Take 500 mg by mouth every 6 (six) hours as needed for mild pain.      aspirin EC 81 MG EC tablet Take 1 tablet (81 mg total) by mouth daily.     b complex vitamins capsule Take 1 capsule by mouth daily.     Carboxymethylcellul-Glycerin (CLEAR EYES FOR DRY EYES OP) Place 1 drop into both eyes daily as needed (Dry eyes).     carvedilol (COREG) 3.125 MG tablet Take 1 tablet (3.125 mg total) by mouth 2 (two) times daily. 180 tablet 3   chlorzoxazone (PARAFON) 500 MG tablet Take 500 mg by mouth 3 (three) times daily as needed for muscle spasms.     Emollient (MEDERMA AG FACE EX) Apply 1 application topically daily as needed (on face for skin cancer).     famotidine (PEPCID) 20 MG tablet Take 20 mg by mouth 2 (two) times daily.     meclizine (ANTIVERT) 25 MG tablet Take 25 mg by mouth 3 (three) times daily as needed for dizziness.     polyethylene glycol (MIRALAX / GLYCOLAX) 17 g packet Take 17 g by mouth daily as needed (constipation.).     rosuvastatin (CRESTOR) 10 MG tablet Take 10 mg by mouth every evening.      tamsulosin (FLOMAX) 0.4 MG CAPS  capsule Take 0.4 mg by mouth every evening.      warfarin (COUMADIN) 5 MG tablet TAKE 1/2 TO 1 (ONE-HALF TO ONE) TABLET BY MOUTH ONCE DAILY AS DIRECTED BY COUMADIN CLINIC 60 tablet 0   No facility-administered medications prior to visit.    PAST MEDICAL HISTORY: Past Medical History:  Diagnosis Date   AORTIC STENOSIS    a. Echo (05/26/13):  Mod LVH, vigorous LVF, EF 65-70%, no RWMA, Gr 1 DD, severe AS (mean 40 mmHg) => s/p bioprosthetic AVR 07/2013   Arthritis    BACK   CAD (coronary artery disease)    a. Lexiscan  myoview (5/13) with EF 70%, no ischemia or infarction;   b. LHC (06/2013):  Dist LM 30%, ostial LAD 40-50%, prox LAD 30%, ostial D1 80-90%, prox CFX 40%, RCA occluded with L-R collats => CABG (S-PDA at time of AVR)   CVA (cerebral infarction)    a. 3/14 right parietal infarct. Carotid US 3/14 showed no significant disease. He has an implanted loop recorder to look for atrial fibrillation.    DYSPEPSIA    ED (erectile dysfunction)    Former smoker    GERD (gastroesophageal reflux disease)    History of kidney stones    HYPERLIPIDEMIA    HYPERTENSION    Palpitations    a. Event monitor (5/13) with occasional PVCs, PACs but no significant arrhythmia.    S/P aortic valve replacement with bioprosthetic valve + CABG x1 07/11/2013   23 mm [facility] Ease bovine pericardial tissue valve;  Echo (08/2013):  EF 55-60%, no RWMA, AVR ok, Asc Ao 40 mm (mildly dilated), mild LAE, mild RVE, lipomatous hypertrophy, mod TR, PASP 36 mmHg   S/P CABG x 1 07/11/2013   SVG to PDA with [facility] via right thigh   Sinus bradycardia    a. 2013 - HR 40's in the office. BB stopped.   Trigeminal neuropathy    Vertigo     PAST SURGICAL HISTORY: Past Surgical History:  Procedure Laterality Date   A-FLUTTER ABLATION N/A 11/13/2018   Procedure: A-FLUTTER ABLATION;  Surgeon: Evans Lance, MD;  Location: [place] [facility];  Service: Cardiovascular;  Laterality: N/A;   ABDOMINAL AORTOGRAM W/LOWER EXTREMITY N/A 02/03/2021   Procedure: ABDOMINAL AORTOGRAM W/LOWER EXTREMITY;  Surgeon: Marty Heck, MD;  Location: [place] [facility];  Service: Cardiovascular;  Laterality: N/A;   AORTIC VALVE REPLACEMENT N/A 07/11/2013   Procedure: AORTIC VALVE REPLACEMENT (AVR);  Surgeon: [name], MD;  Location: [place];  Service: Open Heart Surgery;  Laterality: N/A;   CATARACT EXTRACTION     CORONARY ARTERY BYPASS GRAFT N/A 07/11/2013   Procedure: CORONARY ARTERY BYPASS GRAFTING (CABG) x1;  Surgeon: [name], MD;  Location: [place];  Service: Open Heart Surgery;  Laterality: N/A;   INGUINAL HERNIA REPAIR Right 01/15/2013   Procedure: HERNIA REPAIR INGUINAL ADULT;  Surgeon: Harl Bowie, MD;  Location: [place];  Service: General;  Laterality: Right;   INSERTION OF MESH Right 01/15/2013   Procedure: INSERTION OF MESH;  Surgeon: Harl Bowie, MD;  Location: [place];  Service: General;  Laterality: Right;   INTRAOPERATIVE TRANSESOPHAGEAL ECHOCARDIOGRAM N/A 07/11/2013   Procedure: INTRAOPERATIVE TRANSESOPHAGEAL ECHOCARDIOGRAM;  Surgeon: [name], MD;  Location: [place];  Service: Open Heart Surgery;  Laterality: N/A;   LEFT AND RIGHT HEART CATHETERIZATION WITH CORONARY ANGIOGRAM N/A 06/06/2013   Procedure: LEFT AND RIGHT HEART CATHETERIZATION WITH CORONARY ANGIOGRAM;  Surgeon: Larey Dresser, MD;  Location: [facility] CATH LAB;  Service: Cardiovascular;  Laterality: N/A;   LITHOTRIPSY     LOOP RECORDER IMPLANT  04/15/2013   MDT LinQ implanred by Dr Lovena Le for cryptogenic stroke   LOOP RECORDER IMPLANT N/A 04/14/2013   Procedure: LOOP RECORDER IMPLANT;  Surgeon: Evans Lance, MD;  Location: [facility] CATH LAB;  Service: Cardiovascular;  Laterality: N/A;   LOOP RECORDER REMOVAL N/A 11/13/2018   Procedure: LOOP RECORDER REMOVAL;  Surgeon: Evans Lance, MD;  Location: [place] [facility];  Service: Cardiovascular;  Laterality: N/A;   MOUTH SURGERY     PACEMAKER IMPLANT N/A 12/09/2018   Procedure: PACEMAKER IMPLANT;  Surgeon: Evans Lance, MD;  Location: [place] [facility];  Service: Cardiovascular;  Laterality: N/A;   PERIPHERAL VASCULAR INTERVENTION Left 02/03/2021   Procedure: PERIPHERAL VASCULAR INTERVENTION;  Surgeon: Marty Heck, MD;  Location: [place] [facility];  Service: Cardiovascular;  Laterality: Left;  sfa    FAMILY HISTORY: Family History  Problem Relation Age of Onset   Diabetes Mother    Heart attack Mother    Heart attack Father    Hypertension Father     Diabetes Unknown    Hypertension Unknown    Lung cancer Unknown    Cancer Sister        breast   Stroke Neg Hx     SOCIAL HISTORY: Social History   Socioeconomic History   Marital status: Married    Spouse name: Not on file   Number of children: Not on file   Years of education: Not on file   Highest education level: Not on file  Occupational History   Not on file  Tobacco Use   Smoking status: Former    Packs/day: 1.00    Years: 30.00    Total pack years: 30.00    Types: Cigarettes    Quit date: 08/29/2009    Years since quitting: 12.1   Smokeless tobacco: Never  Vaping Use   Vaping Use: Every day  Substance and Sexual Activity   Alcohol use: No   Drug use: No   Sexual activity: Not Currently  Other Topics Concern   Not on file  Social History Narrative   He resides in [place] with his wife and son. 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  No    grandchildren. He smokes half a pack per day intermittently for the last 40    years. He drinks a glass of homemade wine mixed with diet Coke one time per    week. He denies any drugs, herbal medication, diet, or exercise program.         Social Determinants of Health   Financial Resource Strain: Not on file  Food Insecurity: Not on file  Transportation Needs: Not on file  Physical Activity: Not on file  Stress: Not on file  Social Connections: Not on file  Intimate Partner Violence: Not on file    PHYSICAL EXAM  GENERAL EXAM/CONSTITUTIONAL: Vitals:  Vitals:   10/21/21 0844 10/21/21 0846  BP: 124/84 122/80  Pulse: 99 97  Weight: 195 lb 8 oz (88.7 kg)   Height: '6\' 3"'$  (1.905 m)    Body mass index is 24.44 kg/m. Wt Readings from Last 3 Encounters:  10/21/21 195 lb 8 oz (88.7 kg)  09/30/21 200 lb (90.7 kg)  03/08/21 198 lb (89.8 kg)   Patient is  in no distress; well developed, nourished and groomed; neck is supple  EYES: Pupils round and reactive to light, Visual  fields full to confrontation, Extraocular movements intacts,   MUSCULOSKELETAL: Gait, strength, tone, movements noted in Neurologic exam below  NEUROLOGIC: MENTAL STATUS:      No data to display         awake, alert, oriented to person, place and time recent and remote memory intact normal attention and concentration language fluent, comprehension intact, naming intact fund of knowledge appropriate  CRANIAL NERVE:  2nd, 3rd, 4th, 6th - pupils equal and reactive to light, visual fields full to confrontation, extraocular muscles intact, no nystagmus 5th - facial sensation symmetric 7th - facial strength symmetric 8th - hearing intact 9th - palate elevates symmetrically, uvula midline 11th - shoulder shrug symmetric 12th - tongue protrusion midline  MOTOR:  normal bulk and tone, full strength in the BUE, BLE  SENSORY:  Normal sensation to light touch and pinprick but decrease sensation to vibration up to mid shin  COORDINATION:  finger-nose-finger, fine finger movements normal  REFLEXES:  deep tendon reflexes present and symmetric  GAIT/STATION:  Normal with positive romberg, unable to tandem.    DIAGNOSTIC DATA (LABS, IMAGING, TESTING) - I reviewed patient records, labs, notes, testing and imaging myself where available.  Lab Results  Component Value Date   WBC 6.3 12/20/2018   HGB 13.3 02/03/2021   HCT 39.0 02/03/2021   MCV 91.9 12/20/2018   PLT 207 12/20/2018      Component Value Date/Time   NA 141 02/03/2021 1019   K 4.3 02/03/2021 1019   CL 105 02/03/2021 1019   CO2 24 12/20/2018 0648   GLUCOSE 102 (H) 02/03/2021 1019   BUN 17 02/03/2021 1019   CREATININE 0.80 02/03/2021 1019   CALCIUM 8.8 (L) 12/20/2018 0648   PROT 7.0 07/07/2013 1339   ALBUMIN 4.0 07/07/2013 1339   AST 18 07/07/2013 1339   ALT 12 07/07/2013 1339   ALKPHOS 39 07/07/2013 1339   BILITOT 0.6 07/07/2013 1339   GFRNONAA >60 12/20/2018 0648   GFRAA >60 12/20/2018 0648   Lab  Results  Component Value Date   CHOL 118 04/23/2014   HDL 31.80 (L) 04/23/2014   LDLCALC 48 04/23/2014   TRIG 191.0 (H) 04/23/2014   CHOLHDL 4 04/23/2014   Lab Results  Component Value Date   HGBA1C 5.6 07/07/2013   No results found for: "VITAMINB12" Lab Results  Component Value Date   TSH 2.320 06/12/2013     ASSESSMENT AND PLAN  87 y.o. year old male with history atrial fibrillation on Coumadin, history of stroke, hypertension, hyperlipidemia, CAD with pacemaker placement, peripheral vascular disease who is presenting with ongoing unsteadiness and balance issue.  Denies vertigo, on exam there was no evidence of ataxia.  He does have loss of vibratory sense in the bilateral feet up to mid shin, positive Romberg and inability to tandem.  I do believe the patient unsteadiness is likely related to peripheral neuropathy but will order MRI brain to rule out central etiology.  He is on Coumadin and he has a mild risk for fall.  I did advise him to use an assistive device with ambulation just for safety.  Continue to follow with PCP, consider another round of physical therapy for balance training if your symptoms gets worse.  Return as needed.   1. Gait abnormality   2. Unsteadiness   3. Peripheral polyneuropathy      There are no Patient  Instructions on file for this visit.  Orders Placed This Encounter  Procedures   MR BRAIN WO CONTRAST   Vitamin B12    No orders of the defined types were placed in this encounter.   Return if symptoms worsen or fail to improve.  I have spent a total of 45 minutes dedicated to this patient today, preparing to see patient, performing a medically appropriate examination and evaluation, ordering tests and/or medications and procedures, and counseling and educating the patient/family/caregiver; independently interpreting result and communicating results to the family/patient/caregiver; and documenting clinical information in the electronic medical  record.   Alric Ran, MD 10/21/2021, 10:02 AM  [facility] [address], [place], [place] [address] [phone]

## 2021-10-22 LAB — VITAMIN B12: Vitamin B-12: 322 pg/mL (ref 232–1245)

## 2021-10-24 ENCOUNTER — Telehealth: Payer: Self-pay | Admitting: Neurology

## 2021-10-24 MED ORDER — VITAMIN B-12 1000 MCG PO TABS: 1000.0000 ug | ORAL_TABLET | Freq: Every day | ORAL | 0 refills | Status: AC

## 2021-10-24 NOTE — Telephone Encounter (Signed)
I returned pt's wife call and advised at this time no other labs were needed since vit b 12 resulted as normal.  She did state she called [facility] and was advised the MRI could not be completed due to the pacemaker he has placed.  Wanted to know if [facility] could accommodate this request?

## 2021-10-24 NOTE — Telephone Encounter (Signed)
Pt's wife, Claxton Levitz said received  a message on MyChart to get labwork done. Pt had blood work done at last appt on 10/21/21. Would like a call back to verify he needs to have more labs done.

## 2021-10-24 NOTE — Telephone Encounter (Signed)
[employer] medicare order sent to [facility] they will reach out to the patient to schedule.  

## 2021-10-24 NOTE — Addendum Note (Signed)
Addended byAlric Ran on: 10/24/2021 09:20 AM   Modules accepted: Orders

## 2021-10-24 NOTE — Progress Notes (Signed)
Remote pacemaker transmission.   

## 2021-10-24 NOTE — Telephone Encounter (Signed)
I sent the MRI to [facility] they will call the patient to schedule. [phone]

## 2021-10-31 ENCOUNTER — Ambulatory Visit: Payer: Medicare Other | Attending: Internal Medicine

## 2021-10-31 DIAGNOSIS — D6859 Other primary thrombophilia: Secondary | ICD-10-CM | POA: Diagnosis not present

## 2021-10-31 DIAGNOSIS — I824Y9 Acute embolism and thrombosis of unspecified deep veins of unspecified proximal lower extremity: Secondary | ICD-10-CM | POA: Diagnosis not present

## 2021-10-31 DIAGNOSIS — Z5181 Encounter for therapeutic drug level monitoring: Secondary | ICD-10-CM | POA: Diagnosis not present

## 2021-10-31 DIAGNOSIS — Z953 Presence of xenogenic heart valve: Secondary | ICD-10-CM

## 2021-10-31 DIAGNOSIS — I4891 Unspecified atrial fibrillation: Secondary | ICD-10-CM | POA: Diagnosis not present

## 2021-10-31 LAB — POCT INR: INR: 3 (ref 2.0–3.0)

## 2021-10-31 NOTE — Patient Instructions (Signed)
continue taking warfarin 1/2 tablet daily except for 1 tablet on Mondays. Recheck INR in 6 weeks.  Coumadin Clinic [phone].

## 2021-12-03 DIAGNOSIS — Z23 Encounter for immunization: Secondary | ICD-10-CM | POA: Diagnosis not present

## 2021-12-07 ENCOUNTER — Ambulatory Visit (HOSPITAL_COMMUNITY)
Admission: RE | Admit: 2021-12-07 | Discharge: 2021-12-07 | Disposition: A | Discharge status: Home / Self Care | Payer: Medicare Other | Source: Ambulatory Visit | Attending: Neurology | Admitting: Neurology

## 2021-12-07 DIAGNOSIS — R269 Unspecified abnormalities of gait and mobility: Secondary | ICD-10-CM | POA: Diagnosis not present

## 2021-12-07 DIAGNOSIS — G319 Degenerative disease of nervous system, unspecified: Secondary | ICD-10-CM | POA: Diagnosis not present

## 2021-12-07 DIAGNOSIS — R42 Dizziness and giddiness: Secondary | ICD-10-CM | POA: Diagnosis not present

## 2021-12-07 NOTE — Progress Notes (Signed)
Patient here today at [facility] for MRI brain wo contrast. Patient has Product/process development scientist. [facility] with Joey-rep performed. Pacing on, rate 70. Will re-program once scan is completed.

## 2021-12-12 ENCOUNTER — Other Ambulatory Visit: Payer: Self-pay | Admitting: *Deleted

## 2021-12-12 DIAGNOSIS — I70213 Atherosclerosis of native arteries of extremities with intermittent claudication, bilateral legs: Secondary | ICD-10-CM

## 2021-12-14 ENCOUNTER — Ambulatory Visit: Payer: Medicare Other | Attending: Internal Medicine

## 2021-12-14 DIAGNOSIS — Z5181 Encounter for therapeutic drug level monitoring: Secondary | ICD-10-CM

## 2021-12-14 DIAGNOSIS — I824Y9 Acute embolism and thrombosis of unspecified deep veins of unspecified proximal lower extremity: Secondary | ICD-10-CM | POA: Diagnosis not present

## 2021-12-14 DIAGNOSIS — I4891 Unspecified atrial fibrillation: Secondary | ICD-10-CM

## 2021-12-14 DIAGNOSIS — D6859 Other primary thrombophilia: Secondary | ICD-10-CM | POA: Diagnosis not present

## 2021-12-14 DIAGNOSIS — Z953 Presence of xenogenic heart valve: Secondary | ICD-10-CM

## 2021-12-14 LAB — POCT INR: INR: 2.6 (ref 2.0–3.0)

## 2021-12-14 NOTE — Patient Instructions (Signed)
continue taking warfarin 1/2 tablet daily except for 1 tablet on Mondays. Recheck INR in 6 weeks.  Coumadin Clinic [phone].

## 2021-12-20 ENCOUNTER — Ambulatory Visit (HOSPITAL_COMMUNITY)
Admission: RE | Admit: 2021-12-20 | Discharge: 2021-12-20 | Disposition: A | Discharge status: Home / Self Care | Payer: Medicare Other | Source: Ambulatory Visit | Attending: Vascular Surgery | Admitting: Vascular Surgery

## 2021-12-20 ENCOUNTER — Encounter: Payer: Self-pay | Admitting: Vascular Surgery

## 2021-12-20 ENCOUNTER — Ambulatory Visit: Payer: Medicare Other | Admitting: Vascular Surgery

## 2021-12-20 ENCOUNTER — Ambulatory Visit (INDEPENDENT_AMBULATORY_CARE_PROVIDER_SITE_OTHER)
Admission: RE | Admit: 2021-12-20 | Discharge: 2021-12-20 | Disposition: A | Discharge status: Home / Self Care | Payer: Medicare Other | Source: Ambulatory Visit | Attending: Vascular Surgery | Admitting: Vascular Surgery

## 2021-12-20 VITALS — BP 150/83 | HR 60 | Temp 98.0°F | Resp 18 | Ht 75.0 in | Wt 198.0 lb

## 2021-12-20 DIAGNOSIS — I70213 Atherosclerosis of native arteries of extremities with intermittent claudication, bilateral legs: Secondary | ICD-10-CM | POA: Insufficient documentation

## 2021-12-20 NOTE — Progress Notes (Signed)
Patient name: Sean Brock MRN: [id number] DOB: [birth year] Sex: male  REASON FOR CONSULT: 6 month follow-up after previous left lower extremity intervention  HPI: [name] is a 87 y.o. male, with history of coronary artery disease status post CABG with aortic valve replacement on coumadin, previous stroke, hypertension, hyperlipidemia, former smoker that presents for 6 month follow-up after previous left leg intervention.  He was initially seen with bilateral lower extremity short distance lifestyle limiting claudication.  On 02/03/2021 he underwent left SFA above-knee popliteal angioplasty with stenting for occlusion at [facility].  Left leg is doing great.  Not having any contralateral right leg problems either.  Overall pleased today.  No new complaints.  Past Medical History:  Diagnosis Date   AORTIC STENOSIS    a. Echo (05/26/13):  Mod LVH, vigorous LVF, EF 65-70%, no RWMA, Gr 1 DD, severe AS (mean 40 mmHg) => s/p bioprosthetic AVR 07/2013   Arthritis    BACK   CAD (coronary artery disease)    a. Lexiscan myoview (5/13) with EF 70%, no ischemia or infarction;   b. LHC (06/2013):  Dist LM 30%, ostial LAD 40-50%, prox LAD 30%, ostial D1 80-90%, prox CFX 40%, RCA occluded with L-R collats => CABG (S-PDA at time of AVR)   CVA (cerebral infarction)    a. 3/14 right parietal infarct. Carotid US 3/14 showed no significant disease. He has an implanted loop recorder to look for atrial fibrillation.    DYSPEPSIA    ED (erectile dysfunction)    Former smoker    GERD (gastroesophageal reflux disease)    History of kidney stones    HYPERLIPIDEMIA    HYPERTENSION    Palpitations    a. Event monitor (5/13) with occasional PVCs, PACs but no significant arrhythmia.    S/P aortic valve replacement with bioprosthetic valve + CABG x1 07/11/2013   23 mm [facility] Ease bovine pericardial tissue valve;  Echo (08/2013):  EF 55-60%, no RWMA, AVR ok, Asc Ao 40 mm (mildly dilated), mild LAE, mild  RVE, lipomatous hypertrophy, mod TR, PASP 36 mmHg   S/P CABG x 1 07/11/2013   SVG to PDA with [facility] via right thigh   Sinus bradycardia    a. 2013 - HR 40's in the office. BB stopped.   Trigeminal neuropathy    Vertigo     Past Surgical History:  Procedure Laterality Date   A-FLUTTER ABLATION N/A 11/13/2018   Procedure: A-FLUTTER ABLATION;  Surgeon: Evans Lance, MD;  Location: [place] [facility];  Service: Cardiovascular;  Laterality: N/A;   ABDOMINAL AORTOGRAM W/LOWER EXTREMITY N/A 02/03/2021   Procedure: ABDOMINAL AORTOGRAM W/LOWER EXTREMITY;  Surgeon: Marty Heck, MD;  Location: [place] [facility];  Service: Cardiovascular;  Laterality: N/A;   AORTIC VALVE REPLACEMENT N/A 07/11/2013   Procedure: AORTIC VALVE REPLACEMENT (AVR);  Surgeon: [name], MD;  Location: [place];  Service: Open Heart Surgery;  Laterality: N/A;   CATARACT EXTRACTION     CORONARY ARTERY BYPASS GRAFT N/A 07/11/2013   Procedure: CORONARY ARTERY BYPASS GRAFTING (CABG) x1;  Surgeon: [name], MD;  Location: [place];  Service: Open Heart Surgery;  Laterality: N/A;   INGUINAL HERNIA REPAIR Right 01/15/2013   Procedure: HERNIA REPAIR INGUINAL ADULT;  Surgeon: Harl Bowie, MD;  Location: [place];  Service: General;  Laterality: Right;   INSERTION OF MESH Right 01/15/2013   Procedure: INSERTION OF MESH;  Surgeon: Harl Bowie, MD;  Location: [place];  Service: General;  Laterality: Right;   INTRAOPERATIVE TRANSESOPHAGEAL ECHOCARDIOGRAM N/A 07/11/2013   Procedure: INTRAOPERATIVE TRANSESOPHAGEAL ECHOCARDIOGRAM;  Surgeon: [name], MD;  Location: [place];  Service: Open Heart Surgery;  Laterality: N/A;   LEFT AND RIGHT HEART CATHETERIZATION WITH CORONARY ANGIOGRAM N/A 06/06/2013   Procedure: LEFT AND RIGHT HEART CATHETERIZATION WITH CORONARY ANGIOGRAM;  Surgeon: Larey Dresser, MD;  Location: [facility] CATH LAB;  Service: Cardiovascular;  Laterality: N/A;   LITHOTRIPSY      LOOP RECORDER IMPLANT  04/15/2013   MDT LinQ implanred by Dr Lovena Le for cryptogenic stroke   LOOP RECORDER IMPLANT N/A 04/14/2013   Procedure: LOOP RECORDER IMPLANT;  Surgeon: Evans Lance, MD;  Location: [facility] CATH LAB;  Service: Cardiovascular;  Laterality: N/A;   LOOP RECORDER REMOVAL N/A 11/13/2018   Procedure: LOOP RECORDER REMOVAL;  Surgeon: Evans Lance, MD;  Location: [place] [facility];  Service: Cardiovascular;  Laterality: N/A;   MOUTH SURGERY     PACEMAKER IMPLANT N/A 12/09/2018   Procedure: PACEMAKER IMPLANT;  Surgeon: Evans Lance, MD;  Location: [place] [facility];  Service: Cardiovascular;  Laterality: N/A;   PERIPHERAL VASCULAR INTERVENTION Left 02/03/2021   Procedure: PERIPHERAL VASCULAR INTERVENTION;  Surgeon: Marty Heck, MD;  Location: [place] [facility];  Service: Cardiovascular;  Laterality: Left;  sfa    Family History  Problem Relation Age of Onset   Diabetes Mother    Heart attack Mother    Heart attack Father    Hypertension Father    Diabetes Unknown    Hypertension Unknown    Lung cancer Unknown    Cancer Sister        breast   Stroke Neg Hx     SOCIAL HISTORY: Social History   Socioeconomic History   Marital status: Married    Spouse name: Not on file   Number of children: Not on file   Years of education: Not on file   Highest education level: Not on file  Occupational History   Not on file  Tobacco Use   Smoking status: Former    Packs/day: 1.00    Years: 30.00    Total pack years: 30.00    Types: Cigarettes    Quit date: 08/29/2009    Years since quitting: 12.3   Smokeless tobacco: Never  Vaping Use   Vaping Use: Every day  Substance and Sexual Activity   Alcohol use: No   Drug use: No   Sexual activity: Not Currently  Other Topics Concern   Not on file  Social History Narrative   He resides in [place] with his wife and son. 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  No     grandchildren. He smokes half a pack per day intermittently for the last 40    years. He drinks a glass of homemade wine mixed with diet Coke one time per    week. He denies any drugs, herbal medication, diet, or exercise program.         Social Determinants of Health   Financial Resource Strain: Not on file  Food Insecurity: Not on file  Transportation Needs: Not on file  Physical Activity: Not on file  Stress: Not on file  Social Connections: Not on file  Intimate Partner Violence: Not on file    Allergies  Allergen Reactions   Sudafed [Pseudoephedrine Hcl] Palpitations  Cardizem [Diltiazem Hcl] Rash   Sudafed [Pseudoephedrine] Palpitations    Current Outpatient Medications  Medication Sig Dispense Refill   acetaminophen (TYLENOL) 500 MG tablet Take 500 mg by mouth every 6 (six) hours as needed for mild pain.      aspirin EC 81 MG EC tablet Take 1 tablet (81 mg total) by mouth daily.     b complex vitamins capsule Take 1 capsule by mouth daily.     Carboxymethylcellul-Glycerin (CLEAR EYES FOR DRY EYES OP) Place 1 drop into both eyes daily as needed (Dry eyes).     carvedilol (COREG) 3.125 MG tablet Take 1 tablet (3.125 mg total) by mouth 2 (two) times daily. 180 tablet 3   chlorzoxazone (PARAFON) 500 MG tablet Take 500 mg by mouth 3 (three) times daily as needed for muscle spasms.     Emollient (MEDERMA AG FACE EX) Apply 1 application topically daily as needed (on face for skin cancer).     famotidine (PEPCID) 20 MG tablet Take 20 mg by mouth 2 (two) times daily.     meclizine (ANTIVERT) 25 MG tablet Take 25 mg by mouth 3 (three) times daily as needed for dizziness.     polyethylene glycol (MIRALAX / GLYCOLAX) 17 g packet Take 17 g by mouth daily as needed (constipation.).     rosuvastatin (CRESTOR) 10 MG tablet Take 10 mg by mouth every evening.      tamsulosin (FLOMAX) 0.4 MG CAPS capsule Take 0.4 mg by mouth every evening.      warfarin (COUMADIN) 5 MG tablet TAKE 1/2 TO 1  (ONE-HALF TO ONE) TABLET BY MOUTH ONCE DAILY AS DIRECTED BY COUMADIN CLINIC 60 tablet 0   cyanocobalamin (VITAMIN B12) 1000 MCG tablet Take 1 tablet (1,000 mcg total) by mouth daily. (Patient not taking: Reported on 12/20/2021) 90 tablet 0   No current facility-administered medications for this visit.    REVIEW OF SYSTEMS:  '[X]'$  denotes positive finding, '[ ]'$  denotes negative finding Cardiac  Comments:  Chest pain or chest pressure:    Shortness of breath upon exertion:    Short of breath when lying flat:    Irregular heart rhythm:        Vascular    Pain in calf, thigh, or hip brought on by ambulation:    Pain in feet at night that wakes you up from your sleep:     Blood clot in your veins:    Leg swelling:         Pulmonary    Oxygen at home:    Productive cough:     Wheezing:         Neurologic    Sudden weakness in arms or legs:     Sudden numbness in arms or legs:     Sudden onset of difficulty speaking or slurred speech:    Temporary loss of vision in one eye:     Problems with dizziness:         Gastrointestinal    Blood in stool:     Vomited blood:         Genitourinary    Burning when urinating:     Blood in urine:        Psychiatric    Major depression:         Hematologic    Bleeding problems:    Problems with blood clotting too easily:        Skin    Rashes or ulcers:  Constitutional    Fever or chills:      PHYSICAL EXAM: Vitals:   12/20/21 1437  BP: (!) 150/83  Pulse: 60  Resp: 18  Temp: 98 F (36.7 C)  TempSrc: Temporal  SpO2: 98%  Weight: 198 lb (89.8 kg)  Height: '6\' 3"'$  (1.905 m)    GENERAL: The patient is a well-nourished male, in no acute distress. The vital signs are documented above. CARDIAC: There is a regular rate and rhythm.  VASCULAR:  Palpable femoral pulses bilaterally No palpable right pedal pulses but no tissue loss and foot warm Left PT palpable  DATA:   ABIs today are 0.91 on the right biphasic and 1.06 on  the left triphasic  Left SFA stents widely patent on duplex, some disease distal to stented segment with velocity 257  Assessment/Plan:  87 year old male with multiple medical comorbidities that presents for 6 month follow-up after previous left leg intervention.  This included left SFA and above-knee popliteal angioplasty with stenting on 02/03/2021 for occlusion at [facility] canal.  Continues to have a palpable pulse in the left foot.  The left leg stent is patent by duplex today.  There is some disease distal to the stent but again he is having no recurrent symptoms and has a preserved ABI with a normal exam.  We will follow this with continued surveillance with a left leg arterial duplex and ABIs in 6 months.  Marty Heck, MD [facility] Office: [phone]

## 2021-12-22 ENCOUNTER — Other Ambulatory Visit: Payer: Self-pay

## 2021-12-22 DIAGNOSIS — I70213 Atherosclerosis of native arteries of extremities with intermittent claudication, bilateral legs: Secondary | ICD-10-CM

## 2021-12-27 ENCOUNTER — Ambulatory Visit (INDEPENDENT_AMBULATORY_CARE_PROVIDER_SITE_OTHER): Payer: Medicare Other

## 2021-12-27 DIAGNOSIS — I495 Sick sinus syndrome: Secondary | ICD-10-CM | POA: Diagnosis not present

## 2021-12-28 LAB — CUP PACEART REMOTE DEVICE CHECK
Battery Remaining Longevity: 126 mo
Battery Remaining Percentage: 100 %
Brady Statistic RA Percent Paced: 41 %
Brady Statistic RV Percent Paced: 71 %
Date Time Interrogation Session: 20231024034100
Implantable Lead Connection Status: 753985
Implantable Lead Connection Status: 753985
Implantable Lead Implant Date: 20201005
Implantable Lead Implant Date: 20201005
Implantable Lead Location: 753859
Implantable Lead Location: 753860
Implantable Lead Model: 7742
Implantable Lead Model: 7841
Implantable Lead Serial Number: 1017357
Implantable Lead Serial Number: 1068132
Implantable Pulse Generator Implant Date: 20201005
Lead Channel Impedance Value: 535 Ohm
Lead Channel Impedance Value: 640 Ohm
Lead Channel Setting Pacing Amplitude: 2.3 V
Lead Channel Setting Pacing Amplitude: 2.6 V
Lead Channel Setting Pacing Pulse Width: 0.4 ms
Lead Channel Setting Sensing Sensitivity: 1.5 mV
Pulse Gen Serial Number: 890709
Zone Setting Status: 755011

## 2022-01-11 DIAGNOSIS — D6869 Other thrombophilia: Secondary | ICD-10-CM | POA: Diagnosis not present

## 2022-01-11 DIAGNOSIS — Z950 Presence of cardiac pacemaker: Secondary | ICD-10-CM | POA: Diagnosis not present

## 2022-01-11 DIAGNOSIS — R269 Unspecified abnormalities of gait and mobility: Secondary | ICD-10-CM | POA: Diagnosis not present

## 2022-01-11 DIAGNOSIS — K219 Gastro-esophageal reflux disease without esophagitis: Secondary | ICD-10-CM | POA: Diagnosis not present

## 2022-01-11 DIAGNOSIS — E7800 Pure hypercholesterolemia, unspecified: Secondary | ICD-10-CM | POA: Diagnosis not present

## 2022-01-11 DIAGNOSIS — G629 Polyneuropathy, unspecified: Secondary | ICD-10-CM | POA: Diagnosis not present

## 2022-01-11 DIAGNOSIS — I2510 Atherosclerotic heart disease of native coronary artery without angina pectoris: Secondary | ICD-10-CM | POA: Diagnosis not present

## 2022-01-11 DIAGNOSIS — I119 Hypertensive heart disease without heart failure: Secondary | ICD-10-CM | POA: Diagnosis not present

## 2022-01-11 DIAGNOSIS — R7309 Other abnormal glucose: Secondary | ICD-10-CM | POA: Diagnosis not present

## 2022-01-11 DIAGNOSIS — Z8673 Personal history of transient ischemic attack (TIA), and cerebral infarction without residual deficits: Secondary | ICD-10-CM | POA: Diagnosis not present

## 2022-01-11 DIAGNOSIS — I4891 Unspecified atrial fibrillation: Secondary | ICD-10-CM | POA: Diagnosis not present

## 2022-01-16 ENCOUNTER — Other Ambulatory Visit: Payer: Self-pay | Admitting: Internal Medicine

## 2022-01-16 DIAGNOSIS — I4891 Unspecified atrial fibrillation: Secondary | ICD-10-CM

## 2022-01-16 NOTE — Telephone Encounter (Signed)
Last INR 12/14/2021 Last OV 09/30/2021

## 2022-01-16 NOTE — Progress Notes (Signed)
Remote pacemaker transmission.   

## 2022-01-25 ENCOUNTER — Ambulatory Visit: Payer: Medicare Other | Attending: Internal Medicine

## 2022-01-25 DIAGNOSIS — Z5181 Encounter for therapeutic drug level monitoring: Secondary | ICD-10-CM

## 2022-01-25 DIAGNOSIS — D6859 Other primary thrombophilia: Secondary | ICD-10-CM

## 2022-01-25 DIAGNOSIS — I824Y9 Acute embolism and thrombosis of unspecified deep veins of unspecified proximal lower extremity: Secondary | ICD-10-CM | POA: Diagnosis not present

## 2022-01-25 DIAGNOSIS — Z953 Presence of xenogenic heart valve: Secondary | ICD-10-CM

## 2022-01-25 DIAGNOSIS — I4891 Unspecified atrial fibrillation: Secondary | ICD-10-CM | POA: Diagnosis not present

## 2022-01-25 LAB — POCT INR: INR: 3.2 — AB (ref 2.0–3.0)

## 2022-01-25 NOTE — Patient Instructions (Signed)
continue taking warfarin 1/2 tablet daily except for 1 tablet on Mondays. Recheck INR in 6 weeks. Eat greens tonight. [facility] [phone].

## 2022-03-08 ENCOUNTER — Ambulatory Visit: Payer: Medicare Other | Attending: Internal Medicine

## 2022-03-08 DIAGNOSIS — Z953 Presence of xenogenic heart valve: Secondary | ICD-10-CM

## 2022-03-08 DIAGNOSIS — I824Y9 Acute embolism and thrombosis of unspecified deep veins of unspecified proximal lower extremity: Secondary | ICD-10-CM

## 2022-03-08 DIAGNOSIS — Z5181 Encounter for therapeutic drug level monitoring: Secondary | ICD-10-CM

## 2022-03-08 DIAGNOSIS — I4891 Unspecified atrial fibrillation: Secondary | ICD-10-CM | POA: Diagnosis not present

## 2022-03-08 DIAGNOSIS — D6859 Other primary thrombophilia: Secondary | ICD-10-CM

## 2022-03-08 LAB — POCT INR: INR: 3.2 — AB (ref 2.0–3.0)

## 2022-03-08 NOTE — Patient Instructions (Signed)
continue taking warfarin 1/2 tablet daily except for 1 tablet on Mondays. Recheck INR in 6 weeks. Eat greens tonight. [facility] [phone].

## 2022-03-28 ENCOUNTER — Ambulatory Visit: Payer: Medicare Other | Attending: Internal Medicine

## 2022-03-28 DIAGNOSIS — I495 Sick sinus syndrome: Secondary | ICD-10-CM

## 2022-03-28 LAB — CUP PACEART REMOTE DEVICE CHECK
Battery Remaining Longevity: 120 mo
Battery Remaining Percentage: 100 %
Brady Statistic RA Percent Paced: 36 %
Brady Statistic RV Percent Paced: 70 %
Date Time Interrogation Session: 20240123034000
Implantable Lead Connection Status: 753985
Implantable Lead Connection Status: 753985
Implantable Lead Implant Date: 20201005
Implantable Lead Implant Date: 20201005
Implantable Lead Location: 753859
Implantable Lead Location: 753860
Implantable Lead Model: 7742
Implantable Lead Model: 7841
Implantable Lead Serial Number: 1017357
Implantable Lead Serial Number: 1068132
Implantable Pulse Generator Implant Date: 20201005
Lead Channel Impedance Value: 527 Ohm
Lead Channel Impedance Value: 643 Ohm
Lead Channel Setting Pacing Amplitude: 2.3 V
Lead Channel Setting Pacing Amplitude: 2.6 V
Lead Channel Setting Pacing Pulse Width: 0.4 ms
Lead Channel Setting Sensing Sensitivity: 1.5 mV
Pulse Gen Serial Number: 890709
Zone Setting Status: 755011

## 2022-04-01 ENCOUNTER — Other Ambulatory Visit: Payer: Self-pay

## 2022-04-01 ENCOUNTER — Inpatient Hospital Stay (HOSPITAL_COMMUNITY)
Admission: EM | Admit: 2022-04-01 | Discharge: 2022-04-03 | DRG: 812 | Disposition: A | Discharge status: Home / Self Care | Payer: Medicare Other | Source: Ambulatory Visit | Attending: Internal Medicine | Admitting: Internal Medicine

## 2022-04-01 DIAGNOSIS — Z79899 Other long term (current) drug therapy: Secondary | ICD-10-CM | POA: Diagnosis not present

## 2022-04-01 DIAGNOSIS — Z833 Family history of diabetes mellitus: Secondary | ICD-10-CM | POA: Diagnosis not present

## 2022-04-01 DIAGNOSIS — I10 Essential (primary) hypertension: Secondary | ICD-10-CM | POA: Diagnosis not present

## 2022-04-01 DIAGNOSIS — Z8249 Family history of ischemic heart disease and other diseases of the circulatory system: Secondary | ICD-10-CM | POA: Diagnosis not present

## 2022-04-01 DIAGNOSIS — Z803 Family history of malignant neoplasm of breast: Secondary | ICD-10-CM

## 2022-04-01 DIAGNOSIS — Z87442 Personal history of urinary calculi: Secondary | ICD-10-CM | POA: Diagnosis not present

## 2022-04-01 DIAGNOSIS — Z8673 Personal history of transient ischemic attack (TIA), and cerebral infarction without residual deficits: Secondary | ICD-10-CM

## 2022-04-01 DIAGNOSIS — I4891 Unspecified atrial fibrillation: Secondary | ICD-10-CM | POA: Diagnosis present

## 2022-04-01 DIAGNOSIS — Z950 Presence of cardiac pacemaker: Secondary | ICD-10-CM | POA: Diagnosis present

## 2022-04-01 DIAGNOSIS — R55 Syncope and collapse: Secondary | ICD-10-CM | POA: Diagnosis not present

## 2022-04-01 DIAGNOSIS — K219 Gastro-esophageal reflux disease without esophagitis: Secondary | ICD-10-CM | POA: Diagnosis not present

## 2022-04-01 DIAGNOSIS — Z953 Presence of xenogenic heart valve: Secondary | ICD-10-CM

## 2022-04-01 DIAGNOSIS — Z1152 Encounter for screening for COVID-19: Secondary | ICD-10-CM

## 2022-04-01 DIAGNOSIS — Z8719 Personal history of other diseases of the digestive system: Secondary | ICD-10-CM | POA: Diagnosis not present

## 2022-04-01 DIAGNOSIS — R001 Bradycardia, unspecified: Secondary | ICD-10-CM | POA: Diagnosis not present

## 2022-04-01 DIAGNOSIS — I739 Peripheral vascular disease, unspecified: Secondary | ICD-10-CM | POA: Diagnosis present

## 2022-04-01 DIAGNOSIS — E7800 Pure hypercholesterolemia, unspecified: Secondary | ICD-10-CM | POA: Diagnosis present

## 2022-04-01 DIAGNOSIS — D649 Anemia, unspecified: Secondary | ICD-10-CM | POA: Diagnosis present

## 2022-04-01 DIAGNOSIS — D509 Iron deficiency anemia, unspecified: Secondary | ICD-10-CM | POA: Diagnosis not present

## 2022-04-01 DIAGNOSIS — I2510 Atherosclerotic heart disease of native coronary artery without angina pectoris: Secondary | ICD-10-CM | POA: Diagnosis present

## 2022-04-01 DIAGNOSIS — I4892 Unspecified atrial flutter: Secondary | ICD-10-CM | POA: Diagnosis present

## 2022-04-01 DIAGNOSIS — Z87891 Personal history of nicotine dependence: Secondary | ICD-10-CM | POA: Diagnosis not present

## 2022-04-01 DIAGNOSIS — N400 Enlarged prostate without lower urinary tract symptoms: Secondary | ICD-10-CM | POA: Diagnosis present

## 2022-04-01 DIAGNOSIS — R531 Weakness: Secondary | ICD-10-CM

## 2022-04-01 DIAGNOSIS — Z7982 Long term (current) use of aspirin: Secondary | ICD-10-CM

## 2022-04-01 DIAGNOSIS — I951 Orthostatic hypotension: Secondary | ICD-10-CM | POA: Diagnosis not present

## 2022-04-01 DIAGNOSIS — I482 Chronic atrial fibrillation, unspecified: Secondary | ICD-10-CM | POA: Diagnosis not present

## 2022-04-01 DIAGNOSIS — Z7901 Long term (current) use of anticoagulants: Secondary | ICD-10-CM

## 2022-04-01 DIAGNOSIS — Z888 Allergy status to other drugs, medicaments and biological substances status: Secondary | ICD-10-CM

## 2022-04-01 DIAGNOSIS — Z951 Presence of aortocoronary bypass graft: Secondary | ICD-10-CM

## 2022-04-01 DIAGNOSIS — G500 Trigeminal neuralgia: Secondary | ICD-10-CM | POA: Diagnosis present

## 2022-04-01 DIAGNOSIS — R42 Dizziness and giddiness: Secondary | ICD-10-CM | POA: Diagnosis not present

## 2022-04-01 LAB — HEMOGLOBIN AND HEMATOCRIT, BLOOD
HCT: 27.7 % — ABNORMAL LOW (ref 39.0–52.0)
Hemoglobin: 7.8 g/dL — ABNORMAL LOW (ref 13.0–17.0)

## 2022-04-01 LAB — COMPREHENSIVE METABOLIC PANEL
ALT: 13 U/L (ref 0–44)
AST: 14 U/L — ABNORMAL LOW (ref 15–41)
Albumin: 3.8 g/dL (ref 3.5–5.0)
Alkaline Phosphatase: 30 U/L — ABNORMAL LOW (ref 38–126)
Anion gap: 8 (ref 5–15)
BUN: 22 mg/dL (ref 8–23)
CO2: 24 mmol/L (ref 22–32)
Calcium: 8.7 mg/dL — ABNORMAL LOW (ref 8.9–10.3)
Chloride: 107 mmol/L (ref 98–111)
Creatinine, Ser: 0.92 mg/dL (ref 0.61–1.24)
GFR, Estimated: >60 mL/min (ref >60)
Glucose, Bld: 86 mg/dL (ref 70–99)
Potassium: 4.2 mmol/L (ref 3.5–5.1)
Sodium: 139 mmol/L (ref 135–145)
Total Bilirubin: 0.9 mg/dL (ref 0.3–1.2)
Total Protein: 6.4 g/dL — ABNORMAL LOW (ref 6.5–8.1)

## 2022-04-01 LAB — RESP PANEL BY RT-PCR (RSV, FLU A&B, COVID)  RVPGX2
Influenza A by PCR: NEGATIVE
Influenza B by PCR: NEGATIVE
Resp Syncytial Virus by PCR: NEGATIVE
SARS Coronavirus 2 by RT PCR: NEGATIVE

## 2022-04-01 LAB — CBC WITH DIFFERENTIAL/PLATELET
Abs Immature Granulocytes: 0.01 10*3/uL (ref 0.00–0.07)
Basophils Absolute: 0.1 10*3/uL (ref 0.0–0.1)
Basophils Relative: 2 %
Eosinophils Absolute: 0.1 10*3/uL (ref 0.0–0.5)
Eosinophils Relative: 2 %
HCT: 28.8 % — ABNORMAL LOW (ref 39.0–52.0)
Hemoglobin: 8 g/dL — ABNORMAL LOW (ref 13.0–17.0)
Immature Granulocytes: 0 %
Lymphocytes Relative: 26 %
Lymphs Abs: 1.5 10*3/uL (ref 0.7–4.0)
MCH: 21 pg — ABNORMAL LOW (ref 26.0–34.0)
MCHC: 27.8 g/dL — ABNORMAL LOW (ref 30.0–36.0)
MCV: 75.6 fL — ABNORMAL LOW (ref 80.0–100.0)
Monocytes Absolute: 0.5 10*3/uL (ref 0.1–1.0)
Monocytes Relative: 9 %
Neutro Abs: 3.3 10*3/uL (ref 1.7–7.7)
Neutrophils Relative %: 61 %
Platelets: 241 10*3/uL (ref 150–400)
RBC: 3.81 MIL/uL — ABNORMAL LOW (ref 4.22–5.81)
RDW: 17 % — ABNORMAL HIGH (ref 11.5–15.5)
WBC: 5.5 10*3/uL (ref 4.0–10.5)
nRBC: 0 % (ref 0.0–0.2)

## 2022-04-01 LAB — POC OCCULT BLOOD, ED: Fecal Occult Bld: NEGATIVE

## 2022-04-01 LAB — IRON AND TIBC
Iron: 20 ug/dL — ABNORMAL LOW (ref 45–182)
Saturation Ratios: 4 % — ABNORMAL LOW (ref 17.9–39.5)
TIBC: 491 ug/dL — ABNORMAL HIGH (ref 250–450)
UIBC: 471 ug/dL

## 2022-04-01 LAB — PROTIME-INR
INR: 2.2 — ABNORMAL HIGH (ref 0.8–1.2)
Prothrombin Time: 24.2 seconds — ABNORMAL HIGH (ref 11.4–15.2)

## 2022-04-01 LAB — VITAMIN B12: Vitamin B-12: 186 pg/mL (ref 180–914)

## 2022-04-01 LAB — FERRITIN: Ferritin: 5 ng/mL — ABNORMAL LOW (ref 24–336)

## 2022-04-01 LAB — PREPARE RBC (CROSSMATCH)

## 2022-04-01 LAB — FOLATE: Folate: 13.5 ng/mL (ref >5.9)

## 2022-04-01 LAB — CBG MONITORING, ED: Glucose-Capillary: 85 mg/dL (ref 70–99)

## 2022-04-01 MED ORDER — TAMSULOSIN HCL 0.4 MG PO CAPS
0.4000 mg | ORAL_CAPSULE | Freq: Every evening | ORAL | Status: DC
  Administered 2022-04-01: 0.4 mg via ORAL
  Filled 2022-04-01: qty 1

## 2022-04-01 MED ORDER — ASPIRIN 81 MG PO TBEC
81.0000 mg | DELAYED_RELEASE_TABLET | Freq: Every day | ORAL | Status: DC
  Filled 2022-04-01: qty 1

## 2022-04-01 MED ORDER — ROSUVASTATIN CALCIUM 10 MG PO TABS
10.0000 mg | ORAL_TABLET | Freq: Every evening | ORAL | Status: DC
  Administered 2022-04-01 – 2022-04-02 (×2): 10 mg via ORAL
  Filled 2022-04-01 (×2): qty 1

## 2022-04-01 MED ORDER — MECLIZINE HCL 25 MG PO TABS: 25.0000 mg | ORAL_TABLET | Freq: Three times a day (TID) | ORAL | Status: DC | PRN

## 2022-04-01 MED ORDER — ONDANSETRON HCL 4 MG PO TABS: 4.0000 mg | ORAL_TABLET | Freq: Four times a day (QID) | ORAL | Status: DC | PRN

## 2022-04-01 MED ORDER — PANTOPRAZOLE SODIUM 40 MG IV SOLR
40.0000 mg | INTRAVENOUS | Status: DC
  Administered 2022-04-01 – 2022-04-02 (×2): 40 mg via INTRAVENOUS
  Filled 2022-04-01 (×2): qty 10

## 2022-04-01 MED ORDER — SODIUM CHLORIDE 0.9% IV SOLUTION: Freq: Once | INTRAVENOUS | Status: AC

## 2022-04-01 MED ORDER — ONDANSETRON HCL 4 MG/2ML IJ SOLN: 4.0000 mg | Freq: Four times a day (QID) | INTRAMUSCULAR | Status: DC | PRN

## 2022-04-01 MED ORDER — SENNOSIDES-DOCUSATE SODIUM 8.6-50 MG PO TABS: 1.0000 | ORAL_TABLET | Freq: Every evening | ORAL | Status: DC | PRN

## 2022-04-01 MED ORDER — FAMOTIDINE 20 MG PO TABS
20.0000 mg | ORAL_TABLET | Freq: Two times a day (BID) | ORAL | Status: DC
  Administered 2022-04-01 – 2022-04-03 (×4): 20 mg via ORAL
  Filled 2022-04-01 (×4): qty 1

## 2022-04-01 MED ORDER — ACETAMINOPHEN 650 MG RE SUPP: 650.0000 mg | Freq: Four times a day (QID) | RECTAL | Status: DC | PRN

## 2022-04-01 MED ORDER — ASPIRIN 81 MG PO TBEC
81.0000 mg | DELAYED_RELEASE_TABLET | Freq: Every day | ORAL | Status: DC
  Administered 2022-04-02 – 2022-04-03 (×2): 81 mg via ORAL
  Filled 2022-04-01 (×2): qty 1

## 2022-04-01 MED ORDER — ACETAMINOPHEN 325 MG PO TABS: 650.0000 mg | ORAL_TABLET | Freq: Four times a day (QID) | ORAL | Status: DC | PRN

## 2022-04-01 NOTE — H&P (Signed)
PCP:   Gaynelle Arabian, MD   Chief Complaint:  Lightheadedness, low systolic blood pressure  HPI: This is a 88 year old gentleman with past medical history of aortic stenosis s/p AVR 2015, CVA, hyperlipidemia, hypertension, atrial fibrillation maintained on Coumadin, CAD, trigeminal neuralgia, peripheral arterial disease, vertigo.  Presents today with complaints of dizziness.  Per wife he is dizzy with standing, light activity and whenever he moves.  He has a history of vertigo, without associated hypotension, but his dizziness is increased significantly in the past 1 to 2 weeks.  His blood pressure checked at home revealed a systolic blood pressure in the 90s (per wife,one systolic blood pressures as low as 87).  For that reason he did not take his Coreg this a.m.  They went to breakfast and he remained dizzy.  As his dizziness has persisted and his BPs remained low, his wife decided to take him to the ER this evening.  He has not received his afternoon dose of Coreg.  There is no report of chest pain.  The patient endorses mild shortness of breath.  He has not been ill, no nausea, vomiting or diarrhea.  No change in medications.  The patient's last colonoscopy was approximately 17 years ago.  He  has GERD, which is controlled with Pepcid.  His energy and appetite are good and he has had no unexpected weight loss.  In the ER patient's hemoglobin is found to be 8.  There are no previous hemoglobin levels for comparison.  Patient follows with [facility], and believes his CBC was done last year, we are unable to see the result.  In the ER patient is occult stool negative.  Here the patient has not been hypotensive.  His presenting blood pressure was 154/73.  History provided by patient and wife at bedside.  Both appear reliable.  The patient is a retired Marine scientist from [place]:  The patient denies anorexia, fever, weight loss,, vision loss, decreased hearing, hoarseness, chest pain,  syncope, dyspnea on exertion, peripheral edema, balance deficits, hemoptysis, abdominal pain, melena, hematochezia, severe indigestion/heartburn, hematuria, incontinence, genital sores, muscle weakness, suspicious skin lesions, transient blindness, difficulty walking, depression, unusual weight change, abnormal bleeding, enlarged lymph nodes, angioedema, and breast masses. Positives: Dizziness, lightheadedness, shortness of breath  Past Medical History: Past Medical History:  Diagnosis Date   AORTIC STENOSIS    a. Echo (05/26/13):  Mod LVH, vigorous LVF, EF 65-70%, no RWMA, Gr 1 DD, severe AS (mean 40 mmHg) => s/p bioprosthetic AVR 07/2013   Arthritis    BACK   CAD (coronary artery disease)    a. Lexiscan myoview (5/13) with EF 70%, no ischemia or infarction;   b. LHC (06/2013):  Dist LM 30%, ostial LAD 40-50%, prox LAD 30%, ostial D1 80-90%, prox CFX 40%, RCA occluded with L-R collats => CABG (S-PDA at time of AVR)   CVA (cerebral infarction)    a. 3/14 right parietal infarct. Carotid US 3/14 showed no significant disease. He has an implanted loop recorder to look for atrial fibrillation.    DYSPEPSIA    ED (erectile dysfunction)    Former smoker    GERD (gastroesophageal reflux disease)    History of kidney stones    HYPERLIPIDEMIA    HYPERTENSION    Palpitations    a. Event monitor (5/13) with occasional PVCs, PACs but no significant arrhythmia.    S/P aortic valve replacement with bioprosthetic valve + CABG x1 07/11/2013   23 mm [employer]  bovine pericardial tissue valve;  Echo (08/2013):  EF 55-60%, no RWMA, AVR ok, Asc Ao 40 mm (mildly dilated), mild LAE, mild RVE, lipomatous hypertrophy, mod TR, PASP 36 mmHg   S/P CABG x 1 07/11/2013   SVG to PDA with [facility] via right thigh   Sinus bradycardia    a. 2013 - HR 40's in the office. BB stopped.   Trigeminal neuropathy    Vertigo    Past Surgical History:  Procedure Laterality Date   A-FLUTTER ABLATION N/A 11/13/2018   Procedure:  A-FLUTTER ABLATION;  Surgeon: Evans Lance, MD;  Location: [place] [facility];  Service: Cardiovascular;  Laterality: N/A;   ABDOMINAL AORTOGRAM W/LOWER EXTREMITY N/A 02/03/2021   Procedure: ABDOMINAL AORTOGRAM W/LOWER EXTREMITY;  Surgeon: Marty Heck, MD;  Location: [place] [facility];  Service: Cardiovascular;  Laterality: N/A;   AORTIC VALVE REPLACEMENT N/A 07/11/2013   Procedure: AORTIC VALVE REPLACEMENT (AVR);  Surgeon: [name], MD;  Location: [place];  Service: Open Heart Surgery;  Laterality: N/A;   CATARACT EXTRACTION     CORONARY ARTERY BYPASS GRAFT N/A 07/11/2013   Procedure: CORONARY ARTERY BYPASS GRAFTING (CABG) x1;  Surgeon: [name], MD;  Location: [place];  Service: Open Heart Surgery;  Laterality: N/A;   INGUINAL HERNIA REPAIR Right 01/15/2013   Procedure: HERNIA REPAIR INGUINAL ADULT;  Surgeon: Harl Bowie, MD;  Location: [place];  Service: General;  Laterality: Right;   INSERTION OF MESH Right 01/15/2013   Procedure: INSERTION OF MESH;  Surgeon: Harl Bowie, MD;  Location: [place];  Service: General;  Laterality: Right;   INTRAOPERATIVE TRANSESOPHAGEAL ECHOCARDIOGRAM N/A 07/11/2013   Procedure: INTRAOPERATIVE TRANSESOPHAGEAL ECHOCARDIOGRAM;  Surgeon: [name], MD;  Location: [place];  Service: Open Heart Surgery;  Laterality: N/A;   LEFT AND RIGHT HEART CATHETERIZATION WITH CORONARY ANGIOGRAM N/A 06/06/2013   Procedure: LEFT AND RIGHT HEART CATHETERIZATION WITH CORONARY ANGIOGRAM;  Surgeon: Larey Dresser, MD;  Location: [facility] CATH LAB;  Service: Cardiovascular;  Laterality: N/A;   LITHOTRIPSY     LOOP RECORDER IMPLANT  04/15/2013   MDT LinQ implanred by Dr Lovena Le for cryptogenic stroke   LOOP RECORDER IMPLANT N/A 04/14/2013   Procedure: LOOP RECORDER IMPLANT;  Surgeon: Evans Lance, MD;  Location: [facility] C[facility] LAB;  Service: Cardiovascular;  Laterality: N/A;   LOOP RECORDER REMOVAL N/A 11/13/2018   Procedure: LOOP  RECORDER REMOVAL;  Surgeon: Evans Lance, MD;  Location: [place] [facility];  Service: Cardiovascular;  Laterality: N/A;   MOUTH SURGERY     PACEMAKER IMPLANT N/A 12/09/2018   Procedure: PACEMAKER IMPLANT;  Surgeon: Evans Lance, MD;  Location: [place] [facility];  Service: Cardiovascular;  Laterality: N/A;   PERIPHERAL VASCULAR INTERVENTION Left 02/03/2021   Procedure: PERIPHERAL VASCULAR INTERVENTION;  Surgeon: Marty Heck, MD;  Location: [place] [facility];  Service: Cardiovascular;  Laterality: Left;  sfa    Medications: Prior to Admission medications   Medication Sig Start Date End Date Taking? Authorizing Provider  acetaminophen (TYLENOL) 500 MG tablet Take 500 mg by mouth every 6 (six) hours as needed for mild pain.     [provider]  aspirin EC 81 MG EC tablet Take 1 tablet (81 mg total) by mouth daily. 07/15/13   Jadene Pierini E, PA-C  b complex vitamins capsule Take 1 capsule by mouth daily.    [provider]  Carboxymethylcellul-Glycerin (CLEAR EYES FOR DRY EYES OP)  Place 1 drop into both eyes daily as needed (Dry eyes).    [provider]  carvedilol (COREG) 3.125 MG tablet Take 1 tablet (3.125 mg total) by mouth 2 (two) times daily. 12/19/18   Shirley Friar, PA-C  chlorzoxazone (PARAFON) 500 MG tablet Take 500 mg by mouth 3 (three) times daily as needed for muscle spasms.    [provider]  Emollient (MEDERMA AG FACE EX) Apply 1 application topically daily as needed (on face for skin cancer).    [provider]  famotidine (PEPCID) 20 MG tablet Take 20 mg by mouth 2 (two) times daily.    [provider]  meclizine (ANTIVERT) 25 MG tablet Take 25 mg by mouth 3 (three) times daily as needed for dizziness.    [provider]  polyethylene glycol (MIRALAX / GLYCOLAX) 17 g packet Take 17 g by mouth daily as needed (constipation.).    [provider]  rosuvastatin (CRESTOR) 10 MG tablet Take 10  mg by mouth every evening.     [provider]  tamsulosin (FLOMAX) 0.4 MG CAPS capsule Take 0.4 mg by mouth every evening.  03/18/15   [provider]  warfarin (COUMADIN) 5 MG tablet TAKE 1/2 TO 1 (ONE-HALF TO ONE) TABLET BY MOUTH ONCE DAILY AS  DIRECTED  BY  COUMADIN  CLINIC 01/16/22   Evans Lance, MD    Allergies:   Allergies  Allergen Reactions   Sudafed [Pseudoephedrine Hcl] Palpitations   Cardizem [Diltiazem Hcl] Rash   Sudafed [Pseudoephedrine] Palpitations    Social History:  reports that he quit smoking about 12 years ago. His smoking use included cigarettes. He has a 30.00 pack-year smoking history. He has never used smokeless tobacco. He reports that he does not drink alcohol and does not use drugs.  Family History: Family History  Problem Relation Age of Onset   Diabetes Mother    Heart attack Mother    Heart attack Father    Hypertension Father    Diabetes Unknown    Hypertension Unknown    Lung cancer Unknown    Cancer Sister        breast   Stroke Neg Hx     Physical Exam: Vitals:   04/01/22 1405 04/01/22 1750 04/01/22 1857 04/01/22 1900  BP:  (!) 142/93 124/63 128/62  Pulse:  98 60 61  Resp:  16    Temp: 98.4 F (36.9 C) 97.9 F (36.6 C)    TempSrc: Oral Oral    SpO2:  100% 98% 96%  Weight: 92.1 kg     Height: '6\' 3"'$  (1.905 m)       General:  Alert and oriented times three, well developed and nourished, no acute distress Eyes: PERRLA, pink conjunctiva, no scleral icterus ENT: Moist oral mucosa, neck supple, no thyromegaly Lungs: clear to ascultation, no wheeze, no crackles, no use of accessory muscles Cardiovascular: regular rate and rhythm, no regurgitation, no gallops, no murmurs. No carotid bruits, no JVD Abdomen: soft, positive BS, non-tender, non-distended, no organomegaly, not an acute abdomen GU: not examined Neuro: CN II - XII grossly intact, sensation intact Musculoskeletal: strength 5/5 all extremities, no clubbing,  cyanosis or edema Skin: no rash, no subcutaneous crepitation, no decubitus Psych: appropriate patient   Labs on Admission:  Recent Labs    04/01/22 1438  NA 139  K 4.2  CL 107  CO2 24  GLUCOSE 86  BUN 22  CREATININE 0.92  CALCIUM 8.7*   Recent  Labs    04/01/22 1438  AST 14*  ALT 13  ALKPHOS 30*  BILITOT 0.9  PROT 6.4*  ALBUMIN 3.8    Recent Labs    04/01/22 1438  WBC 5.5  NEUTROABS 3.3  HGB 8.0*  HCT 28.8*  MCV 75.6*  PLT 241    Micro Results: Recent Results (from the past 240 hour(s))  Resp panel by RT-PCR (RSV, Flu A&B, Covid) Anterior Nasal Swab     Status: None   Collection Time: 04/01/22  2:36 PM   Specimen: Anterior Nasal Swab  Result Value Ref Range Status   SARS Coronavirus 2 by RT PCR NEGATIVE NEGATIVE Final    Comment: (NOTE) SARS-CoV-2 target nucleic acids are NOT DETECTED.  The SARS-CoV-2 RNA is generally detectable in upper respiratory specimens during the acute phase of infection. The lowest concentration of SARS-CoV-2 viral copies this assay can detect is 138 copies/mL. A negative result does not preclude SARS-Cov-2 infection and should not be used as the sole basis for treatment or other patient management decisions. A negative result may occur with  improper specimen collection/handling, submission of specimen other than nasopharyngeal swab, presence of viral mutation(s) within the areas targeted by this assay, and inadequate number of viral copies(<138 copies/mL). A negative result must be combined with clinical observations, patient history, and epidemiological information. The expected result is Negative.  Fact Sheet for Patients:  EntrepreneurPulse.com.au  Fact Sheet for Healthcare Providers:  IncredibleEmployment.be  This test is no t yet approved or cleared by the [place] FDA and  has been authorized for detection and/or diagnosis of SARS-CoV-2 by FDA under an Emergency Use  Authorization (EUA). This EUA will remain  in effect (meaning this test can be used) for the duration of the COVID-19 declaration under Section 564(b)(1) of the Act, 21 U.S.C.section 360bbb-3(b)(1), unless the authorization is terminated  or revoked sooner.       Influenza A by PCR NEGATIVE NEGATIVE Final   Influenza B by PCR NEGATIVE NEGATIVE Final    Comment: (NOTE) The Xpert Xpress SARS-CoV-2/FLU/RSV plus assay is intended as an aid in the diagnosis of influenza from Nasopharyngeal swab specimens and should not be used as a sole basis for treatment. Nasal washings and aspirates are unacceptable for Xpert Xpress SARS-CoV-2/FLU/RSV testing.  Fact Sheet for Patients: EntrepreneurPulse.com.au  Fact Sheet for Healthcare Providers: IncredibleEmployment.be  This test is not yet approved or cleared by the [place] FDA and has been authorized for detection and/or diagnosis of SARS-CoV-2 by FDA under an Emergency Use Authorization (EUA). This EUA will remain in effect (meaning this test can be used) for the duration of the COVID-19 declaration under Section 564(b)(1) of the Act, 21 U.S.C. section 360bbb-3(b)(1), unless the authorization is terminated or revoked.     Resp Syncytial Virus by PCR NEGATIVE NEGATIVE Final    Comment: (NOTE) Fact Sheet for Patients: EntrepreneurPulse.com.au  Fact Sheet for Healthcare Providers: IncredibleEmployment.be  This test is not yet approved or cleared by the [place] FDA and has been authorized for detection and/or diagnosis of SARS-CoV-2 by FDA under an Emergency Use Authorization (EUA). This EUA will remain in effect (meaning this test can be used) for the duration of the COVID-19 declaration under Section 564(b)(1) of the Act, 21 U.S.C. section 360bbb-3(b)(1), unless the authorization is terminated or revoked.  Performed at [facility],  [place] [address] [place], [place] [address]      Radiological Exams on Admission: No results found.  Assessment/Plan Present on  Admission:  Symptomatic anemia possible -Admit MedSurg -N.p.o., IV fluid -1 unit PRBC ordered by EDP.  Posttransfusion CBC -Anemia panel ordered -CEA ordered -GI consult in a.m. -No clear evidence of GI bleed however given anemia Coumadin has been held.  Enteric-coated aspirin daily continue -Serial H&H every 12 -Wife to attempt obtain previous hemoglobin level from April in a.m. we will obtain after GI consult -Protonix IV   Atrial fibrillation [I48.91] -Resume Coreg in a.m. -Coumadin held   Essential hypertension -Blood pressures meds on hold overnight, prn meds ordered   BPH -Flomax resumed   Pure hypercholesterolemia -Crestor resumed   Pacemaker   Matvey Llanas 04/01/2022, 8:19 PM

## 2022-04-01 NOTE — ED Provider Triage Note (Signed)
Emergency Medicine Provider Triage Evaluation Note  Sean Brock , a 87 y.o. male  was evaluated in triage.  Pt complains of dizziness.  Patient was sent by urgent care to emergency department for evaluation for dizziness.  He reports that this dizziness feels different than prior episodes of vertigo that he states and experiencing as he does not feel the room is spinning.  Patient feels dizziness worse when he stands and with movement of his head.  Patient denies any headaches, nausea, vomiting, fevers, congestion.  Prior history of atrial fibrillation and aortic valve replacement.  Review of Systems  Positive: As above Negative: As above  Physical Exam  BP (!) 154/73   Pulse 60   Temp 98.4 F (36.9 C) (Oral)   Resp 15   Ht '6\' 3"'$  (1.905 m)   Wt 92.1 kg   SpO2 100%   BMI 25.37 kg/m  Gen:   Awake, no distress   Resp:  Normal effort  MSK:   Moves extremities without difficulty  Other:  No facial droop, arm weakness, slurred speech  Medical Decision Making  Medically screening exam initiated at 2:18 PM.  Appropriate orders placed.  Florina Ou was informed that the remainder of the evaluation will be completed by another provider, this initial triage assessment does not replace that evaluation, and the importance of remaining in the ED until their evaluation is complete.     Luvenia Heller, PA-C 04/01/22 1419

## 2022-04-01 NOTE — ED Triage Notes (Addendum)
Patient reports he was sent to ED from urgent care dizziness. UC did no testing. Patient has been feeling dizzy for a few days, feels like he will pass out.  Patient reports it happens when he stands up or when he is up doing something. BP in triage  sitting 154/73 , hr 70 Standing 129/61, hr 63 Lying 138/66, hr 59  Denies pain in triage.   After going from lying to sitting in triage, patient became dizzy.  Patient endorses history of vertigo, says this feels different and room does not spin with these episodes.

## 2022-04-01 NOTE — ED Provider Notes (Signed)
[place] EMERGENCY DEPARTMENT AT [facility] Provider Note   CSN: [id number] Arrival date & time: 04/01/22  1347     History  Chief Complaint  Patient presents with   Dizziness    Sean Brock is a 88 y.o. male.  88 year old male with prior medical history as detailed below presents for evaluation.  Patient with worsening symptoms of orthostatic dizziness.  Patient reports that over the last several days he is experienced significant lightheadedness with standing.  He reports that he feels lightheaded and dizzy with rapid standing.  He also reports the symptoms with exertion.  This is new for him.  He denies associated chest pain or shortness of breath.  He reports feeling fine with sitting.  Patient is compliant with his previously prescribed medications.  He is on Coumadin and baby aspirin daily with history of CABG, atrial flutter.  He denies bloody emesis.  He denies bloody stool.  He denies prior history of significant anemia.   The history is provided by the patient and medical records.       Home Medications Prior to Admission medications   Medication Sig Start Date End Date Taking? Authorizing Provider  acetaminophen (TYLENOL) 500 MG tablet Take 500 mg by mouth every 6 (six) hours as needed for mild pain.     [provider]  aspirin EC 81 MG EC tablet Take 1 tablet (81 mg total) by mouth daily. 07/15/13   Jadene Pierini E, PA-C  b complex vitamins capsule Take 1 capsule by mouth daily.    [provider]  Carboxymethylcellul-Glycerin (CLEAR EYES FOR DRY EYES OP) Place 1 drop into both eyes daily as needed (Dry eyes).    [provider]  carvedilol (COREG) 3.125 MG tablet Take 1 tablet (3.125 mg total) by mouth 2 (two) times daily. 12/19/18   Shirley Friar, PA-C  chlorzoxazone (PARAFON) 500 MG tablet Take 500 mg by mouth 3 (three) times daily as needed for muscle spasms.    [provider]  Emollient (MEDERMA  AG FACE EX) Apply 1 application topically daily as needed (on face for skin cancer).    [provider]  famotidine (PEPCID) 20 MG tablet Take 20 mg by mouth 2 (two) times daily.    [provider]  meclizine (ANTIVERT) 25 MG tablet Take 25 mg by mouth 3 (three) times daily as needed for dizziness.    [provider]  polyethylene glycol (MIRALAX / GLYCOLAX) 17 g packet Take 17 g by mouth daily as needed (constipation.).    [provider]  rosuvastatin (CRESTOR) 10 MG tablet Take 10 mg by mouth every evening.     [provider]  tamsulosin (FLOMAX) 0.4 MG CAPS capsule Take 0.4 mg by mouth every evening.  03/18/15   [provider]  warfarin (COUMADIN) 5 MG tablet TAKE 1/2 TO 1 (ONE-HALF TO ONE) TABLET BY MOUTH ONCE DAILY AS  DIRECTED  BY  COUMADIN  CLINIC 01/16/22   Evans Lance, MD      Allergies    Sudafed [pseudoephedrine hcl], Cardizem [diltiazem hcl], and Sudafed [pseudoephedrine]    Review of Systems   Review of Systems  All other systems reviewed and are negative.   Physical Exam Updated Vital Signs BP 128/62   Pulse 61   Temp 97.9 F (36.6 C) (Oral)   Resp 16   Ht '6\' 3"'$  (1.905 m)   Wt 92.1 kg   SpO2 96%   BMI 25.37 kg/m  Physical Exam Vitals and nursing note reviewed.  Constitutional:      General: He is not in acute distress.    Appearance: Normal appearance. He is well-developed.  HENT:     Head: Normocephalic and atraumatic.  Eyes:     Conjunctiva/sclera: Conjunctivae normal.     Pupils: Pupils are equal, round, and reactive to light.  Cardiovascular:     Rate and Rhythm: Normal rate and regular rhythm.     Heart sounds: Normal heart sounds.  Pulmonary:     Effort: Pulmonary effort is normal. No respiratory distress.     Breath sounds: Normal breath sounds.  Abdominal:     General: There is no distension.     Palpations: Abdomen is soft.     Tenderness: There is no abdominal tenderness.   Musculoskeletal:        General: No deformity. Normal range of motion.     Cervical back: Normal range of motion and neck supple.  Skin:    General: Skin is warm and dry.  Neurological:     General: No focal deficit present.     Mental Status: He is alert and oriented to person, place, and time.     ED Results / Procedures / Treatments   Labs (all labs ordered are listed, but only abnormal results are displayed) Labs Reviewed  COMPREHENSIVE METABOLIC PANEL - Abnormal; Notable for the following components:      Result Value   Calcium 8.7 (*)    Total Protein 6.4 (*)    AST 14 (*)    Alkaline Phosphatase 30 (*)    All other components within normal limits  CBC WITH DIFFERENTIAL/PLATELET - Abnormal; Notable for the following components:   RBC 3.81 (*)    Hemoglobin 8.0 (*)    HCT 28.8 (*)    MCV 75.6 (*)    MCH 21.0 (*)    MCHC 27.8 (*)    RDW 17.0 (*)    All other components within normal limits  PROTIME-INR - Abnormal; Notable for the following components:   Prothrombin Time 24.2 (*)    INR 2.2 (*)    All other components within normal limits  RESP PANEL BY RT-PCR (RSV, FLU A&B, COVID)  [id number]  CBG MONITORING, ED  POC OCCULT BLOOD, ED  TYPE AND SCREEN  PREPARE RBC (CROSSMATCH)    EKG EKG Interpretation  Date/Time:  Saturday April 01 2022 13:55:26 EST Ventricular Rate:  60 PR Interval:  290 QRS Duration: 142 QT Interval:  459 QTC Calculation: 459 R Axis:   -49 Text Interpretation: Sinus rhythm Prolonged PR interval Left bundle branch block Confirmed by Dene Gentry [phone]) on 04/01/2022 6:49:39 PM  Radiology No results found.  Procedures Procedures    Medications Ordered in ED Medications - No data to display  ED Course/ Medical Decision Making/ A&P                             Medical Decision Making   Medical Screen Complete  This patient presented to the ED with complaint of orthostatic dizziness, weakness, fatigue.  This complaint  involves an extensive number of treatment options. The initial differential diagnosis includes, but is not limited to, anemia, metabolic abnormality, etc.  This presentation is: Acute, Chronic, Self-Limited, Previously Undiagnosed, Uncertain Prognosis, Complicated, Systemic Symptoms, and Threat to Life/Bodily Function  Patient is presenting with gradually progressive symptoms of orthostatic dizziness and fatigue with exertion.  Patient denies  prior history of anemia.  Patient is on Coumadin (with INR today of 2.2) and patient also takes baby aspirin daily.    Hemoglobin today is 8.0.  Patient denies symptoms of GI bleeding.  Hemoccult today is negative.  Concern for slow, occult GI bleeding is still a consideration.  Patient with presentation consistent with symptomatic anemia.  He would likely benefit from admission for further workup and treatment.  Hospitalist service made aware of case.    [facility] followed by [facility] - [facility] made aware of case.  Additional history obtained:  External records from outside sources obtained and reviewed including prior ED visits and prior Inpatient records.    Lab Tests:  I ordered and personally interpreted labs.  The pertinent results include: CBC, CMP, INR, Hemoccult, COVID, flu Cardiac Monitoring:  The patient was maintained on a cardiac monitor.  I personally viewed and interpreted the cardiac monitor which showed an underlying rhythm of: NSR  Problem List / ED Course:  Weakness, Anemia    Reevaluation:  After the interventions noted above, I reevaluated the patient and found that they have: improved   Disposition:  After consideration of the diagnostic results and the patients response to treatment, I feel that the patent would benefit from admission.          Final Clinical Impression(s) / ED Diagnoses Final diagnoses:  Anemia, unspecified type    Rx / DC Orders ED Discharge Orders     None          Valarie Merino, MD 04/01/22 2021

## 2022-04-02 DIAGNOSIS — Z7901 Long term (current) use of anticoagulants: Secondary | ICD-10-CM | POA: Diagnosis not present

## 2022-04-02 DIAGNOSIS — N400 Enlarged prostate without lower urinary tract symptoms: Secondary | ICD-10-CM | POA: Diagnosis present

## 2022-04-02 DIAGNOSIS — Z951 Presence of aortocoronary bypass graft: Secondary | ICD-10-CM | POA: Diagnosis not present

## 2022-04-02 DIAGNOSIS — I4892 Unspecified atrial flutter: Secondary | ICD-10-CM | POA: Diagnosis present

## 2022-04-02 DIAGNOSIS — Z8673 Personal history of transient ischemic attack (TIA), and cerebral infarction without residual deficits: Secondary | ICD-10-CM | POA: Diagnosis not present

## 2022-04-02 DIAGNOSIS — Z87891 Personal history of nicotine dependence: Secondary | ICD-10-CM | POA: Diagnosis not present

## 2022-04-02 DIAGNOSIS — Z8249 Family history of ischemic heart disease and other diseases of the circulatory system: Secondary | ICD-10-CM | POA: Diagnosis not present

## 2022-04-02 DIAGNOSIS — R531 Weakness: Secondary | ICD-10-CM | POA: Diagnosis present

## 2022-04-02 DIAGNOSIS — I10 Essential (primary) hypertension: Secondary | ICD-10-CM | POA: Diagnosis present

## 2022-04-02 DIAGNOSIS — E7800 Pure hypercholesterolemia, unspecified: Secondary | ICD-10-CM | POA: Diagnosis present

## 2022-04-02 DIAGNOSIS — G500 Trigeminal neuralgia: Secondary | ICD-10-CM | POA: Diagnosis present

## 2022-04-02 DIAGNOSIS — Z79899 Other long term (current) drug therapy: Secondary | ICD-10-CM | POA: Diagnosis not present

## 2022-04-02 DIAGNOSIS — Z8719 Personal history of other diseases of the digestive system: Secondary | ICD-10-CM | POA: Diagnosis not present

## 2022-04-02 DIAGNOSIS — Z953 Presence of xenogenic heart valve: Secondary | ICD-10-CM | POA: Diagnosis not present

## 2022-04-02 DIAGNOSIS — I4891 Unspecified atrial fibrillation: Secondary | ICD-10-CM | POA: Diagnosis present

## 2022-04-02 DIAGNOSIS — D509 Iron deficiency anemia, unspecified: Secondary | ICD-10-CM | POA: Diagnosis present

## 2022-04-02 DIAGNOSIS — Z7982 Long term (current) use of aspirin: Secondary | ICD-10-CM | POA: Diagnosis not present

## 2022-04-02 DIAGNOSIS — Z833 Family history of diabetes mellitus: Secondary | ICD-10-CM | POA: Diagnosis not present

## 2022-04-02 DIAGNOSIS — Z87442 Personal history of urinary calculi: Secondary | ICD-10-CM | POA: Diagnosis not present

## 2022-04-02 DIAGNOSIS — I2510 Atherosclerotic heart disease of native coronary artery without angina pectoris: Secondary | ICD-10-CM | POA: Diagnosis present

## 2022-04-02 DIAGNOSIS — I739 Peripheral vascular disease, unspecified: Secondary | ICD-10-CM | POA: Diagnosis present

## 2022-04-02 DIAGNOSIS — I951 Orthostatic hypotension: Secondary | ICD-10-CM | POA: Diagnosis present

## 2022-04-02 DIAGNOSIS — K219 Gastro-esophageal reflux disease without esophagitis: Secondary | ICD-10-CM | POA: Diagnosis present

## 2022-04-02 DIAGNOSIS — Z803 Family history of malignant neoplasm of breast: Secondary | ICD-10-CM | POA: Diagnosis not present

## 2022-04-02 DIAGNOSIS — Z1152 Encounter for screening for COVID-19: Secondary | ICD-10-CM | POA: Diagnosis not present

## 2022-04-02 LAB — HEMOGLOBIN AND HEMATOCRIT, BLOOD
HCT: 28.4 % — ABNORMAL LOW (ref 39.0–52.0)
HCT: 31.2 % — ABNORMAL LOW (ref 39.0–52.0)
HCT: 29.1 % — ABNORMAL LOW (ref 39.0–52.0)
Hemoglobin: 8.1 g/dL — ABNORMAL LOW (ref 13.0–17.0)
Hemoglobin: 9 g/dL — ABNORMAL LOW (ref 13.0–17.0)
Hemoglobin: 8.2 g/dL — ABNORMAL LOW (ref 13.0–17.0)

## 2022-04-02 LAB — BASIC METABOLIC PANEL
Anion gap: 6 (ref 5–15)
BUN: 21 mg/dL (ref 8–23)
CO2: 24 mmol/L (ref 22–32)
Calcium: 8.5 mg/dL — ABNORMAL LOW (ref 8.9–10.3)
Chloride: 108 mmol/L (ref 98–111)
Creatinine, Ser: 0.74 mg/dL (ref 0.61–1.24)
GFR, Estimated: >60 mL/min (ref >60)
Glucose, Bld: 99 mg/dL (ref 70–99)
Potassium: 4 mmol/L (ref 3.5–5.1)
Sodium: 138 mmol/L (ref 135–145)

## 2022-04-02 LAB — RETICULOCYTES
Immature Retic Fract: 19.5 % — ABNORMAL HIGH (ref 2.3–15.9)
RBC.: 3.75 MIL/uL — ABNORMAL LOW (ref 4.22–5.81)
Retic Count, Absolute: 43.9 10*3/uL (ref 19.0–186.0)
Retic Ct Pct: 1.2 % (ref 0.4–3.1)

## 2022-04-02 LAB — PROTIME-INR
INR: 2.2 — ABNORMAL HIGH (ref 0.8–1.2)
Prothrombin Time: 23.9 seconds — ABNORMAL HIGH (ref 11.4–15.2)

## 2022-04-02 MED ORDER — SODIUM CHLORIDE 0.9 % IV SOLN: INTRAVENOUS | Status: DC

## 2022-04-02 MED ORDER — VITAMIN B-12 1000 MCG PO TABS
500.0000 ug | ORAL_TABLET | Freq: Every day | ORAL | Status: DC
  Administered 2022-04-02 – 2022-04-03 (×2): 500 ug via ORAL
  Filled 2022-04-02 (×2): qty 1

## 2022-04-02 NOTE — Progress Notes (Signed)
Mobility Specialist - Progress Note  Pre-mobility: 60 bpm HR, 162/78 mmHg BP, Post-mobility: 60 bpm HR, 149/73 mmHg BP,   04/02/22 1156  Orthostatic Lying   BP- Lying 162/78  Pulse- Lying 60  Orthostatic Sitting  BP- Sitting 141/78  Pulse- Sitting 59  Orthostatic Standing at 0 minutes  BP- Standing at 0 minutes 151/63  Pulse- Standing at 0 minutes 60  Mobility  Activity Ambulated with assistance in hallway  Level of Assistance Modified independent, requires aide device or extra time  Assistive Device [employer] Ambulated (ft) 500 ft  Range of Motion/Exercises Active  Activity Response Tolerated well  Mobility Referral Yes  $Mobility charge 1 Mobility   Pt was found in bed and agreeable to try ambulation. Pt orthostatic vitals were taken and afterwards decided to try ambulation. Stated using a cane sometimes at home and since he was a bit unsteady standing decided to use it this session. C/o L lower back arthritis during ambulation. At EOS returned to sit EOB with all necessities in reach and wife in room.  Ferd Hibbs Mobility Specialist

## 2022-04-02 NOTE — Progress Notes (Signed)
OT Cancellation Note  Patient Details Name: Sean Brock MRN: [id number] DOB: [birth year]   Cancelled Treatment:    Reason Eval/Treat Not Completed: OT screened, no needs identified, will sign off  Lenward Chancellor 04/02/2022, 3:36 PM

## 2022-04-02 NOTE — Progress Notes (Signed)
PROGRESS NOTE AUSENCIO VADEN  EXB:[id number] DOB: [birth year] DOA: 04/01/2022 PCP: Gaynelle Arabian, MD   Brief Narrative/Hospital Course: [phone] gentleman with history of aortic stenosis s/p AVR 2015, CVA, hyperlipidemia, hypertension, atrial fibrillation maintained on Coumadin, CAD, trigeminal neuralgia, peripheral arterial disease, vertigo presented with complaint of dizziness with standing, light activity and whenever he moves.  He has a history of vertigo, without associated hypotension, but his dizziness is increased significantly in the past 1 to 2 weeks.  Blood pressure volume in 90s as low as 87- did not take coreg 1/27 am, and it persisted so brought to the ED.  He has no chest pain no shortness of breath nausea vomiting diarrhea. In the ER patient's hemoglobin is found to be 8.There are no previous hemoglobin levels for comparison.  Patient follows with [facility], and believes his CBC was done last year, we are unable to see the result.  In the ER patient is occult stool negative.  Here the patient has not been hypotensive.His presenting blood pressure was 154/73.  Admitted for further management  Subjective: Seen this am Much better now No fever chills, nausea or vomiting Orthostatic vitals are positive this morning.   Assessment and Plan: Principal Problem:   General weakness Active Problems:   Pure hypercholesterolemia   Essential hypertension   CAD (coronary artery disease)   S/P aortic valve replacement with bioprosthetic valve + CABG x1   Atrial fibrillation [I48.91]   Pacemaker   Symptomatic anemia   H/O: CVA (cerebrovascular accident)   Generalized weakness: multifactorial due to anemia orthostatic hypotension and co-morbodities. Cont t oaddress anemia, Orthostasis. PTOT eal.  Gentle hydration for orthostatic hypotension  Symptomatic anemia-iron deficiency:Status post 1 unit PRBC overnight.Hemoccult negative.  Anemia panel shows iron deficiency anemia-ferritin 5, B12  borderline.  Post transfusional H&H is stable monitor.  GI has been consulted due to iron deficiency anemia, although Hemoccult negative.  Coumadin on hold awaiting further GI input regarding anticoagulation/procedure  Dizziness Orthostatic hypotension: OC 160>113 this am.but no symptoms this am- we will hold off coreg, flomax, add ivf .  History of CVA CAD/PAD: No chest pain.  Continue statin, ASA, anticoagulation.  Atrial fibrillation:Rate controlled.On Coumadin INR therapeutic pharmacy to manage.  Low-dose Coreg 3.12 at home- on hold.  Coumadin on hold, INR remains therapeutic if subtherapeutic will need to bridge Recent Labs  Lab 04/01/22 1734 04/02/22 0804  INR 2.2* 2.2*    DVT prophylaxis: SCDs Start: 04/01/22 2203 Code Status:   Code Status: Full Code Family Communication: plan of care discussed with patient/wife at bedside. Patient status is: admitted as observation but remains hospitalized for ongoing evaluation of anemia, orthostatic hypotension, generalized weakness.    Level of care: Telemetry  Dispo: The patient is from: home            Anticipated disposition: home Objective: Vitals last 24 hrs: Vitals:   04/02/22 0353 04/02/22 0748 04/02/22 0750 04/02/22 0753  BP: (!) 153/99 (!) 169/79 138/89 113/64  Pulse: 100 60 100 (!) 59  Resp: '18 20 18 20  '$ Temp: 97.8 F (36.6 C) (!) 97.5 F (36.4 C)    TempSrc: Oral Oral    SpO2: 98% 97% 97% 97%  Weight:      Height:       Weight change:   Physical Examination: General exam: alert awake, older than stated age HEENT:Oral mucosa moist, Ear/Nose WNL grossly Respiratory system: bilaterally clear BS, no use of accessory muscle Cardiovascular system: S1 & S2 +, No JVD. Gastrointestinal  system: Abdomen soft,NT,ND, BS+ Nervous System:Alert, awake, moving extremities. Extremities: LE edema neg,distal peripheral pulses palpable.  Skin: No rashes,no icterus. MSK: Normal muscle bulk,tone, power  Medications reviewed:   Scheduled Meds:  aspirin EC  81 mg Oral Daily   vitamin B-12  500 mcg Oral Daily   famotidine  20 mg Oral BID   pantoprazole (PROTONIX) IV  40 mg Intravenous Q24H   rosuvastatin  10 mg Oral QPM   Continuous Infusions:  sodium chloride 50 mL/hr at 04/02/22 1103    Diet Order             Diet NPO time specified  Diet effective midnight                   Intake/Output Summary (Last 24 hours) at 04/02/2022 1126 Last data filed at 04/02/2022 0700 Gross per 24 hour  Intake 20.33 ml  Output --  Net 20.33 ml   Net IO Since Admission: 20.33 mL [04/02/22 1126]  Wt Readings from Last 3 Encounters:  04/02/22 90.1 kg  12/20/21 89.8 kg  10/21/21 88.7 kg     Unresulted Labs (From admission, onward)     Start     Ordered   04/01/22 2204  Hemoglobin and hematocrit, blood  Now then every 12 hours,   R      04/01/22 2203          Data Reviewed: I have personally reviewed following labs and imaging studies CBC: Recent Labs  Lab 04/01/22 1438 04/01/22 2204 04/02/22 0058 04/02/22 0804  WBC 5.5  --   --   --   NEUTROABS 3.3  --   --   --   HGB 8.0* 7.8* 8.1* 9.0*  HCT 28.8* 27.7* 28.4* 31.2*  MCV 75.6*  --   --   --   PLT 241  --   --   --    Basic Metabolic Panel: Recent Labs  Lab 04/01/22 1438 04/02/22 0804  NA 139 138  K 4.2 4.0  CL 107 108  CO2 24 24  GLUCOSE 86 99  BUN 22 21  CREATININE 0.92 0.74  CALCIUM 8.7* 8.5*   GFR: Estimated Creatinine Clearance: 79.2 mL/min (by C-G formula based on SCr of 0.74 mg/dL). Liver Function Tests: Recent Labs  Lab 04/01/22 1438  AST 14*  ALT 13  ALKPHOS 30*  BILITOT 0.9  PROT 6.4*  ALBUMIN 3.8  Coagulation Profile: Recent Labs  Lab 04/01/22 1734 04/02/22 0804  INR 2.2* 2.2*  CBG: Recent Labs  Lab 04/01/22 1356  GLUCAP 85   Recent Results (from the past 240 hour(s))  Resp panel by RT-PCR (RSV, Flu A&B, Covid) Anterior Nasal Swab     Status: None   Collection Time: 04/01/22  2:36 PM   Specimen:  Anterior Nasal Swab  Result Value Ref Range Status   SARS Coronavirus 2 by RT PCR NEGATIVE NEGATIVE Final    Comment: (NOTE) SARS-CoV-2 target nucleic acids are NOT DETECTED.  The SARS-CoV-2 RNA is generally detectable in upper respiratory specimens during the acute phase of infection. The lowest concentration of SARS-CoV-2 viral copies this assay can detect is 138 copies/mL. A negative result does not preclude SARS-Cov-2 infection and should not be used as the sole basis for treatment or other patient management decisions. A negative result may occur with  improper specimen collection/handling, submission of specimen other than nasopharyngeal swab, presence of viral mutation(s) within the areas targeted by this assay, and inadequate number of viral  copies(<138 copies/mL). A negative result must be combined with clinical observations, patient history, and epidemiological information. The expected result is Negative.  Fact Sheet for Patients:  EntrepreneurPulse.com.au  Fact Sheet for Healthcare Providers:  IncredibleEmployment.be  This test is no t yet approved or cleared by the [place] FDA and  has been authorized for detection and/or diagnosis of SARS-CoV-2 by FDA under an Emergency Use Authorization (EUA). This EUA will remain  in effect (meaning this test can be used) for the duration of the COVID-19 declaration under Section 564(b)(1) of the Act, 21 U.S.C.section 360bbb-3(b)(1), unless the authorization is terminated  or revoked sooner.       Influenza A by PCR NEGATIVE NEGATIVE Final   Influenza B by PCR NEGATIVE NEGATIVE Final    Comment: (NOTE) The Xpert Xpress SARS-CoV-2/FLU/RSV plus assay is intended as an aid in the diagnosis of influenza from Nasopharyngeal swab specimens and should not be used as a sole basis for treatment. Nasal washings and aspirates are unacceptable for Xpert Xpress SARS-CoV-2/FLU/RSV testing.  Fact  Sheet for Patients: EntrepreneurPulse.com.au  Fact Sheet for Healthcare Providers: IncredibleEmployment.be  This test is not yet approved or cleared by the [place] FDA and has been authorized for detection and/or diagnosis of SARS-CoV-2 by FDA under an Emergency Use Authorization (EUA). This EUA will remain in effect (meaning this test can be used) for the duration of the COVID-19 declaration under Section 564(b)(1) of the Act, 21 U.S.C. section 360bbb-3(b)(1), unless the authorization is terminated or revoked.     Resp Syncytial Virus by PCR NEGATIVE NEGATIVE Final    Comment: (NOTE) Fact Sheet for Patients: EntrepreneurPulse.com.au  Fact Sheet for Healthcare Providers: IncredibleEmployment.be  This test is not yet approved or cleared by the [place] FDA and has been authorized for detection and/or diagnosis of SARS-CoV-2 by FDA under an Emergency Use Authorization (EUA). This EUA will remain in effect (meaning this test can be used) for the duration of the COVID-19 declaration under Section 564(b)(1) of the Act, 21 U.S.C. section 360bbb-3(b)(1), unless the authorization is terminated or revoked.  Performed at [facility], [place] [address], [place], [place] [address]     Antimicrobials: Anti-infectives (From admission, onward)    None      Culture/Microbiology No results found for: "SDES", "SPECREQUEST", "CULT", "REPTSTATUS"  Radiology Studies: No results found.   LOS: 0 days   Antonieta Pert, MD [facility]  04/02/2022, 11:26 AM

## 2022-04-02 NOTE — Evaluation (Signed)
Physical Therapy Evaluation Patient Details Name: Sean Brock MRN: [id number] DOB: Apr 24, 1934 Today's Date: 04/02/2022  History of Present Illness  35yof gentleman with history of aortic stenosis s/p AVR 2015, CVA, hyperlipidemia, hypertension, atrial fibrillation maintained on Coumadin, CAD, trigeminal neuralgia, peripheral arterial disease, vertigo presented with complaint of dizziness with standing, light activity and whenever he moves.  He has a history of vertigo, without associated hypotension, but his dizziness is increased significantly in the past 1 to 2 weeks.  Clinical Impression  Pt admitted with above diagnosis.  Pt currently with functional limitations due to the deficits listed below (see PT Problem List). Pt will benefit from skilled PT to increase their independence and safety with mobility to allow discharge to the venue listed below.  Pt is close to baseline walking 500' with cane, although he normally uses the cane only as needed.  If still here in a few days will see 1-2 more times to attempt ambulation without cane and possibly do stairs.  No PT needs after d/c.        Recommendations for follow up therapy are one component of a multi-disciplinary discharge planning process, led by the attending physician.  Recommendations may be updated based on patient status, additional functional criteria and insurance authorization.  Follow Up Recommendations No PT follow up      Assistance Recommended at Discharge PRN  Patient can return home with the following  A little help with walking and/or transfers;Help with stairs or ramp for entrance    Equipment Recommendations None recommended by PT  Recommendations for Other Services       Functional Status Assessment Patient has had a recent decline in their functional status and demonstrates the ability to make significant improvements in function in a reasonable and predictable amount of time.     Precautions / Restrictions  Precautions Precautions: None Restrictions Weight Bearing Restrictions: No      Mobility  Bed Mobility Overal bed mobility: Independent                  Transfers Overall transfer level: Independent                      Ambulation/Gait Ambulation/Gait assistance: Min guard, Supervision Gait Distance (Feet): 500 Feet Assistive device: Straight cane Gait Pattern/deviations: Step-through pattern       General Gait Details: Able to do quick stops for balance, but not head turns/nods due to his hx of vertigo.  At window sill worked on standing marching and demonstarted good hip ROM/strength to manage stairs.  Stairs            Wheelchair Mobility    Modified Rankin (Stroke Patients Only)       Balance Overall balance assessment: No apparent balance deficits (not formally assessed)                                           Pertinent Vitals/Pain Pain Assessment Pain Assessment: No/denies pain    Home Living Family/patient expects to be discharged to:: Private residence Living Arrangements: Spouse/significant other   Type of Home: House Home Access: Stairs to enter Entrance Stairs-Rails: Right;Left;Can reach both Entrance Stairs-Number of Steps: 6   Home Layout: Able to live on main level with bedroom/bathroom Home Equipment: Cane - single point;Adaptive equipment      Prior Function Prior Level of Function :  Independent/Modified Independent             Mobility Comments: Uses cane when out of the house       Hand Dominance        Extremity/Trunk Assessment   Upper Extremity Assessment Upper Extremity Assessment: Overall WFL for tasks assessed    Lower Extremity Assessment Lower Extremity Assessment: Overall WFL for tasks assessed    Cervical / Trunk Assessment Cervical / Trunk Assessment: Normal  Communication   Communication: No difficulties  Cognition Arousal/Alertness: Awake/alert Behavior During  Therapy: WFL for tasks assessed/performed Overall Cognitive Status: No family/caregiver present to determine baseline cognitive functioning                                          General Comments      Exercises     Assessment/Plan    PT Assessment Patient needs continued PT services  PT Problem List Decreased mobility       PT Treatment Interventions DME instruction;Gait training;Functional mobility training;Therapeutic activities;Balance training    PT Goals (Current goals can be found in the Care Plan section)  Acute Rehab PT Goals Patient Stated Goal: go home PT Goal Formulation: With patient/family Time For Goal Achievement: 04/09/22 Potential to Achieve Goals: Good    Frequency Min 3X/week     Co-evaluation               AM-PAC PT "6 Clicks" Mobility  Outcome Measure Help needed turning from your back to your side while in a flat bed without using bedrails?: None Help needed moving from lying on your back to sitting on the side of a flat bed without using bedrails?: None Help needed moving to and from a bed to a chair (including a wheelchair)?: None Help needed standing up from a chair using your arms (e.g., wheelchair or bedside chair)?: None Help needed to walk in hospital room?: A Little Help needed climbing 3-5 steps with a railing? : A Little 6 Click Score: 22    End of Session Equipment Utilized During Treatment: Gait belt Activity Tolerance: Patient tolerated treatment well Patient left: in chair;with family/visitor present;with call bell/phone within reach Nurse Communication: Mobility status PT Visit Diagnosis: Difficulty in walking, not elsewhere classified (R26.2)    Time: 5498-2641 PT Time Calculation (min) (ACUTE ONLY): 21 min   Charges:   PT Evaluation $PT Eval Low Complexity: 1 Low          Sean Brock., PT Office [phone] Acute Rehab 04/02/2022   Sean Brock 04/02/2022, 4:03 PM

## 2022-04-02 NOTE — Hospital Course (Addendum)
41yof gentleman with history of aortic stenosis s/p AVR 2015, CVA, hyperlipidemia, hypertension, atrial fibrillation maintained on Coumadin, CAD, trigeminal neuralgia, peripheral arterial disease, vertigo presented with complaint of dizziness with standing, light activity and whenever he moves.  He has a history of vertigo, without associated hypotension, but his dizziness is increased significantly in the past 1 to 2 weeks.  Blood pressure volume in 90s as low as 87- did not take coreg 1/27 am, and it persisted so brought to the ED.  He has no chest pain no shortness of breath nausea vomiting diarrhea. In the ER patient's hemoglobin is found to be 8.There are no previous hemoglobin levels for comparison.  Patient follows with [facility], and believes his CBC was done last year, we are unable to see the result.  In the ER patient is occult stool negative.  Here the patient has not been hypotensive.His presenting blood pressure was 154/73.  Admitted for further management Patient was symptomatic with generalized weakness in the setting of anemia given 1 unit PRBC hemoglobin improved.  He was also orthostatic hypotension given IV fluid.  Seen in consultation by GI did not advise inpatient workup, advised outpatient follow-up. Patient was resumed on Coumadin, IV iron ordered after discussing risk benefits alternatives he will need every dose/follow-up with PCP as outpatient.

## 2022-04-02 NOTE — Consult Note (Signed)
[facility] Gastroenterology Consult  Referring Provider: No ref. provider found Primary Care Physician:  Gaynelle Arabian, MD Primary Gastroenterologist: [facility]  Reason for Consultation: Symptomatic anemia  SUBJECTIVE:   HPI: Sean Brock is a 88 y.o. male with past medical history significant for aortic stenosis status post valve replacement in 2015, coronary artery disease status post coronary artery bypass grafting x 1 in 2015, atrial flutter status post ablation in 2020, history of CVA, gastroesophageal reflux disease, hypertension, hyperlipidemia.  Currently on Coumadin for aortic valve replacement.  Presented to [facility] on 04/01/2022 with dizziness and presyncope symptoms.  Hemoglobin on presentation 9.0, iron deficiency, baseline appears to be 13.3 on 02/24/2022.  Fecal occult blood testing was negative, BUN/creatinine 21/0.74, INR 2.2.  Colonoscopy completed on 01/25/2015 by Dr. Paulita Fujita for personal history of colon polyp with findings of diverticulosis in the ascending colon, transverse colon, descending colon and sigmoid, otherwise normal.  Colonoscopy completed on 11/09/2009 by Dr. Paulita Fujita for colorectal cancer screening with findings of 8 mm ascending colon polyp, 3 mm sigmoid polyp, diverticulosis.  No prior EGD.  Patient noted that this past week he began to have dizziness as well as shortness of breath.  He denied chest pain.  Denied nausea or vomiting.  Denied abdominal pain.  Few months prior he experienced diarrhea, though this has resolved.  He currently has brown stools with no constipation or diarrhea.  Past Medical History:  Diagnosis Date   AORTIC STENOSIS    a. Echo (05/26/13):  Mod LVH, vigorous LVF, EF 65-70%, no RWMA, Gr 1 DD, severe AS (mean 40 mmHg) => s/p bioprosthetic AVR 07/2013   Arthritis    BACK   CAD (coronary artery disease)    a. Lexiscan myoview (5/13) with EF 70%, no ischemia or infarction;   b. LHC (06/2013):  Dist LM 30%, ostial LAD 40-50%, prox  LAD 30%, ostial D1 80-90%, prox CFX 40%, RCA occluded with L-R collats => CABG (S-PDA at time of AVR)   CVA (cerebral infarction)    a. 3/14 right parietal infarct. Carotid US 3/14 showed no significant disease. He has an implanted loop recorder to look for atrial fibrillation.    DYSPEPSIA    ED (erectile dysfunction)    Former smoker    GERD (gastroesophageal reflux disease)    History of kidney stones    HYPERLIPIDEMIA    HYPERTENSION    Palpitations    a. Event monitor (5/13) with occasional PVCs, PACs but no significant arrhythmia.    S/P aortic valve replacement with bioprosthetic valve + CABG x1 07/11/2013   23 mm [facility] Ease bovine pericardial tissue valve;  Echo (08/2013):  EF 55-60%, no RWMA, AVR ok, Asc Ao 40 mm (mildly dilated), mild LAE, mild RVE, lipomatous hypertrophy, mod TR, PASP 36 mmHg   S/P CABG x 1 07/11/2013   SVG to PDA with [facility] via right thigh   Sinus bradycardia    a. 2013 - HR 40's in the office. BB stopped.   Trigeminal neuropathy    Vertigo    Past Surgical History:  Procedure Laterality Date   A-FLUTTER ABLATION N/A 11/13/2018   Procedure: A-FLUTTER ABLATION;  Surgeon: Evans Lance, MD;  Location: [place] CV [facility];  Service: Cardiovascular;  Laterality: N/A;   ABDOMINAL AORTOGRAM W/LOWER EXTREMITY N/A 02/03/2021   Procedure: ABDOMINAL AORTOGRAM W/LOWER EXTREMITY;  Surgeon: Marty Heck, MD;  Location: [place] [facility];  Service: Cardiovascular;  Laterality: N/A;   AORTIC VALVE REPLACEMENT N/A  07/11/2013   Procedure: AORTIC VALVE REPLACEMENT (AVR);  Surgeon: [name], MD;  Location: [place];  Service: Open Heart Surgery;  Laterality: N/A;   CATARACT EXTRACTION     CORONARY ARTERY BYPASS GRAFT N/A 07/11/2013   Procedure: CORONARY ARTERY BYPASS GRAFTING (CABG) x1;  Surgeon: [name], MD;  Location: [place];  Service: Open Heart Surgery;  Laterality: N/A;   INGUINAL HERNIA REPAIR Right 01/15/2013   Procedure: HERNIA REPAIR INGUINAL ADULT;   Surgeon: Harl Bowie, MD;  Location: [place];  Service: General;  Laterality: Right;   INSERTION OF MESH Right 01/15/2013   Procedure: INSERTION OF MESH;  Surgeon: Harl Bowie, MD;  Location: [place];  Service: General;  Laterality: Right;   INTRAOPERATIVE TRANSESOPHAGEAL ECHOCARDIOGRAM N/A 07/11/2013   Procedure: INTRAOPERATIVE TRANSESOPHAGEAL ECHOCARDIOGRAM;  Surgeon: [name], MD;  Location: [place];  Service: Open Heart Surgery;  Laterality: N/A;   LEFT AND RIGHT HEART CATHETERIZATION WITH CORONARY ANGIOGRAM N/A 06/06/2013   Procedure: LEFT AND RIGHT HEART CATHETERIZATION WITH CORONARY ANGIOGRAM;  Surgeon: Larey Dresser, MD;  Location: [facility] C[facility] LAB;  Service: Cardiovascular;  Laterality: N/A;   LITHOTRIPSY     LOOP RECORDER IMPLANT  04/15/2013   MDT LinQ implanred by Dr Lovena Le for cryptogenic stroke   LOOP RECORDER IMPLANT N/A 04/14/2013   Procedure: LOOP RECORDER IMPLANT;  Surgeon: Evans Lance, MD;  Location: [facility] C[facility] LAB;  Service: Cardiovascular;  Laterality: N/A;   LOOP RECORDER REMOVAL N/A 11/13/2018   Procedure: LOOP RECORDER REMOVAL;  Surgeon: Evans Lance, MD;  Location: [place] CV LAB;  Service: Cardiovascular;  Laterality: N/A;   MOUTH SURGERY     PACEMAKER IMPLANT N/A 12/09/2018   Procedure: PACEMAKER IMPLANT;  Surgeon: Evans Lance, MD;  Location: [place] [facility];  Service: Cardiovascular;  Laterality: N/A;   PERIPHERAL VASCULAR INTERVENTION Left 02/03/2021   Procedure: PERIPHERAL VASCULAR INTERVENTION;  Surgeon: Marty Heck, MD;  Location: [place] [facility];  Service: Cardiovascular;  Laterality: Left;  sfa   Prior to Admission medications   Medication Sig Start Date End Date Taking? Authorizing Provider  acetaminophen (TYLENOL) 500 MG tablet Take 500 mg by mouth every 6 (six) hours as needed for mild pain.    Yes [provider]  aspirin EC 81 MG EC tablet Take 1 tablet (81 mg total) by mouth  daily. 07/15/13  Yes Gold, Wayne E, PA-C  Carboxymethylcellul-Glycerin (CLEAR EYES FOR DRY EYES OP) Place 1 drop into both eyes daily.   Yes [provider]  carvedilol (COREG) 3.125 MG tablet Take 1 tablet (3.125 mg total) by mouth 2 (two) times daily. 12/19/18  Yes Shirley Friar, PA-C  chlorzoxazone (PARAFON) 500 MG tablet Take 500 mg by mouth 3 (three) times daily as needed for muscle spasms.   Yes [provider]  Emollient (MEDERMA AG FACE EX) Apply 1 application topically daily as needed (on face for skin cancer).   Yes [provider]  famotidine (PEPCID) 20 MG tablet Take 20 mg by mouth 2 (two) times daily.   Yes [provider]  meclizine (ANTIVERT) 25 MG tablet Take 25 mg by mouth 3 (three) times daily as needed for dizziness.   Yes [provider]  polyethylene glycol (MIRALAX / GLYCOLAX) 17 g packet Take 17 g by mouth daily as needed (constipation.).   Yes [provider]  rosuvastatin (CRESTOR) 10 MG tablet Take 10 mg by mouth every  evening.    Yes [provider]  tamsulosin (FLOMAX) 0.4 MG CAPS capsule Take 0.4 mg by mouth every evening.  03/18/15  Yes [provider]  warfarin (COUMADIN) 5 MG tablet TAKE 1/2 TO 1 (ONE-HALF TO ONE) TABLET BY MOUTH ONCE DAILY AS  DIRECTED  BY  COUMADIN  CLINIC Patient taking differently: Take 2.5-5 mg by mouth as directed. Take 1 tablet (5 mg) Every Monday Then Take 1/2 tablet (2.5 mg) ALL OTHER DAYS OF WEEK AS DIRECTED BY COUMADIN CLINIC 01/16/22  Yes Evans Lance, MD   Current Facility-Administered Medications  Medication Dose Route Frequency Provider Last Rate Last Admin   0.9 %  sodium chloride infusion   Intravenous Continuous Kc, Ramesh, MD 50 mL/hr at 04/02/22 1103 New Bag at 04/02/22 1103   acetaminophen (TYLENOL) tablet 650 mg  650 mg Oral Q6H PRN Quintella Baton, MD       Or   acetaminophen (TYLENOL) suppository 650 mg  650 mg Rectal Q6H PRN Crosley, Debby, MD        aspirin EC tablet 81 mg  81 mg Oral Daily Crosley, Debby, MD   81 mg at 04/02/22 6073   cyanocobalamin (VITAMIN B12) tablet 500 mcg  500 mcg Oral Daily Kc, Maren Beach, MD   500 mcg at 04/02/22 0907   famotidine (PEPCID) tablet 20 mg  20 mg Oral BID Quintella Baton, MD   20 mg at 04/02/22 7106   meclizine (ANTIVERT) tablet 25 mg  25 mg Oral TID PRN Quintella Baton, MD       ondansetron (ZOFRAN) tablet 4 mg  4 mg Oral Q6H PRN Crosley, Debby, MD       Or   ondansetron (ZOFRAN) injection 4 mg  4 mg Intravenous Q6H PRN Crosley, Debby, MD       pantoprazole (PROTONIX) injection 40 mg  40 mg Intravenous Q24H Crosley, Debby, MD   40 mg at 04/01/22 2235   rosuvastatin (CRESTOR) tablet 10 mg  10 mg Oral QPM Crosley, Debby, MD   10 mg at 04/01/22 2235   senna-docusate (Senokot-S) tablet 1 tablet  1 tablet Oral QHS PRN Quintella Baton, MD       Allergies as of 04/01/2022 - Review Complete 04/01/2022  Allergen Reaction Noted   Sudafed [pseudoephedrine hcl] Palpitations 03/08/2012   Cardizem [diltiazem hcl] Rash 03/08/2012   Sudafed [pseudoephedrine] Palpitations 08/04/2021   Family History  Problem Relation Age of Onset   Diabetes Mother    Heart attack Mother    Heart attack Father    Hypertension Father    Diabetes Unknown    Hypertension Unknown    Lung cancer Unknown    Cancer Sister        breast   Stroke Neg Hx    Social History   Socioeconomic History   Marital status: Married    Spouse name: Not on file   Number of children: Not on file   Years of education: Not on file   Highest education level: Not on file  Occupational History   Not on file  Tobacco Use   Smoking status: Former    Packs/day: 1.00    Years: 30.00    Total pack years: 30.00    Types: Cigarettes    Quit date: 08/29/2009    Years since quitting: 12.6   Smokeless tobacco: Never  Vaping Use   Vaping Use: Every day  Substance and Sexual Activity   Alcohol use: No   Drug use: No  Sexual activity: Not  Currently  Other Topics Concern   Not on file  Social History Narrative   He resides in [place] with his wife and son. 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  No    grandchildren. He smokes half a pack per day intermittently for the last 40    years. He drinks a glass of homemade wine mixed with diet Coke one time per    week. He denies any drugs, herbal medication, diet, or exercise program.         Social Determinants of Health   Financial Resource Strain: Not on file  Food Insecurity: Unknown (04/02/2022)   Hunger Vital Sign    Worried About Running Out of Food in the Last Year: Patient refused    [place] in the Last Year: Patient refused  Transportation Needs: No Transportation Needs (04/02/2022)   PRAPARE - Hydrologist (Medical): No    Lack of Transportation (Non-Medical): No  Physical Activity: Not on file  Stress: Not on file  Social Connections: Not on file  Intimate Partner Violence: Unknown (04/02/2022)   Humiliation, Afraid, Rape, and Kick questionnaire    Fear of Current or Ex-Partner: Patient refused    Emotionally Abused: Patient refused    Physically Abused: Patient refused    Sexually Abused: Patient refused   Review of Systems:  Review of Systems  Respiratory:  Positive for shortness of breath.   Cardiovascular:  Negative for chest pain.  Gastrointestinal:  Negative for abdominal pain, blood in stool, constipation, diarrhea, melena, nausea and vomiting.  Neurological:  Positive for dizziness.    OBJECTIVE:   Temp:  [97.5 F (36.4 C)-97.9 F (36.6 C)] 97.5 F (36.4 C) (01/28 1211) Pulse Rate:  [59-100] 60 (01/28 1211) Resp:  [10-20] 18 (01/28 1211) BP: (113-169)/(62-99) 128/67 (01/28 1211) SpO2:  [96 %-100 %] 97 % (01/28 1211) Weight:  [90.1 kg] 90.1 kg (01/28 0300) Last BM Date : 04/01/22 Physical Exam Constitutional:      General: He is not in acute distress.    Appearance:  He is not ill-appearing, toxic-appearing or diaphoretic.  Cardiovascular:     Rate and Rhythm: Normal rate. Rhythm irregular.  Pulmonary:     Effort: No respiratory distress.     Breath sounds: Normal breath sounds.  Abdominal:     General: Bowel sounds are normal. There is no distension.     Palpations: Abdomen is soft.     Tenderness: There is no abdominal tenderness. There is no guarding.  Neurological:     Mental Status: He is alert.     Labs: Recent Labs    04/01/22 1438 04/01/22 2204 04/02/22 0058 04/02/22 0804  WBC 5.5  --   --   --   HGB 8.0* 7.8* 8.1* 9.0*  HCT 28.8* 27.7* 28.4* 31.2*  PLT 241  --   --   --    BMET Recent Labs    04/01/22 1438 04/02/22 0804  NA 139 138  K 4.2 4.0  CL 107 108  CO2 24 24  GLUCOSE 86 99  BUN 22 21  CREATININE 0.92 0.74  CALCIUM 8.7* 8.5*   LFT Recent Labs    04/01/22 1438  PROT 6.4*  ALBUMIN 3.8  AST 14*  ALT 13  ALKPHOS 30*  BILITOT 0.9   PT/INR Recent Labs    04/01/22 1734 04/02/22 0804  LABPROT 24.2* 23.9*  INR 2.2* 2.2*   Diagnostic imaging: No results found.  IMPRESSION: Symptomatic anemia, iron deficient  -Fecal occult blood testing negative Personal history diverticulosis Personal history colon polyp Aortic stenosis status post valve replacement in 2015, on Coumadin, therapeutic INR Atrial flutter Coronary artery disease status post coronary artery bypass graft in 2015  PLAN: -No current signs of GI bleeding -Recommend supportive therapy for anemia including IV iron replacement -Trend H/H, transfuse for hemoglobin less than 7 -Can follow-up with Dr. Paulita Fujita in the outpatient setting to discuss endoscopy to evaluate iron deficiency anemia -Gastroenterology team will respectfully sign off and be available for any questions that arise   LOS: 0 days   Danton Clap, [facility] Gastroenterology

## 2022-04-02 NOTE — Plan of Care (Signed)
  Problem: Education: Goal: Knowledge of General Education information will improve Description: Including pain rating scale, medication(s)/side effects and non-pharmacologic comfort measures Outcome: Progressing   Problem: Health Behavior/Discharge Planning: Goal: Ability to manage health-related needs will improve Outcome: Progressing   Problem: Clinical Measurements: Goal: Ability to maintain clinical measurements within normal limits will improve Outcome: Progressing Goal: Diagnostic test results will improve Outcome: Progressing   Problem: Activity: Goal: Risk for activity intolerance will decrease Outcome: Progressing   Problem: Nutrition: Goal: Adequate nutrition will be maintained Outcome: Progressing   Problem: Coping: Goal: Level of anxiety will decrease Outcome: Progressing   Problem: Elimination: Goal: Will not experience complications related to bowel motility Outcome: Progressing   Problem: Pain Managment: Goal: General experience of comfort will improve Outcome: Progressing   Problem: Safety: Goal: Ability to remain free from injury will improve Outcome: Progressing   Problem: Skin Integrity: Goal: Risk for impaired skin integrity will decrease Outcome: Progressing

## 2022-04-03 DIAGNOSIS — R531 Weakness: Secondary | ICD-10-CM | POA: Diagnosis not present

## 2022-04-03 LAB — BPAM RBC
Blood Product Expiration Date: 202402222359
ISSUE DATE / TIME: 202401272216
Unit Type and Rh: 6200

## 2022-04-03 LAB — TYPE AND SCREEN
ABO/RH(D): A POS
Antibody Screen: NEGATIVE
Unit division: 0

## 2022-04-03 LAB — PROTIME-INR
INR: 2.3 — ABNORMAL HIGH (ref 0.8–1.2)
Prothrombin Time: 25.1 seconds — ABNORMAL HIGH (ref 11.4–15.2)

## 2022-04-03 LAB — HEMOGLOBIN AND HEMATOCRIT, BLOOD
HCT: 31.9 % — ABNORMAL LOW (ref 39.0–52.0)
Hemoglobin: 9.2 g/dL — ABNORMAL LOW (ref 13.0–17.0)

## 2022-04-03 MED ORDER — WARFARIN SODIUM 2.5 MG PO TABS: 2.5000 mg | ORAL_TABLET | Freq: Every day | ORAL | Status: DC

## 2022-04-03 MED ORDER — CARVEDILOL 3.125 MG PO TABS
3.1250 mg | ORAL_TABLET | Freq: Two times a day (BID) | ORAL | Status: DC
  Administered 2022-04-03: 3.125 mg via ORAL
  Filled 2022-04-03: qty 1

## 2022-04-03 MED ORDER — SODIUM CHLORIDE 0.9 % IV SOLN
250.0000 mg | Freq: Once | INTRAVENOUS | Status: AC
  Administered 2022-04-03: 250 mg via INTRAVENOUS
  Filled 2022-04-03: qty 20

## 2022-04-03 MED ORDER — WARFARIN - PHARMACIST DOSING INPATIENT: Freq: Every day | Status: DC

## 2022-04-03 NOTE — Progress Notes (Unsigned)
Cardiology Office Note Date:  04/03/2022  Patient ID:  Sean, Brock 1934-11-03, MRN [id number] PCP:  Gaynelle Arabian, MD  Electrophysiologist: Dr. Lovena Le     Chief Complaint:   58mo History of Present Illness: Sean MORILLOis a 88y.o. male with history of CABG (CABG 2015), AVR (bioprosthetic, 2015), AFib/Flutter (ablated 11/13/2018), sinus node dysfunction w/PPM, stroke, HTN, HLD  He comes today to be seen for Dr. TLovena Le last seen by him jan 2021, occ dizziness, minimal palpitations, no symptoms otherwise, no changes were made.  He saw [facility] for evaluation of escalating b/l symptoms of claudication, ABIs on 01/11/2021 are 1.06 on the right and 1.16 on the left but monophasic waveforms throughout bilateral lower extremities, planned for transfemoral access with aortogram, lower extremity arteriogram, and possible intervention.  Pending Abdominal aortogram/runoff possible intervention, vascular requested hold of warfarin and bridge, [place], planned to hold 3 days, bridge with lovenox was planned at his 11/21 visit  Does not appear that they are requesting cardiology/medical clearance.   I saw him 02/01/22 He comes accompanied by his wife He denies any cardiac concerns, says he could walk and do whatever he wanted if it wer not for the pain in his legs. No CP, palpitations or cardiac awareness No  dizzy spells, near syncope or syncope Warfarin management has been good, no wide swings No bleeding or signs of bleeding They are clear on the lovenox instructions with no questions Felt an acceptable surgical cardiac risk  At his device check noted I had clear capture at 1.3/0.4 and 1.0/1.0 With programmed outputs at 2.6/0.4 AND 2.0/1.0 provokes the AT, these are not PMT are slower then MTR This ONLY occurs at increased A lead outputs His wife recalled after implant of his device that he had a terrible time with palpitations and even hospitalized his device  programmed numerous times until finally the settings found that worked well for him He has a >1V safety margin on his A lead (unchanged from last check)  I saw him 09/30/21 He comes with his wife Cardiac-wise he denies any CP, palpitations or cardiac awareness No SOB He has been having dizzy spells, apparently now about a year, no near syncope or syncope. Typically he gets dizzy upon standing up, or when he comes up after bending over, and bright light can trigger it too. This is a feeling not of spinning, but dizzy/off balance, does not feel like he is going to faint Generally pretty transient lasting a few seconds He also reports when walking sometime his legs feel very weak, suddenly and will have to sit, not painful Pending his f/u with vascular. He has dicussed his symptoms with his PMD, not clear that there was any specific thoughts towards cause, but seems he was told he has arthritis in his back as part of the cause perhaps.  Orthostatics not particularly abnormal, unable to reproduce With A threshold testing provoked an ATach, this known for him, and pt aware of it Thresholds stable Low AF/flutter burden  Admitted 04/01/22 with dizziness, low BPs at home, found anemic, 1uPRBC transfused, IV iron started, FOB negative, GI recommended out pt follow up Discharged 04/03/22 back on warfarin To f/u with GI and PMD D/c   H/H 9.1/31.9   TODAY He is accompanied by his wife. Orthostatic dizziness worse with the anemia, he has not been taking the coreg with BP dipping to 80's-90 range when standing No CP, palpitations A little winded with exertion  with anemia They are waiting on his PMD office to get back to them for an appt, +/- iron infusions plan as well. His wife is hoping they do continue the iron.  She did get from the PMD office that may 2023 his last Hgb was 11.6 INR had been stable    Device information BSCi dual chamber PPM implanted 12/09/2018   Past Medical History:   Diagnosis Date   AORTIC STENOSIS    a. Echo (05/26/13):  Mod LVH, vigorous LVF, EF 65-70%, no RWMA, Gr 1 DD, severe AS (mean 40 mmHg) => s/p bioprosthetic AVR 07/2013   Arthritis    BACK   CAD (coronary artery disease)    a. Lexiscan myoview (5/13) with EF 70%, no ischemia or infarction;   b. LHC (06/2013):  Dist LM 30%, ostial LAD 40-50%, prox LAD 30%, ostial D1 80-90%, prox CFX 40%, RCA occluded with L-R collats => CABG (S-PDA at time of AVR)   CVA (cerebral infarction)    a. 3/14 right parietal infarct. Carotid US 3/14 showed no significant disease. He has an implanted loop recorder to look for atrial fibrillation.    DYSPEPSIA    ED (erectile dysfunction)    Former smoker    GERD (gastroesophageal reflux disease)    History of kidney stones    HYPERLIPIDEMIA    HYPERTENSION    Palpitations    a. Event monitor (5/13) with occasional PVCs, PACs but no significant arrhythmia.    S/P aortic valve replacement with bioprosthetic valve + CABG x1 07/11/2013   23 mm [facility] Ease bovine pericardial tissue valve;  Echo (08/2013):  EF 55-60%, no RWMA, AVR ok, Asc Ao 40 mm (mildly dilated), mild LAE, mild RVE, lipomatous hypertrophy, mod TR, PASP 36 mmHg   S/P CABG x 1 07/11/2013   SVG to PDA with [facility] via right thigh   Sinus bradycardia    a. 2013 - HR 40's in the office. BB stopped.   Trigeminal neuropathy    Vertigo     Past Surgical History:  Procedure Laterality Date   A-FLUTTER ABLATION N/A 11/13/2018   Procedure: A-FLUTTER ABLATION;  Surgeon: Evans Lance, MD;  Location: [place] [facility];  Service: Cardiovascular;  Laterality: N/A;   ABDOMINAL AORTOGRAM W/LOWER EXTREMITY N/A 02/03/2021   Procedure: ABDOMINAL AORTOGRAM W/LOWER EXTREMITY;  Surgeon: Marty Heck, MD;  Location: [place] [facility];  Service: Cardiovascular;  Laterality: N/A;   AORTIC VALVE REPLACEMENT N/A 07/11/2013   Procedure: AORTIC VALVE REPLACEMENT (AVR);  Surgeon: [name], MD;  Location: [place];   Service: Open Heart Surgery;  Laterality: N/A;   CATARACT EXTRACTION     CORONARY ARTERY BYPASS GRAFT N/A 07/11/2013   Procedure: CORONARY ARTERY BYPASS GRAFTING (CABG) x1;  Surgeon: [name], MD;  Location: [place];  Service: Open Heart Surgery;  Laterality: N/A;   INGUINAL HERNIA REPAIR Right 01/15/2013   Procedure: HERNIA REPAIR INGUINAL ADULT;  Surgeon: Harl Bowie, MD;  Location: [place];  Service: General;  Laterality: Right;   INSERTION OF MESH Right 01/15/2013   Procedure: INSERTION OF MESH;  Surgeon: Harl Bowie, MD;  Location: [place];  Service: General;  Laterality: Right;   INTRAOPERATIVE TRANSESOPHAGEAL ECHOCARDIOGRAM N/A 07/11/2013   Procedure: INTRAOPERATIVE TRANSESOPHAGEAL ECHOCARDIOGRAM;  Surgeon: [name], MD;  Location: [place];  Service: Open Heart Surgery;  Laterality: N/A;   LEFT AND RIGHT HEART CATHETERIZATION WITH CORONARY ANGIOGRAM N/A 06/06/2013  Procedure: LEFT AND RIGHT HEART CATHETERIZATION WITH CORONARY ANGIOGRAM;  Surgeon: Larey Dresser, MD;  Location: [facility] C[facility] LAB;  Service: Cardiovascular;  Laterality: N/A;   LITHOTRIPSY     LOOP RECORDER IMPLANT  04/15/2013   MDT LinQ implanred by Dr Lovena Le for cryptogenic stroke   LOOP RECORDER IMPLANT N/A 04/14/2013   Procedure: LOOP RECORDER IMPLANT;  Surgeon: Evans Lance, MD;  Location: [facility] C[facility] LAB;  Service: Cardiovascular;  Laterality: N/A;   LOOP RECORDER REMOVAL N/A 11/13/2018   Procedure: LOOP RECORDER REMOVAL;  Surgeon: Evans Lance, MD;  Location: [place] [facility];  Service: Cardiovascular;  Laterality: N/A;   MOUTH SURGERY     PACEMAKER IMPLANT N/A 12/09/2018   Procedure: PACEMAKER IMPLANT;  Surgeon: Evans Lance, MD;  Location: [place] [facility];  Service: Cardiovascular;  Laterality: N/A;   PERIPHERAL VASCULAR INTERVENTION Left 02/03/2021   Procedure: PERIPHERAL VASCULAR INTERVENTION;  Surgeon: Marty Heck, MD;  Location: [place] [facility];   Service: Cardiovascular;  Laterality: Left;  sfa    No current facility-administered medications for this visit.   No current outpatient medications on file.   Facility-Administered Medications Ordered in Other Visits  Medication Dose Route Frequency Provider Last Rate Last Admin   0.9 %  sodium chloride infusion   Intravenous Continuous Kc, Ramesh, MD 50 mL/hr at 04/02/22 1103 New Bag at 04/02/22 1103   acetaminophen (TYLENOL) tablet 650 mg  650 mg Oral Q6H PRN Crosley, Debby, MD       Or   acetaminophen (TYLENOL) suppository 650 mg  650 mg Rectal Q6H PRN Crosley, Debby, MD       aspirin EC tablet 81 mg  81 mg Oral Daily Crosley, Debby, MD   81 mg at 04/02/22 5809   cyanocobalamin (VITAMIN B12) tablet 500 mcg  500 mcg Oral Daily Kc, Ramesh, MD   500 mcg at 04/02/22 0907   famotidine (PEPCID) tablet 20 mg  20 mg Oral BID Quintella Baton, MD   20 mg at 04/02/22 2204   meclizine (ANTIVERT) tablet 25 mg  25 mg Oral TID PRN Quintella Baton, MD       ondansetron (ZOFRAN) tablet 4 mg  4 mg Oral Q6H PRN Crosley, Debby, MD       Or   ondansetron (ZOFRAN) injection 4 mg  4 mg Intravenous Q6H PRN Crosley, Debby, MD       pantoprazole (PROTONIX) injection 40 mg  40 mg Intravenous Q24H Crosley, Debby, MD   40 mg at 04/02/22 2204   rosuvastatin (CRESTOR) tablet 10 mg  10 mg Oral QPM Crosley, Debby, MD   10 mg at 04/02/22 1702   senna-docusate (Senokot-S) tablet 1 tablet  1 tablet Oral QHS PRN Quintella Baton, MD        Allergies:   Sudafed [pseudoephedrine hcl], Cardizem [diltiazem hcl], and Sudafed [pseudoephedrine]   Social History:  The patient  reports that he quit smoking about 12 years ago. His smoking use included cigarettes. He has a 30.00 pack-year smoking history. He has never used smokeless tobacco. He reports that he does not drink alcohol and does not use drugs.   Family History:  The patient's family history includes Cancer in his sister; Diabetes in his mother and unknown relative; Heart  attack in his father and mother; Hypertension in his father and unknown relative; Lung cancer in his unknown relative.  ROS:  Please see the history of present illness.    All other systems are reviewed  and otherwise negative.   PHYSICAL EXAM:  VS:  There were no vitals taken for this visit. BMI: There is no height or weight on file to calculate BMI. Well nourished, well developed, in no acute distress HEENT: normocephalic, atraumatic Neck: no JVD, carotid bruits or masses Cardiac:  RRR; no significant murmurs, no rubs, or gallops Lungs: CTA b/l, no wheezing, rhonchi or rales Abd: soft, nontender MS: no deformity, age appropriate, perhaps advanced atrophy Ext: no edema Skin: warm and dry, no rash Neuro:  No gross deficits appreciated Psych: euthymic mood, full affect  PPM site is stable, no tethering or discomfort, devise is prominent though no thinning   EKG:  not done today  Device interrogation done today and reviewed by myself:   Known for him:  Interestingly with A lead thresholds provokes very brief ATach episodes that he is aware of I had clear capture at 1.3/0.4 and 1.0/1.0 His wife recalls after implant of his device that he had a terrible time with palpitations and even hospitalized his device programmed numerous times until finally the settings found that worked wel for him  TODAY Battery and lead measurements are stable One AFlutter in Dec was 19 second Presenting today is AP/VS Noting rhythm IQ episodes AP 36% VP 71%   11/13/2018: EPS/ablation CONCLUSIONS:  1. Isthmus-dependent right atrial flutter upon presentation.  2. Successful radiofrequency ablation of atrial flutter along the cavotricuspid isthmus with complete bidirectional isthmus block achieved utilizing 3D electroanatomic mapping to guide ablation.  3. No inducible arrhythmias following ablation.  4. Successful removal of a medtronic ILR 5. No early apparent complications.    08/25/2013:  TTE Study Conclusions  - Left ventricle: The cavity size was normal. Systolic function was    normal. The estimated ejection fraction was in the range of 55%    to 60%. Wall motion was normal; there were no regional wall    motion abnormalities.  - Aortic valve: A bioprosthesis was present and functioning    normally.  - Aorta: Ascending aortic diameter: 40 mm (S).  - Ascending aorta: The ascending aorta was mildly dilated.  - Mitral valve: Calcified annulus. There was trivial regurgitation.  - Left atrium: The atrium was mildly dilated.  - Right ventricle: The cavity size was mildly dilated. Wall    thickness was normal.  - Atrial septum: There was increased thickness of the septum,    consistent with lipomatous hypertrophy.  - Tricuspid valve: There was moderate regurgitation.  - Pulmonary arteries: PA peak pressure: 36 mm Hg (S).    Recent Labs: 04/01/2022: ALT 13; Platelets 241 04/02/2022: BUN 21; Creatinine, Ser 0.74; Hemoglobin 8.2; Potassium 4.0; Sodium 138  No results found for requested labs within last 365 days.   Estimated Creatinine Clearance: 79.2 mL/min (by C-G formula based on SCr of 0.74 mg/dL).   Wt Readings from Last 3 Encounters:  04/02/22 198 lb 10.2 oz (90.1 kg)  12/20/21 198 lb (89.8 kg)  10/21/21 195 lb 8 oz (88.7 kg)     Other studies reviewed: Additional studies/records reviewed today include: summarized above  ASSESSMENT AND PLAN:  CAD No anginal symptoms On ASA, BB statin  AVR (bioprsothetic) No symptoms Soft SM only on exam  Update his echo  PPM Intact function no programming changes made  HTN Looks OK Currently not taking his coreg with anemia provoking his orthostatic dizziness/low BP Encouraged adequate hydration Advised to take coreg when SBP was > 130  HLD Labs are monitored/managed with his PMD  AFlutter Ablated, burden low, <1%  On warfarin, monitored and managed with his PMD Recent hospitalization with symptomatic  anemia, unclear etiology, negative FOB Started on IV iron in the hospital, pending f/u with PMD They inquire about if he needs to continue warfarin Await completion of his anemia w/u   7 Dizziness Orthostatic sounding, with home BP reading standing 62'I-29 Systolic Worse with anemia Encouraged adequate hydration Advised/discussed safety Parameters on his coreg as discussed above   Disposition: back in 2 mo, sooner if needed, will plan to have him see Dr. Lovena Le  Current medicines are reviewed at length with the patient today.  The patient did not have any concerns regarding medicines.  Venetia Night, PA-C 04/03/2022 7:07 AM     [address] [place] [address] [phone] (office)  [phone] (fax)

## 2022-04-03 NOTE — Discharge Summary (Signed)
Physician Discharge Summary  Sean Brock WVP:[id number] DOB: 01-16-1935 DOA: 04/01/2022  PCP: Gaynelle Arabian, MD  Admit date: 04/01/2022 Discharge date: 04/03/2022 Recommendations for Outpatient Follow-up:  Follow up with PCP in 1 weeks-call for appointment GU as outpatient Please obtain BMP/CBC in one week  Discharge Dispo: Home Discharge Condition: Stable Code Status:   Code Status: Full Code Diet recommendation:  Diet Order             Diet Heart Room service appropriate? Yes; Fluid consistency: Thin  Diet effective now                    Brief/Interim Summary: 59yof gentleman with history of aortic stenosis s/p AVR 2015, CVA, hyperlipidemia, hypertension, atrial fibrillation maintained on Coumadin, CAD, trigeminal neuralgia, peripheral arterial disease, vertigo presented with complaint of dizziness with standing, light activity and whenever he moves.  He has a history of vertigo, without associated hypotension, but his dizziness is increased significantly in the past 1 to 2 weeks.  Blood pressure volume in 90s as low as 87- did not take coreg 1/27 am, and it persisted so brought to the ED.  He has no chest pain no shortness of breath nausea vomiting diarrhea. In the ER patient's hemoglobin is found to be 8.There are no previous hemoglobin levels for comparison.  Patient follows with [facility], and believes his CBC was done last year, we are unable to see the result.  In the ER patient is occult stool negative.  Here the patient has not been hypotensive.His presenting blood pressure was 154/73.  Admitted for further management Patient was symptomatic with generalized weakness in the setting of anemia given 1 unit PRBC hemoglobin improved.  He was also orthostatic hypotension given IV fluid.  Seen in consultation by GI did not advise inpatient workup, advised outpatient follow-up. Patient was resumed on Coumadin, IV iron ordered after discussing risk benefits alternatives he will need  every dose/follow-up with PCP as outpatient.    Discharge Diagnoses:  Principal Problem:   General weakness Active Problems:   Pure hypercholesterolemia   Essential hypertension   CAD (coronary artery disease)   S/P aortic valve replacement with bioprosthetic valve + CABG x1   Atrial fibrillation [I48.91]   Pacemaker   Symptomatic anemia   H/O: CVA (cerebrovascular accident)   Generalized weakness   Generalized weakness: multifactorial due to anemia orthostatic hypotension and co-morbodities. Cont t oaddress anemia, Orthostasis. PTOT eal.  Gentle hydration for orthostatic hypotension.  BP remained stable this morning.  Ambulating in room and and no complaint  Symptomatic anemia-iron deficiency:Status post 1 unit PRBC overnight.Hemoccult negative.  Anemia panel shows iron deficiency anemia-ferritin 5, B12 borderline.  Post transfusional H&H is stable monitor.  GI has seen the patient no further inpatient workup.  Hemoccult negative.  Coumadin resumed.  Patient interested to receive IV iron prior to discharge after explained risk benefits alternative > Follow-up with PCP for further dose of IV iron as outpatient-agreeable  Dizziness Orthostatic hypotension: BP stable.  Given IV fluid hydration he is ambulating without any problems today.  Coreg resumed.  History of CVA CAD/PAD: No chest pain.  Continue statin, ASA, anticoagulation.  Atrial fibrillation:Rate controlled.On Coumadin INR therapeutic pharmacy to manage.  Low-dose Coreg 3.12 at home- resumed.INR remains therapeutic - resumed coumadin Recent Labs  Lab 04/01/22 1734 04/02/22 0804 04/03/22 0631  INR 2.2* 2.2* 2.3*      Consults: GI Subjective: Alert awake oriented resting comfortably ambulatory.  No complaints stool was brown  this morning  Discharge Exam: Vitals:   04/03/22 0444 04/03/22 1157  BP: (!) 131/95 121/70  Pulse: 98 66  Resp: 16   Temp: 98.3 F (36.8 C)   SpO2: 98%    General: Pt is alert, awake,  not in acute distress Cardiovascular: RRR, S1/S2 +, no rubs, no gallops Respiratory: CTA bilaterally, no wheezing, no rhonchi Abdominal: Soft, NT, ND, bowel sounds + Extremities: no edema, no cyanosis  Discharge Instructions  Discharge Instructions     Discharge instructions   Complete by: As directed    CBC check in 1 week from PCP.  Follow-up with gastroenterology as outpatient.  Please call call MD or return to ER for similar or worsening recurring problem that brought you to hospital or if any fever,nausea/vomiting,abdominal pain, uncontrolled pain, chest pain,  shortness of breath or any other alarming symptoms.  Please follow-up your doctor as instructed in a week time and call the office for appointment.  Please avoid alcohol, smoking, or any other illicit substance and maintain healthy habits including taking your regular medications as prescribed.  You were cared for by a hospitalist during your hospital stay. If you have any questions about your discharge medications or the care you received while you were in the hospital after you are discharged, you can call the unit and ask to speak with the hospitalist on call if the hospitalist that took care of you is not available.  Once you are discharged, your primary care physician will handle any further medical issues. Please note that NO REFILLS for any discharge medications will be authorized once you are discharged, as it is imperative that you return to your primary care physician (or establish a relationship with a primary care physician if you do not have one) for your aftercare needs so that they can reassess your need for medications and monitor your lab values   Increase activity slowly   Complete by: As directed       Allergies as of 04/03/2022       Reactions   Sudafed [pseudoephedrine Hcl] Palpitations   Cardizem [diltiazem Hcl] Rash   Sudafed [pseudoephedrine] Palpitations        Medication List     TAKE  these medications    acetaminophen 500 MG tablet Commonly known as: TYLENOL Take 500 mg by mouth every 6 (six) hours as needed for mild pain.   aspirin EC 81 MG tablet Take 1 tablet (81 mg total) by mouth daily.   carvedilol 3.125 MG tablet Commonly known as: COREG Take 1 tablet (3.125 mg total) by mouth 2 (two) times daily.   chlorzoxazone 500 MG tablet Commonly known as: PARAFON Take 500 mg by mouth 3 (three) times daily as needed for muscle spasms.   CLEAR EYES FOR DRY EYES OP Place 1 drop into both eyes daily.   famotidine 20 MG tablet Commonly known as: PEPCID Take 20 mg by mouth 2 (two) times daily.   meclizine 25 MG tablet Commonly known as: ANTIVERT Take 25 mg by mouth 3 (three) times daily as needed for dizziness.   MEDERMA AG FACE EX Apply 1 application topically daily as needed (on face for skin cancer).   polyethylene glycol 17 g packet Commonly known as: MIRALAX / GLYCOLAX Take 17 g by mouth daily as needed (constipation.).   rosuvastatin 10 MG tablet Commonly known as: CRESTOR Take 10 mg by mouth every evening.   tamsulosin 0.4 MG Caps capsule Commonly known as: FLOMAX Take 0.4 mg by  mouth every evening.   warfarin 5 MG tablet Commonly known as: COUMADIN Take as directed. If you are unsure how to take this medication, talk to your nurse or doctor. Original instructions: TAKE 1/2 TO 1 (ONE-HALF TO ONE) TABLET BY MOUTH ONCE DAILY AS  DIRECTED  BY  COUMADIN  CLINIC What changed: See the new instructions.        Follow-up Information     Gaynelle Arabian, MD Follow up in 1 week(s).   Specialty: Family Medicine Contact information: [address], [place] [address] [phone]         Tanaina, DO Follow up in 1 week(s).   Specialty: Gastroenterology Contact information: [place] [address] [phone]                Allergies  Allergen Reactions   Sudafed [Pseudoephedrine  Hcl] Palpitations   Cardizem [Diltiazem Hcl] Rash   Sudafed [Pseudoephedrine] Palpitations    The results of significant diagnostics from this hospitalization (including imaging, microbiology, ancillary and laboratory) are listed below for reference.    Microbiology: Recent Results (from the past 240 hour(s))  Resp panel by RT-PCR (RSV, Flu A&B, Covid) Anterior Nasal Swab     Status: None   Collection Time: 04/01/22  2:36 PM   Specimen: Anterior Nasal Swab  Result Value Ref Range Status   SARS Coronavirus 2 by RT PCR NEGATIVE NEGATIVE Final    Comment: (NOTE) SARS-CoV-2 target nucleic acids are NOT DETECTED.  The SARS-CoV-2 RNA is generally detectable in upper respiratory specimens during the acute phase of infection. The lowest concentration of SARS-CoV-2 viral copies this assay can detect is 138 copies/mL. A negative result does not preclude SARS-Cov-2 infection and should not be used as the sole basis for treatment or other patient management decisions. A negative result may occur with  improper specimen collection/handling, submission of specimen other than nasopharyngeal swab, presence of viral mutation(s) within the areas targeted by this assay, and inadequate number of viral copies(<138 copies/mL). A negative result must be combined with clinical observations, patient history, and epidemiological information. The expected result is Negative.  Fact Sheet for Patients:  EntrepreneurPulse.com.au  Fact Sheet for Healthcare Providers:  IncredibleEmployment.be  This test is no t yet approved or cleared by the [place] FDA and  has been authorized for detection and/or diagnosis of SARS-CoV-2 by FDA under an Emergency Use Authorization (EUA). This EUA will remain  in effect (meaning this test can be used) for the duration of the COVID-19 declaration under Section 564(b)(1) of the Act, 21 U.S.C.section 360bbb-3(b)(1), unless the  authorization is terminated  or revoked sooner.       Influenza A by PCR NEGATIVE NEGATIVE Final   Influenza B by PCR NEGATIVE NEGATIVE Final    Comment: (NOTE) The Xpert Xpress SARS-CoV-2/FLU/RSV plus assay is intended as an aid in the diagnosis of influenza from Nasopharyngeal swab specimens and should not be used as a sole basis for treatment. Nasal washings and aspirates are unacceptable for Xpert Xpress SARS-CoV-2/FLU/RSV testing.  Fact Sheet for Patients: EntrepreneurPulse.com.au  Fact Sheet for Healthcare Providers: IncredibleEmployment.be  This test is not yet approved or cleared by the [place] FDA and has been authorized for detection and/or diagnosis of SARS-CoV-2 by FDA under an Emergency Use Authorization (EUA). This EUA will remain in effect (meaning this test can be used) for the duration of the COVID-19 declaration under Section 564(b)(1) of the Act, 21 U.S.C.  section 360bbb-3(b)(1), unless the authorization is terminated or revoked.     Resp Syncytial Virus by PCR NEGATIVE NEGATIVE Final    Comment: (NOTE) Fact Sheet for Patients: EntrepreneurPulse.com.au  Fact Sheet for Healthcare Providers: IncredibleEmployment.be  This test is not yet approved or cleared by the [place] FDA and has been authorized for detection and/or diagnosis of SARS-CoV-2 by FDA under an Emergency Use Authorization (EUA). This EUA will remain in effect (meaning this test can be used) for the duration of the COVID-19 declaration under Section 564(b)(1) of the Act, 21 U.S.C. section 360bbb-3(b)(1), unless the authorization is terminated or revoked.  Performed at [facility], [place] [address] [place], [place] [address]     Procedures/Studies: CUP PACEART REMOTE DEVICE CHECK  Result Date: 03/28/2022 Scheduled remote reviewed. Normal device function.  1 ATR, 19 secs.  Next remote 91  days- JJB   Labs: BNP (last 3 results) No results for input(s): "BNP" in the last 8760 hours. Basic Metabolic Panel: Recent Labs  Lab 04/01/22 1438 04/02/22 0804  NA 139 138  K 4.2 4.0  CL 107 108  CO2 24 24  GLUCOSE 86 99  BUN 22 21  CREATININE 0.92 0.74  CALCIUM 8.7* 8.5*   Liver Function Tests: Recent Labs  Lab 04/01/22 1438  AST 14*  ALT 13  ALKPHOS 30*  BILITOT 0.9  PROT 6.4*  ALBUMIN 3.8   No results for input(s): "LIPASE", "AMYLASE" in the last 168 hours. No results for input(s): "AMMONIA" in the last 168 hours. CBC: Recent Labs  Lab 04/01/22 1438 04/01/22 2204 04/02/22 0058 04/02/22 0804 04/02/22 2248 04/03/22 0631  WBC 5.5  --   --   --   --   --   NEUTROABS 3.3  --   --   --   --   --   HGB 8.0* 7.8* 8.1* 9.0* 8.2* 9.2*  HCT 28.8* 27.7* 28.4* 31.2* 29.1* 31.9*  MCV 75.6*  --   --   --   --   --   PLT 241  --   --   --   --   --    Cardiac Enzymes: No results for input(s): "CKTOTAL", "CKMB", "CKMBINDEX", "TROPONINI" in the last 168 hours. BNP: Invalid input(s): "POCBNP" CBG: Recent Labs  Lab 04/01/22 1356  GLUCAP 85   D-Dimer No results for input(s): "DDIMER" in the last 72 hours. Hgb A1c No results for input(s): "HGBA1C" in the last 72 hours. Lipid Profile No results for input(s): "CHOL", "HDL", "LDLCALC", "TRIG", "CHOLHDL", "LDLDIRECT" in the last 72 hours. Thyroid function studies No results for input(s): "TSH", "T4TOTAL", "T3FREE", "THYROIDAB" in the last 72 hours.  Invalid input(s): "FREET3" Anemia work up Recent Labs    04/01/22 2203 04/01/22 2204  VITAMINB12 186  --   FOLATE 13.5  --   FERRITIN 5*  --   TIBC 491*  --   IRON 20*  --   RETICCTPCT  --  1.2   Urinalysis    Component Value Date/Time   COLORURINE YELLOW 02/02/2015 [place] 02/02/2015 1408   LABSPEC 1.016 02/02/2015 1408   PHURINE 6.0 02/02/2015 1408   GLUCOSEU NEGATIVE 02/02/2015 1408   HGBUR NEGATIVE 02/02/2015 1408   BILIRUBINUR  NEGATIVE 02/02/2015 1408   KETONESUR NEGATIVE 02/02/2015 [place] 02/02/2015 1408   UROBILINOGEN 1.0 07/07/2013 1338   NITRITE NEGATIVE 02/02/2015 1408   LEUKOCYTESUR NEGATIVE 02/02/2015 1408   Sepsis Labs Recent Labs  Lab  04/01/22 1438  WBC 5.5   Microbiology Recent Results (from the past 240 hour(s))  Resp panel by RT-PCR (RSV, Flu A&B, Covid) Anterior Nasal Swab     Status: None   Collection Time: 04/01/22  2:36 PM   Specimen: Anterior Nasal Swab  Result Value Ref Range Status   SARS Coronavirus 2 by RT PCR NEGATIVE NEGATIVE Final    Comment: (NOTE) SARS-CoV-2 target nucleic acids are NOT DETECTED.  The SARS-CoV-2 RNA is generally detectable in upper respiratory specimens during the acute phase of infection. The lowest concentration of SARS-CoV-2 viral copies this assay can detect is 138 copies/mL. A negative result does not preclude SARS-Cov-2 infection and should not be used as the sole basis for treatment or other patient management decisions. A negative result may occur with  improper specimen collection/handling, submission of specimen other than nasopharyngeal swab, presence of viral mutation(s) within the areas targeted by this assay, and inadequate number of viral copies(<138 copies/mL). A negative result must be combined with clinical observations, patient history, and epidemiological information. The expected result is Negative.  Fact Sheet for Patients:  EntrepreneurPulse.com.au  Fact Sheet for Healthcare Providers:  IncredibleEmployment.be  This test is no t yet approved or cleared by the [place] FDA and  has been authorized for detection and/or diagnosis of SARS-CoV-2 by FDA under an Emergency Use Authorization (EUA). This EUA will remain  in effect (meaning this test can be used) for the duration of the COVID-19 declaration under Section 564(b)(1) of the Act, 21 U.S.C.section 360bbb-3(b)(1),  unless the authorization is terminated  or revoked sooner.       Influenza A by PCR NEGATIVE NEGATIVE Final   Influenza B by PCR NEGATIVE NEGATIVE Final    Comment: (NOTE) The Xpert Xpress SARS-CoV-2/FLU/RSV plus assay is intended as an aid in the diagnosis of influenza from Nasopharyngeal swab specimens and should not be used as a sole basis for treatment. Nasal washings and aspirates are unacceptable for Xpert Xpress SARS-CoV-2/FLU/RSV testing.  Fact Sheet for Patients: EntrepreneurPulse.com.au  Fact Sheet for Healthcare Providers: IncredibleEmployment.be  This test is not yet approved or cleared by the [place] FDA and has been authorized for detection and/or diagnosis of SARS-CoV-2 by FDA under an Emergency Use Authorization (EUA). This EUA will remain in effect (meaning this test can be used) for the duration of the COVID-19 declaration under Section 564(b)(1) of the Act, 21 U.S.C. section 360bbb-3(b)(1), unless the authorization is terminated or revoked.     Resp Syncytial Virus by PCR NEGATIVE NEGATIVE Final    Comment: (NOTE) Fact Sheet for Patients: EntrepreneurPulse.com.au  Fact Sheet for Healthcare Providers: IncredibleEmployment.be  This test is not yet approved or cleared by the [place] FDA and has been authorized for detection and/or diagnosis of SARS-CoV-2 by FDA under an Emergency Use Authorization (EUA). This EUA will remain in effect (meaning this test can be used) for the duration of the COVID-19 declaration under Section 564(b)(1) of the Act, 21 U.S.C. section 360bbb-3(b)(1), unless the authorization is terminated or revoked.  Performed at [facility], [place] [address], [place], [place] [address]   Time coordinating discharge: 25 minutes  SIGNED: Antonieta Pert, MD  [facility] 04/03/2022, 1:15 PM  If 7PM-7AM, please contact  night-coverage [email or web address]

## 2022-04-03 NOTE — Plan of Care (Signed)
  Problem: Education: Goal: Knowledge of General Education information will improve Description: Including pain rating scale, medication(s)/side effects and non-pharmacologic comfort measures Outcome: Progressing   Problem: Health Behavior/Discharge Planning: Goal: Ability to manage health-related needs will improve Outcome: Progressing   Problem: Clinical Measurements: Goal: Ability to maintain clinical measurements within normal limits will improve Outcome: Progressing   Problem: Activity: Goal: Risk for activity intolerance will decrease Outcome: Progressing   Problem: Nutrition: Goal: Adequate nutrition will be maintained Outcome: Progressing   Problem: Coping: Goal: Level of anxiety will decrease Outcome: Progressing   Problem: Elimination: Goal: Will not experience complications related to bowel motility Outcome: Progressing   Problem: Safety: Goal: Ability to remain free from injury will improve Outcome: Progressing

## 2022-04-03 NOTE — Progress Notes (Signed)
  Transition of Care [facility]) Screening Note   Patient Details  Name: Sean Brock Date of Birth: [birth year]   [facility] of [facility]) CM/SW Contact:    Vassie Moselle, LCSW Phone Number: 04/03/2022, 9:06 AM    Transition of Care Department [facility]) has reviewed patient and no TOC needs have been identified at this time. We will continue to monitor patient advancement through interdisciplinary progression rounds. If new patient transition needs arise, please place a TOC consult.

## 2022-04-03 NOTE — Progress Notes (Signed)
ANTICOAGULATION CONSULT NOTE - Initial Consult  Pharmacy Consult for warfarin Indication: atrial fibrillation  Allergies  Allergen Reactions   Sudafed [Pseudoephedrine Hcl] Palpitations   Cardizem [Diltiazem Hcl] Rash   Sudafed [Pseudoephedrine] Palpitations    Patient Measurements: Height: '6\' 3"'$  (190.5 cm) Weight: 90.1 kg (198 lb 10.2 oz) IBW/kg (Calculated) : 84.5 Heparin Dosing Weight:   Vital Signs: Temp: 98.3 F (36.8 C) (01/29 0444) Temp Source: Oral (01/29 0444) BP: 131/95 (01/29 0444) Pulse Rate: 98 (01/29 0444)  Labs: Recent Labs    04/01/22 1438 04/01/22 1734 04/01/22 2204 04/02/22 0804 04/02/22 2248 04/03/22 0631  HGB 8.0*  --    < > 9.0* 8.2* 9.2*  HCT 28.8*  --    < > 31.2* 29.1* 31.9*  PLT 241  --   --   --   --   --   LABPROT  --  24.2*  --  23.9*  --  25.1*  INR  --  2.2*  --  2.2*  --  2.3*  CREATININE 0.92  --   --  0.74  --   --    < > = values in this interval not displayed.    Estimated Creatinine Clearance: 79.2 mL/min (by C-G formula based on SCr of 0.74 mg/dL).   Medical History: Past Medical History:  Diagnosis Date   AORTIC STENOSIS    a. Echo (05/26/13):  Mod LVH, vigorous LVF, EF 65-70%, no RWMA, Gr 1 DD, severe AS (mean 40 mmHg) => s/p bioprosthetic AVR 07/2013   Arthritis    BACK   CAD (coronary artery disease)    a. Lexiscan myoview (5/13) with EF 70%, no ischemia or infarction;   b. LHC (06/2013):  Dist LM 30%, ostial LAD 40-50%, prox LAD 30%, ostial D1 80-90%, prox CFX 40%, RCA occluded with L-R collats => CABG (S-PDA at time of AVR)   CVA (cerebral infarction)    a. 3/14 right parietal infarct. Carotid US 3/14 showed no significant disease. He has an implanted loop recorder to look for atrial fibrillation.    DYSPEPSIA    ED (erectile dysfunction)    Former smoker    GERD (gastroesophageal reflux disease)    History of kidney stones    HYPERLIPIDEMIA    HYPERTENSION    Palpitations    a. Event monitor (5/13) with  occasional PVCs, PACs but no significant arrhythmia.    S/P aortic valve replacement with bioprosthetic valve + CABG x1 07/11/2013   23 mm [facility] Ease bovine pericardial tissue valve;  Echo (08/2013):  EF 55-60%, no RWMA, AVR ok, Asc Ao 40 mm (mildly dilated), mild LAE, mild RVE, lipomatous hypertrophy, mod TR, PASP 36 mmHg   S/P CABG x 1 07/11/2013   SVG to PDA with [facility] via right thigh   Sinus bradycardia    a. 2013 - HR 40's in the office. BB stopped.   Trigeminal neuropathy    Vertigo     Assessment: Pharmacy consulted to dose warfarin for Afib PTA on warfarin 2.5 mg daily x 5 mg on Mondays Last dose 1/26 at 2000 Admitted 1/27 with symptomatic anemia. FOBT negative, no signs of GI bleeding.  No transfusions given.  Pharmacy also consulted to dose IV iron x 1 today prior to dc  04/03/2022  INR 2.3 - therapeutic after warfarin held x 2 days Hg 9.2 -up from admit w/ no blood transfused No bleeding reported   Goal of Therapy:  INR 2-3   Plan:  Warfarin  2.5 mg po qday Daily INR Ferrlecit 250 mg IV x 1 dose today  Eudelia Bunch, Pharm.D Use secure chat for questions 04/03/2022 10:37 AM

## 2022-04-03 NOTE — Progress Notes (Signed)
Physical Therapy Treatment Patient Details Name: Sean Brock MRN: [id number] DOB: 1934/08/26 Today's Date: 04/03/2022   History of Present Illness Pt is 88 yo gentleman presented on 04/01/22 with complaint of dizziness with standing, light activity and whenever he moves. He has a history of vertigo, without associated hypotension, but his dizziness is increased significantly in the past 1 to 2 weeks. Pt found to have hgb of 8 and admitted with symptomatic anemia.  Pt received 1 unit PRBC. Pt with history of aortic stenosis s/p AVR 2015, CVA, hyperlipidemia, hypertension, atrial fibrillation maintained on Coumadin, CAD, trigeminal neuralgia, peripheral arterial disease, vertigo    PT Comments    Pt making good progress with goals met or adequate for d/c .  Pt is transferring independently and demonstrated safe ambulation with use of his hurry-cane.  He denied any lightheadedness or vertigo.  Pt educated on safe position changes at home.  No further acute PT needs.    Recommendations for follow up therapy are one component of a multi-disciplinary discharge planning process, led by the attending physician.  Recommendations may be updated based on patient status, additional functional criteria and insurance authorization.  Follow Up Recommendations  No PT follow up     Assistance Recommended at Discharge None  Patient can return home with the following     Equipment Recommendations  None recommended by PT    Recommendations for Other Services       Precautions / Restrictions Precautions Precautions: None     Mobility  Bed Mobility Overal bed mobility: Independent                  Transfers Overall transfer level: Independent                 General transfer comment: in chair at arrival    Ambulation/Gait Ambulation/Gait assistance: Modified independent (Device/Increase time) Gait Distance (Feet): 500 Feet Assistive device: Straight cane Gait  Pattern/deviations: WFL(Within Functional Limits) Gait velocity: normal     General Gait Details: Had supervision for session but demonstrated safe and independent gait   Stairs             Wheelchair Mobility    Modified Rankin (Stroke Patients Only)       Balance Overall balance assessment: Independent                                          Cognition Arousal/Alertness: Awake/alert Behavior During Therapy: WFL for tasks assessed/performed Overall Cognitive Status: Within Functional Limits for tasks assessed                                          Exercises      General Comments General comments (skin integrity, edema, etc.): Pt reports hx of vertigo but not dizzy now.  States this admission was more weakness/lightheadeness and not room spinning vertigo like he has had in past.  Educated on safe and gradual transitions due to anemia/lightheadedness.  Pt verbalized understanding      Pertinent Vitals/Pain Pain Assessment Pain Assessment: No/denies pain    Home Living                          Prior Function  PT Goals (current goals can now be found in the care plan section) Acute Rehab PT Goals PT Goal Formulation: All assessment and education complete, DC therapy Progress towards PT goals: Goals met/education completed, patient discharged from PT (Met or adequately met goals)    Frequency           PT Plan      Co-evaluation              AM-PAC PT "6 Clicks" Mobility   Outcome Measure  Help needed turning from your back to your side while in a flat bed without using bedrails?: None Help needed moving from lying on your back to sitting on the side of a flat bed without using bedrails?: None Help needed moving to and from a bed to a chair (including a wheelchair)?: None Help needed standing up from a chair using your arms (e.g., wheelchair or bedside chair)?: None Help needed to  walk in hospital room?: None Help needed climbing 3-5 steps with a railing? : A Little 6 Click Score: 23    End of Session Equipment Utilized During Treatment: Gait belt Activity Tolerance: Patient tolerated treatment well Patient left: in chair;with family/visitor present;with call bell/phone within reach Nurse Communication: Mobility status PT Visit Diagnosis: Difficulty in walking, not elsewhere classified (R26.2)     Time: 1610-9604 PT Time Calculation (min) (ACUTE ONLY): 14 min  Charges:  $Gait Training: 8-22 mins                     Abran Richard, PT Acute Rehab [facility] [phone]    Karlton Lemon 04/03/2022, 12:35 PM

## 2022-04-05 ENCOUNTER — Encounter: Payer: Self-pay | Admitting: Physician Assistant

## 2022-04-05 ENCOUNTER — Ambulatory Visit: Payer: Medicare Other | Attending: Physician Assistant | Admitting: Physician Assistant

## 2022-04-05 VITALS — BP 122/68 | HR 60 | Ht 75.0 in | Wt 198.0 lb

## 2022-04-05 DIAGNOSIS — I10 Essential (primary) hypertension: Secondary | ICD-10-CM | POA: Diagnosis not present

## 2022-04-05 DIAGNOSIS — I2510 Atherosclerotic heart disease of native coronary artery without angina pectoris: Secondary | ICD-10-CM | POA: Diagnosis not present

## 2022-04-05 DIAGNOSIS — I951 Orthostatic hypotension: Secondary | ICD-10-CM | POA: Diagnosis not present

## 2022-04-05 DIAGNOSIS — Z952 Presence of prosthetic heart valve: Secondary | ICD-10-CM

## 2022-04-05 DIAGNOSIS — I4892 Unspecified atrial flutter: Secondary | ICD-10-CM | POA: Diagnosis not present

## 2022-04-05 DIAGNOSIS — Z950 Presence of cardiac pacemaker: Secondary | ICD-10-CM

## 2022-04-05 LAB — CUP PACEART INCLINIC DEVICE CHECK
Date Time Interrogation Session: 20240131131134
Implantable Lead Connection Status: 753985
Implantable Lead Connection Status: 753985
Implantable Lead Implant Date: 20201005
Implantable Lead Implant Date: 20201005
Implantable Lead Location: 753859
Implantable Lead Location: 753860
Implantable Lead Model: 7742
Implantable Lead Model: 7841
Implantable Lead Serial Number: 1017357
Implantable Lead Serial Number: 1068132
Implantable Pulse Generator Implant Date: 20201005
Lead Channel Impedance Value: 536 Ohm
Lead Channel Impedance Value: 675 Ohm
Lead Channel Pacing Threshold Amplitude: 1.1 V
Lead Channel Pacing Threshold Amplitude: 1.1 V
Lead Channel Pacing Threshold Pulse Width: 0.4 ms
Lead Channel Pacing Threshold Pulse Width: 0.8 ms
Lead Channel Sensing Intrinsic Amplitude: 11.7 mV
Lead Channel Sensing Intrinsic Amplitude: 5.1 mV
Lead Channel Setting Pacing Amplitude: 2.3 V
Lead Channel Setting Pacing Amplitude: 2.6 V
Lead Channel Setting Pacing Pulse Width: 0.8 ms
Lead Channel Setting Sensing Sensitivity: 1.5 mV
Pulse Gen Serial Number: 890709
Zone Setting Status: 755011

## 2022-04-05 NOTE — Patient Instructions (Addendum)
Medication Instructions:   Your physician recommends that you continue on your current medications as directed. Please refer to the Current Medication list given to you today.  *If you need a refill on your cardiac medications before your next appointment, please call your pharmacy*   Lab Work: [place]    If you have labs (blood work) drawn today and your tests are completely normal, you will receive your results only by: [place] (if you have MyChart) OR A paper copy in the mail If you have any lab test that is abnormal or we need to change your treatment, we will call you to review the results.   Testing/Procedures: Your physician has requested that you have an echocardiogram. Echocardiography is a painless test that uses sound waves to create images of your heart. It provides your doctor with information about the size and shape of your heart and how well your heart's chambers and valves are working. This procedure takes approximately one hour. There are no restrictions for this procedure. Please do NOT wear cologne, perfume, aftershave, or lotions (deodorant is allowed). Please arrive 15 minutes prior to your appointment time.    Follow-Up: At [facility], you and your health needs are our priority.  As part of our continuing mission to provide you with exceptional heart care, we have created designated Provider Care Teams.  These Care Teams include your primary Cardiologist (physician) and Advanced Practice Providers (APPs -  Physician Assistants and Nurse Practitioners) who all work together to provide you with the care you need, when you need it.  We recommend signing up for the patient portal called "MyChart".  Sign up information is provided on this After Visit Summary.  MyChart is used to connect with patients for Virtual Visits (Telemedicine).  Patients are able to view lab/test results, encounter notes, upcoming appointments, etc.  Non-urgent  messages can be sent to your provider as well.   To learn more about what you can do with MyChart, go to [email or web address].    Your next appointment:    2  month(s)  Provider:   Cristopher Peru, MD    Other Instructions  HOLD COREG FOR BP LESS 130 [place]  IS BETTER

## 2022-04-11 DIAGNOSIS — I4891 Unspecified atrial fibrillation: Secondary | ICD-10-CM | POA: Diagnosis not present

## 2022-04-11 DIAGNOSIS — D509 Iron deficiency anemia, unspecified: Secondary | ICD-10-CM | POA: Diagnosis not present

## 2022-04-11 DIAGNOSIS — I951 Orthostatic hypotension: Secondary | ICD-10-CM | POA: Diagnosis not present

## 2022-04-12 DIAGNOSIS — Z713 Dietary counseling and surveillance: Secondary | ICD-10-CM | POA: Diagnosis not present

## 2022-04-12 DIAGNOSIS — D509 Iron deficiency anemia, unspecified: Secondary | ICD-10-CM | POA: Diagnosis not present

## 2022-04-19 ENCOUNTER — Ambulatory Visit: Payer: Medicare Other | Attending: Internal Medicine | Admitting: Pharmacist

## 2022-04-19 DIAGNOSIS — Z953 Presence of xenogenic heart valve: Secondary | ICD-10-CM

## 2022-04-19 DIAGNOSIS — D6859 Other primary thrombophilia: Secondary | ICD-10-CM

## 2022-04-19 DIAGNOSIS — Z5181 Encounter for therapeutic drug level monitoring: Secondary | ICD-10-CM | POA: Diagnosis not present

## 2022-04-19 DIAGNOSIS — I4891 Unspecified atrial fibrillation: Secondary | ICD-10-CM

## 2022-04-19 DIAGNOSIS — I824Y9 Acute embolism and thrombosis of unspecified deep veins of unspecified proximal lower extremity: Secondary | ICD-10-CM

## 2022-04-19 LAB — POCT INR: INR: 3.2 — AB (ref 2.0–3.0)

## 2022-04-19 NOTE — Patient Instructions (Signed)
Description   Hold warfarin today and then continue taking warfarin 1/2 tablet daily except for 1 tablet on Mondays. Recheck INR in 2 weeks. Eat greens tonight. Coumadin Clinic [phone].

## 2022-04-25 NOTE — Progress Notes (Signed)
Remote pacemaker transmission.   

## 2022-04-28 ENCOUNTER — Ambulatory Visit (HOSPITAL_COMMUNITY): Payer: Medicare Other | Attending: Internal Medicine

## 2022-04-28 DIAGNOSIS — Z952 Presence of prosthetic heart valve: Secondary | ICD-10-CM | POA: Diagnosis not present

## 2022-04-28 LAB — ECHOCARDIOGRAM COMPLETE
AR max vel: 1.09 cm2
AV Area VTI: 1.31 cm2
AV Area mean vel: 1.17 cm2
AV Mean grad: 12 mmHg
AV Peak grad: 24.4 mmHg
Ao pk vel: 2.47 m/s
Area-P 1/2: 3.81 cm2
S' Lateral: 3.5 cm

## 2022-05-03 ENCOUNTER — Ambulatory Visit: Payer: Medicare Other | Attending: Internal Medicine

## 2022-05-03 DIAGNOSIS — Z953 Presence of xenogenic heart valve: Secondary | ICD-10-CM | POA: Diagnosis not present

## 2022-05-03 DIAGNOSIS — D6859 Other primary thrombophilia: Secondary | ICD-10-CM

## 2022-05-03 DIAGNOSIS — Z5181 Encounter for therapeutic drug level monitoring: Secondary | ICD-10-CM

## 2022-05-03 DIAGNOSIS — I824Y9 Acute embolism and thrombosis of unspecified deep veins of unspecified proximal lower extremity: Secondary | ICD-10-CM

## 2022-05-03 DIAGNOSIS — I4891 Unspecified atrial fibrillation: Secondary | ICD-10-CM | POA: Diagnosis not present

## 2022-05-03 LAB — POCT INR: INR: 2.3 (ref 2.0–3.0)

## 2022-05-03 NOTE — Patient Instructions (Signed)
Description   Continue taking warfarin 1/2 tablet daily except for 1 tablet on Mondays. Recheck INR in 4 weeks. Eat greens tonight. Coumadin Clinic [phone].

## 2022-05-08 DIAGNOSIS — D509 Iron deficiency anemia, unspecified: Secondary | ICD-10-CM | POA: Diagnosis not present

## 2022-05-12 DIAGNOSIS — H6123 Impacted cerumen, bilateral: Secondary | ICD-10-CM | POA: Diagnosis not present

## 2022-05-12 DIAGNOSIS — R2689 Other abnormalities of gait and mobility: Secondary | ICD-10-CM | POA: Diagnosis not present

## 2022-05-31 ENCOUNTER — Ambulatory Visit
Payer: Medicare Other | Attending: Internal Medicine | Admitting: Pharmacist Clinician (PhC)/ Clinical Pharmacy Specialist

## 2022-05-31 DIAGNOSIS — D6859 Other primary thrombophilia: Secondary | ICD-10-CM | POA: Diagnosis not present

## 2022-05-31 DIAGNOSIS — I824Y9 Acute embolism and thrombosis of unspecified deep veins of unspecified proximal lower extremity: Secondary | ICD-10-CM | POA: Diagnosis not present

## 2022-05-31 DIAGNOSIS — Z5181 Encounter for therapeutic drug level monitoring: Secondary | ICD-10-CM

## 2022-05-31 DIAGNOSIS — I4891 Unspecified atrial fibrillation: Secondary | ICD-10-CM | POA: Diagnosis not present

## 2022-05-31 DIAGNOSIS — Z953 Presence of xenogenic heart valve: Secondary | ICD-10-CM | POA: Diagnosis not present

## 2022-05-31 LAB — POCT INR: INR: 2.5 (ref 2.0–3.0)

## 2022-05-31 NOTE — Patient Instructions (Signed)
Continue taking warfarin 1/2 tablet daily except for 1 tablet on Mondays. Recheck INR in 4 weeks. Coumadin Clinic [phone].

## 2022-06-14 ENCOUNTER — Ambulatory Visit: Payer: Medicare Other | Attending: Internal Medicine | Admitting: Internal Medicine

## 2022-06-14 ENCOUNTER — Encounter: Payer: Self-pay | Admitting: Internal Medicine

## 2022-06-14 VITALS — BP 144/70 | HR 61 | Ht 75.0 in | Wt 199.2 lb

## 2022-06-14 DIAGNOSIS — I495 Sick sinus syndrome: Secondary | ICD-10-CM | POA: Diagnosis not present

## 2022-06-14 DIAGNOSIS — I10 Essential (primary) hypertension: Secondary | ICD-10-CM

## 2022-06-14 DIAGNOSIS — Z950 Presence of cardiac pacemaker: Secondary | ICD-10-CM

## 2022-06-14 DIAGNOSIS — I4892 Unspecified atrial flutter: Secondary | ICD-10-CM

## 2022-06-14 NOTE — Patient Instructions (Signed)
Medication Instructions:  Your physician recommends that you continue on your current medications as directed. Please refer to the Current Medication list given to you today.  *If you need a refill on your cardiac medications before your next appointment, please call your pharmacy*  Lab Work: None ordered.  If you have labs (blood work) drawn today and your tests are completely normal, you will receive your results only by: MyChart Message (if you have MyChart) OR A paper copy in the mail If you have any lab test that is abnormal or we need to change your treatment, we will call you to review the results.  Testing/Procedures: None ordered.  Follow-Up: At [facility], you and your health needs are our priority.  As part of our continuing mission to provide you with exceptional heart care, we have created designated Provider Care Teams.  These Care Teams include your primary Cardiologist (physician) and Advanced Practice Providers (APPs -  Physician Assistants and Nurse Practitioners) who all work together to provide you with the care you need, when you need it.   Your next appointment:   1 year(s)  The format for your next appointment:   In Person  Provider:   Lewayne Bunting, MD{or one of the following Advanced Practice Providers on your designated Care Team:   Francis Dowse, [place] Casimiro Needle "Mardelle Matte" Lanna Poche, [place]  Remote monitoring is used to monitor your Pacemaker from home. This monitoring reduces the number of office visits required to check your device to one time per year. It allows [place] to keep an eye on the functioning of your device to ensure it is working properly. You are scheduled for a device check from home on 06/27/2022. You may send your transmission at any time that day. If you have a wireless device, the transmission will be sent automatically. After your physician reviews your transmission, you will receive a postcard with your next transmission date.

## 2022-06-14 NOTE — Progress Notes (Signed)
HPI [name] returns after an over 3 year absence from our EP clinic today for followup of sinus node dysfunction, s/p PPM, Atrial flutter s/p ablation, and remote aortic valve replacement. He still has some occaisional dizzy spells. He has minimal palpitations. He was found to be iron deficit and anemic and has had a transfusion and iron infusion. He feels better.  Allergies  Allergen Reactions   Sudafed [Pseudoephedrine Hcl] Palpitations   Tizanidine     Other Reaction(s): drowsy   Cardizem [Diltiazem Hcl] Rash   Sudafed [Pseudoephedrine] Palpitations     Current Outpatient Medications  Medication Sig Dispense Refill   acetaminophen (TYLENOL) 500 MG tablet Take 500 mg by mouth every 6 (six) hours as needed for mild pain.      aspirin EC 81 MG EC tablet Take 1 tablet (81 mg total) by mouth daily.     B Complex Vitamins (VITAMIN B COMPLEX) TABS daily at 6 (six) AM.     Carboxymethylcellul-Glycerin (CLEAR EYES FOR DRY EYES OP) Place 1 drop into both eyes daily.     carvedilol (COREG) 3.125 MG tablet Take 1 tablet (3.125 mg total) by mouth 2 (two) times daily. 180 tablet 3   Emollient (MEDERMA AG FACE EX) Apply 1 application topically daily as needed (on face for skin cancer).     famotidine (PEPCID) 20 MG tablet Take 20 mg by mouth 2 (two) times daily.     ferrous sulfate 325 (65 FE) MG tablet Take 325 mg by mouth daily with breakfast.     polyethylene glycol (MIRALAX / GLYCOLAX) 17 g packet Take 17 g by mouth daily as needed (constipation.).     rosuvastatin (CRESTOR) 10 MG tablet Take 10 mg by mouth every evening.      tamsulosin (FLOMAX) 0.4 MG CAPS capsule Take 0.4 mg by mouth every evening.      warfarin (COUMADIN) 5 MG tablet TAKE 1/2 TO 1 (ONE-HALF TO ONE) TABLET BY MOUTH ONCE DAILY AS  DIRECTED  BY  COUMADIN  CLINIC (Patient taking differently: Take 2.5-5 mg by mouth as directed. Take 1 tablet (5 mg) Every Monday Then Take 1/2 tablet (2.5 mg) ALL OTHER DAYS OF WEEK AS  DIRECTED BY COUMADIN CLINIC) 60 tablet 1   No current facility-administered medications for this visit.     Past Medical History:  Diagnosis Date   AORTIC STENOSIS    a. Echo (05/26/13):  Mod LVH, vigorous LVF, EF 65-70%, no RWMA, Gr 1 DD, severe AS (mean 40 mmHg) => s/p bioprosthetic AVR 07/2013   Arthritis    BACK   CAD (coronary artery disease)    a. Lexiscan myoview (5/13) with EF 70%, no ischemia or infarction;   b. LHC (06/2013):  Dist LM 30%, ostial LAD 40-50%, prox LAD 30%, ostial D1 80-90%, prox CFX 40%, RCA occluded with L-R collats => CABG (S-PDA at time of AVR)   CVA (cerebral infarction)    a. 3/14 right parietal infarct. Carotid US 3/14 showed no significant disease. He has an implanted loop recorder to look for atrial fibrillation.    DYSPEPSIA    ED (erectile dysfunction)    Former smoker    GERD (gastroesophageal reflux disease)    History of kidney stones    HYPERLIPIDEMIA    HYPERTENSION    Palpitations    a. Event monitor (5/13) with occasional PVCs, PACs but no significant arrhythmia.    S/P aortic valve replacement with bioprosthetic valve + CABG x1  07/11/2013   23 mm [facility] Ease bovine pericardial tissue valve;  Echo (08/2013):  EF 55-60%, no RWMA, AVR ok, Asc Ao 40 mm (mildly dilated), mild LAE, mild RVE, lipomatous hypertrophy, mod TR, PASP 36 mmHg   S/P CABG x 1 07/11/2013   SVG to PDA with [facility] via right thigh   Sinus bradycardia    a. 2013 - HR 40's in the office. BB stopped.   Trigeminal neuropathy    Vertigo     ROS:   All systems reviewed and negative except as noted in the HPI.   Past Surgical History:  Procedure Laterality Date   A-FLUTTER ABLATION N/A 11/13/2018   Procedure: A-FLUTTER ABLATION;  Surgeon: Marinus Maw, MD;  Location: [facility] INVASIVE CV LAB;  Service: Cardiovascular;  Laterality: N/A;   ABDOMINAL AORTOGRAM W/LOWER EXTREMITY N/A 02/03/2021   Procedure: ABDOMINAL AORTOGRAM W/LOWER EXTREMITY;  Surgeon: Cephus Shelling, MD;   Location: [facility] INVASIVE CV LAB;  Service: Cardiovascular;  Laterality: N/A;   AORTIC VALVE REPLACEMENT N/A 07/11/2013   Procedure: AORTIC VALVE REPLACEMENT (AVR);  Surgeon: Purcell Nails, MD;  Location: [facility] OR;  Service: Open Heart Surgery;  Laterality: N/A;   CATARACT EXTRACTION     CORONARY ARTERY BYPASS GRAFT N/A 07/11/2013   Procedure: CORONARY ARTERY BYPASS GRAFTING (CABG) x1;  Surgeon: Purcell Nails, MD;  Location: [facility] OR;  Service: Open Heart Surgery;  Laterality: N/A;   INGUINAL HERNIA REPAIR Right 01/15/2013   Procedure: HERNIA REPAIR INGUINAL ADULT;  Surgeon: Shelly Rubenstein, MD;  Location: [facility];  Service: General;  Laterality: Right;   INSERTION OF MESH Right 01/15/2013   Procedure: INSERTION OF MESH;  Surgeon: Shelly Rubenstein, MD;  Location: [facility];  Service: General;  Laterality: Right;   INTRAOPERATIVE TRANSESOPHAGEAL ECHOCARDIOGRAM N/A 07/11/2013   Procedure: INTRAOPERATIVE TRANSESOPHAGEAL ECHOCARDIOGRAM;  Surgeon: Purcell Nails, MD;  Location: [facility] OR;  Service: Open Heart Surgery;  Laterality: N/A;   LEFT AND RIGHT HEART CATHETERIZATION WITH CORONARY ANGIOGRAM N/A 06/06/2013   Procedure: LEFT AND RIGHT HEART CATHETERIZATION WITH CORONARY ANGIOGRAM;  Surgeon: Laurey Morale, MD;  Location: [facility] CATH LAB;  Service: Cardiovascular;  Laterality: N/A;   LITHOTRIPSY     LOOP RECORDER IMPLANT  04/15/2013   MDT LinQ implanred by Dr Ladona Ridgel for cryptogenic stroke   LOOP RECORDER IMPLANT N/A 04/14/2013   Procedure: LOOP RECORDER IMPLANT;  Surgeon: Marinus Maw, MD;  Location: [facility] CATH LAB;  Service: Cardiovascular;  Laterality: N/A;   LOOP RECORDER REMOVAL N/A 11/13/2018   Procedure: LOOP RECORDER REMOVAL;  Surgeon: Marinus Maw, MD;  Location: [facility] INVASIVE CV LAB;  Service: Cardiovascular;  Laterality: N/A;   MOUTH SURGERY     PACEMAKER IMPLANT N/A 12/09/2018   Procedure: PACEMAKER IMPLANT;  Surgeon: Marinus Maw, MD;  Location: [facility] INVASIVE CV LAB;   Service: Cardiovascular;  Laterality: N/A;   PERIPHERAL VASCULAR INTERVENTION Left 02/03/2021   Procedure: PERIPHERAL VASCULAR INTERVENTION;  Surgeon: Cephus Shelling, MD;  Location: [facility] INVASIVE CV LAB;  Service: Cardiovascular;  Laterality: Left;  sfa     Family History  Problem Relation Age of Onset   Diabetes Mother    Heart attack Mother    Heart attack Father    Hypertension Father    Diabetes Unknown    Hypertension Unknown    Lung cancer Unknown    Cancer Sister        breast   Stroke Neg Hx  Social History   Socioeconomic History   Marital status: Married    Spouse name: Not on file   Number of children: Not on file   Years of education: Not on file   Highest education level: Not on file  Occupational History   Not on file  Tobacco Use   Smoking status: Former    Packs/day: 1.00    Years: 30.00    Additional pack years: 0.00    Total pack years: 30.00    Types: Cigarettes    Quit date: 08/29/2009    Years since quitting: 12.8   Smokeless tobacco: Never  Vaping Use   Vaping Use: Every day  Substance and Sexual Activity   Alcohol use: No   Drug use: No   Sexual activity: Not Currently  Other Topics Concern   Not on file  Social History Narrative   He resides in [place] with his wife and son. 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  No    grandchildren. He smokes half a pack per day intermittently for the last 40    years. He drinks a glass of homemade wine mixed with diet Coke one time per    week. He denies any drugs, herbal medication, diet, or exercise program.         Social Determinants of Health   Financial Resource Strain: Not on file  Food Insecurity: Patient Declined (04/02/2022)   Hunger Vital Sign    Worried About Running Out of Food in the Last Year: Patient declined    Ran Out of Food in the Last Year: Patient declined  Transportation Needs: No Transportation Needs (04/02/2022)   PRAPARE -  Administrator, Civil Service (Medical): No    Lack of Transportation (Non-Medical): No  Physical Activity: Not on file  Stress: Not on file  Social Connections: Not on file  Intimate Partner Violence: Patient Declined (04/02/2022)   Humiliation, Afraid, Rape, and Kick questionnaire    Fear of Current or Ex-Partner: Patient declined    Emotionally Abused: Patient declined    Physically Abused: Patient declined    Sexually Abused: Patient declined     BP (!) 144/70   Pulse 61   Ht 6\' 3"  (1.905 m)   Wt 199 lb 3.2 oz (90.4 kg)   SpO2 99%   BMI 24.90 kg/m   Physical Exam:  Well appearing 88 yo man, NAD HEENT: Unremarkable Neck:  No JVD, no thyromegally Lymphatics:  No adenopathy Back:  No CVA tenderness Lungs:  Clear with no wheezes HEART:  Regular rate rhythm, no murmurs, no rubs, no clicks Abd:  soft, positive bowel sounds, no organomegally, no rebound, no guarding Ext:  2 plus pulses, no edema, no cyanosis, no clubbing Skin:  No rashes no nodules Neuro:  CN II through XII intact, motor grossly intact  EKG - NSR with intermittent AV pacing  DEVICE  Normal device function.  See PaceArt for details.   Assess/Plan: 1. Sinus node dysfunction/CHB - he is asymptomatic, s/p PPM insertion. We adjusted his AV delays today. 2. PPM - his [place] sci DDD PM is working normally and he is pacing in the atrium and the ventricle. 3. Atrial flutter - he has maintained NSR. He will continue his current meds.  4. HTN - his bp is well controlled. No change in his meds.   Leonia Reeves.D.

## 2022-06-20 DIAGNOSIS — D509 Iron deficiency anemia, unspecified: Secondary | ICD-10-CM | POA: Diagnosis not present

## 2022-06-27 ENCOUNTER — Ambulatory Visit (INDEPENDENT_AMBULATORY_CARE_PROVIDER_SITE_OTHER): Payer: Medicare Other

## 2022-06-27 DIAGNOSIS — I495 Sick sinus syndrome: Secondary | ICD-10-CM | POA: Diagnosis not present

## 2022-06-28 ENCOUNTER — Ambulatory Visit: Payer: Medicare Other

## 2022-06-28 DIAGNOSIS — Z5181 Encounter for therapeutic drug level monitoring: Secondary | ICD-10-CM

## 2022-06-28 DIAGNOSIS — D6859 Other primary thrombophilia: Secondary | ICD-10-CM | POA: Diagnosis not present

## 2022-06-28 DIAGNOSIS — I4891 Unspecified atrial fibrillation: Secondary | ICD-10-CM

## 2022-06-28 DIAGNOSIS — I824Y9 Acute embolism and thrombosis of unspecified deep veins of unspecified proximal lower extremity: Secondary | ICD-10-CM

## 2022-06-28 DIAGNOSIS — Z953 Presence of xenogenic heart valve: Secondary | ICD-10-CM

## 2022-06-28 LAB — CUP PACEART REMOTE DEVICE CHECK
Battery Remaining Longevity: 102 mo
Battery Remaining Percentage: 98 %
Brady Statistic RA Percent Paced: 73 %
Brady Statistic RV Percent Paced: 99 %
Date Time Interrogation Session: 20240423034100
Implantable Lead Connection Status: 753985
Implantable Lead Connection Status: 753985
Implantable Lead Implant Date: 20201005
Implantable Lead Implant Date: 20201005
Implantable Lead Location: 753859
Implantable Lead Location: 753860
Implantable Lead Model: 7742
Implantable Lead Model: 7841
Implantable Lead Serial Number: 1017357
Implantable Lead Serial Number: 1068132
Implantable Pulse Generator Implant Date: 20201005
Lead Channel Impedance Value: 559 Ohm
Lead Channel Impedance Value: 686 Ohm
Lead Channel Setting Pacing Amplitude: 2.3 V
Lead Channel Setting Pacing Amplitude: 2.6 V
Lead Channel Setting Pacing Pulse Width: 0.8 ms
Lead Channel Setting Sensing Sensitivity: 1.5 mV
Pulse Gen Serial Number: 890709
Zone Setting Status: 755011

## 2022-06-28 LAB — POCT INR: INR: 3.4 — AB (ref 2.0–3.0)

## 2022-06-28 NOTE — Patient Instructions (Signed)
Description   Skip today's dosage of Warfarin, then resume same dosage of Warfarin 1/2 tablet daily except for 1 tablet on Mondays. Recheck INR in 3 weeks. Coumadin Clinic [phone].

## 2022-06-29 ENCOUNTER — Ambulatory Visit (HOSPITAL_COMMUNITY)
Admission: RE | Admit: 2022-06-29 | Discharge: 2022-06-29 | Disposition: A | Discharge status: Home / Self Care | Payer: Medicare Other | Source: Ambulatory Visit | Attending: Vascular Surgery | Admitting: Vascular Surgery

## 2022-06-29 ENCOUNTER — Ambulatory Visit (INDEPENDENT_AMBULATORY_CARE_PROVIDER_SITE_OTHER)
Admission: RE | Admit: 2022-06-29 | Discharge: 2022-06-29 | Disposition: A | Discharge status: Home / Self Care | Payer: Medicare Other | Source: Ambulatory Visit | Attending: Vascular Surgery | Admitting: Vascular Surgery

## 2022-06-29 DIAGNOSIS — Z5181 Encounter for therapeutic drug level monitoring: Secondary | ICD-10-CM | POA: Insufficient documentation

## 2022-06-29 DIAGNOSIS — I70213 Atherosclerosis of native arteries of extremities with intermittent claudication, bilateral legs: Secondary | ICD-10-CM

## 2022-06-29 DIAGNOSIS — Z953 Presence of xenogenic heart valve: Secondary | ICD-10-CM | POA: Insufficient documentation

## 2022-06-29 DIAGNOSIS — I824Y9 Acute embolism and thrombosis of unspecified deep veins of unspecified proximal lower extremity: Secondary | ICD-10-CM | POA: Diagnosis not present

## 2022-06-29 DIAGNOSIS — I4891 Unspecified atrial fibrillation: Secondary | ICD-10-CM | POA: Insufficient documentation

## 2022-06-29 DIAGNOSIS — D6859 Other primary thrombophilia: Secondary | ICD-10-CM | POA: Insufficient documentation

## 2022-06-29 LAB — VAS US ABI WITH/WO TBI
Left ABI: 1.12
Right ABI: 0.76

## 2022-07-04 ENCOUNTER — Encounter: Payer: Self-pay | Admitting: Vascular Surgery

## 2022-07-04 ENCOUNTER — Ambulatory Visit: Payer: Medicare Other | Admitting: Vascular Surgery

## 2022-07-04 VITALS — BP 184/85 | HR 60 | Temp 97.8°F | Resp 16 | Ht 75.0 in | Wt 197.0 lb

## 2022-07-04 DIAGNOSIS — I70213 Atherosclerosis of native arteries of extremities with intermittent claudication, bilateral legs: Secondary | ICD-10-CM | POA: Diagnosis not present

## 2022-07-04 NOTE — Progress Notes (Signed)
Patient name: Sean Brock MRN: [id number] DOB: October 24, 1934 Sex: male  REASON FOR CONSULT: 6 month follow-up after previous left lower extremity intervention  HPI: Sean Brock is a 88 y.o. male, with history of coronary artery disease status post CABG with aortic valve replacement on coumadin, previous stroke, hypertension, hyperlipidemia, former smoker that presents for 6 month follow-up after previous left leg intervention.  He was initially seen with bilateral lower extremity short distance lifestyle limiting claudication.  On 02/03/2021 he underwent left SFA above-knee popliteal angioplasty with stenting for occlusion at [place] [facility].  Left leg is doing great.  Not having any contralateral right leg problems either.  Overall pleased today.  Was recently hospitalized with iron deficiency anemia.  Past Medical History:  Diagnosis Date   AORTIC STENOSIS    a. Echo (05/26/13):  Mod LVH, vigorous LVF, EF 65-70%, no RWMA, Gr 1 DD, severe AS (mean 40 mmHg) => s/p bioprosthetic AVR 07/2013   Arthritis    BACK   CAD (coronary artery disease)    a. Lexiscan myoview (5/13) with EF 70%, no ischemia or infarction;   b. LHC (06/2013):  Dist LM 30%, ostial LAD 40-50%, prox LAD 30%, ostial D1 80-90%, prox CFX 40%, RCA occluded with L-R collats => CABG (S-PDA at time of AVR)   CVA (cerebral infarction)    a. 3/14 right parietal infarct. Carotid US 3/14 showed no significant disease. He has an implanted loop recorder to look for atrial fibrillation.    DYSPEPSIA    ED (erectile dysfunction)    Former smoker    GERD (gastroesophageal reflux disease)    History of kidney stones    HYPERLIPIDEMIA    HYPERTENSION    Palpitations    a. Event monitor (5/13) with occasional PVCs, PACs but no significant arrhythmia.    S/P aortic valve replacement with bioprosthetic valve + CABG x1 07/11/2013   23 mm [facility] Ease bovine pericardial tissue valve;  Echo (08/2013):  EF 55-60%, no RWMA, AVR ok, Asc Ao 40  mm (mildly dilated), mild LAE, mild RVE, lipomatous hypertrophy, mod TR, PASP 36 mmHg   S/P CABG x 1 07/11/2013   SVG to PDA with [facility] via right thigh   Sinus bradycardia    a. 2013 - HR 40's in the office. BB stopped.   Trigeminal neuropathy    Vertigo     Past Surgical History:  Procedure Laterality Date   A-FLUTTER ABLATION N/A 11/13/2018   Procedure: A-FLUTTER ABLATION;  Surgeon: Marinus Maw, MD;  Location: [facility];  Service: Cardiovascular;  Laterality: N/A;   ABDOMINAL AORTOGRAM W/LOWER EXTREMITY N/A 02/03/2021   Procedure: ABDOMINAL AORTOGRAM W/LOWER EXTREMITY;  Surgeon: Cephus Shelling, MD;  Location: [facility];  Service: Cardiovascular;  Laterality: N/A;   AORTIC VALVE REPLACEMENT N/A 07/11/2013   Procedure: AORTIC VALVE REPLACEMENT (AVR);  Surgeon: Purcell Nails, MD;  [facility];  Service: Open Heart Surgery;  Laterality: N/A;   CATARACT EXTRACTION     CORONARY ARTERY BYPASS GRAFT N/A 07/11/2013   Procedure: CORONARY ARTERY BYPASS GRAFTING (CABG) x1;  Surgeon: Purcell Nails, MD;  Location: [facility] OR;  Service: Open Heart Surgery;  Laterality: N/A;   INGUINAL HERNIA REPAIR Right 01/15/2013   Procedure: HERNIA REPAIR INGUINAL ADULT;  Surgeon: Shelly Rubenstein, MD;  Location: [facility];  Service: General;  Laterality: Right;   INSERTION OF MESH Right 01/15/2013   Procedure: INSERTION OF MESH;  Surgeon:  Shelly Rubenstein, MD;  Location: [facility];  Service: General;  Laterality: Right;   INTRAOPERATIVE TRANSESOPHAGEAL ECHOCARDIOGRAM N/A 07/11/2013   Procedure: INTRAOPERATIVE TRANSESOPHAGEAL ECHOCARDIOGRAM;  Surgeon: Purcell Nails, MD;  Location: [facility] OR;  Service: Open Heart Surgery;  Laterality: N/A;   LEFT AND RIGHT HEART CATHETERIZATION WITH CORONARY ANGIOGRAM N/A 06/06/2013   Procedure: LEFT AND RIGHT HEART CATHETERIZATION WITH CORONARY ANGIOGRAM;  Surgeon: Laurey Morale, MD;  Location: [facility] CATH LAB;  Service:  Cardiovascular;  Laterality: N/A;   LITHOTRIPSY     LOOP RECORDER IMPLANT  04/15/2013   MDT LinQ implanred by Dr Ladona Ridgel for cryptogenic stroke   LOOP RECORDER IMPLANT N/A 04/14/2013   Procedure: LOOP RECORDER IMPLANT;  Surgeon: Marinus Maw, MD;  Location: [facility] CATH LAB;  Service: Cardiovascular;  Laterality: N/A;   LOOP RECORDER REMOVAL N/A 11/13/2018   Procedure: LOOP RECORDER REMOVAL;  Surgeon: Marinus Maw, MD;  [facility];  Service: Cardiovascular;  Laterality: N/A;   MOUTH SURGERY     PACEMAKER IMPLANT N/A 12/09/2018   Procedure: PACEMAKER IMPLANT;  Surgeon: Marinus Maw, MD;  [facility]: [facility];  Service: Cardiovascular;  Laterality: N/A;   PERIPHERAL VASCULAR INTERVENTION Left 02/03/2021   Procedure: PERIPHERAL VASCULAR INTERVENTION;  Surgeon: Cephus Shelling, MD;  Location: [facility];  Service: Cardiovascular;  Laterality: Left;  sfa    Family History  Problem Relation Age of Onset   Diabetes Mother    Heart attack Mother    Heart attack Father    Hypertension Father    Diabetes Unknown    Hypertension Unknown    Lung cancer Unknown    Cancer Sister        breast   Stroke Neg Hx     SOCIAL HISTORY: Social History   Socioeconomic History   Marital status: Married    Spouse name: Not on file   Number of children: Not on file   Years of education: Not on file   Highest education level: Not on file  Occupational History   Not on file  Tobacco Use   Smoking status: Former    Packs/day: 1.00    Years: 30.00    Additional pack years: 0.00    Total pack years: 30.00    Types: Cigarettes    Quit date: 08/29/2009    Years since quitting: 12.8   Smokeless tobacco: Never  Vaping Use   Vaping Use: Every day  Substance and Sexual Activity   Alcohol use: No   Drug use: No   Sexual activity: Not Currently  Other Topics Concern   Not on file  Social History Narrative   He resides in [place] with his wife and son. 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  No    grandchildren. He smokes half a pack per day intermittently for the last 40    years. He drinks a glass of homemade wine mixed with diet Coke one time per    week. He denies any drugs, herbal medication, diet, or exercise program.         Social Determinants of Health   Financial Resource Strain: Not on file  Food Insecurity: Patient Declined (04/02/2022)   Hunger Vital Sign    Worried About Running Out of Food in the Last Year: Patient declined    Ran Out of Food in the Last Year: Patient  declined  Transportation Needs: No Transportation Needs (04/02/2022)   PRAPARE - Administrator, Civil Service (Medical): No    Lack of Transportation (Non-Medical): No  Physical Activity: Not on file  Stress: Not on file  Social Connections: Not on file  Intimate Partner Violence: Patient Declined (04/02/2022)   Humiliation, Afraid, Rape, and Kick questionnaire    Fear of Current or Ex-Partner: Patient declined    Emotionally Abused: Patient declined    Physically Abused: Patient declined    Sexually Abused: Patient declined    Allergies  Allergen Reactions   Sudafed [Pseudoephedrine Hcl] Palpitations   Tizanidine     Other Reaction(s): drowsy   Cardizem [Diltiazem Hcl] Rash   Sudafed [Pseudoephedrine] Palpitations    Current Outpatient Medications  Medication Sig Dispense Refill   acetaminophen (TYLENOL) 500 MG tablet Take 500 mg by mouth every 6 (six) hours as needed for mild pain.      aspirin EC 81 MG EC tablet Take 1 tablet (81 mg total) by mouth daily.     B Complex Vitamins (VITAMIN B COMPLEX) TABS daily at 6 (six) AM.     Carboxymethylcellul-Glycerin (CLEAR EYES FOR DRY EYES OP) Place 1 drop into both eyes daily.     carvedilol (COREG) 3.125 MG tablet Take 1 tablet (3.125 mg total) by mouth 2 (two) times daily. 180 tablet 3   Emollient (MEDERMA AG FACE EX) Apply 1 application topically daily  as needed (on face for skin cancer).     famotidine (PEPCID) 20 MG tablet Take 20 mg by mouth 2 (two) times daily.     ferrous sulfate 325 (65 FE) MG tablet Take 325 mg by mouth daily with breakfast.     polyethylene glycol (MIRALAX / GLYCOLAX) 17 g packet Take 17 g by mouth daily as needed (constipation.).     rosuvastatin (CRESTOR) 10 MG tablet Take 10 mg by mouth every evening.      tamsulosin (FLOMAX) 0.4 MG CAPS capsule Take 0.4 mg by mouth every evening.      warfarin (COUMADIN) 5 MG tablet TAKE 1/2 TO 1 (ONE-HALF TO ONE) TABLET BY MOUTH ONCE DAILY AS  DIRECTED  BY  COUMADIN  CLINIC (Patient taking differently: Take 2.5-5 mg by mouth as directed. Take 1 tablet (5 mg) Every Monday Then Take 1/2 tablet (2.5 mg) ALL OTHER DAYS OF WEEK AS DIRECTED BY COUMADIN CLINIC) 60 tablet 1   No current facility-administered medications for this visit.    REVIEW OF SYSTEMS:  [X]  denotes positive finding, [ ]  denotes negative finding Cardiac  Comments:  Chest pain or chest pressure:    Shortness of breath upon exertion:    Short of breath when lying flat:    Irregular heart rhythm:        Vascular    Pain in calf, thigh, or hip brought on by ambulation:    Pain in feet at night that wakes you up from your sleep:     Blood clot in your veins:    Leg swelling:         Pulmonary    Oxygen at home:    Productive cough:     Wheezing:         Neurologic    Sudden weakness in arms or legs:     Sudden numbness in arms or legs:     Sudden onset of difficulty speaking or slurred speech:    Temporary loss of vision in one eye:  Problems with dizziness:         Gastrointestinal    Blood in stool:     Vomited blood:         Genitourinary    Burning when urinating:     Blood in urine:        Psychiatric    Major depression:         Hematologic    Bleeding problems:    Problems with blood clotting too easily:        Skin    Rashes or ulcers:        Constitutional    Fever or chills:       PHYSICAL EXAM: Vitals:   07/04/22 1309  BP: (!) 184/85  Pulse: 60  Resp: 16  Temp: 97.8 F (36.6 C)  TempSrc: Temporal  SpO2: 96%  Weight: 197 lb (89.4 kg)  Height: 6\' 3"  (1.905 m)    GENERAL: The patient is a well-nourished male, in no acute distress. The vital signs are documented above. CARDIAC: There is a regular rate and rhythm.  VASCULAR:  Palpable femoral pulses bilaterally No palpable right pedal pulses but no tissue loss and foot warm Left DP/PT palpable, no left lower extremity tissue loss  DATA:   ABIs 06/29/22 are 0.76 on the right monophasic and 1.12 on the left biphasic  Arterial duplex on 06/29/22 shows left SFA stents widely patent on duplex  Assessment/Plan:  88 year old male with multiple medical comorbidities that presents for 6 month follow-up after previous left leg intervention.  This included left SFA and above-knee popliteal angioplasty with stenting on 02/03/2021 for occlusion at [facility] canal.  Continues to have a palpable pulse in the left foot.  The left leg stent is patent by duplex today.  No recurrent claudication symptoms in the left leg.  In addition he has no significant symptoms in the right leg.  I am comfortable with continued observation.  Discussed we can extend his surveillance to 1 year.  I will see him in 1 year with left leg arterial duplex and ABIs.  Cephus Shelling, MD [facility] Office: [phone]

## 2022-07-10 ENCOUNTER — Other Ambulatory Visit: Payer: Self-pay

## 2022-07-10 DIAGNOSIS — I70213 Atherosclerosis of native arteries of extremities with intermittent claudication, bilateral legs: Secondary | ICD-10-CM

## 2022-07-13 DIAGNOSIS — Z8673 Personal history of transient ischemic attack (TIA), and cerebral infarction without residual deficits: Secondary | ICD-10-CM | POA: Diagnosis not present

## 2022-07-13 DIAGNOSIS — D509 Iron deficiency anemia, unspecified: Secondary | ICD-10-CM | POA: Diagnosis not present

## 2022-07-13 DIAGNOSIS — R7309 Other abnormal glucose: Secondary | ICD-10-CM | POA: Diagnosis not present

## 2022-07-13 DIAGNOSIS — I700 Atherosclerosis of aorta: Secondary | ICD-10-CM | POA: Diagnosis not present

## 2022-07-13 DIAGNOSIS — I119 Hypertensive heart disease without heart failure: Secondary | ICD-10-CM | POA: Diagnosis not present

## 2022-07-13 DIAGNOSIS — I4891 Unspecified atrial fibrillation: Secondary | ICD-10-CM | POA: Diagnosis not present

## 2022-07-13 DIAGNOSIS — D6869 Other thrombophilia: Secondary | ICD-10-CM | POA: Diagnosis not present

## 2022-07-13 DIAGNOSIS — Z0000 Encounter for general adult medical examination without abnormal findings: Secondary | ICD-10-CM | POA: Diagnosis not present

## 2022-07-13 DIAGNOSIS — E7800 Pure hypercholesterolemia, unspecified: Secondary | ICD-10-CM | POA: Diagnosis not present

## 2022-07-14 DIAGNOSIS — L570 Actinic keratosis: Secondary | ICD-10-CM | POA: Diagnosis not present

## 2022-07-14 DIAGNOSIS — B353 Tinea pedis: Secondary | ICD-10-CM | POA: Diagnosis not present

## 2022-07-14 DIAGNOSIS — X32XXXA Exposure to sunlight, initial encounter: Secondary | ICD-10-CM | POA: Diagnosis not present

## 2022-07-14 DIAGNOSIS — B351 Tinea unguium: Secondary | ICD-10-CM | POA: Diagnosis not present

## 2022-07-19 ENCOUNTER — Ambulatory Visit: Payer: Medicare Other | Attending: Internal Medicine

## 2022-07-19 DIAGNOSIS — Z5181 Encounter for therapeutic drug level monitoring: Secondary | ICD-10-CM | POA: Diagnosis not present

## 2022-07-19 DIAGNOSIS — Z953 Presence of xenogenic heart valve: Secondary | ICD-10-CM | POA: Diagnosis not present

## 2022-07-19 DIAGNOSIS — D6859 Other primary thrombophilia: Secondary | ICD-10-CM | POA: Diagnosis not present

## 2022-07-19 DIAGNOSIS — I4891 Unspecified atrial fibrillation: Secondary | ICD-10-CM | POA: Diagnosis not present

## 2022-07-19 DIAGNOSIS — I824Y9 Acute embolism and thrombosis of unspecified deep veins of unspecified proximal lower extremity: Secondary | ICD-10-CM

## 2022-07-19 LAB — POCT INR: INR: 3.2 — AB (ref 2.0–3.0)

## 2022-07-19 NOTE — Patient Instructions (Signed)
Skip today's dosage of Warfarin, then Decrease to 1/2 tablet daily.  Recheck INR in 3 weeks. Coumadin Clinic [phone].

## 2022-07-25 DIAGNOSIS — R6889 Other general symptoms and signs: Secondary | ICD-10-CM | POA: Diagnosis not present

## 2022-07-25 NOTE — Progress Notes (Signed)
Remote pacemaker transmission.   

## 2022-08-03 ENCOUNTER — Other Ambulatory Visit: Payer: Self-pay | Admitting: Internal Medicine

## 2022-08-03 DIAGNOSIS — I4891 Unspecified atrial fibrillation: Secondary | ICD-10-CM

## 2022-08-09 ENCOUNTER — Ambulatory Visit: Payer: Medicare Other | Attending: Internal Medicine

## 2022-08-09 DIAGNOSIS — Z5181 Encounter for therapeutic drug level monitoring: Secondary | ICD-10-CM | POA: Diagnosis not present

## 2022-08-09 DIAGNOSIS — Z953 Presence of xenogenic heart valve: Secondary | ICD-10-CM | POA: Diagnosis not present

## 2022-08-09 DIAGNOSIS — I824Y9 Acute embolism and thrombosis of unspecified deep veins of unspecified proximal lower extremity: Secondary | ICD-10-CM | POA: Diagnosis not present

## 2022-08-09 DIAGNOSIS — D6859 Other primary thrombophilia: Secondary | ICD-10-CM

## 2022-08-09 DIAGNOSIS — I4891 Unspecified atrial fibrillation: Secondary | ICD-10-CM | POA: Diagnosis not present

## 2022-08-09 LAB — POCT INR: INR: 1.7 — AB (ref 2.0–3.0)

## 2022-08-09 NOTE — Patient Instructions (Signed)
TAKE 1 TABLET TODAY ONLY THEN CONTINUE 1/2 tablet daily.  Recheck INR in 3 weeks.  EAT 2 HELPINGS OF GREENS PER WEEK. Coumadin Clinic [phone].

## 2022-08-14 ENCOUNTER — Telehealth: Payer: Self-pay | Admitting: Internal Medicine

## 2022-08-14 NOTE — Telephone Encounter (Signed)
Message received from [facility] Triage.    [facility] wants to change or add valsartan to Pt medication per note received.  Ms. Marylene Land just called back; Made aware Dr. Ladona Ridgel is not in the office today, but will answer the med change question Pt med Coreg to Valsartan after consulting Dr. Ladona Ridgel tomorrow, 6/11 in clinic.  F/u still required.

## 2022-08-14 NOTE — Telephone Encounter (Signed)
Pt c/o medication issue:  1. Name of Medication: carvedilol (COREG) 3.125 MG tablet   2. How are you currently taking this medication (dosage and times per day)?   Take 1 tablet (3.125 mg total) by mouth 2 (two) times daily.    3. Are you having a reaction (difficulty breathing--STAT)? no  4. What is your medication issue?  Office calling to see if they can change patient medication. Would like to change or add valsartan to patient medication. Please

## 2022-08-16 NOTE — Telephone Encounter (Signed)
Called Ms. Marylene Land RN of [facility] back.    Spoke to Dr. Lewayne Bunting yesterday after clinic; Orders received from Dr. Ladona Ridgel: [facility] [place] to add Valsartan to Pt medication list.  08/15/2022.    I shared this order with Ms. Marylene Land RN, who will forward it to her providers at [facility].   No follow up required at this time.

## 2022-08-16 NOTE — Telephone Encounter (Signed)
Marylene Land from [facility] is calling again to get update. Requesting call back.

## 2022-08-25 ENCOUNTER — Telehealth: Payer: Self-pay | Admitting: Internal Medicine

## 2022-08-25 DIAGNOSIS — I10 Essential (primary) hypertension: Secondary | ICD-10-CM

## 2022-08-25 MED ORDER — HYDRALAZINE HCL 10 MG PO TABS: 10.0000 mg | ORAL_TABLET | Freq: Every morning | ORAL | 1 refills | Status: DC

## 2022-08-25 NOTE — Telephone Encounter (Signed)
Returned call and spoke with patient. He states his PCP Dr. Levora Dredge had added Valsartan 80mg  at bedtime last week. Added to medication list.  Patient reports his medications are no carrying him through the night. He reports the following morning BPs upon waking:  162/95 165/90 150/86 178/97 179/99 160/90  He does report having a "slight headache" in the mornings when his readings are high.  He is also concerned about evening BP readings (taken at the same time he takes his Valsartan and second dose of Coreg around 8pm):  102/70 104/64  DBP ranges 58-78, he reports the lowest SBP has been 102 and 104. He denies any symptoms with lower readings, states "I feel fine."  Dr. Ladona Ridgel is out of the office until next week, but will forward to him and Pharm D to review and advise on any medication adjustments/other recommendations.

## 2022-08-25 NOTE — Telephone Encounter (Signed)
Is he going to bed at 8pm when he takes his valsartan and Coreg? If not, would see if he can push his valsartan back to right before bedtime. Usually would increase PM meds to target elevated AM readings, but his night BP is low. Not sure if he's had the accuracy of his cuff verified before, may need appt in office to do so (can schedule with PharmD for BP check).   If he is already taking his valsartan and Coreg right at bedtime, would increase his AM Coreg dose to 6.25mg  as long as his HR has room to do so, hopefully he has recorded his HR at home too.

## 2022-08-25 NOTE — Telephone Encounter (Signed)
Pt c/o medication issue:  1. Name of Medication: Coreg and Valsartan  2. How are you currently taking this medication (dosage and times per day)?   3. Are you having a reaction (difficulty breathing--STAT)?   4. What is your medication issue? Patient said his primary doctor told him to call Dr Ladona Ridgel. He said Dr Manus Gunning says his medicine is not taking him through the night.He says his blood pressure drops during the night

## 2022-08-25 NOTE — Telephone Encounter (Signed)
Returned call to patient and discussed response from pharmacist Margaretmary Dys, RPH-CPP (see message in previous note below).   Patient states he does go to bed within an hour of taking the Valsartan and Coreg around 8pm. He reports his heart rates are around 60-62 and does not go much higher than that.  Will forward back to Pharm D to review and advise if it is still recommended to increased AM Coreg dose.  Referral to [facility] ordered, scheduler to call and schedule appt with patient. Informed patient to bring his home BP cuff to this visit to verify accuracy. Patient verbalized understanding.

## 2022-08-25 NOTE — Telephone Encounter (Signed)
Would not increase AM Coreg dose if his HR is already 60. PM valsartan dose should be targeting AM readings, doesn't really make sense to change timing of this or increase the dose since his PM readings are low.   Could add on low dose hydralazine 10mg  to just take once daily in the AM when his BP is high. He should keep a detailed log of BP readings and the time of day he's checking them so that we can trend BP at his visit.

## 2022-08-25 NOTE — Telephone Encounter (Signed)
Spoke with patient and discussed recommendation from [facility], RPH-CPP:  Would not increase AM Coreg dose if his HR is already 60. PM valsartan dose should be targeting AM readings, doesn't really make sense to change timing of this or increase the dose since his PM readings are low.    Could add on low dose hydralazine 10mg  to just take once daily in the AM when his BP is high. He should keep a detailed log of BP readings and the time of day he's checking them so that we can trend BP at his visit.    Patient agreeable to try hydralazine 10mg  in the AM. Rx sent to pharmacy. Patient verbalized understanding of monitoring BP and keeping detailed log to bring to [facility] visit.  Patient expressed appreciation for assistance today.

## 2022-08-30 ENCOUNTER — Ambulatory Visit: Payer: Medicare Other | Attending: Internal Medicine

## 2022-08-30 ENCOUNTER — Telehealth: Payer: Self-pay | Admitting: Internal Medicine

## 2022-08-30 DIAGNOSIS — Z5181 Encounter for therapeutic drug level monitoring: Secondary | ICD-10-CM

## 2022-08-30 DIAGNOSIS — Z953 Presence of xenogenic heart valve: Secondary | ICD-10-CM | POA: Diagnosis not present

## 2022-08-30 DIAGNOSIS — I4891 Unspecified atrial fibrillation: Secondary | ICD-10-CM

## 2022-08-30 DIAGNOSIS — I824Y9 Acute embolism and thrombosis of unspecified deep veins of unspecified proximal lower extremity: Secondary | ICD-10-CM

## 2022-08-30 DIAGNOSIS — D6859 Other primary thrombophilia: Secondary | ICD-10-CM | POA: Diagnosis not present

## 2022-08-30 LAB — POCT INR: INR: 1.7 — AB (ref 2.0–3.0)

## 2022-08-30 NOTE — Telephone Encounter (Signed)
Pt c/o BP issue: STAT if pt c/o blurred vision, one-sided weakness or slurred speech  1. What are your last 5 BP readings?   1:30 am 155/86 6:30 am  165/92 (took Coreg) 10:20 am 109/62 11:00 am  95/55  2. Are you having any other symptoms (ex. Dizziness, headache, blurred vision, passed out)?   Slight dizziness  3. What is your BP issue?    Wife states patient's BP readings have been dropping and his HR has stayed in the 60's.  Wife stated patient has not started on the new medication (hydrALAZINE (APRESOLINE) 10 MG tablet ) due to the dropping BP readings.

## 2022-08-30 NOTE — Telephone Encounter (Signed)
Patient has had recent medication changes  (see previous phone notes).  He did not start hydralazine. Wife reports patient has been having issues with BP being elevated at night and low during the day.  She reports BP most recently has been better at night--140-150's.  Last night he was awake at 1:30 AM to go to the bathroom.  He was feeling fine.  BP at that time was 155/86.  Patient had not taken Valsartan (takes at bedtime) last night because BP was 104/65.  He did not take PM dose of Coreg either.  He took Valsartan at 1:30 AM.  Readings for today are listed below.  Patient felt slightly dizzy when BP was 95/55.  Also felt dizzy another time when he bent over.  BP at that time was 96/60.  His most recent BP at noon today was 106/62.   Wife is concerned about high readings at night and low readings during the day.  Will forward to PharmD as recent recommendations have come from pharmacist.

## 2022-08-30 NOTE — Patient Instructions (Signed)
TAKE 1 TABLET TODAY  THEN INCREASE TO 1/2 tablet daily, EXCEPT 1 TABLET EVERY WEDNESDAY.  Recheck INR in 3 weeks.  EAT 2 HELPINGS OF GREENS PER WEEK. Coumadin Clinic [phone].

## 2022-08-30 NOTE — Telephone Encounter (Signed)
Spoke with wife and they are concerned with his blood pressure readings. Inquired who his primary cardiologist that he sees. She stated it was Dr. Ladona Ridgel in [place]. Advised that I will send this over to them for review and further recommendations. She was appreciative for the call back.

## 2022-08-30 NOTE — Telephone Encounter (Signed)
That's fine if he hasn't taken hydralazine yet. His BP trends really don't make sense compared to when he's taking his medication. Can try splitting his valsartan and have him take 40mg  in the AM and 40mg  in the PM and make sure he brings in his home cuff to his PharmD appt.

## 2022-08-30 NOTE — Telephone Encounter (Signed)
Patients wife notified

## 2022-08-31 DIAGNOSIS — D509 Iron deficiency anemia, unspecified: Secondary | ICD-10-CM | POA: Diagnosis not present

## 2022-08-31 DIAGNOSIS — E7800 Pure hypercholesterolemia, unspecified: Secondary | ICD-10-CM | POA: Diagnosis not present

## 2022-08-31 DIAGNOSIS — K219 Gastro-esophageal reflux disease without esophagitis: Secondary | ICD-10-CM | POA: Diagnosis not present

## 2022-08-31 DIAGNOSIS — I119 Hypertensive heart disease without heart failure: Secondary | ICD-10-CM | POA: Diagnosis not present

## 2022-08-31 DIAGNOSIS — I2510 Atherosclerotic heart disease of native coronary artery without angina pectoris: Secondary | ICD-10-CM | POA: Diagnosis not present

## 2022-08-31 DIAGNOSIS — I4891 Unspecified atrial fibrillation: Secondary | ICD-10-CM | POA: Diagnosis not present

## 2022-09-11 ENCOUNTER — Telehealth: Payer: Self-pay | Admitting: Internal Medicine

## 2022-09-11 NOTE — Telephone Encounter (Signed)
Pts wife came in for lab work and expressed that her husbands blood pressure is still high. And would like to speak to someone sooner than later. Mentioned not taking certain prescriptions. Please call and advise.

## 2022-09-12 NOTE — Telephone Encounter (Signed)
Left a message to call back.

## 2022-09-12 NOTE — Telephone Encounter (Signed)
There have been a few recent calls in regarding pt's BP. His trends haven't matched up well with when he's taking his BP meds. He is scheduled with PharmD for BP management on 7/15. He should keep this appt and bring in his medications, home BP cuff and log so that we can verify what he's taking and accuracy of his cuff before making med adjustments.

## 2022-09-18 ENCOUNTER — Ambulatory Visit: Payer: Medicare Other | Attending: Cardiovascular Disease | Admitting: Pharmacist

## 2022-09-18 DIAGNOSIS — R42 Dizziness and giddiness: Secondary | ICD-10-CM | POA: Diagnosis not present

## 2022-09-18 DIAGNOSIS — Z862 Personal history of diseases of the blood and blood-forming organs and certain disorders involving the immune mechanism: Secondary | ICD-10-CM

## 2022-09-18 DIAGNOSIS — I10 Essential (primary) hypertension: Secondary | ICD-10-CM

## 2022-09-18 NOTE — Progress Notes (Unsigned)
Patient ID: Sean Brock                 DOB: 06/13/1934                      MRN: [id number]      HPI: Sean Brock is a 88 y.o. male referred by Dr. Ladona Ridgel to HTN clinic. PMH is significant for sinus node dysfunction, s/p PPM, Atrial flutter s/p ablation, remote aortic valve replacement, CABG, CVA, PAD, HLD and HTN. Valsartan 80mg  daily was added by PCP at the end of June for elevated blood pressure. Patient's wife called the clinic 6/26 reporting elevated BP in the AM and low BP in the evening.   Patient presents today to clinic accompanied by his wife.  He brings in an extensive list of blood pressure readings.  Several blood pressure readings from the middle of the night.  He is no longer taking the valsartan due to dizziness.  He also tried the hydralazine but it also caused dizziness.  He is currently taking just carvedilol 3.125 mg twice a day.  Reviewing his blood pressure readings his blood pressure in the mornings around 7 or 8 AM tend to be elevated but his afternoon and evening readings are on the lower end.  However patient is not resting at all prior to checking his blood pressure.  He does bring in his home Om[employer] blood pressure cuff which was found to be accurate.  Takes his carvedilol around 7 to 8 AM and at 8 PM.  Patient has been struggling with iron deficiency anemia for at least the last 6 or 7 months.  He did receive 1 blood transfusion and iron infusion.  His latest labs in April showed improvement in his ferritin and hemoglobin.  Patient reports increase in dizziness.  He also states that the sunlight tends to trigger his dizziness as well as sudden movements.  Current HTN meds: carvedilol 3.125mg  twice a day Previously tried: amlodipine 2.5-10mg , valsartan (dizziness), hydralazine (dizziness) BP goal: <130/80  Exercise: Used to do yard work but not much now due to dizziness   Home BP readings:  139 81 4:30AM  158 85 7:30AM  101 62 11AM  101 50 2PM  134 69 4PM       166 83 4:45AM  104 61 2:30 PM  113 65   106 63 8:30 PM      119 76 2:30AM  158 86 5:30 AM  170 95 7:30 AM  117 71 9:15 AM  125 63 1:15 PM  103 55 2:24 PM      175 98 1:30 AM  166 104 2:20 AM  150 91 7AM  127 77 8:30AM  145 82 3:00 PM          139 86 5:30 AM  165 89 7AM  85 57 9AM  104 60 9AM  108 66 11:10AM  120 70 7PM  131 74 8:25 P<      157 84 7:15AM  110 64 9:15AM  134 71 1:30 PM  142 81 3:30 PM          118 67 8AM  105 56 10:20AM  100 64 2:20 PM  101 59 3PM  125 69 5:20PM    Wt Readings from Last 3 Encounters:  07/04/22 197 lb (89.4 kg)  06/14/22 199 lb 3.2 oz (90.4 kg)  04/05/22 198 lb (89.8 kg)   BP Readings from Last 3 Encounters:  07/04/22 (!) 184/85  06/14/22 (!) 144/70  04/05/22 122/68   Pulse Readings from Last 3 Encounters:  07/04/22 60  06/14/22 61  04/05/22 60    Renal function: CrCl cannot be calculated (Patient's most recent lab result is older than the maximum 21 days allowed.).  Past Medical History:  Diagnosis Date   AORTIC STENOSIS    a. Echo (05/26/13):  Mod LVH, vigorous LVF, EF 65-70%, no RWMA, Gr 1 DD, severe AS (mean 40 mmHg) => s/p bioprosthetic AVR 07/2013   Arthritis    BACK   CAD (coronary artery disease)    a. Lexiscan myoview (5/13) with EF 70%, no ischemia or infarction;   b. LHC (06/2013):  Dist LM 30%, ostial LAD 40-50%, prox LAD 30%, ostial D1 80-90%, prox CFX 40%, RCA occluded with L-R collats => CABG (S-PDA at time of AVR)   CVA (cerebral infarction)    a. 3/14 right parietal infarct. Carotid US 3/14 showed no significant disease. He has an implanted loop recorder to look for atrial fibrillation.    DYSPEPSIA    ED (erectile dysfunction)    Former smoker    GERD (gastroesophageal reflux disease)    History of kidney stones    HYPERLIPIDEMIA    HYPERTENSION    Palpitations    a. Event monitor (5/13) with occasional PVCs, PACs but no significant arrhythmia.    S/P aortic valve replacement with  bioprosthetic valve + CABG x1 07/11/2013   23 mm [facility] Ease bovine pericardial tissue valve;  Echo (08/2013):  EF 55-60%, no RWMA, AVR ok, Asc Ao 40 mm (mildly dilated), mild LAE, mild RVE, lipomatous hypertrophy, mod TR, PASP 36 mmHg   S/P CABG x 1 07/11/2013   SVG to PDA with [facility] via right thigh   Sinus bradycardia    a. 2013 - HR 40's in the office. BB stopped.   Trigeminal neuropathy    Vertigo     Current Outpatient Medications on File Prior to Visit  Medication Sig Dispense Refill   carvedilol (COREG) 3.125 MG tablet Take 1 tablet by mouth once only if blood pressure is greater than 160 on top or 100 on bottom     acetaminophen (TYLENOL) 500 MG tablet Take 500 mg by mouth every 6 (six) hours as needed for mild pain.      aspirin EC 81 MG EC tablet Take 1 tablet (81 mg total) by mouth daily.     B Complex Vitamins (VITAMIN B COMPLEX) TABS daily at 6 (six) AM.     Carboxymethylcellul-Glycerin (CLEAR EYES FOR DRY EYES OP) Place 1 drop into both eyes daily.     Emollient (MEDERMA AG FACE EX) Apply 1 application topically daily as needed (on face for skin cancer).     famotidine (PEPCID) 20 MG tablet Take 20 mg by mouth 2 (two) times daily.     ferrous sulfate 325 (65 FE) MG tablet Take 325 mg by mouth daily with breakfast.     polyethylene glycol (MIRALAX / GLYCOLAX) 17 g packet Take 17 g by mouth daily as needed (constipation.).     rosuvastatin (CRESTOR) 10 MG tablet Take 10 mg by mouth every evening.      tamsulosin (FLOMAX) 0.4 MG CAPS capsule Take 0.4 mg by mouth every evening.      warfarin (COUMADIN) 5 MG tablet TAKE 1/2 (ONE HALF) TO 1 TABLET BY MOUTH ONCE DAILY 60 tablet 0   No current facility-administered medications on file prior to visit.  Allergies  Allergen Reactions   Sudafed [Pseudoephedrine Hcl] Palpitations   Tizanidine     Other Reaction(s): drowsy   Cardizem [Diltiazem Hcl] Rash   Sudafed [Pseudoephedrine] Palpitations    There were no vitals taken for  this visit.   Assessment/Plan:   1. Hypertension -  Essential hypertension Assessment: Patient does have some higher blood pressure readings prior to his a.m. medications however patient is not resting prior to checking and many of these readings are in the middle of the night when he gets up to use the bathroom Bigger concern currently is hypotension Some of his dizziness may be due to low blood pressure but some may also be due to his iron deficiency anemia  Plan: Discussed case with Dr. Ladona Ridgel will check CBC and ferritin today.  Will forward results to [facility] Stop carvedilol Patient instructed not to check his blood pressure in the middle of the night and to check it less frequently Patient educated to rest at least 5 minutes prior to checking his blood pressure Will use carvedilol 3.125 mg as needed if systolic blood pressures greater than 160 or diastolic greater then 100 Advised patient not to check his blood pressure more than twice a day unless he is symptomatic Follow-up as needed   Thank you  Olene Floss, Pharm.D, BCACP, BCPS, CPP [facility] of [facility] [address], [place], [place] [address]  Phone: [phone]; Fax: [phone]

## 2022-09-18 NOTE — Patient Instructions (Addendum)
STOP taking carvedilol.  Only check blood pressure 1-2 times per day Do not take blood pressure in the middle of the night unless you are symptomatic Take carvedilol if blood pressure is >160 on top or >100 on the bottom Make sure you rest 5 or more min before checking blood pressure  Please call me at [phone] with any issues or if no improvement seen   To check your pressure at home you will need to:   Sit up in a chair, with feet flat on the floor and back supported. Do not cross your ankles or legs. Rest your left arm so that the cuff is about heart level. If the cuff goes on your upper arm, then just relax your arm on the table, arm of the chair, or your lap. If you have a wrist cuff, hold your wrist against your chest at heart level. Place the cuff snugly around your arm, about 1 inch above the crease of your elbow. The cords should be inside the groove of your elbow.  Sit quietly, with the cuff in place, for about 5 minutes. Then press the power button to start a reading. Do not talk or move while the reading is taking place.  Record your readings on a sheet of paper. Although most cuffs have a memory, it is often easier to see a pattern developing when the numbers are all in front of you.  You can repeat the reading after 1-3 minutes if it is recommended.   Make sure your bladder is empty and you have not had caffeine or tobacco within the last 30 minutes   Always bring your blood pressure log with you to your appointments. If you have not brought your monitor in to be double checked for accuracy, please bring it to your next appointment.   You can find a list of validated (accurate) blood pressure cuffs at: validatebp.org

## 2022-09-18 NOTE — Assessment & Plan Note (Addendum)
Assessment: Patient does have some higher blood pressure readings prior to his a.m. medications however patient is not resting prior to checking and many of these readings are in the middle of the night when he gets up to use the bathroom Bigger concern currently is hypotension Some of his dizziness may be due to low blood pressure but some may also be due to his iron deficiency anemia  Plan: Discussed case with Dr. Ladona Ridgel will check CBC and ferritin today.  Will forward results to [facility] Stop carvedilol Patient instructed not to check his blood pressure in the middle of the night and to check it less frequently Patient educated to rest at least 5 minutes prior to checking his blood pressure Will use carvedilol 3.125 mg as needed if systolic blood pressures greater than 160 or diastolic greater then 100 Advised patient not to check his blood pressure more than twice a day unless he is symptomatic Follow-up as needed

## 2022-09-19 LAB — FERRITIN: Ferritin: 52 ng/mL (ref 30–400)

## 2022-09-19 LAB — CBC
Hematocrit: 39.3 % (ref 37.5–51.0)
Hemoglobin: 12.5 g/dL — ABNORMAL LOW (ref 13.0–17.7)
MCH: 28.9 pg (ref 26.6–33.0)
MCHC: 31.8 g/dL (ref 31.5–35.7)
MCV: 91 fL (ref 79–97)
Platelets: 199 10*3/uL (ref 150–450)
RBC: 4.32 x10E6/uL (ref 4.14–5.80)
RDW: 13.2 % (ref 11.6–15.4)
WBC: 5.8 10*3/uL (ref 3.4–10.8)

## 2022-09-20 ENCOUNTER — Ambulatory Visit: Payer: Medicare Other | Attending: Internal Medicine

## 2022-09-20 DIAGNOSIS — Z5181 Encounter for therapeutic drug level monitoring: Secondary | ICD-10-CM

## 2022-09-20 DIAGNOSIS — D6859 Other primary thrombophilia: Secondary | ICD-10-CM

## 2022-09-20 DIAGNOSIS — I824Y9 Acute embolism and thrombosis of unspecified deep veins of unspecified proximal lower extremity: Secondary | ICD-10-CM

## 2022-09-20 DIAGNOSIS — Z953 Presence of xenogenic heart valve: Secondary | ICD-10-CM

## 2022-09-20 DIAGNOSIS — I4891 Unspecified atrial fibrillation: Secondary | ICD-10-CM | POA: Diagnosis not present

## 2022-09-20 LAB — POCT INR: INR: 1.8 — AB (ref 2.0–3.0)

## 2022-09-20 NOTE — Patient Instructions (Signed)
TAKE 1.5 TABLETS TODAY ONLY  THEN INCREASE TO 1/2 tablet daily, EXCEPT 1 TABLET EVERY WEDNESDAY AND FRIDAY.  Recheck INR in 3 weeks.  EAT 2 HELPINGS OF GREENS PER WEEK. Coumadin Clinic [phone].

## 2022-09-26 ENCOUNTER — Ambulatory Visit (INDEPENDENT_AMBULATORY_CARE_PROVIDER_SITE_OTHER): Payer: Medicare Other

## 2022-09-26 DIAGNOSIS — I495 Sick sinus syndrome: Secondary | ICD-10-CM | POA: Diagnosis not present

## 2022-09-27 ENCOUNTER — Other Ambulatory Visit: Payer: Self-pay

## 2022-09-27 ENCOUNTER — Emergency Department: Payer: Medicare Other

## 2022-09-27 ENCOUNTER — Observation Stay
Admission: EM | Admit: 2022-09-27 | Discharge: 2022-09-28 | Disposition: A | Discharge status: Home / Self Care | Payer: Medicare Other | Attending: Internal Medicine | Admitting: Internal Medicine

## 2022-09-27 DIAGNOSIS — Z79899 Other long term (current) drug therapy: Secondary | ICD-10-CM | POA: Insufficient documentation

## 2022-09-27 DIAGNOSIS — I700 Atherosclerosis of aorta: Secondary | ICD-10-CM | POA: Diagnosis not present

## 2022-09-27 DIAGNOSIS — R002 Palpitations: Secondary | ICD-10-CM | POA: Diagnosis not present

## 2022-09-27 DIAGNOSIS — Z8673 Personal history of transient ischemic attack (TIA), and cerebral infarction without residual deficits: Secondary | ICD-10-CM | POA: Insufficient documentation

## 2022-09-27 DIAGNOSIS — I471 Supraventricular tachycardia, unspecified: Principal | ICD-10-CM | POA: Insufficient documentation

## 2022-09-27 DIAGNOSIS — E7800 Pure hypercholesterolemia, unspecified: Secondary | ICD-10-CM | POA: Diagnosis present

## 2022-09-27 DIAGNOSIS — Z7982 Long term (current) use of aspirin: Secondary | ICD-10-CM | POA: Insufficient documentation

## 2022-09-27 DIAGNOSIS — Z7901 Long term (current) use of anticoagulants: Secondary | ICD-10-CM | POA: Diagnosis not present

## 2022-09-27 DIAGNOSIS — Z87891 Personal history of nicotine dependence: Secondary | ICD-10-CM | POA: Insufficient documentation

## 2022-09-27 DIAGNOSIS — I2510 Atherosclerotic heart disease of native coronary artery without angina pectoris: Secondary | ICD-10-CM | POA: Diagnosis present

## 2022-09-27 DIAGNOSIS — I25118 Atherosclerotic heart disease of native coronary artery with other forms of angina pectoris: Secondary | ICD-10-CM

## 2022-09-27 DIAGNOSIS — Z951 Presence of aortocoronary bypass graft: Secondary | ICD-10-CM | POA: Diagnosis not present

## 2022-09-27 DIAGNOSIS — I639 Cerebral infarction, unspecified: Secondary | ICD-10-CM | POA: Diagnosis present

## 2022-09-27 DIAGNOSIS — I480 Paroxysmal atrial fibrillation: Secondary | ICD-10-CM | POA: Diagnosis not present

## 2022-09-27 DIAGNOSIS — K219 Gastro-esophageal reflux disease without esophagitis: Secondary | ICD-10-CM | POA: Diagnosis present

## 2022-09-27 DIAGNOSIS — I4719 Other supraventricular tachycardia: Secondary | ICD-10-CM | POA: Diagnosis not present

## 2022-09-27 DIAGNOSIS — Z950 Presence of cardiac pacemaker: Secondary | ICD-10-CM | POA: Insufficient documentation

## 2022-09-27 DIAGNOSIS — Z953 Presence of xenogenic heart valve: Secondary | ICD-10-CM | POA: Diagnosis not present

## 2022-09-27 DIAGNOSIS — R000 Tachycardia, unspecified: Secondary | ICD-10-CM | POA: Diagnosis not present

## 2022-09-27 DIAGNOSIS — I110 Hypertensive heart disease with heart failure: Secondary | ICD-10-CM | POA: Insufficient documentation

## 2022-09-27 DIAGNOSIS — R918 Other nonspecific abnormal finding of lung field: Secondary | ICD-10-CM | POA: Diagnosis not present

## 2022-09-27 DIAGNOSIS — I4891 Unspecified atrial fibrillation: Secondary | ICD-10-CM | POA: Diagnosis present

## 2022-09-27 DIAGNOSIS — I10 Essential (primary) hypertension: Secondary | ICD-10-CM | POA: Diagnosis present

## 2022-09-27 DIAGNOSIS — I5030 Unspecified diastolic (congestive) heart failure: Secondary | ICD-10-CM | POA: Diagnosis not present

## 2022-09-27 DIAGNOSIS — R079 Chest pain, unspecified: Secondary | ICD-10-CM | POA: Diagnosis not present

## 2022-09-27 DIAGNOSIS — I350 Nonrheumatic aortic (valve) stenosis: Secondary | ICD-10-CM

## 2022-09-27 HISTORY — DX: Unspecified atrial fibrillation: I48.91

## 2022-09-27 HISTORY — DX: Unspecified atrial flutter: I48.92

## 2022-09-27 LAB — CUP PACEART REMOTE DEVICE CHECK
Battery Remaining Longevity: 102 mo
Battery Remaining Percentage: 100 %
Brady Statistic RA Percent Paced: 79 %
Brady Statistic RV Percent Paced: 99 %
Date Time Interrogation Session: 20240723034100
Implantable Lead Connection Status: 753985
Implantable Lead Connection Status: 753985
Implantable Lead Implant Date: 20201005
Implantable Lead Implant Date: 20201005
Implantable Lead Location: 753859
Implantable Lead Location: 753860
Implantable Lead Model: 7742
Implantable Lead Model: 7841
Implantable Lead Serial Number: 1017357
Implantable Lead Serial Number: 1068132
Implantable Pulse Generator Implant Date: 20201005
Lead Channel Impedance Value: 557 Ohm
Lead Channel Impedance Value: 681 Ohm
Lead Channel Setting Pacing Amplitude: 2.3 V
Lead Channel Setting Pacing Amplitude: 2.6 V
Lead Channel Setting Pacing Pulse Width: 0.8 ms
Lead Channel Setting Sensing Sensitivity: 1.5 mV
Pulse Gen Serial Number: 890709
Zone Setting Status: 755011

## 2022-09-27 LAB — FERRITIN: Ferritin: 21 ng/mL — ABNORMAL LOW (ref 24–336)

## 2022-09-27 LAB — BASIC METABOLIC PANEL
Anion gap: 7 (ref 5–15)
BUN: 19 mg/dL (ref 8–23)
CO2: 24 mmol/L (ref 22–32)
Calcium: 8.7 mg/dL — ABNORMAL LOW (ref 8.9–10.3)
Chloride: 107 mmol/L (ref 98–111)
Creatinine, Ser: 0.83 mg/dL (ref 0.61–1.24)
GFR, Estimated: >60 mL/min (ref >60)
Glucose, Bld: 154 mg/dL — ABNORMAL HIGH (ref 70–99)
Potassium: 3.8 mmol/L (ref 3.5–5.1)
Sodium: 138 mmol/L (ref 135–145)

## 2022-09-27 LAB — CBC
HCT: 39.4 % (ref 39.0–52.0)
Hemoglobin: 12.8 g/dL — ABNORMAL LOW (ref 13.0–17.0)
MCH: 30 pg (ref 26.0–34.0)
MCHC: 32.5 g/dL (ref 30.0–36.0)
MCV: 92.5 fL (ref 80.0–100.0)
Platelets: 184 10*3/uL (ref 150–400)
RBC: 4.26 MIL/uL (ref 4.22–5.81)
RDW: 14.6 % (ref 11.5–15.5)
WBC: 5.7 10*3/uL (ref 4.0–10.5)
nRBC: 0 % (ref 0.0–0.2)

## 2022-09-27 LAB — TROPONIN I (HIGH SENSITIVITY)
Troponin I (High Sensitivity): 10 ng/L (ref <18)
Troponin I (High Sensitivity): 11 ng/L (ref <18)

## 2022-09-27 LAB — PROTIME-INR
INR: 2.9 — ABNORMAL HIGH (ref 0.8–1.2)
Prothrombin Time: 30.2 seconds — ABNORMAL HIGH (ref 11.4–15.2)

## 2022-09-27 LAB — MAGNESIUM: Magnesium: 2.2 mg/dL (ref 1.7–2.4)

## 2022-09-27 LAB — TSH: TSH: 1.812 u[IU]/mL (ref 0.350–4.500)

## 2022-09-27 MED ORDER — FAMOTIDINE 20 MG PO TABS
20.0000 mg | ORAL_TABLET | Freq: Two times a day (BID) | ORAL | Status: DC
  Administered 2022-09-27 – 2022-09-28 (×2): 20 mg via ORAL
  Filled 2022-09-27 (×2): qty 1

## 2022-09-27 MED ORDER — ACETAMINOPHEN 325 MG PO TABS: 650.0000 mg | ORAL_TABLET | Freq: Four times a day (QID) | ORAL | Status: DC | PRN

## 2022-09-27 MED ORDER — ONDANSETRON HCL 4 MG/2ML IJ SOLN: 4.0000 mg | Freq: Four times a day (QID) | INTRAMUSCULAR | Status: DC | PRN

## 2022-09-27 MED ORDER — ACETAMINOPHEN 650 MG RE SUPP: 650.0000 mg | Freq: Four times a day (QID) | RECTAL | Status: DC | PRN

## 2022-09-27 MED ORDER — POLYETHYLENE GLYCOL 3350 17 G PO PACK: 17.0000 g | PACK | Freq: Every day | ORAL | Status: DC | PRN

## 2022-09-27 MED ORDER — METOPROLOL TARTRATE 5 MG/5ML IV SOLN: 5.0000 mg | INTRAVENOUS | Status: AC | PRN

## 2022-09-27 MED ORDER — TAMSULOSIN HCL 0.4 MG PO CAPS
0.4000 mg | ORAL_CAPSULE | Freq: Every evening | ORAL | Status: DC
  Administered 2022-09-27: 0.4 mg via ORAL
  Filled 2022-09-27: qty 1

## 2022-09-27 MED ORDER — WARFARIN SODIUM 5 MG PO TABS
5.0000 mg | ORAL_TABLET | ORAL | Status: DC
  Administered 2022-09-27: 5 mg via ORAL
  Filled 2022-09-27: qty 1

## 2022-09-27 MED ORDER — HEPARIN SODIUM (PORCINE) 5000 UNIT/ML IJ SOLN: 5000.0000 [IU] | Freq: Three times a day (TID) | INTRAMUSCULAR | Status: DC

## 2022-09-27 MED ORDER — FERROUS SULFATE 325 (65 FE) MG PO TABS
325.0000 mg | ORAL_TABLET | Freq: Every day | ORAL | Status: DC
  Administered 2022-09-28: 325 mg via ORAL
  Filled 2022-09-27: qty 1

## 2022-09-27 MED ORDER — METOPROLOL TARTRATE 25 MG PO TABS
25.0000 mg | ORAL_TABLET | Freq: Two times a day (BID) | ORAL | Status: DC
  Administered 2022-09-27 – 2022-09-28 (×3): 25 mg via ORAL
  Filled 2022-09-27 (×3): qty 1

## 2022-09-27 MED ORDER — WARFARIN - PHARMACIST DOSING INPATIENT
Freq: Every day | Status: DC
  Filled 2022-09-27: qty 1

## 2022-09-27 MED ORDER — METOPROLOL TARTRATE 5 MG/5ML IV SOLN
INTRAVENOUS | Status: AC
  Administered 2022-09-27: 5 mg via INTRAVENOUS
  Filled 2022-09-27: qty 10

## 2022-09-27 MED ORDER — METOPROLOL TARTRATE 5 MG/5ML IV SOLN: 10.0000 mg | INTRAVENOUS | Status: AC | PRN

## 2022-09-27 MED ORDER — ROSUVASTATIN CALCIUM 10 MG PO TABS
10.0000 mg | ORAL_TABLET | Freq: Every day | ORAL | Status: DC
  Administered 2022-09-27: 10 mg via ORAL
  Filled 2022-09-27: qty 1

## 2022-09-27 MED ORDER — WARFARIN SODIUM 2.5 MG PO TABS
2.5000 mg | ORAL_TABLET | ORAL | Status: DC
  Filled 2022-09-27: qty 1

## 2022-09-27 MED ORDER — ONDANSETRON HCL 4 MG PO TABS: 4.0000 mg | ORAL_TABLET | Freq: Four times a day (QID) | ORAL | Status: DC | PRN

## 2022-09-27 MED ORDER — SENNOSIDES-DOCUSATE SODIUM 8.6-50 MG PO TABS: 1.0000 | ORAL_TABLET | Freq: Every evening | ORAL | Status: DC | PRN

## 2022-09-27 MED ORDER — LABETALOL HCL 5 MG/ML IV SOLN: 5.0000 mg | INTRAVENOUS | Status: DC | PRN

## 2022-09-27 MED ORDER — METOPROLOL TARTRATE 5 MG/5ML IV SOLN
2.5000 mg | Freq: Once | INTRAVENOUS | Status: AC
  Administered 2022-09-27: 2.5 mg via INTRAVENOUS
  Filled 2022-09-27: qty 5

## 2022-09-27 MED ORDER — METOPROLOL TARTRATE 5 MG/5ML IV SOLN
INTRAVENOUS | Status: AC
  Administered 2022-09-27: 10 mg via INTRAVENOUS
  Filled 2022-09-27: qty 10

## 2022-09-27 MED ORDER — SODIUM CHLORIDE 0.9 % IV BOLUS
500.0000 mL | Freq: Once | INTRAVENOUS | Status: AC
  Administered 2022-09-27: 500 mL via INTRAVENOUS

## 2022-09-27 MED ORDER — ASPIRIN 81 MG PO TBEC
81.0000 mg | DELAYED_RELEASE_TABLET | Freq: Every day | ORAL | Status: DC
  Administered 2022-09-28: 81 mg via ORAL
  Filled 2022-09-27: qty 1

## 2022-09-27 MED ORDER — METOPROLOL TARTRATE 5 MG/5ML IV SOLN
5.0000 mg | INTRAVENOUS | Status: AC | PRN
  Administered 2022-09-27: 5 mg via INTRAVENOUS

## 2022-09-27 NOTE — Assessment & Plan Note (Signed)
Famotidine 20 mg p.o. twice daily

## 2022-09-27 NOTE — Hospital Course (Signed)
[name] is a 88 year old male with history of CAD status post CABG, hypertension, hyperlipidemia, atrial fibrillation on warfarin, status postcardiac pacemaker implantation, who presents to the emergency department who presents to the emergency department for chief concerns of intermittent chest pain with tachycardia.  Vitals in the ED showed temperature of 98.3, respiration rate of 20, heart rate of 122, blood pressure 76/65, SpO2 97% on room air.  Serum sodium is 138, potassium 3.8, chloride 107, bicarb 24, BUN of 19, serum creatinine of 0.83, eGFR > 60, nonfasting blood glucose 154, WBC 5.3, hemoglobin 12.8, platelets of 184.  High sensitive troponin was 10 and on repeat was 11.  ED treatment: Metoprolol 2.5 mg IV one-time dose at 11:35 AM, metoprolol 500 mg IV 2 times dose at 1239 and 12:45 PM, metoprolol 10 mg IV one-time dose at 1256.  Metoprolol 25 mg p.o., sodium chloride 500 mL bolus.

## 2022-09-27 NOTE — Assessment & Plan Note (Addendum)
EDP consulted cardiology and per EDP, cardiology recommends admission with as needed metoprolol IV push until metoprolol can be titrated for heart rate control Home metoprolol tartrate 25 mg p.o. twice daily initiated Admit to PCU, inpatient

## 2022-09-27 NOTE — ED Triage Notes (Signed)
Pt to ED for tachycardia since this morning, pt has atrial and ventricular pacemaker. No dizziness, SOB, N/V at this time. Pt has felt pressure in mid chest since got up at 0600, no radiation. Hx a fib, CAD.   Pressure in chest has been intermittent, currently 0/10.   Skin dry.

## 2022-09-27 NOTE — Assessment & Plan Note (Addendum)
Echo on 04/28/2022: read as Grade 2 diastolic dysfunction.  Estimated ejection fraction 55 to 60%

## 2022-09-27 NOTE — Consult Note (Signed)
ANTICOAGULATION CONSULT NOTE - Initial Consult  Pharmacy Consult for warfarin Indication: valvular atrial fibrillation - aortic valve replacement with bioprosthetic valve   Allergies  Allergen Reactions   Sudafed [Pseudoephedrine Hcl] Palpitations   Tizanidine     Other Reaction(s): drowsy   Cardizem [Diltiazem Hcl] Rash   Sudafed [Pseudoephedrine] Palpitations    Patient Measurements: Height: 6\' 3"  (190.5 cm) Weight: 85.3 kg (188 lb) IBW/kg (Calculated) : 84.5   Vital Signs: Temp: 98.3 F (36.8 C) (07/24 0931) Temp Source: Oral (07/24 0931) BP: 131/100 (07/24 1336) Pulse Rate: 109 (07/24 1409)  Labs: Recent Labs    09/27/22 0936 09/27/22 1006 09/27/22 1302  HGB 12.8*  --   --   HCT 39.4  --   --   PLT 184  --   --   LABPROT  --  30.2*  --   INR  --  2.9*  --   CREATININE 0.83  --   --   TROPONINIHS 10  --  11    Estimated Creatinine Clearance: 76.4 mL/min (by C-G formula based on SCr of 0.83 mg/dL).   Medical History: Past Medical History:  Diagnosis Date   AORTIC STENOSIS    a. Echo (05/26/13):  Mod LVH, vigorous LVF, EF 65-70%, no RWMA, Gr 1 DD, severe AS (mean 40 mmHg) => s/p bioprosthetic AVR 07/2013   Arthritis    BACK   Atrial fib/flutter, transient (HCC)    CAD (coronary artery disease)    a. Lexiscan myoview (5/13) with EF 70%, no ischemia or infarction;   b. LHC (06/2013):  Dist LM 30%, ostial LAD 40-50%, prox LAD 30%, ostial D1 80-90%, prox CFX 40%, RCA occluded with L-R collats => CABG (S-PDA at time of AVR)   CVA (cerebral infarction)    a. 3/14 right parietal infarct. Carotid US 3/14 showed no significant disease. He has an implanted loop recorder to look for atrial fibrillation.    DYSPEPSIA    ED (erectile dysfunction)    Former smoker    GERD (gastroesophageal reflux disease)    History of kidney stones    HYPERLIPIDEMIA    HYPERTENSION    Palpitations    a. Event monitor (5/13) with occasional PVCs, PACs but no significant arrhythmia.     S/P aortic valve replacement with bioprosthetic valve + CABG x1 07/11/2013   23 mm [facility] Ease bovine pericardial tissue valve;  Echo (08/2013):  EF 55-60%, no RWMA, AVR ok, Asc Ao 40 mm (mildly dilated), mild LAE, mild RVE, lipomatous hypertrophy, mod TR, PASP 36 mmHg   S/P CABG x 1 07/11/2013   SVG to PDA with [facility] via right thigh   Sinus bradycardia    a. 2013 - HR 40's in the office. BB stopped.   Trigeminal neuropathy    Vertigo     Medications:  PTA: warfarin regimen (5 mg tabs) - recently increased 09/20/22 at anti-coagulation visit  5 mg Wed/Friday 2.5 mg all other days TWD = 22.5 mg   Assessment: 88 y.o. male with PMH of hypertension, hyperlipidemia, CAD, stroke, and atrial fibrillation s/p aortic valve replacement with bioprosthetic valve  on Coumadin regimen outlined above presented to ED with palpitations. Pharmacy has been consulted to manage warfarin therapy during admission. INR 2.9 (therapeutic) on admission  DDI: reviewed 7/24: none  Date INR Warfarin Dose  7/23 -- 2.5 mg (PTA) 7/24 2.9 5 mg   Goal of Therapy:  INR 2-3 Monitor platelets by anticoagulation protocol: Yes   Plan:  INR therapeutic on admission (2.9) Continue with home regimen of 5 mg x 1 tonight Continue with daily INR/CBC for now  Sharen Hones, PharmD, BCPS Clinical Pharmacist   09/27/2022,2:25 PM

## 2022-09-27 NOTE — Assessment & Plan Note (Signed)
-   Warfarin per pharmacy ordered 

## 2022-09-27 NOTE — Assessment & Plan Note (Signed)
Aspirin 81 mg daily, warfarin per pharmacy

## 2022-09-27 NOTE — ED Provider Notes (Signed)
[facility] Provider Note    Event Date/Time   First MD Initiated Contact with Patient 09/27/22 [id number]     (approximate)   History   Chief Complaint Tachycardia   HPI  Sean Brock is a 88 y.o. male with past medical history of hypertension, hyperlipidemia, CAD, stroke, and atrial fibrillation on Coumadin who presents to the ED complaining of palpitations.  Patient reports that since he got up this morning he has felt like his heart is racing consistently.  He describes some mild pressure in his chest associated with this but has not had any difficulty breathing.  He states he was feeling well when he went to bed last night, denies any recent fevers, cough, nausea, vomiting, diarrhea, or dysuria.     Physical Exam   Triage Vital Signs: ED Triage Vitals  Encounter Vitals Group     BP 09/27/22 0931 (!) 76/65     Systolic BP Percentile --      Diastolic BP Percentile --      Pulse Rate 09/27/22 0931 (!) 122     Resp 09/27/22 0931 20     Temp 09/27/22 0931 98.3 F (36.8 C)     Temp Source 09/27/22 0931 Oral     SpO2 09/27/22 0931 97 %     Weight 09/27/22 0933 188 lb (85.3 kg)     Height 09/27/22 0933 6\' 3"  (1.905 m)     Head Circumference --      Peak Flow --      Pain Score 09/27/22 0932 0     Pain Loc --      Pain Education --      Exclude from Growth Chart --     Most recent vital signs: Vitals:   09/27/22 1300 09/27/22 1336  BP: (!) 141/110 (!) 131/100  Pulse: (!) 110 (!) 111  Resp: 16 18  Temp:    SpO2: 99% 99%    Constitutional: Alert and oriented. Eyes: Conjunctivae are normal. Head: Atraumatic. Nose: No congestion/rhinnorhea. Mouth/Throat: Mucous membranes are moist.  Cardiovascular: Tachycardic, regular rhythm. Grossly normal heart sounds.  2+ radial pulses bilaterally. Respiratory: Normal respiratory effort.  No retractions. Lungs CTAB. Gastrointestinal: Soft and nontender. No distention. Musculoskeletal: No lower  extremity tenderness nor edema.  Neurologic:  Normal speech and language. No gross focal neurologic deficits are appreciated.    ED Results / Procedures / Treatments   Labs (all labs ordered are listed, but only abnormal results are displayed) Labs Reviewed  BASIC METABOLIC PANEL - Abnormal; Notable for the following components:      Result Value   Glucose, Bld 154 (*)    Calcium 8.7 (*)    All other components within normal limits  CBC - Abnormal; Notable for the following components:   Hemoglobin 12.8 (*)    All other components within normal limits  PROTIME-INR - Abnormal; Notable for the following components:   Prothrombin Time 30.2 (*)    INR 2.9 (*)    All other components within normal limits  MAGNESIUM  TSH  TROPONIN I (HIGH SENSITIVITY)  TROPONIN I (HIGH SENSITIVITY)     EKG  ED ECG REPORT I, Chesley Noon, the attending physician, personally viewed and interpreted this ECG.   Date: 09/27/2022  EKG Time: 9:36  Rate: 123  Rhythm: Ventricular paced rhythm  Axis: Normal  Intervals:nonspecific intraventricular conduction delay  ST&T Change: None  RADIOLOGY Chest x-ray reviewed and interpreted by me with no infiltrate or edema,  small effusion noted.  PROCEDURES:  Critical Care performed: No  Procedures   MEDICATIONS ORDERED IN ED: Medications  metoprolol tartrate (LOPRESSOR) tablet 25 mg (25 mg Oral Given 09/27/22 1335)  acetaminophen (TYLENOL) tablet 650 mg (has no administration in time range)    Or  acetaminophen (TYLENOL) suppository 650 mg (has no administration in time range)  ondansetron (ZOFRAN) tablet 4 mg (has no administration in time range)    Or  ondansetron (ZOFRAN) injection 4 mg (has no administration in time range)  heparin injection 5,000 Units (has no administration in time range)  senna-docusate (Senokot-S) tablet 1 tablet (has no administration in time range)  sodium chloride 0.9 % bolus 500 mL (500 mLs Intravenous New Bag/Given  09/27/22 1001)  metoprolol tartrate (LOPRESSOR) injection 2.5 mg (2.5 mg Intravenous Given 09/27/22 1135)  metoprolol tartrate (LOPRESSOR) injection 5 mg (5 mg Intravenous Given 09/27/22 1245)  metoprolol tartrate (LOPRESSOR) injection 10 mg (10 mg Intravenous Given 09/27/22 1256)     IMPRESSION / MDM / ASSESSMENT AND PLAN / ED COURSE  I reviewed the triage vital signs and the nursing notes.                              88 y.o. male with past medical history of hypertension, hyperlipidemia, CAD, atrial fibrillation on Coumadin, and stroke who presents to the ED with persistent palpitations since getting up this morning associated with mild chest pressure.  Patient's presentation is most consistent with acute presentation with potential threat to life or bodily function.  Differential diagnosis includes, but is not limited to, arrhythmia, pacemaker malfunction, ACS, PE, pneumonia, pneumothorax, anemia, electrolyte abnormality, AKI.  Patient nontoxic-appearing and in no acute distress, vital signs remarkable for tachycardia and patient was initially hypertensive in triage, although blood pressure improved on recheck without intervention.  We will hydrate with IV fluids, EKG shows ventricular paced rhythm but rate consistently at 122-121.  Lab results are pending at this time, case reviewed with Dr. Mariah Milling of cardiology, who recommends interrogation of his pacemaker for possible atrial flutter.  With his recent subtherapeutic INR, we will need to confirm that arrhythmia began within the past 48 hours before proceeding with potential cardioversion.  Pacemaker interrogation reveals atrial tachycardia following review with Dr. Mariah Milling of cardiology.  Patient's blood pressure improved and he was given multiple doses of IV metoprolol, will occasionally have improvement in heart rate but then again goes back up into the 110's.  Labs without significant anemia, leukocytosis, tract abnormality, or AKI.  Troponin  within normal limits and INR therapeutic at 2.9.  Cardiology recommends admission to the hospital so that metoprolol dose may be titrated and patient may be observed.  Case discussed with hospitalist for admission.      FINAL CLINICAL IMPRESSION(S) / ED DIAGNOSES   Final diagnoses:  Atrial tachycardia  Palpitations     Rx / DC Orders   ED Discharge Orders     None        Note:  This document was prepared using Dragon voice recognition software and may include unintentional dictation errors.   Chesley Noon, MD 09/27/22 1355

## 2022-09-27 NOTE — Assessment & Plan Note (Signed)
Rosuvastatin resumed

## 2022-09-27 NOTE — H&P (Signed)
History and Physical   Sean Brock NFA:[id number] DOB: 04-28-1934 DOA: 09/27/2022  PCP: Blair Heys, MD  Outpatient Specialists: [facility] cardiology, Dr. Ladona Ridgel Patient coming from: home  I have personally briefly reviewed patient's old medical records in [facility] EMR.  Chief Concern: chest pain  HPI: [name] is a 88 year old male with history of CAD status post CABG, hypertension, hyperlipidemia, atrial fibrillation on warfarin, status postcardiac pacemaker implantation, who presents to the emergency department who presents to the emergency department for chief concerns of intermittent chest pain with tachycardia.  Vitals in the ED showed temperature of 98.3, respiration rate of 20, heart rate of 122, blood pressure 76/65, SpO2 97% on room air.  Serum sodium is 138, potassium 3.8, chloride 107, bicarb 24, BUN of 19, serum creatinine of 0.83, eGFR > 60, nonfasting blood glucose 154, WBC 5.3, hemoglobin 12.8, platelets of 184.  High sensitive troponin was 10 and on repeat was 11.  ED treatment: Metoprolol 2.5 mg IV one-time dose at 11:35 AM, metoprolol 500 mg IV 2 times dose at 1239 and 12:45 PM, metoprolol 10 mg IV one-time dose at 1256.  Metoprolol 25 mg p.o., sodium chloride 500 mL bolus. ---------------------------- At bedside, he is able to tell me his name, age, current location, current year.  He sees Dr. Ladona Ridgel and was told to stop his coreg two weeks ago because it was dropping his blood pressure too low.  This morning while he was urinating, he noticed that he developed an increased heart rate and this did not resolve.  He denies chest pain, shortness of breath, abdominal pain, dysuria, hematuria, diarrhea, swelling of his lower extremities, syncope, loss of consciousness.  He denies trauma to his person.  Social history: He lives at home with his spouse. He denies tobacco, etoh, and recreational drug use. He is retired, and formerly worked as  Paediatric nurse.  ROS: Constitutional: no weight change, no fever ENT/Mouth: no sore throat, no rhinorrhea Eyes: no eye pain, no vision changes Cardiovascular: no chest pain, no dyspnea,  no edema, + palpitations Respiratory: no cough, no sputum, no wheezing Gastrointestinal: no nausea, no vomiting, no diarrhea, no constipation Genitourinary: no urinary incontinence, no dysuria, no hematuria Musculoskeletal: no arthralgias, no myalgias Skin: no skin lesions, no pruritus, Neuro: + weakness, no loss of consciousness, no syncope Psych: no anxiety, no depression, no decrease appetite Heme/Lymph: no bruising, no bleeding  ED Course: Discussed with emergency medicine provider, patient requiring hospitalization for chief concerns of atrial tachycardia.  Assessment/Plan  Principal Problem:   Atrial tachycardia Active Problems:   Pure hypercholesterolemia   Essential hypertension   Severe aortic stenosis   CAD (coronary artery disease)   Cerebral infarction (HCC)   GERD (gastroesophageal reflux disease)   S/P aortic valve replacement with bioprosthetic valve + CABG x1   S/P CABG x 1   Atrial fibrillation [I48.91]   Heart failure with preserved ejection fraction (HCC)   Palpitations   Assessment and Plan:  * Atrial tachycardia EDP consulted cardiology and per EDP, cardiology recommends admission with as needed metoprolol IV push until metoprolol can be titrated for heart rate control Home metoprolol tartrate 25 mg p.o. twice daily initiated Admit to PCU, inpatient  Heart failure with preserved ejection fraction (HCC) Echo on 04/28/2022: read as Grade 2 diastolic dysfunction.  Estimated ejection fraction 55 to 60%  Atrial fibrillation [I48.91] Home metoprolol p.o. 25 mg twice daily, warfarin per pharmacy resumed on admission  S/P CABG x 1 Aspirin, rosuvastatin, warfarin  S/P aortic valve replacement with bioprosthetic valve + CABG x1 Warfarin per pharmacy ordered  GERD  (gastroesophageal reflux disease) Famotidine 20 mg p.o. twice daily  Cerebral infarction (HCC) Aspirin 81 mg daily, warfarin per pharmacy  CAD (coronary artery disease) Rosuvastatin 10 mg nightly, aspirin 81 mg daily, resumed on admission  Essential hypertension Labetalol 5 mg IV every 4 hours as needed for SBP > 170, 3 doses ordered Patient has not tolerant of ARB's/hydralazine  Pure hypercholesterolemia Rosuvastatin resumed  Chart reviewed.   DVT prophylaxis: Warfarin per pharmacy Code Status: Full code Diet: Heart healthy Family Communication: Updated patient's spouse at bedsise Disposition Plan: Pending clinical course and cardiology recommendation Consults called: Cardiology Admission status: PCU, inpatient  Past Medical History:  Diagnosis Date   AORTIC STENOSIS    a. Echo (05/26/13):  Mod LVH, vigorous LVF, EF 65-70%, no RWMA, Gr 1 DD, severe AS (mean 40 mmHg) => s/p bioprosthetic AVR 07/2013   Arthritis    BACK   Atrial fib/flutter, transient (HCC)    CAD (coronary artery disease)    a. Lexiscan myoview (5/13) with EF 70%, no ischemia or infarction;   b. LHC (06/2013):  Dist LM 30%, ostial LAD 40-50%, prox LAD 30%, ostial D1 80-90%, prox CFX 40%, RCA occluded with L-R collats => CABG (S-PDA at time of AVR)   CVA (cerebral infarction)    a. 3/14 right parietal infarct. Carotid US 3/14 showed no significant disease. He has an implanted loop recorder to look for atrial fibrillation.    DYSPEPSIA    ED (erectile dysfunction)    Former smoker    GERD (gastroesophageal reflux disease)    History of kidney stones    HYPERLIPIDEMIA    HYPERTENSION    Palpitations    a. Event monitor (5/13) with occasional PVCs, PACs but no significant arrhythmia.    S/P aortic valve replacement with bioprosthetic valve + CABG x1 07/11/2013   23 mm [facility] Ease bovine pericardial tissue valve;  Echo (08/2013):  EF 55-60%, no RWMA, AVR ok, Asc Ao 40 mm (mildly dilated), mild LAE, mild  RVE, lipomatous hypertrophy, mod TR, PASP 36 mmHg   S/P CABG x 1 07/11/2013   SVG to PDA with [facility] via right thigh   Sinus bradycardia    a. 2013 - HR 40's in the office. BB stopped.   Trigeminal neuropathy    Vertigo    Past Surgical History:  Procedure Laterality Date   A-FLUTTER ABLATION N/A 11/13/2018   Procedure: A-FLUTTER ABLATION;  Surgeon: Marinus Maw, MD;  [facility];  Service: Cardiovascular;  Laterality: N/A;   ABDOMINAL AORTOGRAM W/LOWER EXTREMITY N/A 02/03/2021   Procedure: ABDOMINAL AORTOGRAM W/LOWER EXTREMITY;  Surgeon: Cephus Shelling, MD;  [facility];  Service: Cardiovascular;  Laterality: N/A;   AORTIC VALVE REPLACEMENT N/A 07/11/2013   Procedure: AORTIC VALVE REPLACEMENT (AVR);  Surgeon: Purcell Nails, MD;  [facility];  Service: Open Heart Surgery;  Laterality: N/A;   CATARACT EXTRACTION     CORONARY ARTERY BYPASS GRAFT N/A 07/11/2013   Procedure: CORONARY ARTERY BYPASS GRAFTING (CABG) x1;  Surgeon: Purcell Nails, MD;  [facility];  Service: Open Heart Surgery;  Laterality: N/A;   INGUINAL HERNIA REPAIR Right 01/15/2013   Procedure: HERNIA REPAIR INGUINAL ADULT;  Surgeon: Shelly Rubenstein, MD;  Location: [facility];  Service: General;  Laterality: Right;   INSERTION OF MESH Right 01/15/2013   Procedure: INSERTION OF MESH;  Surgeon: Shelly Rubenstein, MD;  Location: [facility];  Service: General;  Laterality: Right;   INTRAOPERATIVE TRANSESOPHAGEAL ECHOCARDIOGRAM N/A 07/11/2013   Procedure: INTRAOPERATIVE TRANSESOPHAGEAL ECHOCARDIOGRAM;  Surgeon: Purcell Nails, MD;  Location: [facility] OR;  Service: Open Heart Surgery;  Laterality: N/A;   LEFT AND RIGHT HEART CATHETERIZATION WITH CORONARY ANGIOGRAM N/A 06/06/2013   Procedure: LEFT AND RIGHT HEART CATHETERIZATION WITH CORONARY ANGIOGRAM;  Surgeon: Laurey Morale, MD;  Location: [facility] CATH LAB;  Service: Cardiovascular;  Laterality: N/A;   LITHOTRIPSY      LOOP RECORDER IMPLANT  04/15/2013   MDT LinQ implanred by Dr Ladona Ridgel for cryptogenic stroke   LOOP RECORDER IMPLANT N/A 04/14/2013   Procedure: LOOP RECORDER IMPLANT;  Surgeon: Marinus Maw, MD;  Location: [facility] CATH LAB;  Service: Cardiovascular;  Laterality: N/A;   LOOP RECORDER REMOVAL N/A 11/13/2018   Procedure: LOOP RECORDER REMOVAL;  Surgeon: Marinus Maw, MD;  [facility];  Service: Cardiovascular;  Laterality: N/A;   MOUTH SURGERY     PACEMAKER IMPLANT N/A 12/09/2018   Procedure: PACEMAKER IMPLANT;  Surgeon: Marinus Maw, MD;  [facility];  Service: Cardiovascular;  Laterality: N/A;   PERIPHERAL VASCULAR INTERVENTION Left 02/03/2021   Procedure: PERIPHERAL VASCULAR INTERVENTION;  Surgeon: Cephus Shelling, MD;  [facility];  Service: Cardiovascular;  Laterality: Left;  sfa   Social History:  reports that he quit smoking about 13 years ago. His smoking use included cigarettes. He started smoking about 43 years ago. He has a 30 pack-year smoking history. He has never used smokeless tobacco. He reports that he does not drink alcohol and does not use drugs.  Allergies  Allergen Reactions   Sudafed [Pseudoephedrine Hcl] Palpitations   Tizanidine     Other Reaction(s): drowsy   Cardizem [Diltiazem Hcl] Rash   Sudafed [Pseudoephedrine] Palpitations   Family History  Problem Relation Age of Onset   Diabetes Mother    Heart attack Mother    Heart attack Father    Hypertension Father    Diabetes Unknown    Hypertension Unknown    Lung cancer Unknown    Cancer Sister        breast   Stroke Neg Hx    Family history: Family history reviewed and not pertinent.  Prior to Admission medications   Medication Sig Start Date End Date Taking? Authorizing Provider  acetaminophen (TYLENOL) 500 MG tablet Take 500 mg by mouth every 6 (six) hours as needed for mild pain.    Yes [provider]  aspirin EC 81 MG EC tablet Take 1  tablet (81 mg total) by mouth daily. 07/15/13  Yes Gold, Wayne E, PA-C  B Complex Vitamins (VITAMIN B COMPLEX) TABS daily at 6 (six) AM.   Yes [provider]  Carboxymethylcellul-Glycerin (CLEAR EYES FOR DRY EYES OP) Place 1 drop into both eyes daily.   Yes [provider]  carvedilol (COREG) 3.125 MG tablet Take 1 tablet by mouth once only if blood pressure is greater than 160 on top or 100 on bottom 09/18/22  Yes Marinus Maw, MD  Emollient [facility] AG FACE EX) Apply 1 application topically daily as needed (on face for skin cancer).   Yes [provider]  famotidine (PEPCID) 20 MG tablet Take 20 mg by mouth 2 (two) times daily.   Yes [provider]  ferrous sulfate 325 (65 FE) MG tablet Take 325 mg by mouth daily  with breakfast.   Yes [provider]  polyethylene glycol (MIRALAX / GLYCOLAX) 17 g packet Take 17 g by mouth daily as needed (constipation.).   Yes [provider]  rosuvastatin (CRESTOR) 10 MG tablet Take 10 mg by mouth every evening.    Yes [provider]  tamsulosin (FLOMAX) 0.4 MG CAPS capsule Take 0.4 mg by mouth every evening.  03/18/15  Yes [provider]  warfarin (COUMADIN) 5 MG tablet TAKE 1/2 (ONE HALF) TO 1 TABLET BY MOUTH ONCE DAILY 08/03/22  Yes Marinus Maw, MD  hydrALAZINE (APRESOLINE) 10 MG tablet Take 10 mg by mouth every morning. Patient not taking: Reported on 09/27/2022    [provider]  valsartan (DIOVAN) 80 MG tablet Take 80 mg by mouth daily. Patient not taking: Reported on 09/27/2022    [provider]   Physical Exam: Vitals:   09/27/22 1600 09/27/22 1615 09/27/22 1628 09/27/22 1814  BP:   123/74 (!) 165/76  Pulse: 60 93  (!) 59  Resp: 16 14  18   Temp:    97.6 F (36.4 C)  TempSrc:      SpO2: 99% 100%  99%  Weight:      Height:       Constitutional: appears age-appropriate, NAD, calm Eyes: PERRL, lids and conjunctivae normal ENMT: Mucous membranes are  moist. Posterior pharynx clear of any exudate or lesions. Age-appropriate dentition. Hearing appropriate Neck: normal, supple, no masses, no thyromegaly Respiratory: clear to auscultation bilaterally, no wheezing, no crackles. Normal respiratory effort. No accessory muscle use.  Cardiovascular: Regular rate and rhythm, no murmurs / rubs / gallops. No extremity edema. 2+ pedal pulses. No carotid bruits.  Abdomen: no tenderness, no masses palpated, no hepatosplenomegaly. Bowel sounds positive.  Musculoskeletal: no clubbing / cyanosis. No joint deformity upper and lower extremities. Good ROM, no contractures, no atrophy. Normal muscle tone.  Skin: no rashes, lesions, ulcers. No induration Neurologic: Sensation intact. Strength 5/5 in all 4.  Psychiatric: Normal judgment and insight. Alert and oriented x 3. Normal mood.   EKG: independently reviewed, showing atrial tachycardia with rate of 113, QTc 479  Chest x-ray on Admission: I personally reviewed and I agree with radiologist reading as below.  CUP PACEART REMOTE DEVICE CHECK  Result Date: 09/27/2022 Scheduled remote reviewed. Normal device function.  8 AHR detections totaling <1%, longest lasting 14 seconds, EGMs consistent with paroxysmal atrial flutter.  Known history of AF, on warfarin. Next remote 91 days. - CS, CVRS  DG Chest Portable 1 View  Result Date: 09/27/2022 CLINICAL DATA:  Chest pain EXAM: PORTABLE CHEST 1 VIEW COMPARISON:  12/20/2018 x-ray FINDINGS: Sternal wires. Normal cardiopericardial silhouette. Calcified aorta. No consolidation, edema or pneumothorax. Left upper chest pacemaker. Tiny right effusion versus pleural thickening. IMPRESSION: Postop chest.  Tiny right effusion versus pleural thickening. Electronically Signed   By: Karen Kays M.D.   On: 09/27/2022 10:18    Labs on Admission: I have personally reviewed following labs CBC: Recent Labs  Lab 09/27/22 0936  WBC 5.7  HGB 12.8*  HCT 39.4  MCV 92.5  PLT 184    Basic Metabolic Panel: Recent Labs  Lab 09/27/22 0936  NA 138  K 3.8  CL 107  CO2 24  GLUCOSE 154*  BUN 19  CREATININE 0.83  CALCIUM 8.7*  MG 2.2   GFR: Estimated Creatinine Clearance: 76.4 mL/min (by C-G formula based on SCr of 0.83 mg/dL).  Coagulation Profile: Recent Labs  Lab 09/27/22 1006  INR  2.9*   Thyroid Function Tests: Recent Labs    09/27/22 0936  TSH 1.812   Anemia Panel: Recent Labs    09/27/22 1302  FERRITIN 21*   Urine analysis:    Component Value Date/Time   COLORURINE YELLOW 02/02/2015 1408   APPEARANCEUR CLEAR 02/02/2015 1408   LABSPEC 1.016 02/02/2015 1408   PHURINE 6.0 02/02/2015 1408   GLUCOSEU NEGATIVE 02/02/2015 1408   HGBUR NEGATIVE 02/02/2015 1408   BILIRUBINUR NEGATIVE 02/02/2015 1408   KETONESUR NEGATIVE 02/02/2015 1408   PROTEINUR NEGATIVE 02/02/2015 1408   UROBILINOGEN 1.0 07/07/2013 1338   NITRITE NEGATIVE 02/02/2015 1408   LEUKOCYTESUR NEGATIVE 02/02/2015 1408   This document was prepared using Dragon Voice Recognition software and may include unintentional dictation errors.  Dr. Sedalia Muta [facility]  If 7PM-7AM, please contact overnight-coverage provider If 7AM-7PM, please contact day attending provider [email or web address]  09/27/2022, 8:04 PM

## 2022-09-27 NOTE — ED Notes (Signed)
Cardiologist and NP is at the bedside.

## 2022-09-27 NOTE — Consult Note (Signed)
ELECTROPHYSIOLOGY CONSULT NOTE    Patient ID: Sean Brock MRN: [id number], DOB/AGE: Dec 27, 1934 88 y.o.  Admit date: 09/27/2022 Date of Consult: 09/27/2022  Primary Physician: Blair Heys, MD Primary Cardiologist: None  Electrophysiologist: Dr. Ladona Ridgel   Referring Provider: Dr. Larinda Buttery  Patient Profile: Sean Brock is a 88 y.o. male with a history of SND s/p PPM, HTN, parox afib, atrial flutter s/p ablation, AVR, CVA, CAD, IDA req blood transfusions,  who is being seen today for the evaluation of palpitations and hypotension at the request of Dr. Larinda Buttery.  HPI:  [name] is a 88 y.o. male with PMH as above who routinely checks BP three times a day and noticed that his HR was in the 110s this morning. This was not typical for him. He was also feeling his heart beat hard in his chest. He was feeling some mild chest discomfort and decided to proceed to ER for further evaluation.  Initial vital signs in ER with hypotension down to 70s/60s with HR up to 120s.  INR therapeutic in ER, though has ben sub-therapeutic for many weeks prior. Labs overall unremarkable.  His PCP, PharmD have been making medication adjustments over the past few months for hypertension and hypotension. .   Patient denies chest pain, dizziness, syncope or presyncope currently.  Labs Potassium3.8 (07/24 1191) Magnesium  2.2 (07/24 0936) Creatinine, ser  0.83 (07/24 0936) PLT  184 (07/24 0936) HGB  12.8* (07/24 0936) WBC 5.7 (07/24 0936) Troponin I (High Sensitivity)10 (07/24 0936).    Past Medical History:  Diagnosis Date   AORTIC STENOSIS    a. Echo (05/26/13):  Mod LVH, vigorous LVF, EF 65-70%, no RWMA, Gr 1 DD, severe AS (mean 40 mmHg) => s/p bioprosthetic AVR 07/2013   Arthritis    BACK   Atrial fib/flutter, transient (HCC)    CAD (coronary artery disease)    a. Lexiscan myoview (5/13) with EF 70%, no ischemia or infarction;   b. LHC (06/2013):  Dist LM 30%, ostial LAD 40-50%, prox LAD 30%,  ostial D1 80-90%, prox CFX 40%, RCA occluded with L-R collats => CABG (S-PDA at time of AVR)   CVA (cerebral infarction)    a. 3/14 right parietal infarct. Carotid US 3/14 showed no significant disease. He has an implanted loop recorder to look for atrial fibrillation.    DYSPEPSIA    ED (erectile dysfunction)    Former smoker    GERD (gastroesophageal reflux disease)    History of kidney stones    HYPERLIPIDEMIA    HYPERTENSION    Palpitations    a. Event monitor (5/13) with occasional PVCs, PACs but no significant arrhythmia.    S/P aortic valve replacement with bioprosthetic valve + CABG x1 07/11/2013   23 mm [facility] Ease bovine pericardial tissue valve;  Echo (08/2013):  EF 55-60%, no RWMA, AVR ok, Asc Ao 40 mm (mildly dilated), mild LAE, mild RVE, lipomatous hypertrophy, mod TR, PASP 36 mmHg   S/P CABG x 1 07/11/2013   SVG to PDA with [facility] via right thigh   Sinus bradycardia    a. 2013 - HR 40's in the office. BB stopped.   Trigeminal neuropathy    Vertigo      Surgical History:  Past Surgical History:  Procedure Laterality Date   A-FLUTTER ABLATION N/A 11/13/2018   Procedure: A-FLUTTER ABLATION;  Surgeon: Marinus Maw, MD;  [facility];  Service: Cardiovascular;  Laterality: N/A;   ABDOMINAL AORTOGRAM W/LOWER  EXTREMITY N/A 02/03/2021   Procedure: ABDOMINAL AORTOGRAM W/LOWER EXTREMITY;  Surgeon: Cephus Shelling, MD;  Location[facility] INVASIVE CV LAB;  Service: Cardiovascular;  Laterality: N/A;   AORTIC VALVE REPLACEMENT N/A 07/11/2013   Procedure: AORTIC VALVE REPLACEMENT (AVR);  Surgeon: Purcell Nails, MD;  Location: [facility] OR;  Service: Open Heart Surgery;  Laterality: N/A;   CATARACT EXTRACTION     CORONARY ARTERY BYPASS GRAFT N/A 07/11/2013   Procedure: CORONARY ARTERY BYPASS GRAFTING (CABG) x1;  Surgeon: Purcell Nails, MD;  Location[facility];  Service: Open Heart Surgery;  Laterality: N/A;   INGUINAL HERNIA REPAIR Right 01/15/2013   Procedure: HERNIA  REPAIR INGUINAL ADULT;  Surgeon: Shelly Rubenstein, MD;  Location: [facility];  Service: General;  Laterality: Right;   INSERTION OF MESH Right 01/15/2013   Procedure: INSERTION OF MESH;  Surgeon: Shelly Rubenstein, MD;  Location: [facility];  Service: General;  Laterality: Right;   INTRAOPERATIVE TRANSESOPHAGEAL ECHOCARDIOGRAM N/A 07/11/2013   Procedure: INTRAOPERATIVE TRANSESOPHAGEAL ECHOCARDIOGRAM;  Surgeon: Purcell Nails, MD;  Location: [facility] OR;  Service: Open Heart Surgery;  Laterality: N/A;   LEFT AND RIGHT HEART CATHETERIZATION WITH CORONARY ANGIOGRAM N/A 06/06/2013   Procedure: LEFT AND RIGHT HEART CATHETERIZATION WITH CORONARY ANGIOGRAM;  Surgeon: Laurey Morale, MD;  Location: [facility] CATH LAB;  Service: Cardiovascular;  Laterality: N/A;   LITHOTRIPSY     LOOP RECORDER IMPLANT  04/15/2013   MDT LinQ implanred by Dr Ladona Ridgel for cryptogenic stroke   LOOP RECORDER IMPLANT N/A 04/14/2013   Procedure: LOOP RECORDER IMPLANT;  Surgeon: Marinus Maw, MD;  Location: [facility] CATH LAB;  Service: Cardiovascular;  Laterality: N/A;   LOOP RECORDER REMOVAL N/A 11/13/2018   Procedure: LOOP RECORDER REMOVAL;  Surgeon: Marinus Maw, MD;  [facility];  Service: Cardiovascular;  Laterality: N/A;   MOUTH SURGERY     PACEMAKER IMPLANT N/A 12/09/2018   Procedure: PACEMAKER IMPLANT;  Surgeon: Marinus Maw, MD;  [facility];  Service: Cardiovascular;  Laterality: N/A;   PERIPHERAL VASCULAR INTERVENTION Left 02/03/2021   Procedure: PERIPHERAL VASCULAR INTERVENTION;  Surgeon: Cephus Shelling, MD;  [facility];  Service: Cardiovascular;  Laterality: Left;  sfa     (Not in a hospital admission)   Inpatient Medications:   Allergies:  Allergies  Allergen Reactions   Sudafed [Pseudoephedrine Hcl] Palpitations   Tizanidine     Other Reaction(s): drowsy   Cardizem [Diltiazem Hcl] Rash   Sudafed [Pseudoephedrine] Palpitations     Family History  Problem Relation Age of Onset   Diabetes Mother    Heart attack Mother    Heart attack Father    Hypertension Father    Diabetes Unknown    Hypertension Unknown    Lung cancer Unknown    Cancer Sister        breast   Stroke Neg Hx      Physical Exam: Vitals:   09/27/22 0933 09/27/22 1001 09/27/22 1132 09/27/22 1248  BP:  97/72 (!) 120/90 (!) 140/104  Pulse:  (!) 120 (!) 115 (!) 111  Resp:  18 18 18   Temp:      TempSrc:      SpO2:  100% 100% 100%  Weight: 85.3 kg     Height: 6\' 3"  (1.905 m)       GEN- NAD, thin, A&O x 3, normal affect HEENT: Normocephalic, atraumatic Lungs- CTAB, Normal effort.  Heart- Regular, tachycardic  rate and rhythm, No M/G/R.  GI- Soft, NT, ND.  Extremities- No clubbing, cyanosis, or edema   Radiology/Studies: DG Chest Portable 1 View  Result Date: 09/27/2022 CLINICAL DATA:  Chest pain EXAM: PORTABLE CHEST 1 VIEW COMPARISON:  12/20/2018 x-ray FINDINGS: Sternal wires. Normal cardiopericardial silhouette. Calcified aorta. No consolidation, edema or pneumothorax. Left upper chest pacemaker. Tiny right effusion versus pleural thickening. IMPRESSION: Postop chest.  Tiny right effusion versus pleural thickening. Electronically Signed   By: Karen Kays M.D.   On: 09/27/2022 10:18    EKG: AS, V-pace at 113bpm, LBBB (personally reviewed)  TELEMETRY: tachycardic to 120 (personally reviewed)  DEVICE HISTORY: Bos Sci dual chamber PPM Interrogation reviewed, rhythm appears atrial vs sinus tach. Very regular, narrow QRS  Assessment/Plan: #) Atrial tach #) h/o parox afib Rhythm most likely appears atach given he is consistently in 110s with little variation BP can now support IV lopressor PRN Start 25 PO lopressor BID, can increase to three times per day if needed as long as BP can tolerate initially IV lopressor was ineffective, though rate did lower to 60bpm briefly Consider ATP if BB continues to be ineffective Do not favor DCCV  for atrial tach, pt ate breakfast this AM  #) Hypercoag d/t parox afib CHA2DS2-VASc Score = 6 [CHF History: 0, HTN History: 1, Diabetes History: 0, Stroke History: 2, Vascular Disease History: 1, Age Score: 2, Gender Score: 0].  Therefore, the patient's annual risk of stroke is 9.7 %. OAC - warfarin, managed at [facility] office - has been subtherapeutic for last several weeks Therapeutic on today's INR in ER Appreciate pharmacy's assistance with dosing     #) HTN Permissive HTN with tachycardia Will re-assess once HR normalizes      For questions or updates, please contact [facility] Please consult [email or web address] for contact info under Cardiology/STEMI.  Signed, Sherie Don, NP  09/27/2022 12:54 PM

## 2022-09-27 NOTE — Assessment & Plan Note (Signed)
Rosuvastatin 10 mg nightly, aspirin 81 mg daily, resumed on admission

## 2022-09-27 NOTE — Assessment & Plan Note (Signed)
Home metoprolol p.o. 25 mg twice daily, warfarin per pharmacy resumed on admission

## 2022-09-27 NOTE — Assessment & Plan Note (Signed)
Aspirin, rosuvastatin, warfarin

## 2022-09-27 NOTE — Assessment & Plan Note (Addendum)
Labetalol 5 mg IV every 4 hours as needed for SBP > 170, 3 doses ordered Patient has not tolerant of ARB's/hydralazine

## 2022-09-28 DIAGNOSIS — I350 Nonrheumatic aortic (valve) stenosis: Secondary | ICD-10-CM

## 2022-09-28 DIAGNOSIS — I10 Essential (primary) hypertension: Secondary | ICD-10-CM

## 2022-09-28 DIAGNOSIS — Z953 Presence of xenogenic heart valve: Secondary | ICD-10-CM | POA: Diagnosis not present

## 2022-09-28 DIAGNOSIS — Z951 Presence of aortocoronary bypass graft: Secondary | ICD-10-CM | POA: Diagnosis not present

## 2022-09-28 DIAGNOSIS — I5032 Chronic diastolic (congestive) heart failure: Secondary | ICD-10-CM | POA: Diagnosis not present

## 2022-09-28 DIAGNOSIS — I480 Paroxysmal atrial fibrillation: Secondary | ICD-10-CM | POA: Diagnosis not present

## 2022-09-28 DIAGNOSIS — E7800 Pure hypercholesterolemia, unspecified: Secondary | ICD-10-CM

## 2022-09-28 DIAGNOSIS — R002 Palpitations: Secondary | ICD-10-CM

## 2022-09-28 DIAGNOSIS — I4719 Other supraventricular tachycardia: Secondary | ICD-10-CM | POA: Diagnosis not present

## 2022-09-28 LAB — CBC
HCT: 37 % — ABNORMAL LOW (ref 39.0–52.0)
Hemoglobin: 12.2 g/dL — ABNORMAL LOW (ref 13.0–17.0)
MCH: 30.1 pg (ref 26.0–34.0)
MCHC: 33 g/dL (ref 30.0–36.0)
MCV: 91.4 fL (ref 80.0–100.0)
Platelets: 185 10*3/uL (ref 150–400)
RBC: 4.05 MIL/uL — ABNORMAL LOW (ref 4.22–5.81)
RDW: 14 % (ref 11.5–15.5)
WBC: 5.3 10*3/uL (ref 4.0–10.5)
nRBC: 0 % (ref 0.0–0.2)

## 2022-09-28 LAB — PROTIME-INR
INR: 3.1 — ABNORMAL HIGH (ref 0.8–1.2)
Prothrombin Time: 31.9 seconds — ABNORMAL HIGH (ref 11.4–15.2)

## 2022-09-28 LAB — BASIC METABOLIC PANEL
Anion gap: 6 (ref 5–15)
BUN: 16 mg/dL (ref 8–23)
CO2: 26 mmol/L (ref 22–32)
Calcium: 8.4 mg/dL — ABNORMAL LOW (ref 8.9–10.3)
Chloride: 107 mmol/L (ref 98–111)
GFR, Estimated: >60 mL/min (ref >60)
Glucose, Bld: 94 mg/dL (ref 70–99)
Sodium: 139 mmol/L (ref 135–145)

## 2022-09-28 MED ORDER — METOPROLOL TARTRATE 25 MG PO TABS: 25.0000 mg | ORAL_TABLET | Freq: Two times a day (BID) | ORAL | 1 refills | Status: DC

## 2022-09-28 NOTE — Care Management Obs Status (Signed)
MEDICARE OBSERVATION STATUS NOTIFICATION   Patient Details  Name: Sean Brock MRN: [id number] Date of Birth: 1934-08-20   Medicare Observation Status Notification Given:  Yes    Darolyn Rua, LCSW 09/28/2022, 1:40 PM

## 2022-09-28 NOTE — Consult Note (Signed)
ANTICOAGULATION CONSULT NOTE - Initial Consult  Pharmacy Consult for warfarin Indication: valvular atrial fibrillation - aortic valve replacement with bioprosthetic valve   Allergies  Allergen Reactions   Sudafed [Pseudoephedrine Hcl] Palpitations   Tizanidine     Other Reaction(s): drowsy   Cardizem [Diltiazem Hcl] Rash   Sudafed [Pseudoephedrine] Palpitations    Patient Measurements: Height: 6\' 3"  (190.5 cm) Weight: 85.3 kg (188 lb) IBW/kg (Calculated) : 84.5   Vital Signs: Temp: 97.9 F (36.6 C) (07/25 0315) Temp Source: Oral (07/24 2025) BP: 152/86 (07/25 0315) Pulse Rate: 59 (07/25 0315)  Labs: Recent Labs    09/27/22 0936 09/27/22 1006 09/27/22 1302 09/28/22 0533  HGB 12.8*  --   --  12.2*  HCT 39.4  --   --  37.0*  PLT 184  --   --  185  LABPROT  --  30.2*  --  31.9*  INR  --  2.9*  --  3.1*  CREATININE 0.83  --   --  0.75  TROPONINIHS 10  --  11  --     Estimated Creatinine Clearance: 79.2 mL/min (by C-G formula based on SCr of 0.75 mg/dL).   Medical History: Past Medical History:  Diagnosis Date   AORTIC STENOSIS    a. Echo (05/26/13):  Mod LVH, vigorous LVF, EF 65-70%, no RWMA, Gr 1 DD, severe AS (mean 40 mmHg) => s/p bioprosthetic AVR 07/2013   Arthritis    BACK   Atrial fib/flutter, transient (HCC)    CAD (coronary artery disease)    a. Lexiscan myoview (5/13) with EF 70%, no ischemia or infarction;   b. LHC (06/2013):  Dist LM 30%, ostial LAD 40-50%, prox LAD 30%, ostial D1 80-90%, prox CFX 40%, RCA occluded with L-R collats => CABG (S-PDA at time of AVR)   CVA (cerebral infarction)    a. 3/14 right parietal infarct. Carotid US 3/14 showed no significant disease. He has an implanted loop recorder to look for atrial fibrillation.    DYSPEPSIA    ED (erectile dysfunction)    Former smoker    GERD (gastroesophageal reflux disease)    History of kidney stones    HYPERLIPIDEMIA    HYPERTENSION    Palpitations    a. Event monitor (5/13) with  occasional PVCs, PACs but no significant arrhythmia.    S/P aortic valve replacement with bioprosthetic valve + CABG x1 07/11/2013   23 mm [facility] Ease bovine pericardial tissue valve;  Echo (08/2013):  EF 55-60%, no RWMA, AVR ok, Asc Ao 40 mm (mildly dilated), mild LAE, mild RVE, lipomatous hypertrophy, mod TR, PASP 36 mmHg   S/P CABG x 1 07/11/2013   SVG to PDA with [facility] via right thigh   Sinus bradycardia    a. 2013 - HR 40's in the office. BB stopped.   Trigeminal neuropathy    Vertigo     Medications:  PTA: warfarin regimen (5 mg tabs) - recently increased 09/20/22 at anti-coagulation visit  5 mg Wed/Friday 2.5 mg all other days TWD = 22.5 mg   Assessment: 88 y.o. male with PMH of hypertension, hyperlipidemia, CAD, stroke, and atrial fibrillation s/p aortic valve replacement with bioprosthetic valve  on Coumadin regimen outlined above presented to ED with palpitations. Pharmacy has been consulted to manage warfarin therapy during admission. INR 2.9 (therapeutic) on admission  DDI: reviewed 7/24: none  Date INR Warfarin Dose  7/23 -- 2.5 mg (PTA) 7/24 2.9 5 mg  7/25    3.1  2.5 mg  Goal of Therapy:  INR 2-3 Monitor platelets by anticoagulation protocol: Yes  INR this morning just above goal at 3.1. Per RN, no signs/symptoms of bleeding noted. CBC is stable.   Plan:  Give warfarin 2.5 mg x1 this afternoon Continue with daily INR/CBC for now  Rockwell Alexandria, PharmD Clinical Pharmacist   09/28/2022,7:46 AM

## 2022-09-28 NOTE — Discharge Summary (Signed)
Physician Discharge Summary   Patient: Sean Brock MRN: [id number] DOB: 06-15-1934  Admit date:     09/27/2022  Discharge date: 09/28/22  Discharge Physician: Marcelino Duster   PCP: Blair Heys, MD   Recommendations at discharge:    PCP follow up in 1 week. Cardiology follow up as scheduled.  Discharge Diagnoses: Principal Problem:   Atrial tachycardia Active Problems:   Pure hypercholesterolemia   Essential hypertension   Severe aortic stenosis   CAD (coronary artery disease)   Cerebral infarction (HCC)   GERD (gastroesophageal reflux disease)   S/P aortic valve replacement with bioprosthetic valve + CABG x1   S/P CABG x 1   Atrial fibrillation [I48.91]   Heart failure with preserved ejection fraction (HCC)   Palpitations  Resolved Problems:   * No resolved hospital problems. *  Hospital Course: Sean Brock is a 88 year old male with history of CAD status post CABG, hypertension, hyperlipidemia, atrial fibrillation on warfarin, status postcardiac pacemaker implantation, who presents to the emergency department who presents to the emergency department for chief concerns of intermittent chest pain with tachycardia.   Vitals in the ED showed temperature of 98.3, respiration rate of 20, heart rate of 122, blood pressure 76/65, SpO2 97% on room air.   Serum sodium is 138, potassium 3.8, chloride 107, bicarb 24, BUN of 19, serum creatinine of 0.83, eGFR > 60, nonfasting blood glucose 154, WBC 5.3, hemoglobin 12.8, platelets of 184.   High sensitive troponin was 10 and on repeat was 11.   ED treatment: Metoprolol 2.5 mg IV one-time dose at 11:35 AM, metoprolol 500 mg IV 2 times dose at 1239 and 12:45 PM, metoprolol 10 mg IV one-time dose at 1256.  Metoprolol 25 mg p.o., sodium chloride 500 mL bolus. ---------------------------- At bedside, he is able to tell me his name, age, current location, current year.   He sees Dr. Ladona Ridgel and was told to stop his coreg two  weeks ago because it was dropping his blood pressure too low.   This morning while he was urinating, he noticed that he developed an increased heart rate and this did not resolve.  He denies chest pain, shortness of breath, abdominal pain, dysuria, hematuria, diarrhea, swelling of his lower extremities, syncope, loss of consciousness.  He denies trauma to his person.  During hospital stay- patient is seen by EP team who advised metoprolol 25mg  bid therapy. Hid heart rate much improved, wishes to go home. Patient remains asymptomatic. He understands to follow up with PCP, Cardiology as instructed. Wife and patient understand and agree with discharge plan.       Consultants: Cardiology  Procedures performed: none  Disposition: Home Diet recommendation:  Discharge Diet Orders (From admission, onward)     Start     Ordered   09/28/22 0000  Diet - low sodium heart healthy        09/28/22 1321           Cardiac diet DISCHARGE MEDICATION: Allergies as of 09/28/2022       Reactions   Sudafed [pseudoephedrine Hcl] Palpitations   Tizanidine    Other Reaction(s): drowsy   Cardizem [diltiazem Hcl] Rash   Sudafed [pseudoephedrine] Palpitations        Medication List     STOP taking these medications    carvedilol 3.125 MG tablet Commonly known as: COREG   hydrALAZINE 10 MG tablet Commonly known as: APRESOLINE       TAKE these medications    acetaminophen 500  MG tablet Commonly known as: TYLENOL Take 500 mg by mouth every 6 (six) hours as needed for mild pain.   aspirin EC 81 MG tablet Take 1 tablet (81 mg total) by mouth daily.   CLEAR EYES FOR DRY EYES OP Place 1 drop into both eyes daily.   famotidine 20 MG tablet Commonly known as: PEPCID Take 20 mg by mouth 2 (two) times daily.   ferrous sulfate 325 (65 FE) MG tablet Take 325 mg by mouth daily with breakfast.   MEDERMA AG FACE EX Apply 1 application topically daily as needed (on face for skin cancer).    metoprolol tartrate 25 MG tablet Commonly known as: LOPRESSOR Take 1 tablet (25 mg total) by mouth 2 (two) times daily.   polyethylene glycol 17 g packet Commonly known as: MIRALAX / GLYCOLAX Take 17 g by mouth daily as needed (constipation.).   rosuvastatin 10 MG tablet Commonly known as: CRESTOR Take 10 mg by mouth every evening.   tamsulosin 0.4 MG Caps capsule Commonly known as: FLOMAX Take 0.4 mg by mouth every evening.   valsartan 80 MG tablet Commonly known as: DIOVAN Take 80 mg by mouth daily.   Vitamin B Complex Tabs daily at 6 (six) AM.   warfarin 5 MG tablet Commonly known as: COUMADIN Take as directed. If you are unsure how to take this medication, talk to your nurse or doctor. Original instructions: TAKE 1/2 (ONE HALF) TO 1 TABLET BY MOUTH ONCE DAILY        Discharge Exam: Filed Weights   09/27/22 0933  Weight: 85.3 kg   General - Elderly Caucasian male, no apparent distress HEENT - PERRLA, EOMI, atraumatic head, non tender sinuses. Lung - Clear, rales, rhonchi, wheezes. Heart - S1, S2 heard, no murmurs, rubs, trace pedal edema Neuro - Alert, awake and oriented x 3, non focal exam. Skin - Warm and dry.  Condition at discharge: stable  The results of significant diagnostics from this hospitalization (including imaging, microbiology, ancillary and laboratory) are listed below for reference.   Imaging Studies: CUP PACEART REMOTE DEVICE CHECK  Result Date: 09/27/2022 Scheduled remote reviewed. Normal device function.  8 AHR detections totaling <1%, longest lasting 14 seconds, EGMs consistent with paroxysmal atrial flutter.  Known history of AF, on warfarin. Next remote 91 days. - CS, CVRS  DG Chest Portable 1 View  Result Date: 09/27/2022 CLINICAL DATA:  Chest pain EXAM: PORTABLE CHEST 1 VIEW COMPARISON:  12/20/2018 x-ray FINDINGS: Sternal wires. Normal cardiopericardial silhouette. Calcified aorta. No consolidation, edema or pneumothorax. Left upper  chest pacemaker. Tiny right effusion versus pleural thickening. IMPRESSION: Postop chest.  Tiny right effusion versus pleural thickening. Electronically Signed   By: Karen Kays M.D.   On: 09/27/2022 10:18    Microbiology: Results for orders placed or performed during the hospital encounter of 04/01/22  Resp panel by RT-PCR (RSV, Flu A&B, Covid) Anterior Nasal Swab     Status: None   Collection Time: 04/01/22  2:36 PM   Specimen: Anterior Nasal Swab  Result Value Ref Range Status   SARS Coronavirus 2 by RT PCR NEGATIVE NEGATIVE Final    Comment: (NOTE) SARS-CoV-2 target nucleic acids are NOT DETECTED.  The SARS-CoV-2 RNA is generally detectable in upper respiratory specimens during the acute phase of infection. The lowest concentration of SARS-CoV-2 viral copies this assay can detect is 138 copies/mL. A negative result does not preclude SARS-Cov-2 infection and should not be used as the sole basis for treatment or other  patient management decisions. A negative result may occur with  improper specimen collection/handling, submission of specimen other than nasopharyngeal swab, presence of viral mutation(s) within the areas targeted by this assay, and inadequate number of viral copies(<138 copies/mL). A negative result must be combined with clinical observations, patient history, and epidemiological information. The expected result is Negative.  Fact Sheet for Patients:  BloggerCourse.com  Fact Sheet for Healthcare Providers:  SeriousBroker.it  This test is no t yet approved or cleared by the [place] FDA and  has been authorized for detection and/or diagnosis of SARS-CoV-2 by FDA under an Emergency Use Authorization (EUA). This EUA will remain  in effect (meaning this test can be used) for the duration of the COVID-19 declaration under Section 564(b)(1) of the Act, 21 U.S.C.section 360bbb-3(b)(1), unless the authorization is  terminated  or revoked sooner.       Influenza A by PCR NEGATIVE NEGATIVE Final   Influenza B by PCR NEGATIVE NEGATIVE Final    Comment: (NOTE) The Xpert Xpress SARS-CoV-2/FLU/RSV plus assay is intended as an aid in the diagnosis of influenza from Nasopharyngeal swab specimens and should not be used as a sole basis for treatment. Nasal washings and aspirates are unacceptable for Xpert Xpress SARS-CoV-2/FLU/RSV testing.  Fact Sheet for Patients: BloggerCourse.com  Fact Sheet for Healthcare Providers: SeriousBroker.it  This test is not yet approved or cleared by the [place] FDA and has been authorized for detection and/or diagnosis of SARS-CoV-2 by FDA under an Emergency Use Authorization (EUA). This EUA will remain in effect (meaning this test can be used) for the duration of the COVID-19 declaration under Section 564(b)(1) of the Act, 21 U.S.C. section 360bbb-3(b)(1), unless the authorization is terminated or revoked.     Resp Syncytial Virus by PCR NEGATIVE NEGATIVE Final    Comment: (NOTE) Fact Sheet for Patients: BloggerCourse.com  Fact Sheet for Healthcare Providers: SeriousBroker.it  This test is not yet approved or cleared by the [place] FDA and has been authorized for detection and/or diagnosis of SARS-CoV-2 by FDA under an Emergency Use Authorization (EUA). This EUA will remain in effect (meaning this test can be used) for the duration of the COVID-19 declaration under Section 564(b)(1) of the Act, 21 U.S.C. section 360bbb-3(b)(1), unless the authorization is terminated or revoked.  Performed at [facility], [address] [place], [place] [address]     Labs: CBC: Recent Labs  Lab 09/27/22 0936 09/28/22 0533  WBC 5.7 5.3  HGB 12.8* 12.2*  HCT 39.4 37.0*  MCV 92.5 91.4  PLT 184 185   Basic Metabolic Panel: Recent Labs   Lab 09/27/22 0936 09/28/22 0533  NA 138 139  K 3.8 3.9  CL 107 107  CO2 24 26  GLUCOSE 154* 94  BUN 19 16  CREATININE 0.83 0.75  CALCIUM 8.7* 8.4*  MG 2.2  --    Liver Function Tests: No results for input(s): "AST", "ALT", "ALKPHOS", "BILITOT", "PROT", "ALBUMIN" in the last 168 hours. CBG: No results for input(s): "GLUCAP" in the last 168 hours.  Discharge time spent: greater than 30 minutes.  Signed: Marcelino Duster, MD [facility] 09/28/2022

## 2022-09-28 NOTE — Progress Notes (Signed)
Patient Name: Sean Brock Date of Encounter: 09/28/2022  Primary Cardiologist: None Electrophysiologist: Lewayne Bunting, MD  Interval Summary   NAEON.  Tx to floor bed. Visiting with family this morning.  No acute complaints. At this time, the patient denies chest pain, chest pressure, shortness of breath.   Inpatient Medications    Scheduled Meds:  aspirin EC  81 mg Oral Daily   famotidine  20 mg Oral BID   ferrous sulfate  325 mg Oral Q breakfast   metoprolol tartrate  25 mg Oral BID   rosuvastatin  10 mg Oral QHS   tamsulosin  0.4 mg Oral QPM   warfarin  2.5 mg Oral Once per day on Sunday Monday Tuesday Thursday Saturday   warfarin  5 mg Oral Once per day on Wednesday Friday   Warfarin - Pharmacist Dosing Inpatient   Does not apply q1600   Continuous Infusions:  PRN Meds: acetaminophen **OR** acetaminophen, labetalol, ondansetron **OR** ondansetron (ZOFRAN) IV, polyethylene glycol, senna-docusate   Vital Signs    Vitals:   09/27/22 1814 09/27/22 2025 09/28/22 0007 09/28/22 0315  BP: (!) 165/76 (!) 160/84 (!) 163/88 (!) 152/86  Pulse: (!) 59 (!) 59 (!) 59 (!) 59  Resp: 18 20 18 18   Temp: 97.6 F (36.4 C) 97.8 F (36.6 C) (!) 97.5 F (36.4 C) 97.9 F (36.6 C)  TempSrc:  Oral    SpO2: 99% 98% 99% 98%  Weight:      Height:        Intake/Output Summary (Last 24 hours) at 09/28/2022 0901 Last data filed at 09/27/2022 1549 Gross per 24 hour  Intake 500 ml  Output --  Net 500 ml   Filed Weights   09/27/22 0933  Weight: 85.3 kg    Physical Exam    GEN- The patient is well appearing, alert and oriented x 3 today.   Lungs- Clear to ausculation bilaterally, normal work of breathing Cardiac- Regular rate and rhythm, no murmurs, rubs or gallops GI- soft, NT, ND, + BS Extremities- no clubbing or cyanosis. No edema  Telemetry    AV paced, rate 60s (personally reviewed)  Hospital Course    Sean Brock is a 88 y.o. male with a history of SND s/p PPM,  HTN, parox afib, atrial flutter s/p ablation, AVR, CVA, CAD, IDA req blood transfusions admitted for atrial tach.   Assessment & Plan    #) Atrial tach #) h/o parox afib, atrial flutter Rhythm most likely appears atach given he is consistently in 110s, very regular with little variation Heart rate initially unresponsive to IV lopressor 5mg , then 5mg , then 10mg .  Started 25mg  PO lopressor BID, rates have maintained in 60s overnight - continue 25mg  lopressor BID  #) Hypercoag d/t parox afib CHA2DS2-VASc Score = 6 [CHF History: 0, HTN History: 1, Diabetes History: 0, Stroke History: 2, Vascular Disease History: 1, Age Score: 2, Gender Score: 0].  Therefore, the patient's annual risk of stroke is 9.7 %. OAC - warfarin, managed at [facility] office - has been subtherapeutic for last several weeks Now therapeutic Appreciate pharmacy's assistance with dosing      #) HTN #) orthostatic hypotension Continues to be hypertensive with normalized HR - orthostatics this morning positive   EP will sign off at this time, bur remains available OK to discharge from EP perspective   [facility] will sign off.   Medication Recommendations:  25mg  lopressor BID Other recommendations (labs, testing, etc):  none Follow up  as an outpatient:  8/7 at 9:30 with NP Sherie Don   For questions or updates, please contact [facility] Please consult [email or web address] for contact info under Cardiology/STEMI.  Signed, Sherie Don, NP  09/28/2022, 9:01 AM

## 2022-09-28 NOTE — Plan of Care (Signed)

## 2022-09-28 NOTE — Care Management CC44 (Signed)
Condition Code 44 Documentation Completed  Patient Details  Name: Sean Brock MRN: [id number] Date of Birth: [birth year]   Condition Code 44 given:  Yes Patient signature on Condition Code 44 notice:  Yes Documentation of 2 MD's agreement:  Yes Code 44 added to claim:  Yes    Darolyn Rua, LCSW 09/28/2022, 1:40 PM

## 2022-10-09 NOTE — Progress Notes (Unsigned)
Cardiology Office Note Date:  10/09/2022  Patient ID:  Sean, Brock January 14, 1935, MRN [id number] PCP:  Blair Heys, MD  Cardiologist:  None Electrophysiologist: Lewayne Bunting, MD  ***refresh   Chief Complaint: ***  History of Present Illness: Sean Brock is a 88 y.o. male with PMH notable for SND s/p PPM, parox AFib, flutter s/p ablation, s/p AVR, CVA, CAD, HTN, hypotension; seen today for Lewayne Bunting, MD for post hospital follow up.    Admitted 7/24-25, presented to [facility] ER with tachycardia, found to be in atrial tach up to 120s. Initially hypotensive, though BP improved. Arrhythmia broke with IV metop, followed by PO. Discharged on 25mg  lopressor BID.   On follow-up today, *** *** AF burden, symptoms *** palpitations *** bleeding concerns   Since discharge from hospital the patient reports doing ***.  he denies chest pain, palpitations, dyspnea, PND, orthopnea, nausea, vomiting, dizziness, syncope, edema, weight gain, or early satiety.     Device Information: Bos Sci dual chamber PPM, imp 12/2018; dx SND  AAD History: None   Past Medical History:  Diagnosis Date   AORTIC STENOSIS    a. Echo (05/26/13):  Mod LVH, vigorous LVF, EF 65-70%, no RWMA, Gr 1 DD, severe AS (mean 40 mmHg) => s/p bioprosthetic AVR 07/2013   Arthritis    BACK   Atrial fib/flutter, transient (HCC)    CAD (coronary artery disease)    a. Lexiscan myoview (5/13) with EF 70%, no ischemia or infarction;   b. LHC (06/2013):  Dist LM 30%, ostial LAD 40-50%, prox LAD 30%, ostial D1 80-90%, prox CFX 40%, RCA occluded with L-R collats => CABG (S-PDA at time of AVR)   CVA (cerebral infarction)    a. 3/14 right parietal infarct. Carotid US 3/14 showed no significant disease. He has an implanted loop recorder to look for atrial fibrillation.    DYSPEPSIA    ED (erectile dysfunction)    Former smoker    GERD (gastroesophageal reflux disease)    History of kidney stones    HYPERLIPIDEMIA     HYPERTENSION    Palpitations    a. Event monitor (5/13) with occasional PVCs, PACs but no significant arrhythmia.    S/P aortic valve replacement with bioprosthetic valve + CABG x1 07/11/2013   23 mm [facility] Ease bovine pericardial tissue valve;  Echo (08/2013):  EF 55-60%, no RWMA, AVR ok, Asc Ao 40 mm (mildly dilated), mild LAE, mild RVE, lipomatous hypertrophy, mod TR, PASP 36 mmHg   S/P CABG x 1 07/11/2013   SVG to PDA with [facility] via right thigh   Sinus bradycardia    a. 2013 - HR 40's in the office. BB stopped.   Trigeminal neuropathy    Vertigo     Past Surgical History:  Procedure Laterality Date   A-FLUTTER ABLATION N/A 11/13/2018   Procedure: A-FLUTTER ABLATION;  Surgeon: Marinus Maw, MD;  Location: [facility];  Service: Cardiovascular;  Laterality: N/A;   ABDOMINAL AORTOGRAM W/LOWER EXTREMITY N/A 02/03/2021   Procedure: ABDOMINAL AORTOGRAM W/LOWER EXTREMITY;  Surgeon: Cephus Shelling, MD;  Location: [facility] CV [facility];  Service: Cardiovascular;  Laterality: N/A;   AORTIC VALVE REPLACEMENT N/A 07/11/2013   Procedure: AORTIC VALVE REPLACEMENT (AVR);  Surgeon: Purcell Nails, MD;  Location: [facility] OR;  Service: Open Heart Surgery;  Laterality: N/A;   CATARACT EXTRACTION     CORONARY ARTERY BYPASS GRAFT N/A 07/11/2013   Procedure: CORONARY ARTERY BYPASS GRAFTING (CABG) x1;  Surgeon: Purcell Nails, MD;  Location[facility] OR;  Service: Open Heart Surgery;  Laterality: N/A;   INGUINAL HERNIA REPAIR Right 01/15/2013   Procedure: HERNIA REPAIR INGUINAL ADULT;  Surgeon: Shelly Rubenstein, MD;  Location: [facility];  Service: General;  Laterality: Right;   INSERTION OF MESH Right 01/15/2013   Procedure: INSERTION OF MESH;  Surgeon: Shelly Rubenstein, MD;  Location: [facility];  Service: General;  Laterality: Right;   INTRAOPERATIVE TRANSESOPHAGEAL ECHOCARDIOGRAM N/A 07/11/2013   Procedure: INTRAOPERATIVE TRANSESOPHAGEAL ECHOCARDIOGRAM;  Surgeon:  Purcell Nails, MD;  Location: [facility] OR;  Service: Open Heart Surgery;  Laterality: N/A;   LEFT AND RIGHT HEART CATHETERIZATION WITH CORONARY ANGIOGRAM N/A 06/06/2013   Procedure: LEFT AND RIGHT HEART CATHETERIZATION WITH CORONARY ANGIOGRAM;  Surgeon: Laurey Morale, MD;  Location: [facility] CATH LAB;  Service: Cardiovascular;  Laterality: N/A;   LITHOTRIPSY     LOOP RECORDER IMPLANT  04/15/2013   MDT LinQ implanred by Dr Ladona Ridgel for cryptogenic stroke   LOOP RECORDER IMPLANT N/A 04/14/2013   Procedure: LOOP RECORDER IMPLANT;  Surgeon: Marinus Maw, MD;  Location: [facility] CATH LAB;  Service: Cardiovascular;  Laterality: N/A;   LOOP RECORDER REMOVAL N/A 11/13/2018   Procedure: LOOP RECORDER REMOVAL;  Surgeon: Marinus Maw, MD;  Location: [facility];  Service: Cardiovascular;  Laterality: N/A;   MOUTH SURGERY     PACEMAKER IMPLANT N/A 12/09/2018   Procedure: PACEMAKER IMPLANT;  Surgeon: Marinus Maw, MD;  Location: [facility];  Service: Cardiovascular;  Laterality: N/A;   PERIPHERAL VASCULAR INTERVENTION Left 02/03/2021   Procedure: PERIPHERAL VASCULAR INTERVENTION;  Surgeon: Cephus Shelling, MD;  Location: [facility];  Service: Cardiovascular;  Laterality: Left;  sfa    Current Outpatient Medications  Medication Instructions   acetaminophen (TYLENOL) 500 mg, Oral, Every 6 hours PRN   aspirin EC 81 mg, Oral, Daily   B Complex Vitamins (VITAMIN B COMPLEX) TABS Daily   Carboxymethylcellul-Glycerin (CLEAR EYES FOR DRY EYES OP) 1 drop, Both Eyes, Daily   Emollient (MEDERMA AG FACE EX) 1 application , Topical, Daily PRN   famotidine (PEPCID) 20 mg, Oral, 2 times daily   ferrous sulfate 325 mg, Oral, Daily with breakfast   metoprolol tartrate (LOPRESSOR) 25 mg, Oral, 2 times daily   polyethylene glycol (MIRALAX / GLYCOLAX) 17 g, Oral, Daily PRN   rosuvastatin (CRESTOR) 10 mg, Oral, Every evening   tamsulosin (FLOMAX) 0.4 mg, Oral, Every evening   valsartan (DIOVAN) 80 mg, Daily    warfarin (COUMADIN) 5 MG tablet TAKE 1/2 (ONE HALF) TO 1 TABLET BY MOUTH ONCE DAILY    Social History:  The patient  reports that he quit smoking about 13 years ago. His smoking use included cigarettes. He started smoking about 43 years ago. He has a 30 pack-year smoking history. He has never used smokeless tobacco. He reports that he does not drink alcohol and does not use drugs.   Family History:  *** include only if pertinent The patient's family history includes Cancer in his sister; Diabetes in his mother and unknown relative; Heart attack in his father and mother; Hypertension in his father and unknown relative; Lung cancer in his unknown relative.***  ROS:  Please see the history of present illness. All other systems are reviewed and otherwise negative.   PHYSICAL EXAM: *** VS:  There were no vitals taken for this visit. BMI: There is no height or weight on file  to calculate BMI.  GEN- The patient is well appearing, alert and oriented x 3 today.   Lungs- Clear to ausculation bilaterally, normal work of breathing.  Heart- {Blank single:19197::"Regular","Irregularly irregular"} rate and rhythm, no murmurs, rubs or gallops Extremities- {EDEMA LEVEL:28147::"No"} peripheral edema, warm, dry Skin-  *** device pocket well-healed, no tethering   Device interrogation done today and reviewed by myself:  Battery *** Lead thresholds, impedence, sensing stable *** *** episodes *** changes made today  EKG is ordered. Personal review of EKG from today shows:  ***        Recent Labs: 04/01/2022: ALT 13 09/27/2022: Magnesium 2.2; TSH 1.812 09/28/2022: BUN 16; Creatinine, Ser 0.75; Hemoglobin 12.2; Platelets 185; Potassium 3.9; Sodium 139  No results found for requested labs within last 365 days.   Estimated Creatinine Clearance: 79.2 mL/min (by C-G formula based on SCr of 0.75 mg/dL).   Wt Readings from Last 3 Encounters:  09/27/22 188 lb (85.3 kg)  07/04/22 197 lb (89.4 kg)  06/14/22 199  lb 3.2 oz (90.4 kg)     Additional studies reviewed include: Previous EP, cardiology notes.   TTE, 04/28/2022  1. Left ventricular ejection fraction, by estimation, is 55 to 60%. The left ventricle has normal function. The left ventricle has no regional wall motion abnormalities. There is severe concentric left ventricular hypertrophy. Left ventricular diastolic  parameters are consistent with Grade II diastolic dysfunction (pseudonormalization).   2. Right ventricular systolic function is normal. The right ventricular size is Dilated. There is normal pulmonary artery systolic pressure.   3. Left atrial size was moderately dilated.   4. Right atrial size was severely dilated.   5. A small pericardial effusion is present. The pericardial effusion is posterior to the left ventricle.   6. The mitral valve is grossly normal. Mild mitral valve regurgitation. No evidence of mitral stenosis.   7. Variable color Doppler flow of TR- appears severe in image 22 and less in other views. Tricuspid valve regurgitation estimated at moderate due to variance from images.   8. The aortic valve was not well visualized-23 mm [employer]. Aortic valve regurgitation is not visualized. Echo findings are consistent with normal structure and function of the aortic valve prosthesis. Aortic valve mean gradient measures 12.0 mmHg.   9. Aortic dilatation noted. There is mild dilatation of the ascending aorta, measuring 40 mm.  10. The inferior vena cava is normal in size with greater than 50% respiratory variability, suggesting right atrial pressure of 3 mmHg.    ASSESSMENT AND PLAN:  #) parox Aflutter, AFib #) atach Recently admitted with atrial tach Converted to NSR and discharged on 25mg  lopressor BID   #) Hypercoag d/t parox afib CHA2DS2-VASc Score = 6 [CHF History: 0, HTN History: 1, Diabetes History: 0, Stroke History: 2, Vascular Disease History: 1, Age Score: 2, Gender Score: 0].  Therefore, the  patient's annual risk of stroke is 9.7 %.    Stroke ppx - warfarin, managed at [facility] office  No bleeding concerns  {Confirm score is correct.  If not, click here to update score.  REFRESH note.  :1}   #) HTN #) orthostatic    #) SND s/p Bos Sci PPM   {Are you ordering a CV Procedure (e.g. stress test, cath, DCCV, TEE, etc)?   Press F2        :[id number]}   Current medicines are reviewed at length with the patient today.   The patient {ACTIONS; HAS/DOES NOT HAVE:19233} concerns regarding  his medicines.  The following changes were made today:  {NONE DEFAULTED:18576}  Labs/ tests ordered today include: *** No orders of the defined types were placed in this encounter.    Disposition: Follow up with {EPMDS:28135} or EP APP {EPFOLLOW UP:28173}   Signed, Sherie Don, NP  10/09/22  8:29 AM  Electrophysiology [facility]

## 2022-10-10 DIAGNOSIS — D649 Anemia, unspecified: Secondary | ICD-10-CM | POA: Diagnosis not present

## 2022-10-11 ENCOUNTER — Ambulatory Visit: Payer: Medicare Other | Attending: Internal Medicine

## 2022-10-11 ENCOUNTER — Ambulatory Visit (INDEPENDENT_AMBULATORY_CARE_PROVIDER_SITE_OTHER): Payer: Medicare Other | Admitting: Cardiology

## 2022-10-11 ENCOUNTER — Encounter: Payer: Self-pay | Admitting: Cardiology

## 2022-10-11 VITALS — BP 164/82 | HR 60 | Ht 75.0 in | Wt 192.2 lb

## 2022-10-11 DIAGNOSIS — I10 Essential (primary) hypertension: Secondary | ICD-10-CM | POA: Diagnosis not present

## 2022-10-11 DIAGNOSIS — D6859 Other primary thrombophilia: Secondary | ICD-10-CM | POA: Diagnosis not present

## 2022-10-11 DIAGNOSIS — I4891 Unspecified atrial fibrillation: Secondary | ICD-10-CM | POA: Diagnosis not present

## 2022-10-11 DIAGNOSIS — I4892 Unspecified atrial flutter: Secondary | ICD-10-CM

## 2022-10-11 DIAGNOSIS — I951 Orthostatic hypotension: Secondary | ICD-10-CM

## 2022-10-11 DIAGNOSIS — Z950 Presence of cardiac pacemaker: Secondary | ICD-10-CM

## 2022-10-11 DIAGNOSIS — Z5181 Encounter for therapeutic drug level monitoring: Secondary | ICD-10-CM

## 2022-10-11 DIAGNOSIS — Z953 Presence of xenogenic heart valve: Secondary | ICD-10-CM | POA: Diagnosis not present

## 2022-10-11 DIAGNOSIS — D6869 Other thrombophilia: Secondary | ICD-10-CM

## 2022-10-11 DIAGNOSIS — I480 Paroxysmal atrial fibrillation: Secondary | ICD-10-CM | POA: Diagnosis not present

## 2022-10-11 DIAGNOSIS — I495 Sick sinus syndrome: Secondary | ICD-10-CM | POA: Diagnosis not present

## 2022-10-11 DIAGNOSIS — I824Y9 Acute embolism and thrombosis of unspecified deep veins of unspecified proximal lower extremity: Secondary | ICD-10-CM | POA: Diagnosis not present

## 2022-10-11 LAB — CUP PACEART INCLINIC DEVICE CHECK
Date Time Interrogation Session: 20240807154803
Implantable Lead Connection Status: 753985
Implantable Lead Connection Status: 753985
Implantable Lead Implant Date: 20201005
Implantable Lead Implant Date: 20201005
Implantable Lead Location: 753859
Implantable Lead Location: 753860
Implantable Lead Model: 7742
Implantable Lead Model: 7841
Implantable Lead Serial Number: 1017357
Implantable Lead Serial Number: 1068132
Implantable Pulse Generator Implant Date: 20201005
Lead Channel Impedance Value: 552 Ohm
Lead Channel Impedance Value: 679 Ohm
Lead Channel Pacing Threshold Amplitude: 0.2 V
Lead Channel Pacing Threshold Amplitude: 0.9 V
Lead Channel Pacing Threshold Pulse Width: 0.4 ms
Lead Channel Pacing Threshold Pulse Width: 0.4 ms
Lead Channel Sensing Intrinsic Amplitude: 4.2 mV
Lead Channel Setting Pacing Amplitude: 2.3 V
Lead Channel Setting Pacing Amplitude: 2.6 V
Lead Channel Setting Pacing Pulse Width: 0.4 ms
Lead Channel Setting Sensing Sensitivity: 3 mV
Pulse Gen Serial Number: 890709
Zone Setting Status: 755011

## 2022-10-11 LAB — POCT INR: INR: 3 (ref 2.0–3.0)

## 2022-10-11 NOTE — Patient Instructions (Signed)
Medication Instructions:  The current medical regimen is effective;  continue present plan and medications.  *If you need a refill on your cardiac medications before your next appointment, please call your pharmacy*   Follow-Up: At [facility], you and your health needs are our priority.  As part of our continuing mission to provide you with exceptional heart care, we have created designated Provider Care Teams.  These Care Teams include your primary Cardiologist (physician) and Advanced Practice Providers (APPs -  Physician Assistants and Nurse Practitioners) who all work together to provide you with the care you need, when you need it.  We recommend signing up for the patient portal called "MyChart".  Sign up information is provided on this After Visit Summary.  MyChart is used to connect with patients for Virtual Visits (Telemedicine).  Patients are able to view lab/test results, encounter notes, upcoming appointments, etc.  Non-urgent messages can be sent to your provider as well.   To learn more about what you can do with MyChart, go to [email or web address].    Your next appointment:   3 month(s)  Provider:   Sherie Don, NP

## 2022-10-11 NOTE — Patient Instructions (Signed)
Continue 1/2 tablet daily, EXCEPT 1 TABLET EVERY WEDNESDAY AND FRIDAY.  Recheck INR in 6 weeks.  EAT 2 HELPINGS OF GREENS PER WEEK. Coumadin Clinic [phone].

## 2022-10-11 NOTE — Progress Notes (Signed)
Remote pacemaker transmission.   

## 2022-10-13 DIAGNOSIS — R350 Frequency of micturition: Secondary | ICD-10-CM | POA: Diagnosis not present

## 2022-10-13 DIAGNOSIS — Z7901 Long term (current) use of anticoagulants: Secondary | ICD-10-CM | POA: Diagnosis not present

## 2022-10-13 DIAGNOSIS — I4891 Unspecified atrial fibrillation: Secondary | ICD-10-CM | POA: Diagnosis not present

## 2022-10-16 ENCOUNTER — Telehealth: Payer: Self-pay

## 2022-10-16 ENCOUNTER — Telehealth: Payer: Self-pay | Admitting: Internal Medicine

## 2022-10-16 NOTE — Telephone Encounter (Signed)
I spoke with the patient who will be taking Bactrim for 30 days.  I advised him to take 0.5 tablet Daily of Warfarin and eat 3 helpings of greens per week until I see him 9/18 in [place].  I also told him to monitor for bleeding and bruising that if they occurred, call us for further advisement.

## 2022-10-16 NOTE — Telephone Encounter (Signed)
Pt called in asking to speak with the coumadin department. He states he had a medication added to his regimen (Bactrim) that will affect his coumadin checks. Please advise.

## 2022-11-20 ENCOUNTER — Other Ambulatory Visit: Payer: Self-pay | Admitting: Internal Medicine

## 2022-11-20 DIAGNOSIS — I4891 Unspecified atrial fibrillation: Secondary | ICD-10-CM

## 2022-11-21 DIAGNOSIS — L820 Inflamed seborrheic keratosis: Secondary | ICD-10-CM | POA: Diagnosis not present

## 2022-11-21 DIAGNOSIS — L570 Actinic keratosis: Secondary | ICD-10-CM | POA: Diagnosis not present

## 2022-11-21 DIAGNOSIS — X32XXXD Exposure to sunlight, subsequent encounter: Secondary | ICD-10-CM | POA: Diagnosis not present

## 2022-11-22 ENCOUNTER — Ambulatory Visit: Payer: Medicare Other | Attending: Internal Medicine

## 2022-11-22 DIAGNOSIS — D6859 Other primary thrombophilia: Secondary | ICD-10-CM

## 2022-11-22 DIAGNOSIS — Z953 Presence of xenogenic heart valve: Secondary | ICD-10-CM

## 2022-11-22 DIAGNOSIS — Z5181 Encounter for therapeutic drug level monitoring: Secondary | ICD-10-CM

## 2022-11-22 DIAGNOSIS — I4891 Unspecified atrial fibrillation: Secondary | ICD-10-CM

## 2022-11-22 DIAGNOSIS — I824Y9 Acute embolism and thrombosis of unspecified deep veins of unspecified proximal lower extremity: Secondary | ICD-10-CM

## 2022-11-22 LAB — POCT INR: INR: 2 (ref 2.0–3.0)

## 2022-11-22 NOTE — Patient Instructions (Signed)
Continue 1/2 tablet daily, EXCEPT 1 TABLET EVERY WEDNESDAY AND FRIDAY.  Recheck INR in 6 weeks.  EAT 2 HELPINGS OF GREENS PER WEEK. Coumadin Clinic [phone].

## 2022-11-22 NOTE — Progress Notes (Signed)
done

## 2022-11-23 ENCOUNTER — Telehealth: Payer: Self-pay | Admitting: Internal Medicine

## 2022-11-23 NOTE — Telephone Encounter (Signed)
*  STAT* If patient is at the pharmacy, call can be transferred to refill team.   1. Which medications need to be refilled? (please list name of each medication and dose if known) metoprolol tartrate (LOPRESSOR) 25 MG tablet  2. Which pharmacy/location (including street and city if local pharmacy) is medication to be sent to? [facility] [address], [place] - [address]    3. Do they need a 30 day or 90 day supply?  90 day supply

## 2022-11-23 NOTE — Telephone Encounter (Signed)
This is a Educational psychologist pt

## 2022-11-23 NOTE — Telephone Encounter (Signed)
Please advise If ok to refill Metoprolol Tartrate. Last filled by  Date: 09/28/2022 Department: [facility]U Ordering/Authorizing: Marcelino Duster, MD

## 2022-11-24 ENCOUNTER — Other Ambulatory Visit: Payer: Self-pay

## 2022-11-24 MED ORDER — METOPROLOL TARTRATE 25 MG PO TABS: 25.0000 mg | ORAL_TABLET | Freq: Two times a day (BID) | ORAL | 1 refills | Status: DC

## 2022-11-24 NOTE — Telephone Encounter (Signed)
RX filled, last OV with NP advised to continue Metoprolol BID.  Thanks!

## 2022-12-16 DIAGNOSIS — Z23 Encounter for immunization: Secondary | ICD-10-CM | POA: Diagnosis not present

## 2022-12-26 ENCOUNTER — Ambulatory Visit (INDEPENDENT_AMBULATORY_CARE_PROVIDER_SITE_OTHER): Payer: Medicare Other

## 2022-12-26 DIAGNOSIS — I4891 Unspecified atrial fibrillation: Secondary | ICD-10-CM | POA: Diagnosis not present

## 2022-12-27 LAB — CUP PACEART REMOTE DEVICE CHECK
Battery Remaining Longevity: 114 mo
Battery Remaining Percentage: 100 %
Brady Statistic RA Percent Paced: 99 %
Brady Statistic RV Percent Paced: 99 %
Date Time Interrogation Session: 20241022034100
Implantable Lead Connection Status: 753985
Implantable Lead Connection Status: 753985
Implantable Lead Implant Date: 20201005
Implantable Lead Implant Date: 20201005
Implantable Lead Location: 753859
Implantable Lead Location: 753860
Implantable Lead Model: 7742
Implantable Lead Model: 7841
Implantable Lead Serial Number: 1017357
Implantable Lead Serial Number: 1068132
Implantable Pulse Generator Implant Date: 20201005
Lead Channel Impedance Value: 557 Ohm
Lead Channel Impedance Value: 671 Ohm
Lead Channel Pacing Threshold Amplitude: 1.4 V
Lead Channel Pacing Threshold Pulse Width: 0.4 ms
Lead Channel Setting Pacing Amplitude: 2.3 V
Lead Channel Setting Pacing Amplitude: 2.6 V
Lead Channel Setting Pacing Pulse Width: 0.4 ms
Lead Channel Setting Sensing Sensitivity: 3 mV
Pulse Gen Serial Number: 890709
Zone Setting Status: 755011

## 2023-01-03 ENCOUNTER — Ambulatory Visit: Payer: Medicare Other | Attending: Internal Medicine

## 2023-01-03 DIAGNOSIS — I4891 Unspecified atrial fibrillation: Secondary | ICD-10-CM | POA: Diagnosis not present

## 2023-01-03 DIAGNOSIS — D6859 Other primary thrombophilia: Secondary | ICD-10-CM | POA: Diagnosis not present

## 2023-01-03 DIAGNOSIS — I824Y9 Acute embolism and thrombosis of unspecified deep veins of unspecified proximal lower extremity: Secondary | ICD-10-CM

## 2023-01-03 DIAGNOSIS — Z5181 Encounter for therapeutic drug level monitoring: Secondary | ICD-10-CM

## 2023-01-03 DIAGNOSIS — Z953 Presence of xenogenic heart valve: Secondary | ICD-10-CM | POA: Diagnosis not present

## 2023-01-03 LAB — POCT INR: INR: 4.2 — AB (ref 2.0–3.0)

## 2023-01-03 NOTE — Patient Instructions (Signed)
HOLD TODAY ONLY THEN Continue 1/2 tablet daily, EXCEPT 1 TABLET EVERY WEDNESDAY AND FRIDAY.  Recheck INR in 3 weeks.  EAT 2 HELPINGS OF GREENS PER WEEK. Coumadin Clinic [phone].

## 2023-01-10 NOTE — Progress Notes (Unsigned)
Cardiology Office Note Date:  01/11/2023  Patient ID:  Sean Brock, Sean Brock 1934/07/09, MRN [id number] PCP:  Blair Heys, MD (Inactive)  Cardiologist:  None Electrophysiologist: Lewayne Bunting, MD    Chief Complaint: 3 mon follow-up   History of Present Illness: EMERIL STILLE is a 88 y.o. male with PMH notable for SND s/p PPM, parox AFib, flutter s/p ablation, s/p AVR, CVA, CAD, HTN, orthostatic hypotension; seen today for Lewayne Bunting, MD for routine EP follow-up .    Admitted 7/24-25, presented to [facility] ER with tachycardia, found to be in atrial tach up to 120s. Initially hypotensive, though BP improved. Arrhythmia broke with IV metop, followed by PO. Discharged on 25mg  lopressor BID. I saw him for hosp follow-up, was having brief episodes of tachycardia but nothing sustained. Histograms blunted so turned on RR. Discussed permissive hyperTN with + orthostatic hypotension.   On follow-up today, he is doing very well. He denies chest pain, chest pressure palpitations or dizziness. He has stopped checking BP regularly because he has been feeling so well. He is not particularly active during the day, main hobby is "piddling"  Warfarin managed at [facility] office, no bleeding concerns. Last INR was supratherapeutic.  Device Information: Bos Sci dual chamber PPM, imp 12/2018; dx SND  AAD History: None   Past Medical History:  Diagnosis Date   AORTIC STENOSIS    a. Echo (05/26/13):  Mod LVH, vigorous LVF, EF 65-70%, no RWMA, Gr 1 DD, severe AS (mean 40 mmHg) => s/p bioprosthetic AVR 07/2013   Arthritis    BACK   Atrial fib/flutter, transient (HCC)    CAD (coronary artery disease)    a. Lexiscan myoview (5/13) with EF 70%, no ischemia or infarction;   b. LHC (06/2013):  Dist LM 30%, ostial LAD 40-50%, prox LAD 30%, ostial D1 80-90%, prox CFX 40%, RCA occluded with L-R collats => CABG (S-PDA at time of AVR)   CVA (cerebral infarction)    a. 3/14 right parietal infarct. Carotid US 3/14  showed no significant disease. He has an implanted loop recorder to look for atrial fibrillation.    DYSPEPSIA    ED (erectile dysfunction)    Former smoker    GERD (gastroesophageal reflux disease)    History of kidney stones    HYPERLIPIDEMIA    HYPERTENSION    Palpitations    a. Event monitor (5/13) with occasional PVCs, PACs but no significant arrhythmia.    S/P aortic valve replacement with bioprosthetic valve + CABG x1 07/11/2013   23 mm [facility] Ease bovine pericardial tissue valve;  Echo (08/2013):  EF 55-60%, no RWMA, AVR ok, Asc Ao 40 mm (mildly dilated), mild LAE, mild RVE, lipomatous hypertrophy, mod TR, PASP 36 mmHg   S/P CABG x 1 07/11/2013   SVG to PDA with [facility] via right thigh   Sinus bradycardia    a. 2013 - HR 40's in the office. BB stopped.   Trigeminal neuropathy    Vertigo     Past Surgical History:  Procedure Laterality Date   A-FLUTTER ABLATION N/A 11/13/2018   Procedure: A-FLUTTER ABLATION;  Surgeon: Marinus Maw, MD;  Location: [facility] INVASIVE CV LAB;  Service: Cardiovascular;  Laterality: N/A;   ABDOMINAL AORTOGRAM W/LOWER EXTREMITY N/A 02/03/2021   Procedure: ABDOMINAL AORTOGRAM W/LOWER EXTREMITY;  Surgeon: Cephus Shelling, MD;  Location: [facility] INVASIVE CV LAB;  Service: Cardiovascular;  Laterality: N/A;   AORTIC VALVE REPLACEMENT N/A 07/11/2013   Procedure: AORTIC VALVE REPLACEMENT (AVR);  Surgeon: Purcell Nails, MD;  Location: [facility] OR;  Service: Open Heart Surgery;  Laterality: N/A;   CATARACT EXTRACTION     CORONARY ARTERY BYPASS GRAFT N/A 07/11/2013   Procedure: CORONARY ARTERY BYPASS GRAFTING (CABG) x1;  Surgeon: Purcell Nails, MD;  Location: [facility] OR;  Service: Open Heart Surgery;  Laterality: N/A;   INGUINAL HERNIA REPAIR Right 01/15/2013   Procedure: HERNIA REPAIR INGUINAL ADULT;  Surgeon: Shelly Rubenstein, MD;  Location: [facility];  Service: General;  Laterality: Right;   INSERTION OF MESH Right 01/15/2013   Procedure: INSERTION OF  MESH;  Surgeon: Shelly Rubenstein, MD;  Location: [facility];  Service: General;  Laterality: Right;   INTRAOPERATIVE TRANSESOPHAGEAL ECHOCARDIOGRAM N/A 07/11/2013   Procedure: INTRAOPERATIVE TRANSESOPHAGEAL ECHOCARDIOGRAM;  Surgeon: Purcell Nails, MD;  Location: [facility] OR;  Service: Open Heart Surgery;  Laterality: N/A;   LEFT AND RIGHT HEART CATHETERIZATION WITH CORONARY ANGIOGRAM N/A 06/06/2013   Procedure: LEFT AND RIGHT HEART CATHETERIZATION WITH CORONARY ANGIOGRAM;  Surgeon: Laurey Morale, MD;  Location: [facility] CATH LAB;  Service: Cardiovascular;  Laterality: N/A;   LITHOTRIPSY     LOOP RECORDER IMPLANT  04/15/2013   MDT LinQ implanred by Dr Ladona Ridgel for cryptogenic stroke   LOOP RECORDER IMPLANT N/A 04/14/2013   Procedure: LOOP RECORDER IMPLANT;  Surgeon: Marinus Maw, MD;  Location: [facility] CATH LAB;  Service: Cardiovascular;  Laterality: N/A;   LOOP RECORDER REMOVAL N/A 11/13/2018   Procedure: LOOP RECORDER REMOVAL;  Surgeon: Marinus Maw, MD;  [facility];  Service: Cardiovascular;  Laterality: N/A;   MOUTH SURGERY     PACEMAKER IMPLANT N/A 12/09/2018   Procedure: PACEMAKER IMPLANT;  Surgeon: Marinus Maw, MD;  [facility]: [facility];  Service: Cardiovascular;  Laterality: N/A;   PERIPHERAL VASCULAR INTERVENTION Left 02/03/2021   Procedure: PERIPHERAL VASCULAR INTERVENTION;  Surgeon: Cephus Shelling, MD;  Location: [facility];  Service: Cardiovascular;  Laterality: Left;  sfa    Current Outpatient Medications  Medication Instructions   acetaminophen (TYLENOL) 500 mg, Oral, Every 6 hours PRN   aspirin EC 81 mg, Oral, Daily   B Complex Vitamins (VITAMIN B COMPLEX) TABS Daily   Carboxymethylcellul-Glycerin (CLEAR EYES FOR DRY EYES OP) 1 drop, Both Eyes, Daily   Emollient (MEDERMA AG FACE EX) 1 application , Topical, Daily PRN   famotidine (PEPCID) 20 mg, Oral, 2 times daily   ferrous sulfate 325 mg, Oral, Daily with breakfast   metoprolol  tartrate (LOPRESSOR) 25 mg, Oral, 2 times daily   polyethylene glycol (MIRALAX / GLYCOLAX) 17 g, Oral, Daily PRN   rosuvastatin (CRESTOR) 10 mg, Oral, Every evening   tamsulosin (FLOMAX) 0.4 mg, Oral, Every evening   warfarin (COUMADIN) 5 MG tablet TAKE 1/2 TO 1 (ONE-HALF TO ONE) TABLET BY MOUTH ONCE DAILY    Social History:  The patient  reports that he quit smoking about 13 years ago. His smoking use included cigarettes. He started smoking about 43 years ago. He has a 30 pack-year smoking history. He has never used smokeless tobacco. He reports that he does not drink alcohol and does not use drugs.   Family History:  The patient's family history includes Cancer in his sister; Diabetes in his mother and unknown relative; Heart attack in his father and mother; Hypertension in his father and unknown relative; Lung cancer in his unknown relative.  ROS:  Please see the history of present illness. All other  systems are reviewed and otherwise negative.   PHYSICAL EXAM:  VS:  BP 130/70 (BP Location: Left Arm, Patient Position: Sitting)   Pulse 60   Ht 6\' 3"  (1.905 m)   Wt 192 lb 9.6 oz (87.4 kg)   SpO2 98%   BMI 24.07 kg/m  BMI: Body mass index is 24.07 kg/m.  GEN- The patient is well appearing, alert and oriented x 3 today.   Lungs- Clear to ausculation bilaterally, normal work of breathing.  Heart- Regular rate and rhythm, no murmurs, rubs or gallops Extremities- No peripheral edema, warm, dry Skin-  device pocket well-healed, no tethering   Device interrogation done today and reviewed by myself:  Battery 9 years Dependent to VVI 40 Lead thresholds, impedence, sensing stable  No episodes Histograms blunted at LRL No changes made today  EKG is ordered. Personal review of EKG from today shows:   EKG Interpretation Date/Time:  Thursday January 11 2023 09:16:31 EST Ventricular Rate:  60 PR Interval:    QRS Duration:  150 QT Interval:  454 QTC Calculation: 454 R  Axis:   34  Text Interpretation: AV dual-paced rhythm Confirmed by Sherie Don [phone]) on 01/11/2023 9:20:53 AM    Recent Labs: 04/01/2022: ALT 13 09/27/2022: Magnesium 2.2; TSH 1.812 09/28/2022: BUN 16; Creatinine, Ser 0.75; Hemoglobin 12.2; Platelets 185; Potassium 3.9; Sodium 139  No results found for requested labs within last 365 days.   CrCl cannot be calculated (Patient's most recent lab result is older than the maximum 21 days allowed.).   Wt Readings from Last 3 Encounters:  01/11/23 192 lb 9.6 oz (87.4 kg)  10/11/22 192 lb 3.2 oz (87.2 kg)  09/27/22 188 lb (85.3 kg)     Additional studies reviewed include: Previous EP, cardiology notes.   TTE, 04/28/2022  1. Left ventricular ejection fraction, by estimation, is 55 to 60%. The left ventricle has normal function. The left ventricle has no regional wall motion abnormalities. There is severe concentric left ventricular hypertrophy. Left ventricular diastolic  parameters are consistent with Grade II diastolic dysfunction (pseudonormalization).   2. Right ventricular systolic function is normal. The right ventricular size is Dilated. There is normal pulmonary artery systolic pressure.   3. Left atrial size was moderately dilated.   4. Right atrial size was severely dilated.   5. A small pericardial effusion is present. The pericardial effusion is posterior to the left ventricle.   6. The mitral valve is grossly normal. Mild mitral valve regurgitation. No evidence of mitral stenosis.   7. Variable color Doppler flow of TR- appears severe in image 22 and less in other views. Tricuspid valve regurgitation estimated at moderate due to variance from images.   8. The aortic valve was not well visualized-23 mm [employer]. Aortic valve regurgitation is not visualized. Echo findings are consistent with normal structure and function of the aortic valve prosthesis. Aortic valve mean gradient measures 12.0 mmHg.   9. Aortic dilatation  noted. There is mild dilatation of the ascending aorta, measuring 40 mm.  10. The inferior vena cava is normal in size with greater than 50% respiratory variability, suggesting right atrial pressure of 3 mmHg.    ASSESSMENT AND PLAN:  #) parox Aflutter, AFib #) atach No recent arrhythmia episodes by device or symptoms Continued 25mg  lopressor BID  #) Hypercoag d/t parox afib CHA2DS2-VASc Score = 6 [CHF History: 0, HTN History: 1, Diabetes History: 0, Stroke History: 2, Vascular Disease History: 1, Age Score: 2, Gender Score: 0].  Therefore, the patient's annual risk of stroke is 9.7 %.    Stroke ppx - warfarin, managed at [facility] office  No bleeding concerns}   #) HTN #) orthostatic  Permissive hypertension with known orthostatic hypotension BP goal less than 160/90 Continue lopressor 25mg  BID  #) SND s/p Bos Sci PPM Device functioning well, see paceart for details    Current medicines are reviewed at length with the patient today.   The patient does not have concerns regarding his medicines.  The following changes were made today:  none  Labs/ tests ordered today include:  Orders Placed This Encounter  Procedures   EKG 12-Lead     Disposition: Follow up with Dr. Ladona Ridgel or EP APP in 3 months   Signed, Sherie Don, NP  01/11/23  9:47 AM  Electrophysiology [facility]

## 2023-01-11 ENCOUNTER — Encounter: Payer: Self-pay | Admitting: Cardiology

## 2023-01-11 ENCOUNTER — Ambulatory Visit: Payer: Medicare Other | Attending: Cardiology | Admitting: Cardiology

## 2023-01-11 VITALS — BP 130/70 | HR 60 | Ht 75.0 in | Wt 192.6 lb

## 2023-01-11 DIAGNOSIS — Z950 Presence of cardiac pacemaker: Secondary | ICD-10-CM | POA: Diagnosis not present

## 2023-01-11 DIAGNOSIS — I495 Sick sinus syndrome: Secondary | ICD-10-CM

## 2023-01-11 DIAGNOSIS — I480 Paroxysmal atrial fibrillation: Secondary | ICD-10-CM

## 2023-01-11 LAB — CUP PACEART INCLINIC DEVICE CHECK
Date Time Interrogation Session: 20241107100403
Implantable Lead Connection Status: 753985
Implantable Lead Connection Status: 753985
Implantable Lead Implant Date: 20201005
Implantable Lead Implant Date: 20201005
Implantable Lead Location: 753859
Implantable Lead Location: 753860
Implantable Lead Model: 7742
Implantable Lead Model: 7841
Implantable Lead Serial Number: 1017357
Implantable Lead Serial Number: 1068132
Implantable Pulse Generator Implant Date: 20201005
Lead Channel Impedance Value: 569 Ohm
Lead Channel Impedance Value: 678 Ohm
Lead Channel Pacing Threshold Amplitude: 0.3 V
Lead Channel Pacing Threshold Amplitude: 1.2 V
Lead Channel Pacing Threshold Pulse Width: 0.4 ms
Lead Channel Pacing Threshold Pulse Width: 0.4 ms
Lead Channel Setting Pacing Amplitude: 2.3 V
Lead Channel Setting Pacing Amplitude: 2.6 V
Lead Channel Setting Pacing Pulse Width: 0.4 ms
Lead Channel Setting Sensing Sensitivity: 3 mV
Pulse Gen Serial Number: 890709
Zone Setting Status: 755011

## 2023-01-11 NOTE — Patient Instructions (Signed)
Medication Instructions:  The current medical regimen is effective;  continue present plan and medications.  *If you need a refill on your cardiac medications before your next appointment, please call your pharmacy*   Follow-Up: At [facility], you and your health needs are our priority.  As part of our continuing mission to provide you with exceptional heart care, we have created designated Provider Care Teams.  These Care Teams include your primary Cardiologist (physician) and Advanced Practice Providers (APPs -  Physician Assistants and Nurse Practitioners) who all work together to provide you with the care you need, when you need it.  We recommend signing up for the patient portal called "MyChart".  Sign up information is provided on this After Visit Summary.  MyChart is used to connect with patients for Virtual Visits (Telemedicine).  Patients are able to view lab/test results, encounter notes, upcoming appointments, etc.  Non-urgent messages can be sent to your provider as well.   To learn more about what you can do with MyChart, go to [email or web address].    Your next appointment:   4 month(s)  Provider:   Sherie Don, NP

## 2023-01-12 NOTE — Progress Notes (Signed)
Remote pacemaker transmission.   

## 2023-01-16 DIAGNOSIS — D509 Iron deficiency anemia, unspecified: Secondary | ICD-10-CM | POA: Diagnosis not present

## 2023-01-16 DIAGNOSIS — I4891 Unspecified atrial fibrillation: Secondary | ICD-10-CM | POA: Diagnosis not present

## 2023-01-16 DIAGNOSIS — E7800 Pure hypercholesterolemia, unspecified: Secondary | ICD-10-CM | POA: Diagnosis not present

## 2023-01-16 DIAGNOSIS — D6869 Other thrombophilia: Secondary | ICD-10-CM | POA: Diagnosis not present

## 2023-01-16 DIAGNOSIS — I2510 Atherosclerotic heart disease of native coronary artery without angina pectoris: Secondary | ICD-10-CM | POA: Diagnosis not present

## 2023-01-16 DIAGNOSIS — K219 Gastro-esophageal reflux disease without esophagitis: Secondary | ICD-10-CM | POA: Diagnosis not present

## 2023-01-16 DIAGNOSIS — I119 Hypertensive heart disease without heart failure: Secondary | ICD-10-CM | POA: Diagnosis not present

## 2023-01-22 ENCOUNTER — Other Ambulatory Visit: Payer: Self-pay | Admitting: Cardiology

## 2023-01-24 ENCOUNTER — Ambulatory Visit: Payer: Medicare Other | Attending: Internal Medicine

## 2023-01-24 DIAGNOSIS — Z953 Presence of xenogenic heart valve: Secondary | ICD-10-CM

## 2023-01-24 DIAGNOSIS — I824Y9 Acute embolism and thrombosis of unspecified deep veins of unspecified proximal lower extremity: Secondary | ICD-10-CM | POA: Diagnosis not present

## 2023-01-24 DIAGNOSIS — D6859 Other primary thrombophilia: Secondary | ICD-10-CM

## 2023-01-24 DIAGNOSIS — I4891 Unspecified atrial fibrillation: Secondary | ICD-10-CM | POA: Diagnosis not present

## 2023-01-24 DIAGNOSIS — Z5181 Encounter for therapeutic drug level monitoring: Secondary | ICD-10-CM

## 2023-01-24 LAB — POCT INR: INR: 3 (ref 2.0–3.0)

## 2023-01-24 NOTE — Patient Instructions (Signed)
Continue 1/2 tablet daily, EXCEPT 1 TABLET EVERY WEDNESDAY AND FRIDAY.  Recheck INR in 6 weeks.   Coumadin Clinic [phone].

## 2023-03-06 ENCOUNTER — Ambulatory Visit: Payer: Medicare Other | Attending: Internal Medicine

## 2023-03-06 DIAGNOSIS — I4891 Unspecified atrial fibrillation: Secondary | ICD-10-CM

## 2023-03-06 DIAGNOSIS — I824Y9 Acute embolism and thrombosis of unspecified deep veins of unspecified proximal lower extremity: Secondary | ICD-10-CM | POA: Diagnosis not present

## 2023-03-06 DIAGNOSIS — Z953 Presence of xenogenic heart valve: Secondary | ICD-10-CM | POA: Diagnosis not present

## 2023-03-06 DIAGNOSIS — D6859 Other primary thrombophilia: Secondary | ICD-10-CM

## 2023-03-06 DIAGNOSIS — Z5181 Encounter for therapeutic drug level monitoring: Secondary | ICD-10-CM | POA: Diagnosis not present

## 2023-03-06 LAB — POCT INR: INR: 3.5 — AB (ref 2.0–3.0)

## 2023-03-06 NOTE — Patient Instructions (Signed)
 HOLD TONIGHT ONLY THEN Continue 1/2 tablet daily, EXCEPT 1 TABLET EVERY WEDNESDAY AND FRIDAY.  Recheck INR in 6 weeks.   Coumadin Clinic [phone].

## 2023-03-27 ENCOUNTER — Ambulatory Visit (INDEPENDENT_AMBULATORY_CARE_PROVIDER_SITE_OTHER): Payer: Medicare Other

## 2023-03-27 DIAGNOSIS — I495 Sick sinus syndrome: Secondary | ICD-10-CM

## 2023-03-27 LAB — CUP PACEART REMOTE DEVICE CHECK
Battery Remaining Longevity: 108 mo
Battery Remaining Percentage: 100 %
Brady Statistic RA Percent Paced: 99 %
Brady Statistic RV Percent Paced: 100 %
Date Time Interrogation Session: 20250121034100
Implantable Lead Connection Status: 753985
Implantable Lead Connection Status: 753985
Implantable Lead Implant Date: 20201005
Implantable Lead Implant Date: 20201005
Implantable Lead Location: 753859
Implantable Lead Location: 753860
Implantable Lead Model: 7742
Implantable Lead Model: 7841
Implantable Lead Serial Number: 1017357
Implantable Lead Serial Number: 1068132
Implantable Pulse Generator Implant Date: 20201005
Lead Channel Impedance Value: 546 Ohm
Lead Channel Impedance Value: 663 Ohm
Lead Channel Pacing Threshold Amplitude: 1.7 V
Lead Channel Pacing Threshold Pulse Width: 0.4 ms
Lead Channel Setting Pacing Amplitude: 2.3 V
Lead Channel Setting Pacing Amplitude: 2.6 V
Lead Channel Setting Pacing Pulse Width: 0.4 ms
Lead Channel Setting Sensing Sensitivity: 3 mV
Pulse Gen Serial Number: 890709
Zone Setting Status: 755011

## 2023-03-30 ENCOUNTER — Encounter: Payer: Self-pay | Admitting: Internal Medicine

## 2023-04-18 ENCOUNTER — Ambulatory Visit: Payer: Medicare Other | Attending: Internal Medicine

## 2023-04-18 DIAGNOSIS — Z5181 Encounter for therapeutic drug level monitoring: Secondary | ICD-10-CM

## 2023-04-18 DIAGNOSIS — Z953 Presence of xenogenic heart valve: Secondary | ICD-10-CM

## 2023-04-18 DIAGNOSIS — I824Y9 Acute embolism and thrombosis of unspecified deep veins of unspecified proximal lower extremity: Secondary | ICD-10-CM | POA: Diagnosis not present

## 2023-04-18 DIAGNOSIS — D6859 Other primary thrombophilia: Secondary | ICD-10-CM | POA: Diagnosis not present

## 2023-04-18 DIAGNOSIS — I4891 Unspecified atrial fibrillation: Secondary | ICD-10-CM | POA: Diagnosis not present

## 2023-04-18 LAB — POCT INR: INR: 2.8 (ref 2.0–3.0)

## 2023-04-18 NOTE — Patient Instructions (Signed)
Continue 1/2 tablet daily, EXCEPT 1 TABLET EVERY WEDNESDAY AND FRIDAY.  Recheck INR in 6 weeks.   Coumadin Clinic [phone].

## 2023-04-23 ENCOUNTER — Other Ambulatory Visit: Payer: Self-pay | Admitting: Cardiology

## 2023-05-07 NOTE — Progress Notes (Signed)
 Remote pacemaker transmission.

## 2023-05-10 NOTE — Progress Notes (Signed)
 Electrophysiology Clinic Note    Date:  05/11/2023  Patient ID:  Sean, Brock 06-Nov-1934, MRN [id number] PCP:  Debroah Loop, DO  Cardiologist:  None Electrophysiologist: Lewayne Bunting, MD   Discussed the use of AI scribe software for clinical note transcription with the patient, who gave verbal consent to proceed.   Patient Profile    Chief Complaint: 3mon follow-up  History of Present Illness: Sean Brock is a 89 y.o. male with PMH notable for SND s/p PPM, parox AFib, flutter s/p ablation, s/p AVR, CVA, CAD, HTN, orthostatic hypotension ; seen today for Lewayne Bunting, MD for routine electrophysiology followup.   Admitted 7/24-25, presented to [facility] ER with tachycardia, found to be in atrial tach up to 120s. Initially hypotensive, though BP improved. Arrhythmia broke with IV metop, followed by PO. Discharged on 25mg  lopressor BID. I saw him for hosp follow-up, was having brief episodes of tachycardia but nothing sustained. Histograms blunted so turned on RR. Discussed permissive hyperTN with + orthostatic hypotension.   On follow-up today, he is without complaints and his usual joking self. He denies palpitation episodes, chest pain, chest pressure. He brings in BP log, most readings 120-140 with rare 160. Systolic in 160 tends to be when he checks BP prior to AM medications. He denies increased lower extremity edema. He does have rare intermittent edema that resolves by the following morning. He does have brief episodes of dizziness when he rises quickly, these symptoms resolve quickly.   Warfarin mananaged at [facility] office, last several readings are therapeutic.    Device Information: Bos Sci dual chamber PPM, imp 12/2018; dx SND   AAD History: None     ROS:  Please see the history of present illness. All other systems are reviewed and otherwise negative.    Physical Exam    VS:  BP (!) 144/82 (BP Location: Left Arm, Patient Position: Sitting, Cuff Size:  Normal)   Pulse 60   Ht 6\' 3"  (1.905 m)   Wt 198 lb 6.4 oz (90 kg)   SpO2 96%   BMI 24.80 kg/m  BMI: Body mass index is 24.8 kg/m.  Wt Readings from Last 3 Encounters:  05/11/23 198 lb 6.4 oz (90 kg)  01/11/23 192 lb 9.6 oz (87.4 kg)  10/11/22 192 lb 3.2 oz (87.2 kg)     GEN- The patient is well appearing, alert and oriented x 3 today.   Lungs- Clear to ausculation bilaterally, normal work of breathing.  Heart- Regular rate and rhythm, no murmurs, rubs or gallops Extremities- No peripheral edema, warm, dry Skin-  device pocket well-healed, no tethering   Device interrogation done today and reviewed by myself:  Battery 9 years Lead thresholds, impedence stable  Atrial and ventricular dependent down to VVI 30 High V pacing No episodes Histograms blunted at LRL Increased minute ventilation response factor from 5 > 6 No other changes made today   Studies Reviewed   Previous EP, cardiology notes.    EKG is ordered. Personal review of EKG from today shows:    EKG Interpretation Date/Time:  Friday May 11 2023 09:55:56 EST Ventricular Rate:  60 PR Interval:  206 QRS Duration:  162 QT Interval:  466 QTC Calculation: 466 R Axis:   -9  Text Interpretation: AV dual-paced rhythm Confirmed by Sherie Don [phone]) on 05/11/2023 10:39:48 AM    TTE, 04/28/2022  1. Left ventricular ejection fraction, by estimation, is 55 to 60%. The left ventricle has normal  function. The left ventricle has no regional wall motion abnormalities. There is severe concentric left ventricular hypertrophy. Left ventricular diastolic  parameters are consistent with Grade II diastolic dysfunction (pseudonormalization).   2. Right ventricular systolic function is normal. The right ventricular size is Dilated. There is normal pulmonary artery systolic pressure.   3. Left atrial size was moderately dilated.   4. Right atrial size was severely dilated.   5. A small pericardial effusion is present. The  pericardial effusion is posterior to the left ventricle.   6. The mitral valve is grossly normal. Mild mitral valve regurgitation. No evidence of mitral stenosis.   7. Variable color Doppler flow of TR- appears severe in image 22 and less in other views. Tricuspid valve regurgitation estimated at moderate due to variance from images.   8. The aortic valve was not well visualized-23 mm [employer]. Aortic valve regurgitation is not visualized. Echo findings are consistent with normal structure and function of the aortic valve prosthesis. Aortic valve mean gradient measures 12.0 mmHg.   9. Aortic dilatation noted. There is mild dilatation of the ascending aorta, measuring 40 mm.  10. The inferior vena cava is normal in size with greater than 50% respiratory variability, suggesting right atrial pressure of 3 mmHg.      Assessment and Plan     #) parox Afib, Aflutter #) atach No recent arrhythmia episodes by symptoms or via device Continue 25mg  lopressor BID  #) Hypercoag d/t parox afib CHA2DS2-VASc Score = at least 6 [CHF History: 0, HTN History: 1, Diabetes History: 0, Stroke History: 2, Vascular Disease History: 1, Age Score: 2, Gender Score: 0].  Therefore, the patient's annual risk of stroke is 9.7 %.    Stroke ppx - warfarin, managed at [facility] office  INRs therapeutic  No bleeding concerns   #) HTN #) orthostatic  Permissive hypertension with known orthostatic hypotension He's maintaining BP goal less than 160/90 Continue lopressor 25mg  BID   #) SND s/p Bos Sci PPM Device functioning well, see paceart for details High ventricular pacing with wide paced QRS No s/s of HF, continue to monitor       Current medicines are reviewed at length with the patient today.   The patient does not have concerns regarding his medicines.  The following changes were made today:  none  Labs/ tests ordered today include:  Orders Placed This Encounter  Procedures   EKG 12-Lead      Disposition: Follow up with Dr. Ladona Ridgel or EP APP in 6 months   Signed, Sherie Don, NP  05/11/23  10:42 AM  Electrophysiology [facility]

## 2023-05-11 ENCOUNTER — Ambulatory Visit: Payer: Medicare Other | Attending: Cardiology | Admitting: Cardiology

## 2023-05-11 ENCOUNTER — Encounter: Payer: Medicare Other | Admitting: Cardiology

## 2023-05-11 VITALS — BP 144/82 | HR 60 | Ht 75.0 in | Wt 198.4 lb

## 2023-05-11 DIAGNOSIS — I495 Sick sinus syndrome: Secondary | ICD-10-CM

## 2023-05-11 DIAGNOSIS — I4891 Unspecified atrial fibrillation: Secondary | ICD-10-CM

## 2023-05-11 DIAGNOSIS — D6869 Other thrombophilia: Secondary | ICD-10-CM

## 2023-05-11 DIAGNOSIS — I951 Orthostatic hypotension: Secondary | ICD-10-CM

## 2023-05-11 DIAGNOSIS — Z950 Presence of cardiac pacemaker: Secondary | ICD-10-CM | POA: Diagnosis not present

## 2023-05-11 DIAGNOSIS — I10 Essential (primary) hypertension: Secondary | ICD-10-CM

## 2023-05-11 LAB — CUP PACEART INCLINIC DEVICE CHECK
Date Time Interrogation Session: 20250307145737
Implantable Lead Connection Status: 753985
Implantable Lead Connection Status: 753985
Implantable Lead Implant Date: 20201005
Implantable Lead Implant Date: 20201005
Implantable Lead Location: 753859
Implantable Lead Location: 753860
Implantable Lead Model: 7742
Implantable Lead Model: 7841
Implantable Lead Serial Number: 1017357
Implantable Lead Serial Number: 1068132
Implantable Pulse Generator Implant Date: 20201005
Lead Channel Impedance Value: 557 Ohm
Lead Channel Impedance Value: 658 Ohm
Lead Channel Pacing Threshold Amplitude: 0.1 V
Lead Channel Pacing Threshold Amplitude: 1.2 V
Lead Channel Pacing Threshold Pulse Width: 0.4 ms
Lead Channel Pacing Threshold Pulse Width: 0.4 ms
Lead Channel Setting Pacing Amplitude: 2.3 V
Lead Channel Setting Pacing Amplitude: 2.6 V
Lead Channel Setting Pacing Pulse Width: 0.4 ms
Lead Channel Setting Sensing Sensitivity: 3 mV
Pulse Gen Serial Number: 890709
Zone Setting Status: 755011

## 2023-05-11 NOTE — Patient Instructions (Signed)
 Medication Instructions:  The current medical regimen is effective;  continue present plan and medications.  *If you need a refill on your cardiac medications before your next appointment, please call your pharmacy*   Follow-Up: At [facility], you and your health needs are our priority.  As part of our continuing mission to provide you with exceptional heart care, we have created designated Provider Care Teams.  These Care Teams include your primary Cardiologist (physician) and Advanced Practice Providers (APPs -  Physician Assistants and Nurse Practitioners) who all work together to provide you with the care you need, when you need it.  We recommend signing up for the patient portal called "MyChart".  Sign up information is provided on this After Visit Summary.  MyChart is used to connect with patients for Virtual Visits (Telemedicine).  Patients are able to view lab/test results, encounter notes, upcoming appointments, etc.  Non-urgent messages can be sent to your provider as well.   To learn more about what you can do with MyChart, go to [email or web address].    Your next appointment:   6 month(s)  Provider:   Sherie Don, NP

## 2023-05-26 ENCOUNTER — Other Ambulatory Visit: Payer: Self-pay | Admitting: Internal Medicine

## 2023-05-26 DIAGNOSIS — I4891 Unspecified atrial fibrillation: Secondary | ICD-10-CM

## 2023-05-28 NOTE — Telephone Encounter (Signed)
 Prescription refill request received for warfarin Lov: 05/11/23 ([facility])  Next INR check: 05/30/23 Warfarin tablet strength: 5mg   Appropriate dose. Refill sent.

## 2023-05-30 ENCOUNTER — Ambulatory Visit: Payer: Medicare Other | Attending: Internal Medicine

## 2023-05-30 DIAGNOSIS — Z953 Presence of xenogenic heart valve: Secondary | ICD-10-CM

## 2023-05-30 DIAGNOSIS — I4891 Unspecified atrial fibrillation: Secondary | ICD-10-CM

## 2023-05-30 DIAGNOSIS — Z5181 Encounter for therapeutic drug level monitoring: Secondary | ICD-10-CM | POA: Diagnosis not present

## 2023-05-30 DIAGNOSIS — D6859 Other primary thrombophilia: Secondary | ICD-10-CM

## 2023-05-30 DIAGNOSIS — I824Y9 Acute embolism and thrombosis of unspecified deep veins of unspecified proximal lower extremity: Secondary | ICD-10-CM | POA: Diagnosis not present

## 2023-05-30 LAB — POCT INR: INR: 2.8 (ref 2.0–3.0)

## 2023-05-30 NOTE — Patient Instructions (Signed)
 Description   Continue 1/2 tablet daily, EXCEPT 1 TABLET EVERY WEDNESDAY AND FRIDAY.  Recheck INR in 6 weeks.   Coumadin Clinic [phone].

## 2023-06-26 ENCOUNTER — Ambulatory Visit: Payer: Medicare Other

## 2023-06-26 DIAGNOSIS — I4891 Unspecified atrial fibrillation: Secondary | ICD-10-CM | POA: Diagnosis not present

## 2023-06-27 ENCOUNTER — Encounter: Payer: Self-pay | Admitting: Internal Medicine

## 2023-06-27 LAB — CUP PACEART REMOTE DEVICE CHECK
Battery Remaining Longevity: 102 mo
Battery Remaining Percentage: 100 %
Brady Statistic RA Percent Paced: 99 %
Brady Statistic RV Percent Paced: 99 %
Date Time Interrogation Session: 20250422043600
Implantable Lead Connection Status: 753985
Implantable Lead Connection Status: 753985
Implantable Lead Implant Date: 20201005
Implantable Lead Implant Date: 20201005
Implantable Lead Location: 753859
Implantable Lead Location: 753860
Implantable Lead Model: 7742
Implantable Lead Model: 7841
Implantable Lead Serial Number: 1017357
Implantable Lead Serial Number: 1068132
Implantable Pulse Generator Implant Date: 20201005
Lead Channel Impedance Value: 521 Ohm
Lead Channel Impedance Value: 576 Ohm
Lead Channel Pacing Threshold Amplitude: 1.6 V
Lead Channel Pacing Threshold Pulse Width: 0.4 ms
Lead Channel Setting Pacing Amplitude: 2.3 V
Lead Channel Setting Pacing Amplitude: 2.6 V
Lead Channel Setting Pacing Pulse Width: 0.4 ms
Lead Channel Setting Sensing Sensitivity: 3 mV
Pulse Gen Serial Number: 890709
Zone Setting Status: 755011

## 2023-07-11 ENCOUNTER — Ambulatory Visit: Attending: Internal Medicine

## 2023-07-11 DIAGNOSIS — I4891 Unspecified atrial fibrillation: Secondary | ICD-10-CM | POA: Diagnosis not present

## 2023-07-11 DIAGNOSIS — D6859 Other primary thrombophilia: Secondary | ICD-10-CM

## 2023-07-11 DIAGNOSIS — I824Y9 Acute embolism and thrombosis of unspecified deep veins of unspecified proximal lower extremity: Secondary | ICD-10-CM | POA: Diagnosis not present

## 2023-07-11 DIAGNOSIS — Z5181 Encounter for therapeutic drug level monitoring: Secondary | ICD-10-CM | POA: Diagnosis not present

## 2023-07-11 DIAGNOSIS — Z953 Presence of xenogenic heart valve: Secondary | ICD-10-CM | POA: Diagnosis not present

## 2023-07-11 LAB — POCT INR: INR: 2.4 (ref 2.0–3.0)

## 2023-07-11 NOTE — Patient Instructions (Signed)
 Continue 1/2 tablet daily, EXCEPT 1 TABLET EVERY WEDNESDAY AND FRIDAY.  Recheck INR in 6 weeks.   Coumadin Clinic [phone].

## 2023-07-12 ENCOUNTER — Ambulatory Visit: Admitting: Student

## 2023-07-18 ENCOUNTER — Other Ambulatory Visit: Payer: Self-pay | Admitting: Cardiology

## 2023-07-20 ENCOUNTER — Other Ambulatory Visit: Payer: Self-pay | Admitting: *Deleted

## 2023-07-20 DIAGNOSIS — D509 Iron deficiency anemia, unspecified: Secondary | ICD-10-CM | POA: Diagnosis not present

## 2023-07-20 DIAGNOSIS — E782 Mixed hyperlipidemia: Secondary | ICD-10-CM | POA: Diagnosis not present

## 2023-07-20 DIAGNOSIS — I4891 Unspecified atrial fibrillation: Secondary | ICD-10-CM | POA: Diagnosis not present

## 2023-07-20 DIAGNOSIS — D6869 Other thrombophilia: Secondary | ICD-10-CM | POA: Diagnosis not present

## 2023-07-20 DIAGNOSIS — K219 Gastro-esophageal reflux disease without esophagitis: Secondary | ICD-10-CM | POA: Diagnosis not present

## 2023-07-20 DIAGNOSIS — I70213 Atherosclerosis of native arteries of extremities with intermittent claudication, bilateral legs: Secondary | ICD-10-CM

## 2023-07-20 DIAGNOSIS — I2510 Atherosclerotic heart disease of native coronary artery without angina pectoris: Secondary | ICD-10-CM | POA: Diagnosis not present

## 2023-07-20 DIAGNOSIS — G8929 Other chronic pain: Secondary | ICD-10-CM | POA: Diagnosis not present

## 2023-07-20 DIAGNOSIS — I119 Hypertensive heart disease without heart failure: Secondary | ICD-10-CM | POA: Diagnosis not present

## 2023-07-20 DIAGNOSIS — Z0000 Encounter for general adult medical examination without abnormal findings: Secondary | ICD-10-CM | POA: Diagnosis not present

## 2023-07-31 ENCOUNTER — Ambulatory Visit: Attending: Vascular Surgery | Admitting: Vascular Surgery

## 2023-07-31 ENCOUNTER — Ambulatory Visit (HOSPITAL_BASED_OUTPATIENT_CLINIC_OR_DEPARTMENT_OTHER)
Admission: RE | Admit: 2023-07-31 | Discharge: 2023-07-31 | Disposition: A | Discharge status: Home / Self Care | Source: Ambulatory Visit | Attending: Vascular Surgery | Admitting: Vascular Surgery

## 2023-07-31 ENCOUNTER — Ambulatory Visit (HOSPITAL_COMMUNITY)
Admission: RE | Admit: 2023-07-31 | Discharge: 2023-07-31 | Disposition: A | Discharge status: Home / Self Care | Source: Ambulatory Visit | Attending: Vascular Surgery | Admitting: Vascular Surgery

## 2023-07-31 ENCOUNTER — Encounter: Payer: Self-pay | Admitting: Vascular Surgery

## 2023-07-31 VITALS — BP 158/81 | HR 59 | Temp 98.3°F | Resp 20 | Ht 75.0 in | Wt 193.4 lb

## 2023-07-31 DIAGNOSIS — I70213 Atherosclerosis of native arteries of extremities with intermittent claudication, bilateral legs: Secondary | ICD-10-CM | POA: Diagnosis not present

## 2023-07-31 LAB — VAS US ABI WITH/WO TBI
Left ABI: 1.16
Right ABI: 1

## 2023-07-31 NOTE — Progress Notes (Signed)
 Patient name: Sean Brock MRN: [id number] DOB: [birth year] Sex: male  REASON FOR CONSULT: 1 year follow-up after previous left lower extremity intervention  HPI: Sean Brock is a 89 y.o. male, with history of coronary artery disease status post CABG with aortic valve replacement on coumadin , previous stroke, hypertension, hyperlipidemia, former smoker that presents for 1 year follow-up after previous left leg intervention.  He was initially seen with bilateral lower extremity short distance lifestyle limiting claudication.  On 02/03/2021 he underwent left SFA above-knee popliteal angioplasty with stenting for occlusion at [facility].    On 1 year follow-up today denies any lower extremity complaints.  Denies any claudication.  Walking with his cane.  States he has back problems.  Past Medical History:  Diagnosis Date   AORTIC STENOSIS    a. Echo (05/26/13):  Mod LVH, vigorous LVF, EF 65-70%, no RWMA, Gr 1 DD, severe AS (mean 40 mmHg) => s/p bioprosthetic AVR 07/2013   Arthritis    BACK   Atrial fib/flutter, transient (HCC)    CAD (coronary artery disease)    a. Lexiscan  myoview  (5/13) with EF 70%, no ischemia or infarction;   b. LHC (06/2013):  Dist LM 30%, ostial LAD 40-50%, prox LAD 30%, ostial D1 80-90%, prox CFX 40%, RCA occluded with L-R collats => CABG (S-PDA at time of AVR)   CVA (cerebral infarction)    a. 3/14 right parietal infarct. Carotid US  3/14 showed no significant disease. He has an implanted loop recorder to look for atrial fibrillation.    DYSPEPSIA    ED (erectile dysfunction)    Former smoker    GERD (gastroesophageal reflux disease)    History of kidney stones    HYPERLIPIDEMIA    HYPERTENSION    Palpitations    a. Event monitor (5/13) with occasional PVCs, PACs but no significant arrhythmia.    S/P aortic valve replacement with bioprosthetic valve + CABG x1 07/11/2013   23 mm [facility] Ease bovine pericardial tissue valve;  Echo (08/2013):  EF 55-60%,  no RWMA, AVR ok, Asc Ao 40 mm (mildly dilated), mild LAE, mild RVE, lipomatous hypertrophy, mod TR, PASP 36 mmHg   S/P CABG x 1 07/11/2013   SVG to PDA with [facility] via right thigh   Sinus bradycardia    a. 2013 - HR 40's in the office. BB stopped.   Trigeminal neuropathy    Vertigo     Past Surgical History:  Procedure Laterality Date   A-FLUTTER ABLATION N/A 11/13/2018   Procedure: A-FLUTTER ABLATION;  Surgeon: Tammie Fall, MD;  [facility];  Service: Cardiovascular;  Laterality: N/A;   ABDOMINAL AORTOGRAM W/LOWER EXTREMITY N/A 02/03/2021   Procedure: ABDOMINAL AORTOGRAM W/LOWER EXTREMITY;  Surgeon: Young Hensen, MD;  [facility];  Service: Cardiovascular;  Laterality: N/A;   AORTIC VALVE REPLACEMENT N/A 07/11/2013   Procedure: AORTIC VALVE REPLACEMENT (AVR);  Surgeon: Gardenia Jump, MD;  Location: [facility] OR;  Service: Open Heart Surgery;  Laterality: N/A;   CATARACT EXTRACTION     CORONARY ARTERY BYPASS GRAFT N/A 07/11/2013   Procedure: CORONARY ARTERY BYPASS GRAFTING (CABG) x1;  Surgeon: Gardenia Jump, MD;  [facility] OR;  Service: Open Heart Surgery;  Laterality: N/A;   INGUINAL HERNIA REPAIR Right 01/15/2013   Procedure: HERNIA REPAIR INGUINAL ADULT;  Surgeon: Rogena Class, MD;  Location: [facility];  Service: General;  Laterality: Right;   INSERTION OF MESH Right 01/15/2013  Procedure: INSERTION OF MESH;  Surgeon: Rogena Class, MD;  Location: [facility];  Service: General;  Laterality: Right;   INTRAOPERATIVE TRANSESOPHAGEAL ECHOCARDIOGRAM N/A 07/11/2013   Procedure: INTRAOPERATIVE TRANSESOPHAGEAL ECHOCARDIOGRAM;  Surgeon: Gardenia Jump, MD;  Location: [facility] OR;  Service: Open Heart Surgery;  Laterality: N/A;   LEFT AND RIGHT HEART CATHETERIZATION WITH CORONARY ANGIOGRAM N/A 06/06/2013   Procedure: LEFT AND RIGHT HEART CATHETERIZATION WITH CORONARY ANGIOGRAM;  Surgeon: Darlis Eisenmenger, MD;  Location: [facility] CATH  LAB;  Service: Cardiovascular;  Laterality: N/A;   LITHOTRIPSY     LOOP RECORDER IMPLANT  04/15/2013   MDT LinQ implanred by Dr Carolynne Citron for cryptogenic stroke   LOOP RECORDER IMPLANT N/A 04/14/2013   Procedure: LOOP RECORDER IMPLANT;  Surgeon: Tammie Fall, MD;  Location: [facility] CATH LAB;  Service: Cardiovascular;  Laterality: N/A;   LOOP RECORDER REMOVAL N/A 11/13/2018   Procedure: LOOP RECORDER REMOVAL;  Surgeon: Tammie Fall, MD;  [facility];  Service: Cardiovascular;  Laterality: N/A;   MOUTH SURGERY     PACEMAKER IMPLANT N/A 12/09/2018   Procedure: PACEMAKER IMPLANT;  Surgeon: Tammie Fall, MD;  [facility];  Service: Cardiovascular;  Laterality: N/A;   PERIPHERAL VASCULAR INTERVENTION Left 02/03/2021   Procedure: PERIPHERAL VASCULAR INTERVENTION;  Surgeon: Young Hensen, MD;  [facility];  Service: Cardiovascular;  Laterality: Left;  sfa    Family History  Problem Relation Age of Onset   Diabetes Mother    Heart attack Mother    Heart attack Father    Hypertension Father    Diabetes Unknown    Hypertension Unknown    Lung cancer Unknown    Cancer Sister        breast   Stroke Neg Hx     SOCIAL HISTORY: Social History   Socioeconomic History   Marital status: Married    Spouse name: Not on file   Number of children: Not on file   Years of education: Not on file   Highest education level: Not on file  Occupational History   Not on file  Tobacco Use   Smoking status: Former    Current packs/day: 0.00    Average packs/day: 1 pack/day for 30.0 years (30.0 ttl pk-yrs)    Types: Cigarettes    Start date: 08/30/1979    Quit date: 08/29/2009    Years since quitting: 13.9   Smokeless tobacco: Never  Vaping Use   Vaping status: Every Day  Substance and Sexual Activity   Alcohol use: No   Drug use: No   Sexual activity: Not Currently  Other Topics Concern   Not on file  Social History Narrative   He resides in  [place] with his wife and son. 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  No    grandchildren. He smokes half a pack per day intermittently for the last 40    years. He drinks a glass of homemade wine mixed with diet Coke one time per    week. He denies any drugs, herbal medication, diet, or exercise program.         Social Drivers of Health   Financial Resource Strain: Not on file  Food Insecurity: Patient Declined (09/27/2022)   Hunger Vital Sign    Worried About Running Out of Food in the Last Year: Patient declined    Ran Out  of Food in the Last Year: Patient declined  Transportation Needs: No Transportation Needs (09/27/2022)   PRAPARE - Administrator, Civil Service (Medical): No    Lack of Transportation (Non-Medical): No  Physical Activity: Not on file  Stress: Not on file  Social Connections: Not on file  Intimate Partner Violence: Patient Declined (09/27/2022)   Humiliation, Afraid, Rape, and Kick questionnaire    Fear of Current or Ex-Partner: Patient declined    Emotionally Abused: Patient declined    Physically Abused: Patient declined    Sexually Abused: Patient declined    Allergies  Allergen Reactions   Sudafed [Pseudoephedrine Hcl] Palpitations   Tizanidine     Other Reaction(s): drowsy   Cardizem [Diltiazem Hcl] Rash   Sudafed [Pseudoephedrine] Palpitations    Current Outpatient Medications  Medication Sig Dispense Refill   acetaminophen  (TYLENOL ) 500 MG tablet Take 500 mg by mouth every 6 (six) hours as needed for mild pain.      aspirin  EC 81 MG EC tablet Take 1 tablet (81 mg total) by mouth daily.     B Complex Vitamins (VITAMIN B COMPLEX) TABS daily at 6 (six) AM.     Carboxymethylcellul-Glycerin (CLEAR EYES FOR DRY EYES OP) Place 1 drop into both eyes daily.     Emollient (MEDERMA AG FACE EX) Apply 1 application topically daily as needed (on face for skin cancer).     famotidine  (PEPCID ) 20 MG tablet Take  20 mg by mouth 2 (two) times daily.     ferrous sulfate  325 (65 FE) MG tablet Take 325 mg by mouth daily with breakfast.     metoprolol  tartrate (LOPRESSOR ) 25 MG tablet Take 1 tablet by mouth twice daily 180 tablet 0   polyethylene glycol (MIRALAX  / GLYCOLAX ) 17 g packet Take 17 g by mouth daily as needed (constipation.).     rosuvastatin  (CRESTOR ) 10 MG tablet Take 10 mg by mouth every evening.      tamsulosin  (FLOMAX ) 0.4 MG CAPS capsule Take 0.4 mg by mouth every evening.      warfarin (COUMADIN ) 5 MG tablet TAKE 1/2 TO 1 (ONE-HALF TO ONE) TABLET BY MOUTH ONCE DAILY 60 tablet 0   No current facility-administered medications for this visit.    REVIEW OF SYSTEMS:  [X]  denotes positive finding, [ ]  denotes negative finding Cardiac  Comments:  Chest pain or chest pressure:    Shortness of breath upon exertion:    Short of breath when lying flat:    Irregular heart rhythm:        Vascular    Pain in calf, thigh, or hip brought on by ambulation:    Pain in feet at night that wakes you up from your sleep:     Blood clot in your veins:    Leg swelling:         Pulmonary    Oxygen at home:    Productive cough:     Wheezing:         Neurologic    Sudden weakness in arms or legs:     Sudden numbness in arms or legs:     Sudden onset of difficulty speaking or slurred speech:    Temporary loss of vision in one eye:     Problems with dizziness:         Gastrointestinal    Blood in stool:     Vomited blood:         Genitourinary    Burning when  urinating:     Blood in urine:        Psychiatric    Major depression:         Hematologic    Bleeding problems:    Problems with blood clotting too easily:        Skin    Rashes or ulcers:        Constitutional    Fever or chills:      PHYSICAL EXAM: There were no vitals filed for this visit.   GENERAL: The patient is a well-nourished male, in no acute distress. The vital signs are documented above. CARDIAC: There is a  regular rate and rhythm.  VASCULAR:  Palpable femoral pulses bilaterally No palpable right pedal pulses but no tissue loss and foot warm Left PT palpable  DATA:   ABI today 1.0 right biphasic and 1.16 left triphasic   Left leg arterial duplex today shows distal stent with a velocity of 373 suggesting >50% stenosis  Assessment/Plan:  89 year old male with multiple medical comorbidities that presents for 1 year interval follow-up after previous left leg intervention.  This included left SFA and above-knee popliteal angioplasty with stenting on 02/03/2021 for occlusion at [facility] for disabling claudication.  Duplex does show concern for greater than 50% stenosis in the distal left SFA stent.  That being said he has no lower extremity complaints and has a palpable pedal pulse with normal ABIs.  We discussed intervention versus short interval follow-up and ultimately will pursue short interval follow-up for now.  I will see him in 3 months with left leg arterial duplex and ABIs.  If he develops claudication or has ongoing increase in his stenosis or drop in ABIs we will pursue intervention as we discussed.  Young Hensen, MD [facility] [place] Office: [phone]

## 2023-08-02 ENCOUNTER — Other Ambulatory Visit: Payer: Self-pay | Admitting: *Deleted

## 2023-08-02 DIAGNOSIS — I70213 Atherosclerosis of native arteries of extremities with intermittent claudication, bilateral legs: Secondary | ICD-10-CM

## 2023-08-09 NOTE — Progress Notes (Signed)
 Remote pacemaker transmission.

## 2023-08-09 NOTE — Addendum Note (Signed)
 Addended by: Lott Rouleau A on: 08/09/2023 02:48 PM   Modules accepted: Orders

## 2023-08-17 DIAGNOSIS — G8929 Other chronic pain: Secondary | ICD-10-CM | POA: Diagnosis not present

## 2023-08-20 ENCOUNTER — Telehealth: Payer: Self-pay | Admitting: Internal Medicine

## 2023-08-20 ENCOUNTER — Telehealth: Payer: Self-pay

## 2023-08-20 NOTE — Telephone Encounter (Signed)
 Received a call from patient's daughter indicating that the patient has a mild nose bleed started this morning.  I recommended a nasal spray to moisten nares and that I would f/u Tuesday.  I see him in [facility] 6/18.

## 2023-08-20 NOTE — Telephone Encounter (Signed)
 Daughter says patient is having a nose bleed. he has not taken Coumadin  today. He takes it at night.

## 2023-08-21 ENCOUNTER — Telehealth: Payer: Self-pay

## 2023-08-21 NOTE — Telephone Encounter (Signed)
 I spoke to patient who informed me that nose bleed has stopped and he is much better today.  I will check INR 6/18 in [place].

## 2023-08-22 ENCOUNTER — Ambulatory Visit: Attending: Internal Medicine

## 2023-08-22 DIAGNOSIS — I4891 Unspecified atrial fibrillation: Secondary | ICD-10-CM

## 2023-08-22 DIAGNOSIS — I824Y9 Acute embolism and thrombosis of unspecified deep veins of unspecified proximal lower extremity: Secondary | ICD-10-CM

## 2023-08-22 DIAGNOSIS — D6859 Other primary thrombophilia: Secondary | ICD-10-CM | POA: Diagnosis not present

## 2023-08-22 DIAGNOSIS — Z5181 Encounter for therapeutic drug level monitoring: Secondary | ICD-10-CM | POA: Diagnosis not present

## 2023-08-22 DIAGNOSIS — Z953 Presence of xenogenic heart valve: Secondary | ICD-10-CM

## 2023-08-22 LAB — POCT INR: INR: 3.8 — AB (ref 2.0–3.0)

## 2023-08-22 NOTE — Telephone Encounter (Signed)
 done

## 2023-08-22 NOTE — Patient Instructions (Signed)
 Hold tonight only then Continue 1/2 tablet daily, EXCEPT 1 TABLET EVERY WEDNESDAY AND FRIDAY.  Recheck INR in 6 weeks.   Coumadin  Clinic [phone].

## 2023-08-28 ENCOUNTER — Other Ambulatory Visit: Payer: Self-pay | Admitting: Internal Medicine

## 2023-08-28 DIAGNOSIS — I4891 Unspecified atrial fibrillation: Secondary | ICD-10-CM

## 2023-09-25 ENCOUNTER — Ambulatory Visit: Payer: Medicare Other

## 2023-09-25 DIAGNOSIS — I495 Sick sinus syndrome: Secondary | ICD-10-CM | POA: Diagnosis not present

## 2023-09-28 LAB — CUP PACEART REMOTE DEVICE CHECK
Battery Remaining Longevity: 102 mo
Battery Remaining Percentage: 100 %
Brady Statistic RA Percent Paced: 99 %
Brady Statistic RV Percent Paced: 98 %
Date Time Interrogation Session: 20250725071200
Implantable Lead Connection Status: 753985
Implantable Lead Connection Status: 753985
Implantable Lead Implant Date: 20201005
Implantable Lead Implant Date: 20201005
Implantable Lead Location: 753859
Implantable Lead Location: 753860
Implantable Lead Model: 7742
Implantable Lead Model: 7841
Implantable Lead Serial Number: 1017357
Implantable Lead Serial Number: 1068132
Implantable Pulse Generator Implant Date: 20201005
Lead Channel Impedance Value: 528 Ohm
Lead Channel Impedance Value: 595 Ohm
Lead Channel Pacing Threshold Amplitude: 1.4 V
Lead Channel Pacing Threshold Pulse Width: 0.4 ms
Lead Channel Setting Pacing Amplitude: 2.3 V
Lead Channel Setting Pacing Amplitude: 2.6 V
Lead Channel Setting Pacing Pulse Width: 0.4 ms
Lead Channel Setting Sensing Sensitivity: 3 mV
Pulse Gen Serial Number: 890709
Zone Setting Status: 755011

## 2023-10-01 ENCOUNTER — Ambulatory Visit: Payer: Self-pay | Admitting: Internal Medicine

## 2023-10-03 ENCOUNTER — Ambulatory Visit: Attending: Internal Medicine

## 2023-10-03 DIAGNOSIS — Z5181 Encounter for therapeutic drug level monitoring: Secondary | ICD-10-CM | POA: Diagnosis not present

## 2023-10-03 DIAGNOSIS — Z953 Presence of xenogenic heart valve: Secondary | ICD-10-CM

## 2023-10-03 DIAGNOSIS — D6859 Other primary thrombophilia: Secondary | ICD-10-CM | POA: Diagnosis not present

## 2023-10-03 DIAGNOSIS — I824Y9 Acute embolism and thrombosis of unspecified deep veins of unspecified proximal lower extremity: Secondary | ICD-10-CM | POA: Diagnosis not present

## 2023-10-03 DIAGNOSIS — I4891 Unspecified atrial fibrillation: Secondary | ICD-10-CM | POA: Diagnosis not present

## 2023-10-03 LAB — POCT INR: INR: 3.8 — AB (ref 2.0–3.0)

## 2023-10-03 NOTE — Patient Instructions (Signed)
 Hold tonight only then Decrease to 1/2 tablet daily, EXCEPT 1 TABLET EVERY WEDNESDAY.  Recheck INR in 6 weeks.   Coumadin  Clinic [phone].

## 2023-10-24 ENCOUNTER — Other Ambulatory Visit: Payer: Self-pay | Admitting: Cardiology

## 2023-11-13 ENCOUNTER — Encounter: Payer: Self-pay | Admitting: Vascular Surgery

## 2023-11-13 ENCOUNTER — Ambulatory Visit: Attending: Vascular Surgery | Admitting: Vascular Surgery

## 2023-11-13 ENCOUNTER — Ambulatory Visit (HOSPITAL_COMMUNITY)
Admission: RE | Admit: 2023-11-13 | Discharge: 2023-11-13 | Discharge status: Home / Self Care | Source: Ambulatory Visit | Attending: Vascular Surgery

## 2023-11-13 ENCOUNTER — Ambulatory Visit (HOSPITAL_BASED_OUTPATIENT_CLINIC_OR_DEPARTMENT_OTHER)
Admission: RE | Admit: 2023-11-13 | Discharge: 2023-11-13 | Disposition: A | Discharge status: Home / Self Care | Source: Ambulatory Visit | Attending: Vascular Surgery | Admitting: Vascular Surgery

## 2023-11-13 VITALS — BP 172/92 | HR 60 | Temp 98.2°F | Ht 75.0 in | Wt 183.0 lb

## 2023-11-13 DIAGNOSIS — I70213 Atherosclerosis of native arteries of extremities with intermittent claudication, bilateral legs: Secondary | ICD-10-CM

## 2023-11-13 LAB — VAS US ABI WITH/WO TBI: Right ABI: 0.93

## 2023-11-13 NOTE — Progress Notes (Signed)
 Patient name: Sean Brock MRN: [id number] DOB: 1934-05-23 Sex: male  REASON FOR CONSULT: 78-month follow-up  HPI: Sean Brock is a 89 y.o. male, presents for 66-month follow-up of his PAD.  He previous underwent left SFA above-knee popliteal stenting for disabling claudication on 02/03/2021.  We have been following a over 50% stenosis in the distal left leg stent.  Today reports he is overall doing well.  Walking with a cane.  States he is about to celebrate his anniversary in [place].  Past Medical History:  Diagnosis Date   AORTIC STENOSIS    a. Echo (05/26/13):  Mod LVH, vigorous LVF, EF 65-70%, no RWMA, Gr 1 DD, severe AS (mean 40 mmHg) => s/p bioprosthetic AVR 07/2013   Arthritis    BACK   Atrial fib/flutter, transient (HCC)    CAD (coronary artery disease)    a. Lexiscan  myoview  (5/13) with EF 70%, no ischemia or infarction;   b. LHC (06/2013):  Dist LM 30%, ostial LAD 40-50%, prox LAD 30%, ostial D1 80-90%, prox CFX 40%, RCA occluded with L-R collats => CABG (S-PDA at time of AVR)   CVA (cerebral infarction)    a. 3/14 right parietal infarct. Carotid US  3/14 showed no significant disease. He has an implanted loop recorder to look for atrial fibrillation.    DYSPEPSIA    ED (erectile dysfunction)    Former smoker    GERD (gastroesophageal reflux disease)    History of kidney stones    HYPERLIPIDEMIA    HYPERTENSION    Palpitations    a. Event monitor (5/13) with occasional PVCs, PACs but no significant arrhythmia.    Peripheral vascular disease (HCC)    S/P aortic valve replacement with bioprosthetic valve + CABG x1 07/11/2013   23 mm [facility] Ease bovine pericardial tissue valve;  Echo (08/2013):  EF 55-60%, no RWMA, AVR ok, Asc Ao 40 mm (mildly dilated), mild LAE, mild RVE, lipomatous hypertrophy, mod TR, PASP 36 mmHg   S/P CABG x 1 07/11/2013   SVG to PDA with [facility] via right thigh   Sinus bradycardia    a. 2013 - HR 40's in the office. BB stopped.   Trigeminal  neuropathy    Vertigo     Past Surgical History:  Procedure Laterality Date   A-FLUTTER ABLATION N/A 11/13/2018   Procedure: A-FLUTTER ABLATION;  Surgeon: Waddell Danelle ORN, MD;  [facility];  Service: Cardiovascular;  Laterality: N/A;   ABDOMINAL AORTOGRAM W/LOWER EXTREMITY N/A 02/03/2021   Procedure: ABDOMINAL AORTOGRAM W/LOWER EXTREMITY;  Surgeon: Gretta Lonni PARAS, MD;  [facility];  Service: Cardiovascular;  Laterality: N/A;   AORTIC VALVE REPLACEMENT N/A 07/11/2013   Procedure: AORTIC VALVE REPLACEMENT (AVR);  Surgeon: Sudie VEAR Laine, MD;  Location: [facility] OR;  Service: Open Heart Surgery;  Laterality: N/A;   CATARACT EXTRACTION     CORONARY ARTERY BYPASS GRAFT N/A 07/11/2013   Procedure: CORONARY ARTERY BYPASS GRAFTING (CABG) x1;  Surgeon: Sudie VEAR Laine, MD;  [facility];  Service: Open Heart Surgery;  Laterality: N/A;   INGUINAL HERNIA REPAIR Right 01/15/2013   Procedure: HERNIA REPAIR INGUINAL ADULT;  Surgeon: Vicenta DELENA Poli, MD;  Location: [facility];  Service: General;  Laterality: Right;   INSERTION OF MESH Right 01/15/2013   Procedure: INSERTION OF MESH;  Surgeon: Vicenta DELENA Poli, MD;  Location: [facility];  Service: General;  Laterality: Right;   INTRAOPERATIVE TRANSESOPHAGEAL ECHOCARDIOGRAM N/A 07/11/2013  Procedure: INTRAOPERATIVE TRANSESOPHAGEAL ECHOCARDIOGRAM;  Surgeon: Sudie VEAR Laine, MD;  Location: [facility] OR;  Service: Open Heart Surgery;  Laterality: N/A;   LEFT AND RIGHT HEART CATHETERIZATION WITH CORONARY ANGIOGRAM N/A 06/06/2013   Procedure: LEFT AND RIGHT HEART CATHETERIZATION WITH CORONARY ANGIOGRAM;  Surgeon: Ezra GORMAN Shuck, MD;  Location: [facility] CATH LAB;  Service: Cardiovascular;  Laterality: N/A;   LITHOTRIPSY     LOOP RECORDER IMPLANT  04/15/2013   MDT LinQ implanred by Dr Waddell for cryptogenic stroke   LOOP RECORDER IMPLANT N/A 04/14/2013   Procedure: LOOP RECORDER IMPLANT;  Surgeon: Danelle LELON Waddell,  MD;  Location: [facility] CATH LAB;  Service: Cardiovascular;  Laterality: N/A;   LOOP RECORDER REMOVAL N/A 11/13/2018   Procedure: LOOP RECORDER REMOVAL;  Surgeon: Waddell Danelle LELON, MD;  [facility];  Service: Cardiovascular;  Laterality: N/A;   MOUTH SURGERY     PACEMAKER IMPLANT N/A 12/09/2018   Procedure: PACEMAKER IMPLANT;  Surgeon: Waddell Danelle LELON, MD;  [facility];  Service: Cardiovascular;  Laterality: N/A;   PERIPHERAL VASCULAR INTERVENTION Left 02/03/2021   Procedure: PERIPHERAL VASCULAR INTERVENTION;  Surgeon: Gretta Lonni PARAS, MD;  [facility];  Service: Cardiovascular;  Laterality: Left;  sfa    Family History  Problem Relation Age of Onset   Diabetes Mother    Heart attack Mother    Heart attack Father    Hypertension Father    Diabetes Unknown    Hypertension Unknown    Lung cancer Unknown    Cancer Sister        breast   Stroke Neg Hx     SOCIAL HISTORY: Social History   Socioeconomic History   Marital status: Married    Spouse name: Not on file   Number of children: Not on file   Years of education: Not on file   Highest education level: Not on file  Occupational History   Not on file  Tobacco Use   Smoking status: Former    Current packs/day: 0.00    Average packs/day: 1 pack/day for 30.0 years (30.0 ttl pk-yrs)    Types: Cigarettes    Start date: 08/30/1979    Quit date: 08/29/2009    Years since quitting: 14.2   Smokeless tobacco: Never  Vaping Use   Vaping status: Never Used  Substance and Sexual Activity   Alcohol use: No   Drug use: No   Sexual activity: Not Currently  Other Topics Concern   Not on file  Social History Narrative   He resides in [place] with his wife and son. 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  No    grandchildren. He smokes half a pack per day intermittently for the last 40    years. He drinks a glass of homemade wine mixed with diet Coke  one time per    week. He denies any drugs, herbal medication, diet, or exercise program.         Social Drivers of Health   Financial Resource Strain: Not on file  Food Insecurity: Patient Declined (09/27/2022)   Hunger Vital Sign    Worried About Running Out of Food in the Last Year: Patient declined    Ran Out of Food in the Last Year: Patient declined  Transportation Needs: No Transportation Needs (09/27/2022)   PRAPARE - Administrator, Civil Service (Medical): No  Lack of Transportation (Non-Medical): No  Physical Activity: Not on file  Stress: Not on file  Social Connections: Not on file  Intimate Partner Violence: Patient Declined (09/27/2022)   Humiliation, Afraid, Rape, and Kick questionnaire    Fear of Current or Ex-Partner: Patient declined    Emotionally Abused: Patient declined    Physically Abused: Patient declined    Sexually Abused: Patient declined    Allergies  Allergen Reactions   Sudafed [Pseudoephedrine Hcl] Palpitations   Tizanidine     Other Reaction(s): drowsy   Cardizem [Diltiazem Hcl] Rash   Sudafed [Pseudoephedrine] Palpitations    Current Outpatient Medications  Medication Sig Dispense Refill   acetaminophen  (TYLENOL ) 500 MG tablet Take 500 mg by mouth every 6 (six) hours as needed for mild pain.      aspirin  EC 81 MG EC tablet Take 1 tablet (81 mg total) by mouth daily.     B Complex Vitamins (VITAMIN B COMPLEX) TABS daily at 6 (six) AM.     Carboxymethylcellul-Glycerin (CLEAR EYES FOR DRY EYES OP) Place 1 drop into both eyes daily.     Emollient (MEDERMA AG FACE EX) Apply 1 application topically daily as needed (on face for skin cancer).     famotidine  (PEPCID ) 20 MG tablet Take 20 mg by mouth 2 (two) times daily.     ferrous sulfate  325 (65 FE) MG tablet Take 325 mg by mouth daily with breakfast.     metoprolol  tartrate (LOPRESSOR ) 25 MG tablet Take 1 tablet by mouth twice daily 180 tablet 3   polyethylene glycol (MIRALAX  /  GLYCOLAX ) 17 g packet Take 17 g by mouth daily as needed (constipation.).     rosuvastatin  (CRESTOR ) 10 MG tablet Take 10 mg by mouth every evening.      tamsulosin  (FLOMAX ) 0.4 MG CAPS capsule Take 0.4 mg by mouth every evening.      traMADol  (ULTRAM ) 50 MG tablet Take 50 mg by mouth every 8 (eight) hours as needed.     warfarin (COUMADIN ) 5 MG tablet TAKE 1/2 TO 1 (ONE-HALF TO ONE) TABLET BY MOUTH ONCE DAILY 60 tablet 3   No current facility-administered medications for this visit.    REVIEW OF SYSTEMS:  [X]  denotes positive finding, [ ]  denotes negative finding Cardiac  Comments:  Chest pain or chest pressure:    Shortness of breath upon exertion:    Short of breath when lying flat:    Irregular heart rhythm:        Vascular    Pain in calf, thigh, or hip brought on by ambulation:    Pain in feet at night that wakes you up from your sleep:     Blood clot in your veins:    Leg swelling:         Pulmonary    Oxygen at home:    Productive cough:     Wheezing:         Neurologic    Sudden weakness in arms or legs:     Sudden numbness in arms or legs:     Sudden onset of difficulty speaking or slurred speech:    Temporary loss of vision in one eye:     Problems with dizziness:         Gastrointestinal    Blood in stool:     Vomited blood:         Genitourinary    Burning when urinating:     Blood in urine:  Psychiatric    Major depression:         Hematologic    Bleeding problems:    Problems with blood clotting too easily:        Skin    Rashes or ulcers:        Constitutional    Fever or chills:      PHYSICAL EXAM: Vitals:   11/13/23 1033  BP: (!) 172/92  Pulse: 60  Temp: 98.2 F (36.8 C)  SpO2: 98%  Weight: 183 lb (83 kg)  Height: 6' 3 (1.905 m)    GENERAL: The patient is a well-nourished male, in no acute distress. The vital signs are documented above. CARDIAC: There is a regular rate and rhythm.  VASCULAR:  Bilateral femoral pulses  palpable Bilateral DP pulses palpable PULMONARY: No respiratory distress. ABDOMEN: Soft and non-tender. MUSCULOSKELETAL: There are no major deformities or cyanosis. NEUROLOGIC: No focal weakness or paresthesias are detected. SKIN: There are no ulcers or rashes noted. PSYCHIATRIC: The patient has a normal affect.  DATA:   ABIs today are 0.93 on the right monophasic and 1.53 on the left biphasic  Lower extremity duplex shows high-grade stenosis of the distal left SFA above-knee popliteal stent with a velocity of 373  Assessment/Plan:  89 y.o. male, presents for 68-month follow-up of his PAD.  He previous underwent left SFA above-knee popliteal stenting for disabling claudication on 02/03/2021.    I discussed that stents in his left leg remain patent.  He does have a persistent high-grade stenosis in the distal left SFA above-knee popliteal stent with velocity 373.  I have recommended an angiogram with lower extremity arteriogram with a focus on the left leg.  Discussed this is to maintain primary patency.  Ultimately we could pursue balloon angioplasty with a drug-coated balloon versus additional stenting at that time in the SFApopliteal region pending angiogram findings.  Will get scheduled in the Cath Lab.  Will need to hold his Coumadin .  Lovenox  bridge okay.  He wants to schedule after his anniversary trip which is fine   Lonni DOROTHA Gaskins, MD [facility] [place] Office: [phone]

## 2023-11-14 ENCOUNTER — Telehealth: Payer: Self-pay

## 2023-11-14 ENCOUNTER — Other Ambulatory Visit: Payer: Self-pay

## 2023-11-14 ENCOUNTER — Ambulatory Visit: Attending: Internal Medicine

## 2023-11-14 DIAGNOSIS — I824Y9 Acute embolism and thrombosis of unspecified deep veins of unspecified proximal lower extremity: Secondary | ICD-10-CM | POA: Diagnosis not present

## 2023-11-14 DIAGNOSIS — D6859 Other primary thrombophilia: Secondary | ICD-10-CM

## 2023-11-14 DIAGNOSIS — Z953 Presence of xenogenic heart valve: Secondary | ICD-10-CM

## 2023-11-14 DIAGNOSIS — Z5181 Encounter for therapeutic drug level monitoring: Secondary | ICD-10-CM | POA: Diagnosis not present

## 2023-11-14 DIAGNOSIS — I4891 Unspecified atrial fibrillation: Secondary | ICD-10-CM | POA: Diagnosis not present

## 2023-11-14 DIAGNOSIS — I70213 Atherosclerosis of native arteries of extremities with intermittent claudication, bilateral legs: Secondary | ICD-10-CM

## 2023-11-14 LAB — POCT INR: INR: 2.8 (ref 2.0–3.0)

## 2023-11-14 NOTE — Telephone Encounter (Signed)
 Spoke to daughter, Jeoffrey, who stated she will ask her dad to call [facility] for surgery scheduling.

## 2023-11-14 NOTE — Telephone Encounter (Signed)
 Attempted call for scheduling.  This person is not accepting calls at this time.

## 2023-11-14 NOTE — Patient Instructions (Signed)
 Continue 1/2 tablet daily, EXCEPT 1 TABLET EVERY WEDNESDAY.  Recheck INR in 6 weeks.   Coumadin  Clinic [phone].

## 2023-11-15 ENCOUNTER — Telehealth: Payer: Self-pay

## 2023-11-15 NOTE — Telephone Encounter (Signed)
 Lpmtcb to reschedule [facility]  clinic visit to 10/1

## 2023-12-05 ENCOUNTER — Ambulatory Visit: Attending: Cardiology

## 2023-12-05 DIAGNOSIS — Z953 Presence of xenogenic heart valve: Secondary | ICD-10-CM

## 2023-12-05 DIAGNOSIS — Z5181 Encounter for therapeutic drug level monitoring: Secondary | ICD-10-CM | POA: Diagnosis not present

## 2023-12-05 DIAGNOSIS — D6859 Other primary thrombophilia: Secondary | ICD-10-CM | POA: Diagnosis not present

## 2023-12-05 LAB — POCT INR: INR: 2.3 (ref 2.0–3.0)

## 2023-12-05 MED ORDER — ENOXAPARIN SODIUM 80 MG/0.8ML IJ SOSY: 80.0000 mg | PREFILLED_SYRINGE | Freq: Two times a day (BID) | INTRAMUSCULAR | 1 refills | Status: AC

## 2023-12-05 NOTE — Patient Instructions (Signed)
 Continue 1/2 tablet daily, EXCEPT 1 TABLET EVERY WEDNESDAY.  Recheck INR in 2 weeks.  Abdominal Aortogram 10/9 Holding Coumadin  10/4-8  Lovenox  [facility]  Clinic [phone].   10/3: Last dose of warfarin.  10/4: No warfarin or enoxaparin  (Lovenox ).  10/5: Inject enoxaparin  80mg  in the fatty abdominal tissue at least 2 inches from the belly button twice a day about 12 hours apart, 8am and 8pm rotate sites. No warfarin.  10/6: Inject enoxaparin  in the fatty tissue every 12 hours, 8am and 8pm. No warfarin.  10/7: Inject enoxaparin  in the fatty tissue every 12 hours, 8am and 8pm. No warfarin.  10/8: Inject enoxaparin  in the fatty tissue in the morning at 8 am (No PM dose). No warfarin.  10/9: Procedure Day - No enoxaparin  - Resume warfarin in the evening or as directed by doctor (take an extra half tablet with usual dose for 2 days then resume normal dose).  10/10: Resume enoxaparin  inject in the fatty tissue every 12 hours and take warfarin  10/11: Inject enoxaparin  in the fatty tissue every 12 hours and take warfarin  10/12: Inject enoxaparin  in the fatty tissue every 12 hours and take warfarin  10/13: Inject enoxaparin  in the fatty tissue every 12 hours and take warfarin  10/14: Inject enoxaparin  in the fatty tissue every 12 hours and take warfarin  10/15: warfarin appt to check INR.

## 2023-12-07 NOTE — Progress Notes (Signed)
 Remote PPM Transmission

## 2023-12-13 ENCOUNTER — Other Ambulatory Visit: Payer: Self-pay

## 2023-12-13 ENCOUNTER — Encounter (HOSPITAL_COMMUNITY)
Admission: RE | Disposition: A | Discharge status: Home / Self Care | Payer: Self-pay | Source: Home / Self Care | Attending: Vascular Surgery

## 2023-12-13 ENCOUNTER — Encounter (HOSPITAL_COMMUNITY): Payer: Self-pay | Admitting: Vascular Surgery

## 2023-12-13 ENCOUNTER — Ambulatory Visit (HOSPITAL_COMMUNITY)
Admission: RE | Admit: 2023-12-13 | Discharge: 2023-12-13 | Disposition: A | Discharge status: Home / Self Care | Attending: Vascular Surgery | Admitting: Vascular Surgery

## 2023-12-13 DIAGNOSIS — I70202 Unspecified atherosclerosis of native arteries of extremities, left leg: Secondary | ICD-10-CM

## 2023-12-13 DIAGNOSIS — Z7901 Long term (current) use of anticoagulants: Secondary | ICD-10-CM | POA: Insufficient documentation

## 2023-12-13 DIAGNOSIS — I70212 Atherosclerosis of native arteries of extremities with intermittent claudication, left leg: Secondary | ICD-10-CM | POA: Insufficient documentation

## 2023-12-13 DIAGNOSIS — Z79899 Other long term (current) drug therapy: Secondary | ICD-10-CM | POA: Diagnosis not present

## 2023-12-13 DIAGNOSIS — T82856A Stenosis of peripheral vascular stent, initial encounter: Secondary | ICD-10-CM | POA: Diagnosis not present

## 2023-12-13 DIAGNOSIS — I723 Aneurysm of iliac artery: Secondary | ICD-10-CM

## 2023-12-13 DIAGNOSIS — Y839 Surgical procedure, unspecified as the cause of abnormal reaction of the patient, or of later complication, without mention of misadventure at the time of the procedure: Secondary | ICD-10-CM | POA: Insufficient documentation

## 2023-12-13 DIAGNOSIS — Z87891 Personal history of nicotine dependence: Secondary | ICD-10-CM | POA: Insufficient documentation

## 2023-12-13 DIAGNOSIS — I70213 Atherosclerosis of native arteries of extremities with intermittent claudication, bilateral legs: Secondary | ICD-10-CM

## 2023-12-13 HISTORY — PX: LOWER EXTREMITY INTERVENTION: CATH118252

## 2023-12-13 HISTORY — PX: ABDOMINAL AORTOGRAM: CATH118222

## 2023-12-13 HISTORY — PX: LOWER EXTREMITY ANGIOGRAPHY: CATH118251

## 2023-12-13 LAB — POCT I-STAT, CHEM 8
BUN: 20 mg/dL (ref 8–23)
Calcium, Ion: 1.27 mmol/L (ref 1.15–1.40)
Chloride: 103 mmol/L (ref 98–111)
Creatinine, Ser: 0.9 mg/dL (ref 0.61–1.24)
Glucose, Bld: 108 mg/dL — ABNORMAL HIGH (ref 70–99)
HCT: 39 % (ref 39.0–52.0)
Hemoglobin: 13.3 g/dL (ref 13.0–17.0)
Potassium: 4 mmol/L (ref 3.5–5.1)
Sodium: 142 mmol/L (ref 135–145)
TCO2: 26 mmol/L (ref 22–32)

## 2023-12-13 LAB — PROTIME-INR
INR: 1.5 — ABNORMAL HIGH (ref 0.8–1.2)
Prothrombin Time: 18.6 s — ABNORMAL HIGH (ref 11.4–15.2)

## 2023-12-13 SURGERY — ABDOMINAL AORTOGRAM
Anesthesia: LOCAL

## 2023-12-13 MED ORDER — HEPARIN SODIUM (PORCINE) 1000 UNIT/ML IJ SOLN
INTRAMUSCULAR | Status: DC | PRN
  Administered 2023-12-13: 8000 [IU] via INTRAVENOUS

## 2023-12-13 MED ORDER — HYDRALAZINE HCL 20 MG/ML IJ SOLN
INTRAMUSCULAR | Status: DC | PRN
  Administered 2023-12-13: 10 mg via INTRAVENOUS

## 2023-12-13 MED ORDER — HEPARIN (PORCINE) IN NACL 1000-0.9 UT/500ML-% IV SOLN
INTRAVENOUS | Status: DC | PRN
  Administered 2023-12-13 (×2): 500 mL

## 2023-12-13 MED ORDER — HEPARIN SODIUM (PORCINE) 1000 UNIT/ML IJ SOLN
INTRAMUSCULAR | Status: AC
  Filled 2023-12-13: qty 10

## 2023-12-13 MED ORDER — IODIXANOL 320 MG/ML IV SOLN
INTRAVENOUS | Status: DC | PRN
  Administered 2023-12-13: 75 mL

## 2023-12-13 MED ORDER — LIDOCAINE HCL (PF) 1 % IJ SOLN
INTRAMUSCULAR | Status: DC | PRN
  Administered 2023-12-13: 10 mL via INTRADERMAL

## 2023-12-13 MED ORDER — LIDOCAINE HCL (PF) 1 % IJ SOLN
INTRAMUSCULAR | Status: AC
  Filled 2023-12-13: qty 30

## 2023-12-13 MED ORDER — SODIUM CHLORIDE 0.9 % IV SOLN: INTRAVENOUS | Status: DC

## 2023-12-13 SURGICAL SUPPLY — 17 items
CATH OMNI FLUSH 5F 65CM (CATHETERS) IMPLANT
CATH QUICKCROSS SUPP .035X90CM (MICROCATHETER) IMPLANT
CATH SHOCKWAVE E8 5X80 (CATHETERS) IMPLANT
DEVICE CLOSURE MYNXGRIP 6/7F (Vascular Products) IMPLANT
GLIDEWIRE ADV .035X260CM (WIRE) IMPLANT
KIT ENCORE 26 ADVANTAGE (KITS) IMPLANT
KIT MICROPUNCTURE NIT STIFF (SHEATH) IMPLANT
KIT SINGLE USE MANIFOLD (KITS) IMPLANT
KIT SYRINGE INJ CVI SPIKEX1 (MISCELLANEOUS) IMPLANT
PACK CARDIAC CATHETERIZATION (CUSTOM PROCEDURE TRAY) IMPLANT
SET ATX-X65L (MISCELLANEOUS) IMPLANT
SHEATH CATAPULT 6FR 45 (SHEATH) IMPLANT
SHEATH PINNACLE 5F 10CM (SHEATH) IMPLANT
SHEATH PINNACLE 6F 10CM (SHEATH) IMPLANT
SHEATH PROBE COVER 6X72 (BAG) IMPLANT
WIRE BENTSON .035X145CM (WIRE) IMPLANT
WIRE SPARTACORE .014X300CM (WIRE) IMPLANT

## 2023-12-13 NOTE — Progress Notes (Signed)
 Up and walked and tolerated well; right groin stable, no bleeding or hematoma

## 2023-12-13 NOTE — H&P (Signed)
 History and Physical Interval Note:  12/13/2023 8:00 AM  Sean Brock  has presented today for surgery, with the diagnosis of ischemia.  The various methods of treatment have been discussed with the patient and family. After consideration of risks, benefits and other options for treatment, the patient has consented to  Procedure(s): ABDOMINAL AORTOGRAM (N/A) Lower Extremity Angiography (N/A) LOWER EXTREMITY INTERVENTION (N/A) as a surgical intervention.  The patient's history has been reviewed, patient examined, no change in status, stable for surgery.  I have reviewed the patient's chart and labs.  Questions were answered to the patient's satisfaction.    Evaluate left leg stents for in-stent stenosis  Sean Brock     Patient name: Sean Brock         MRN: [id number]        DOB: 12/03/1934          Sex: male   REASON FOR CONSULT: 60-month follow-up   HPI: Sean Brock is a 89 y.o. male, presents for 17-month follow-up of his PAD.  He previous underwent left SFA above-knee popliteal stenting for disabling claudication on 02/03/2021.  We have been following a over 50% stenosis in the distal left leg stent.  Today reports he is overall doing well.  Walking with a cane.  States he is about to celebrate his anniversary in [place].       Past Medical History:  Diagnosis Date   AORTIC STENOSIS      a. Echo (05/26/13):  Mod LVH, vigorous LVF, EF 65-70%, no RWMA, Gr 1 DD, severe AS (mean 40 mmHg) => s/p bioprosthetic AVR 07/2013   Arthritis      BACK   Atrial fib/flutter, transient (HCC)     CAD (coronary artery disease)      a. Lexiscan  myoview  (5/13) with EF 70%, no ischemia or infarction;   b. LHC (06/2013):  Dist LM 30%, ostial LAD 40-50%, prox LAD 30%, ostial D1 80-90%, prox CFX 40%, RCA occluded with L-R collats => CABG (S-PDA at time of AVR)   CVA (cerebral infarction)      a. 3/14 right parietal infarct. Carotid US  3/14 showed no significant disease. He has an implanted  loop recorder to look for atrial fibrillation.    DYSPEPSIA     ED (erectile dysfunction)     Former smoker     GERD (gastroesophageal reflux disease)     History of kidney stones     HYPERLIPIDEMIA     HYPERTENSION     Palpitations      a. Event monitor (5/13) with occasional PVCs, PACs but no significant arrhythmia.    Peripheral vascular disease (HCC)     S/P aortic valve replacement with bioprosthetic valve + CABG x1 07/11/2013    23 mm [facility] Ease bovine pericardial tissue valve;  Echo (08/2013):  EF 55-60%, no RWMA, AVR ok, Asc Ao 40 mm (mildly dilated), mild LAE, mild RVE, lipomatous hypertrophy, mod TR, PASP 36 mmHg   S/P CABG x 1 07/11/2013    SVG to PDA with [facility] via right thigh   Sinus bradycardia      a. 2013 - HR 40's in the office. BB stopped.   Trigeminal neuropathy     Vertigo                 Past Surgical History:  Procedure Laterality Date   A-FLUTTER ABLATION N/A 11/13/2018    Procedure: A-FLUTTER ABLATION;  Surgeon: Waddell Danelle ORN, MD;  Location: [facility];  Service: Cardiovascular;  Laterality: N/A;   ABDOMINAL AORTOGRAM W/LOWER EXTREMITY N/A 02/03/2021    Procedure: ABDOMINAL AORTOGRAM W/LOWER EXTREMITY;  Surgeon: Gretta Sean PARAS, MD;  Location: [facility];  Service: Cardiovascular;  Laterality: N/A;   AORTIC VALVE REPLACEMENT N/A 07/11/2013    Procedure: AORTIC VALVE REPLACEMENT (AVR);  Surgeon: Sudie VEAR Laine, MD;  Location: [facility] OR;  Service: Open Heart Surgery;  Laterality: N/A;   CATARACT EXTRACTION       CORONARY ARTERY BYPASS GRAFT N/A 07/11/2013    Procedure: CORONARY ARTERY BYPASS GRAFTING (CABG) x1;  Surgeon: Sudie VEAR Laine, MD;  Location: [facility] OR;  Service: Open Heart Surgery;  Laterality: N/A;   INGUINAL HERNIA REPAIR Right 01/15/2013    Procedure: HERNIA REPAIR INGUINAL ADULT;  Surgeon: Vicenta DELENA Poli, MD;  Location: [facility];  Service: General;  Laterality: Right;   INSERTION OF MESH Right 01/15/2013     Procedure: INSERTION OF MESH;  Surgeon: Vicenta DELENA Poli, MD;  Location: [facility];  Service: General;  Laterality: Right;   INTRAOPERATIVE TRANSESOPHAGEAL ECHOCARDIOGRAM N/A 07/11/2013    Procedure: INTRAOPERATIVE TRANSESOPHAGEAL ECHOCARDIOGRAM;  Surgeon: Sudie VEAR Laine, MD;  Location: [facility] OR;  Service: Open Heart Surgery;  Laterality: N/A;   LEFT AND RIGHT HEART CATHETERIZATION WITH CORONARY ANGIOGRAM N/A 06/06/2013    Procedure: LEFT AND RIGHT HEART CATHETERIZATION WITH CORONARY ANGIOGRAM;  Surgeon: Ezra GORMAN Shuck, MD;  Location: [facility] CATH LAB;  Service: Cardiovascular;  Laterality: N/A;   LITHOTRIPSY       LOOP RECORDER IMPLANT   04/15/2013    MDT LinQ implanred by Dr Waddell for cryptogenic stroke   LOOP RECORDER IMPLANT N/A 04/14/2013    Procedure: LOOP RECORDER IMPLANT;  Surgeon: Danelle LELON Waddell, MD;  Location: [facility] CATH LAB;  Service: Cardiovascular;  Laterality: N/A;   LOOP RECORDER REMOVAL N/A 11/13/2018    Procedure: LOOP RECORDER REMOVAL;  Surgeon: Waddell Danelle LELON, MD;  Location: [facility];  Service: Cardiovascular;  Laterality: N/A;   MOUTH SURGERY       PACEMAKER IMPLANT N/A 12/09/2018    Procedure: PACEMAKER IMPLANT;  Surgeon: Waddell Danelle LELON, MD;  Location: [facility];  Service: Cardiovascular;  Laterality: N/A;   PERIPHERAL VASCULAR INTERVENTION Left 02/03/2021    Procedure: PERIPHERAL VASCULAR INTERVENTION;  Surgeon: Gretta Sean PARAS, MD;  Location: [facility];  Service: Cardiovascular;  Laterality: Left;  sfa               Family History  Problem Relation Age of Onset   Diabetes Mother     Heart attack Mother     Heart attack Father     Hypertension Father     Diabetes Unknown     Hypertension Unknown     Lung cancer Unknown     Cancer Sister          breast   Stroke Neg Hx            SOCIAL HISTORY: Social History         Socioeconomic History   Marital status: Married      Spouse name: Not on file   Number of children:  Not on file   Years of education: Not on file   Highest education level: Not on file  Occupational History   Not on file  Tobacco Use   Smoking status: Former      Current packs/day: 0.00  Average packs/day: 1 pack/day for 30.0 years (30.0 ttl pk-yrs)      Types: Cigarettes      Start date: 08/30/1979      Quit date: 08/29/2009      Years since quitting: 14.2   Smokeless tobacco: Never  Vaping Use   Vaping status: Never Used  Substance and Sexual Activity   Alcohol use: No   Drug use: No   Sexual activity: Not Currently  Other Topics Concern   Not on file  Social History Narrative    He resides in [place] with his wife and son. 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  No     grandchildren. He smokes half a pack per day intermittently for the last 40     years. He drinks a glass of homemade wine mixed with diet Coke one time per     week. He denies any drugs, herbal medication, diet, or exercise program.              Social Drivers of Health        Financial Resource Strain: Not on file  Food Insecurity: Patient Declined (09/27/2022)    Hunger Vital Sign     Worried About Running Out of Food in the Last Year: Patient declined     Ran Out of Food in the Last Year: Patient declined  Transportation Needs: No Transportation Needs (09/27/2022)    PRAPARE - Therapist, art (Medical): No     Lack of Transportation (Non-Medical): No  Physical Activity: Not on file  Stress: Not on file  Social Connections: Not on file  Intimate Partner Violence: Patient Declined (09/27/2022)    Humiliation, Afraid, Rape, and Kick questionnaire     Fear of Current or Ex-Partner: Patient declined     Emotionally Abused: Patient declined     Physically Abused: Patient declined     Sexually Abused: Patient declined      Allergies       Allergies  Allergen Reactions   Sudafed [Pseudoephedrine Hcl] Palpitations   Tizanidine         Other Reaction(s): drowsy   Cardizem [Diltiazem Hcl] Rash   Sudafed [Pseudoephedrine] Palpitations              Current Outpatient Medications  Medication Sig Dispense Refill   acetaminophen  (TYLENOL ) 500 MG tablet Take 500 mg by mouth every 6 (six) hours as needed for mild pain.        aspirin  EC 81 MG EC tablet Take 1 tablet (81 mg total) by mouth daily.       B Complex Vitamins (VITAMIN B COMPLEX) TABS daily at 6 (six) AM.       Carboxymethylcellul-Glycerin (CLEAR EYES FOR DRY EYES OP) Place 1 drop into both eyes daily.       Emollient (MEDERMA AG FACE EX) Apply 1 application topically daily as needed (on face for skin cancer).       famotidine  (PEPCID ) 20 MG tablet Take 20 mg by mouth 2 (two) times daily.       ferrous sulfate  325 (65 FE) MG tablet Take 325 mg by mouth daily with breakfast.       metoprolol  tartrate (LOPRESSOR ) 25 MG tablet Take 1 tablet by mouth twice daily 180 tablet 3   polyethylene glycol (MIRALAX  / GLYCOLAX ) 17 g packet Take 17 g by mouth daily as needed (constipation.).  rosuvastatin  (CRESTOR ) 10 MG tablet Take 10 mg by mouth every evening.        tamsulosin  (FLOMAX ) 0.4 MG CAPS capsule Take 0.4 mg by mouth every evening.        traMADol  (ULTRAM ) 50 MG tablet Take 50 mg by mouth every 8 (eight) hours as needed.       warfarin (COUMADIN ) 5 MG tablet TAKE 1/2 TO 1 (ONE-HALF TO ONE) TABLET BY MOUTH ONCE DAILY 60 tablet 3      No current facility-administered medications for this visit.        REVIEW OF SYSTEMS:  [X]  denotes positive finding, [ ]  denotes negative finding Cardiac   Comments:  Chest pain or chest pressure:      Shortness of breath upon exertion:      Short of breath when lying flat:      Irregular heart rhythm:             Vascular      Pain in calf, thigh, or hip brought on by ambulation:      Pain in feet at night that wakes you up from your sleep:       Blood clot in your veins:      Leg swelling:              Pulmonary       Oxygen at home:      Productive cough:       Wheezing:              Neurologic      Sudden weakness in arms or legs:       Sudden numbness in arms or legs:       Sudden onset of difficulty speaking or slurred speech:      Temporary loss of vision in one eye:       Problems with dizziness:              Gastrointestinal      Blood in stool:       Vomited blood:              Genitourinary      Burning when urinating:       Blood in urine:             Psychiatric      Major depression:              Hematologic      Bleeding problems:      Problems with blood clotting too easily:             Skin      Rashes or ulcers:             Constitutional      Fever or chills:          PHYSICAL EXAM:    Vitals:    11/13/23 1033  BP: (!) 172/92  Pulse: 60  Temp: 98.2 F (36.8 C)  SpO2: 98%  Weight: 183 lb (83 kg)  Height: 6' 3 (1.905 m)      GENERAL: The patient is a well-nourished male, in no acute distress. The vital signs are documented above. CARDIAC: There is a regular rate and rhythm.  VASCULAR:  Bilateral femoral pulses palpable Bilateral DP pulses palpable PULMONARY: No respiratory distress. ABDOMEN: Soft and non-tender. MUSCULOSKELETAL: There are no major deformities or cyanosis. NEUROLOGIC: No focal weakness or paresthesias are detected. SKIN: There are no ulcers or rashes noted.  PSYCHIATRIC: The patient has a normal affect.   DATA:    ABIs today are 0.93 on the right monophasic and 1.53 on the left biphasic   Lower extremity duplex shows high-grade stenosis of the distal left SFA above-knee popliteal stent with a velocity of 373   Assessment/Plan:   89 y.o. male, presents for 35-month follow-up of his PAD.  He previous underwent left SFA above-knee popliteal stenting for disabling claudication on 02/03/2021.     I discussed that stents in his left leg remain patent.  He does have a persistent high-grade stenosis in the distal left SFA above-knee  popliteal stent with velocity 373.  I have recommended an angiogram with lower extremity arteriogram with a focus on the left leg.  Discussed this is to maintain primary patency.  Ultimately we could pursue balloon angioplasty with a drug-coated balloon versus additional stenting at that time in the SFApopliteal region pending angiogram findings.  Sean get scheduled in the Cath Lab.  Sean need to hold his Coumadin .  Lovenox  bridge okay.  He wants to schedule after his anniversary trip which is fine     Sean DOROTHA Gaskins, MD [facility] Office: [phone]

## 2023-12-13 NOTE — Op Note (Signed)
 Patient name: TISON LEIBOLD MRN: [id number] DOB: 18-Oct-1934 Sex: male  12/13/2023 Pre-operative Diagnosis: High grade in-stent stenosis left SFA above knee popliteal artery stents  Post-operative diagnosis:  Same Surgeon:  Lonni DOROTHA Gaskins, MD Procedure Performed: 1.  Ultrasound-guided access right common femoral artery 2.  Aortogram with catheter selection of aorta 3.  Left lower extremity arteriogram with catheter selection of the left popliteal artery 4.  Left above-knee popliteal artery shockwave lithotripsy for in-stent stenosis (5 mm x 80 mm shockwave balloon x 320 pulses) 5.  Mynx closure right common femoral artery  Indications: Patient is a 89 year old male that previously underwent left SFA above-knee popliteal stenting for disabling claudication on 02/03/2021.  Recent duplex in the office showed a high-grade stenosis in the distal left SFA above-knee popliteal stent.  He presents for lower extremity angiogram with possible invention with a focus on the left leg after risk benefits discussed.  Findings:   Ultrasound-guided access right common femoral artery.  Aortogram showed patent renals with a patent infrarenal aorta..  He has bilateral common iliac aneurysms that measure about 2 cm.  On the left his common femoral profunda and SFA were patent without flow-limiting stenosis.  The above-knee popliteal stent was also patent but distally there was a high-grade over 80% calcified focal stenosis.  Distally he had some disease in the TP trunk with two-vessel runoff in the peroneal and posterior tibial artery.  Ultimately the left above-knee popliteal in-stent stenosis was treated with a 5 mm x 80 mm shockwave balloon inflated to 2, 4, and 6 atm with a total 320 pulses.  Much more brisk flow with no evidence of significant residual stenosis.   Procedure:  The patient was identified in the holding area and taken to room 8.  The patient was then placed supine on the table and prepped  and draped in the usual sterile fashion.  A time out was called.  Ultrasound was used to evaluate the right common femoral artery.  It was patent .  A digital ultrasound image was acquired.  A micropuncture needle was used to access the right common femoral artery under ultrasound guidance.  An 018 wire was advanced without resistance and a micropuncture sheath was placed.  The 018 wire was removed and a benson wire was placed.  The micropuncture sheath was exchanged for a 5 french sheath.  An omniflush catheter was advanced over the wire to the level of L-1.  An abdominal angiogram was obtained.  Next, using the omniflush catheter and a benson wire, the aortic bifurcation was crossed and the catheter was placed into theleft external iliac artery and left runoff was obtained.  Pertinent findings are noted above.  Ultimately elected for intervention.  I used a Glidewire advantage down the left SFA and upsized to a 6 [place] catapult sheath in the right groin over the aortic bifurcation.  The patient was given 100 units/kg IV heparin .  I then went down through the left SFA popliteal stents with the Glidewire advantage and used a quick cross to exchanged for a 014 [place] core wire was placed down the left peroneal.  We went ahead and selected a 5 mm x 80 mm shockwave lithotripsy balloon that was inflated in the above-knee popliteal artery at 2 atm then 4 atm then 6 atm for a total of 320 pulses.  Excellent results with much more brisk flow.  Wires and catheters were removed.  A short 6 French sheath was placed in the right  groin.  Mynx closure deployed.  Plan: Excellent results today.  Follow-up in 1 month with left leg arterial duplex and ABIs.  Can resume Coumadin  tomorrow.  Lonni DOROTHA Gaskins, MD [facility] Office: [phone]

## 2023-12-19 ENCOUNTER — Ambulatory Visit: Attending: Internal Medicine

## 2023-12-19 DIAGNOSIS — Z5181 Encounter for therapeutic drug level monitoring: Secondary | ICD-10-CM | POA: Diagnosis not present

## 2023-12-19 DIAGNOSIS — Z953 Presence of xenogenic heart valve: Secondary | ICD-10-CM | POA: Diagnosis not present

## 2023-12-19 DIAGNOSIS — D6859 Other primary thrombophilia: Secondary | ICD-10-CM | POA: Diagnosis not present

## 2023-12-19 LAB — POCT INR: INR: 1.8 — AB (ref 2.0–3.0)

## 2023-12-19 NOTE — Patient Instructions (Signed)
 Take 1.5 tablets only then Continue 1/2 tablet daily, EXCEPT 1 TABLET EVERY WEDNESDAY.  Recheck INR in 4 weeks.  Coumadin  Clinic [phone].

## 2023-12-25 ENCOUNTER — Ambulatory Visit: Payer: Medicare Other

## 2023-12-26 ENCOUNTER — Encounter

## 2023-12-27 ENCOUNTER — Other Ambulatory Visit: Payer: Self-pay | Admitting: *Deleted

## 2023-12-27 ENCOUNTER — Encounter: Payer: Self-pay | Admitting: Cardiology

## 2023-12-27 ENCOUNTER — Ambulatory Visit: Attending: Cardiology | Admitting: Cardiology

## 2023-12-27 VITALS — BP 136/68 | HR 60 | Ht 75.0 in | Wt 183.4 lb

## 2023-12-27 DIAGNOSIS — I480 Paroxysmal atrial fibrillation: Secondary | ICD-10-CM

## 2023-12-27 DIAGNOSIS — I70213 Atherosclerosis of native arteries of extremities with intermittent claudication, bilateral legs: Secondary | ICD-10-CM

## 2023-12-27 NOTE — Patient Instructions (Signed)
 Medication Instructions:   Your physician recommends that you continue on your current medications as directed. Please refer to the Current Medication list given to you today.    *If you need a refill on your cardiac medications before your next appointment, please call your pharmacy*  Lab Work: No labs ordered today  If you have labs (blood work) drawn today and your tests are completely normal, you will receive your results only by: MyChart Message (if you have MyChart) OR A paper copy in the mail If you have any lab test that is abnormal or we need to change your treatment, we will call you to review the results.  Testing/Procedures: No test ordered today   Follow-Up: At [facility], you and your health needs are our priority.  As part of our continuing mission to provide you with exceptional heart care, our providers are all part of one team.  This team includes your primary Cardiologist (physician) and Advanced Practice Providers or APPs (Physician Assistants and Nurse Practitioners) who all work together to provide you with the care you need, when you need it.  Your next appointment:   6 month(s)  Provider:   Suzann Riddle, NP    We recommend signing up for the patient portal called MyChart.  Sign up information is provided on this After Visit Summary.  MyChart is used to connect with patients for Virtual Visits (Telemedicine).  Patients are able to view lab/test results, encounter notes, upcoming appointments, etc.  Non-urgent messages can be sent to your provider as well.   To learn more about what you can do with MyChart, go to [email or web address].

## 2023-12-27 NOTE — Progress Notes (Signed)
 Electrophysiology Clinic Note    Date:  12/27/2023  Patient ID:  Sean Brock, Sean Brock September 14, 1934, MRN [id number] PCP:  Auston Opal, DO  Cardiologist:  None  Electrophysiologist:  Danelle Birmingham, MD  Electrophysiology APP:  Eydan Chianese, NP    Discussed the use of AI scribe software for clinical note transcription with the patient, who gave verbal consent to proceed.   Patient Profile    Chief Complaint: 6mon follow-up  History of Present Illness: Sean Brock is a 89 y.o. male with PMH notable for SND s/p PPM, parox AFib, flutter s/p ablation, atrial tach, s/p AVR, CVA, CAD, HTN, orthostatic hypotension ; seen today for Danelle Birmingham, MD for routine electrophysiology followup.   He has not had any palpitation episodes since last visit. He denies chest pain, chest pressure, palpitations. He continues to take warfarin, last 2 INR checks have been subtherapeutic and so he is on lovenox  injections currently.   His wife is currently hospitalized.   His son-in-law joins for visit.     Arrhythmia/Device History Bos Sci dual chamber PPM, imp 12/2018; dx SND   AAD History: None      ROS:  Please see the history of present illness. All other systems are reviewed and otherwise negative.    Physical Exam    VS:  BP 136/68   Pulse 60   Ht 6' 3 (1.905 m)   Wt 183 lb 6.4 oz (83.2 kg)   SpO2 97%   BMI 22.92 kg/m  BMI: Body mass index is 22.92 kg/m.           Wt Readings from Last 3 Encounters:  12/27/23 183 lb 6.4 oz (83.2 kg)  12/13/23 180 lb (81.6 kg)  11/13/23 183 lb (83 kg)     GEN- The patient is well appearing, alert and oriented x 3 today.   Lungs- Clear to ausculation bilaterally, normal work of breathing.  Heart- Regular rate and rhythm with ecopty, no murmurs, rubs or gallops Extremities- No peripheral edema, warm, dry Skin-   device pocket well-healed, no tethering   Device interrogation done today and reviewed by myself:  Battery 8.5  years Lead thresholds, impedence, sensing stable  Rare, brief aflutter episodes, overall burden low No changes made today   Studies Reviewed   Previous EP, cardiology notes.    EKG is ordered. Personal review of EKG from today shows:    EKG Interpretation Date/Time:  Thursday December 27 2023 14:47:16 EDT Ventricular Rate:  60 PR Interval:  178 QRS Duration:  186 QT Interval:  492 QTC Calculation: 492 R Axis:   -9  Text Interpretation: AV dual-paced rhythm Confirmed by Kainalu Heggs [phone]) on 12/27/2023 4:14:59 PM    TTE, 04/28/2022  1. Left ventricular ejection fraction, by estimation, is 55 to 60%. The left ventricle has normal function. The left ventricle has no regional wall motion abnormalities. There is severe concentric left ventricular hypertrophy. Left ventricular diastolic  parameters are consistent with Grade II diastolic dysfunction (pseudonormalization).   2. Right ventricular systolic function is normal. The right ventricular size is Dilated. There is normal pulmonary artery systolic pressure.   3. Left atrial size was moderately dilated.   4. Right atrial size was severely dilated.   5. A small pericardial effusion is present. The pericardial effusion is posterior to the left ventricle.   6. The mitral valve is grossly normal. Mild mitral valve regurgitation. No evidence of mitral stenosis.   7. Variable color Doppler flow  of TR- appears severe in image 22 and less in other views. Tricuspid valve regurgitation estimated at moderate due to variance from images.   8. The aortic valve was not well visualized-23 mm [employer]. Aortic valve regurgitation is not visualized. Echo findings are consistent with normal structure and function of the aortic valve prosthesis. Aortic valve mean gradient measures 12.0 mmHg.   9. Aortic dilatation noted. There is mild dilatation of the ascending aorta, measuring 40 mm.  10. The inferior vena cava is normal in size with greater  than 50% respiratory variability, suggesting right atrial pressure of 3 mmHg.      Assessment and Plan     #) SND s/p PPM Device functioning well, see paceart for details High AP/VP No s/s of reduced LVEF  #) parox AFib #) aflutter #) atach Very low burden as of late, < 1 % Continue 25mg  lopressor  BID Continue warfarin as per [facility] office for stroke ppx         Current medicines are reviewed at length with the patient today.   The patient does not have concerns regarding his medicines.  The following changes were made today:  none  Labs/ tests ordered today include:  Orders Placed This Encounter  Procedures   EKG 12-Lead     Disposition: Follow up with Dr. Waddell or EP APP in 6 months   Signed, Amoria Mclees, NP  12/27/23  4:15 PM  Electrophysiology [facility]

## 2023-12-28 ENCOUNTER — Ambulatory Visit

## 2023-12-28 DIAGNOSIS — I480 Paroxysmal atrial fibrillation: Secondary | ICD-10-CM

## 2023-12-31 LAB — CUP PACEART REMOTE DEVICE CHECK
Battery Remaining Longevity: 102 mo
Battery Remaining Percentage: 100 %
Brady Statistic RA Percent Paced: 99 %
Brady Statistic RV Percent Paced: 99 %
Date Time Interrogation Session: 20251024205500
Implantable Lead Connection Status: 753985
Implantable Lead Connection Status: 753985
Implantable Lead Implant Date: 20201005
Implantable Lead Implant Date: 20201005
Implantable Lead Location: 753859
Implantable Lead Location: 753860
Implantable Lead Model: 7742
Implantable Lead Model: 7841
Implantable Lead Serial Number: 1017357
Implantable Lead Serial Number: 1068132
Implantable Pulse Generator Implant Date: 20201005
Lead Channel Impedance Value: 543 Ohm
Lead Channel Impedance Value: 659 Ohm
Lead Channel Pacing Threshold Amplitude: 1.5 V
Lead Channel Pacing Threshold Pulse Width: 0.4 ms
Lead Channel Setting Pacing Amplitude: 2.3 V
Lead Channel Setting Pacing Amplitude: 2.6 V
Lead Channel Setting Pacing Pulse Width: 0.4 ms
Lead Channel Setting Sensing Sensitivity: 3 mV
Pulse Gen Serial Number: 890709
Zone Setting Status: 755011

## 2024-01-01 NOTE — Progress Notes (Signed)
 Remote PPM Transmission

## 2024-01-03 ENCOUNTER — Ambulatory Visit: Payer: Self-pay | Admitting: Internal Medicine

## 2024-01-16 ENCOUNTER — Ambulatory Visit: Attending: Internal Medicine

## 2024-01-16 DIAGNOSIS — Z953 Presence of xenogenic heart valve: Secondary | ICD-10-CM | POA: Diagnosis not present

## 2024-01-16 DIAGNOSIS — D6859 Other primary thrombophilia: Secondary | ICD-10-CM

## 2024-01-16 DIAGNOSIS — Z5181 Encounter for therapeutic drug level monitoring: Secondary | ICD-10-CM | POA: Diagnosis not present

## 2024-01-16 LAB — POCT INR: INR: 1.8 — AB (ref 2.0–3.0)

## 2024-01-16 NOTE — Patient Instructions (Signed)
 Take 2 tablets tonight only then Continue 1/2 tablet daily, EXCEPT 1 TABLET EVERY WEDNESDAY.  Recheck INR in 4 weeks.  Coumadin  Clinic [phone].

## 2024-01-22 ENCOUNTER — Ambulatory Visit (HOSPITAL_COMMUNITY)
Admission: RE | Admit: 2024-01-22 | Discharge: 2024-01-22 | Disposition: A | Discharge status: Home / Self Care | Source: Ambulatory Visit | Attending: Vascular Surgery | Admitting: Vascular Surgery

## 2024-01-22 ENCOUNTER — Ambulatory Visit (HOSPITAL_COMMUNITY)
Admission: RE | Admit: 2024-01-22 | Discharge: 2024-01-22 | Disposition: A | Source: Ambulatory Visit | Attending: Vascular Surgery

## 2024-01-22 ENCOUNTER — Ambulatory Visit: Attending: Vascular Surgery | Admitting: Physician Assistant

## 2024-01-22 VITALS — BP 174/91 | HR 60 | Temp 98.0°F | Ht 75.0 in | Wt 186.0 lb

## 2024-01-22 DIAGNOSIS — I739 Peripheral vascular disease, unspecified: Secondary | ICD-10-CM

## 2024-01-22 DIAGNOSIS — I70213 Atherosclerosis of native arteries of extremities with intermittent claudication, bilateral legs: Secondary | ICD-10-CM

## 2024-01-22 LAB — VAS US ABI WITH/WO TBI
Left ABI: 1.05
Right ABI: 0.82

## 2024-01-22 NOTE — Progress Notes (Unsigned)
 Office Note     CC:  follow up Requesting Provider:  Auston Opal, DO  HPI: Sean Brock is a 89 y.o. ([birth year]) male who presents for follow up of PAD. He previously underwent left SFA above-knee popliteal stenting for disabling claudication on 02/03/2021 by Dr. Gretta.  Recent duplex in the office in September showed high-grade stenosis in the distal left SFA above-knee popliteal stent and Dr. Gretta recommended intervention to prevent stent occlusion. On 12/13/23 he underwent Aortogram, arteriogram of LLE with Left above-knee popliteal artery shockwave lithotripsy for in-stent stenosis. He was noted to have some disease in the TP trunk with two-vessel runoff in the peroneal and posterior tibial artery. Dr. Gretta felt that he had a really good result.   He is here today with his wife for non invasive studies. He denies any pain in his legs on ambulation or rest. No tissue loss. He reports doing well since his procedure. No issues with the access site in his right groin. He ambulates using a cane.  He is compliant with his Aspirin , Coumadin  and statin.  Past Medical History:  Diagnosis Date   AORTIC STENOSIS    a. Echo (05/26/13):  Mod LVH, vigorous LVF, EF 65-70%, no RWMA, Gr 1 DD, severe AS (mean 40 mmHg) => s/p bioprosthetic AVR 07/2013   Arthritis    BACK   Atrial fib/flutter, transient (HCC)    CAD (coronary artery disease)    a. Lexiscan  myoview  (5/13) with EF 70%, no ischemia or infarction;   b. LHC (06/2013):  Dist LM 30%, ostial LAD 40-50%, prox LAD 30%, ostial D1 80-90%, prox CFX 40%, RCA occluded with L-R collats => CABG (S-PDA at time of AVR)   CVA (cerebral infarction)    a. 3/14 right parietal infarct. Carotid US  3/14 showed no significant disease. He has an implanted loop recorder to look for atrial fibrillation.    DYSPEPSIA    ED (erectile dysfunction)    Former smoker    GERD (gastroesophageal reflux disease)    History of kidney stones    HYPERLIPIDEMIA    HYPERTENSION     Palpitations    a. Event monitor (5/13) with occasional PVCs, PACs but no significant arrhythmia.    Peripheral vascular disease    S/P aortic valve replacement with bioprosthetic valve + CABG x1 07/11/2013   23 mm [facility] Ease bovine pericardial tissue valve;  Echo (08/2013):  EF 55-60%, no RWMA, AVR ok, Asc Ao 40 mm (mildly dilated), mild LAE, mild RVE, lipomatous hypertrophy, mod TR, PASP 36 mmHg   S/P CABG x 1 07/11/2013   SVG to PDA with [facility] via right thigh   Sinus bradycardia    a. 2013 - HR 40's in the office. BB stopped.   Trigeminal neuropathy    Vertigo     Past Surgical History:  Procedure Laterality Date   A-FLUTTER ABLATION N/A 11/13/2018   Procedure: A-FLUTTER ABLATION;  Surgeon: Waddell Danelle ORN, MD;  Location: [facility];  Service: Cardiovascular;  Laterality: N/A;   ABDOMINAL AORTOGRAM N/A 12/13/2023   Procedure: ABDOMINAL AORTOGRAM;  Surgeon: Gretta Lonni PARAS, MD;  Location: [facility];  Service: Cardiovascular;  Laterality: N/A;   ABDOMINAL AORTOGRAM W/LOWER EXTREMITY N/A 02/03/2021   Procedure: ABDOMINAL AORTOGRAM W/LOWER EXTREMITY;  Surgeon: Gretta Lonni PARAS, MD;  Location: [facility];  Service: Cardiovascular;  Laterality: N/A;   AORTIC VALVE REPLACEMENT N/A 07/11/2013   Procedure: AORTIC VALVE REPLACEMENT (AVR);  Surgeon: Sudie DEL  Dusty, MD;  Location: [facility] OR;  Service: Open Heart Surgery;  Laterality: N/A;   CATARACT EXTRACTION     CORONARY ARTERY BYPASS GRAFT N/A 07/11/2013   Procedure: CORONARY ARTERY BYPASS GRAFTING (CABG) x1;  Surgeon: Sudie VEAR Dusty, MD;  Location: [facility] OR;  Service: Open Heart Surgery;  Laterality: N/A;   INGUINAL HERNIA REPAIR Right 01/15/2013   Procedure: HERNIA REPAIR INGUINAL ADULT;  Surgeon: Vicenta DELENA Poli, MD;  Location: [facility];  Service: General;  Laterality: Right;   INSERTION OF MESH Right 01/15/2013   Procedure: INSERTION OF MESH;  Surgeon: Vicenta DELENA Poli, MD;  Location:  [facility];  Service: General;  Laterality: Right;   INTRAOPERATIVE TRANSESOPHAGEAL ECHOCARDIOGRAM N/A 07/11/2013   Procedure: INTRAOPERATIVE TRANSESOPHAGEAL ECHOCARDIOGRAM;  Surgeon: Sudie VEAR Dusty, MD;  Location: [facility] OR;  Service: Open Heart Surgery;  Laterality: N/A;   LEFT AND RIGHT HEART CATHETERIZATION WITH CORONARY ANGIOGRAM N/A 06/06/2013   Procedure: LEFT AND RIGHT HEART CATHETERIZATION WITH CORONARY ANGIOGRAM;  Surgeon: Ezra GORMAN Shuck, MD;  Location: [facility] CATH LAB;  Service: Cardiovascular;  Laterality: N/A;   LITHOTRIPSY     LOOP RECORDER IMPLANT  04/15/2013   MDT LinQ implanred by Dr Waddell for cryptogenic stroke   LOOP RECORDER IMPLANT N/A 04/14/2013   Procedure: LOOP RECORDER IMPLANT;  Surgeon: Danelle LELON Waddell, MD;  Location: [facility] CATH LAB;  Service: Cardiovascular;  Laterality: N/A;   LOOP RECORDER REMOVAL N/A 11/13/2018   Procedure: LOOP RECORDER REMOVAL;  Surgeon: Waddell Danelle LELON, MD;  Location: [facility];  Service: Cardiovascular;  Laterality: N/A;   LOWER EXTREMITY ANGIOGRAPHY N/A 12/13/2023   Procedure: Lower Extremity Angiography;  Surgeon: Gretta Lonni PARAS, MD;  Location: [facility] INVASIVE CV LAB;  Service: Cardiovascular;  Laterality: N/A;   LOWER EXTREMITY INTERVENTION N/A 12/13/2023   Procedure: LOWER EXTREMITY INTERVENTION;  Surgeon: Gretta Lonni PARAS, MD;  [facility]: [facility];  Service: Cardiovascular;  Laterality: N/A;   MOUTH SURGERY     PACEMAKER IMPLANT N/A 12/09/2018   Procedure: PACEMAKER IMPLANT;  Surgeon: Waddell Danelle LELON, MD;  Location: [facility];  Service: Cardiovascular;  Laterality: N/A;   PERIPHERAL VASCULAR INTERVENTION Left 02/03/2021   Procedure: PERIPHERAL VASCULAR INTERVENTION;  Surgeon: Gretta Lonni PARAS, MD;  Location: [facility];  Service: Cardiovascular;  Laterality: Left;  sfa    Social History   Socioeconomic History   Marital status: Married    Spouse name: Not on file   Number of children: Not on file    Years of education: Not on file   Highest education level: Not on file  Occupational History   Not on file  Tobacco Use   Smoking status: Former    Current packs/day: 0.00    Average packs/day: 1 pack/day for 30.0 years (30.0 ttl pk-yrs)    Types: Cigarettes    Start date: 08/30/1979    Quit date: 08/29/2009    Years since quitting: 14.4   Smokeless tobacco: Never  Vaping Use   Vaping status: Never Used  Substance and Sexual Activity   Alcohol use: No   Drug use: No   Sexual activity: Not Currently  Other Topics Concern   Not on file  Social History Narrative   He resides in [place] with his wife and son. 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  No    grandchildren. He smokes half a pack per  day intermittently for the last 40    years. He drinks a glass of homemade wine mixed with diet Coke one time per    week. He denies any drugs, herbal medication, diet, or exercise program.         Social Drivers of Health   Financial Resource Strain: Not on file  Food Insecurity: Patient Declined (09/27/2022)   Hunger Vital Sign    Worried About Running Out of Food in the Last Year: Patient declined    Ran Out of Food in the Last Year: Patient declined  Transportation Needs: No Transportation Needs (09/27/2022)   PRAPARE - Administrator, Civil Service (Medical): No    Lack of Transportation (Non-Medical): No  Physical Activity: Not on file  Stress: Not on file  Social Connections: Not on file  Intimate Partner Violence: Patient Declined (09/27/2022)   Humiliation, Afraid, Rape, and Kick questionnaire    Fear of Current or Ex-Partner: Patient declined    Emotionally Abused: Patient declined    Physically Abused: Patient declined    Sexually Abused: Patient declined    Family History  Problem Relation Age of Onset   Diabetes Mother    Heart attack Mother    Heart attack Father    Hypertension Father    Diabetes Unknown     Hypertension Unknown    Lung cancer Unknown    Cancer Sister        breast   Stroke Neg Hx     Current Outpatient Medications  Medication Sig Dispense Refill   acetaminophen  (TYLENOL ) 500 MG tablet Take 500 mg by mouth every 6 (six) hours as needed for mild pain.      aspirin  EC 81 MG EC tablet Take 1 tablet (81 mg total) by mouth daily.     B Complex Vitamins (VITAMIN B COMPLEX) TABS Take 1 tablet by mouth daily at 6 (six) AM.     Carboxymethylcellul-Glycerin (CLEAR EYES FOR DRY EYES OP) Place 1 drop into both eyes daily.     Emollient (MEDERMA AG FACE EX) Apply 1 application topically daily as needed (on face for skin cancer).     enoxaparin  (LOVENOX ) 80 MG/0.8ML injection Inject 0.8 mLs (80 mg total) into the skin every 12 (twelve) hours. 16 mL 1   famotidine  (PEPCID ) 20 MG tablet Take 20 mg by mouth 2 (two) times daily.     ferrous sulfate  325 (65 FE) MG tablet Take 325 mg by mouth daily with breakfast.     metoprolol  tartrate (LOPRESSOR ) 25 MG tablet Take 1 tablet by mouth twice daily 180 tablet 3   polyethylene glycol (MIRALAX  / GLYCOLAX ) 17 g packet Take 17 g by mouth daily as needed (constipation.).     rosuvastatin  (CRESTOR ) 10 MG tablet Take 10 mg by mouth every evening.      tamsulosin  (FLOMAX ) 0.4 MG CAPS capsule Take 0.4 mg by mouth every evening.      traMADol  (ULTRAM ) 50 MG tablet Take 50 mg by mouth every 8 (eight) hours as needed for moderate pain (pain score 4-6) or severe pain (pain score 7-10).     warfarin (COUMADIN ) 5 MG tablet TAKE 1/2 TO 1 (ONE-HALF TO ONE) TABLET BY MOUTH ONCE DAILY (Patient taking differently: Take 2.5-5 mg by mouth See admin instructions. Take 2.5 mg every day, Except Wednesday, take 5 mg) 60 tablet 3   No current facility-administered medications for this visit.    Allergies  Allergen Reactions   Sudafed [Pseudoephedrine Hcl]  Palpitations   Tizanidine     Other Reaction(s): drowsy   Cardizem [Diltiazem Hcl] Rash   Sudafed [Pseudoephedrine]  Palpitations     REVIEW OF SYSTEMS:  Negative unless noted in HPI [X]  denotes positive finding, [ ]  denotes negative finding Cardiac  Comments:  Chest pain or chest pressure:    Shortness of breath upon exertion:    Short of breath when lying flat:    Irregular heart rhythm:        Vascular    Pain in calf, thigh, or hip brought on by ambulation:    Pain in feet at night that wakes you up from your sleep:     Blood clot in your veins:    Leg swelling:         Pulmonary    Oxygen at home:    Productive cough:     Wheezing:         Neurologic    Sudden weakness in arms or legs:     Sudden numbness in arms or legs:     Sudden onset of difficulty speaking or slurred speech:    Temporary loss of vision in one eye:     Problems with dizziness:         Gastrointestinal    Blood in stool:     Vomited blood:         Genitourinary    Burning when urinating:     Blood in urine:        Psychiatric    Major depression:         Hematologic    Bleeding problems:    Problems with blood clotting too easily:        Skin    Rashes or ulcers:        Constitutional    Fever or chills:      PHYSICAL EXAMINATION:  Vitals:   01/22/24 1348  BP: (!) 174/91  Pulse: 60  Temp: 98 F (36.7 C)  SpO2: 97%  Weight: 186 lb (84.4 kg)  Height: 6' 3 (1.905 m)    General:  WDWN in NAD; vital signs documented above Gait: ambulates with cane HENT: WNL, normocephalic Pulmonary: normal non-labored breathing Cardiac: regular HR Abdomen: soft Vascular Exam/Pulses: 2+ femoral pulses, palpable Right Dp and left DP, feet warm and well perfused Extremities: without ischemic changes, without Gangrene , without cellulitis; without open wounds;  Musculoskeletal: no muscle wasting or atrophy  Neurologic: A&O X 3 Psychiatric:  The pt has Normal affect.   Non-Invasive Vascular Imaging:   +-------+-----------+-----------+------------+------------+  ABI/TBIToday's ABIToday's TBIPrevious  ABIPrevious TBI  +-------+-----------+-----------+------------+------------+  Right 0.82       0.59       0.93        0.65          +-------+-----------+-----------+------------+------------+  Left  1.05       0.61       [place]          0.64          +-------+-----------+-----------+------------+------------+   VAS US  Lower Extremity Arterial Duplex left: +-----------+--------+-----+---------------+----------+------------+  LEFT      PSV cm/sRatioStenosis       Waveform  Comments      +-----------+--------+-----+---------------+----------+------------+  CFA Mid    141                         biphasic                +-----------+--------+-----+---------------+----------+------------+  DFA       132                         biphasic                +-----------+--------+-----+---------------+----------+------------+  SFA Prox   138                         biphasic                +-----------+--------+-----+---------------+----------+------------+  SFA Mid    238          50-74% stenosisbiphasic  before stent  +-----------+--------+-----+---------------+----------+------------+  POP Prox   185          30-49% stenosismonophasicpost stent    +-----------+--------+-----+---------------+----------+------------+  POP Distal 64                          biphasic                +-----------+--------+-----+---------------+----------+------------+  TP Trunk   14                          monophasic              +-----------+--------+-----+---------------+----------+------------+  ATA Distal 65                          biphasic                +-----------+--------+-----+---------------+----------+------------+  PTA Distal 28                          biphasic                +-----------+--------+-----+---------------+----------+------------+  PERO Distal36                          biphasic                 +-----------+--------+-----+---------------+----------+------------+     Left Stent(s):  +-------------------+--------+--------+----------+--------+  SFA mid to pop proxPSV cm/sStenosisWaveform  Comments  +-------------------+--------+--------+----------+--------+  Prox to Stent      160             biphasic            +-------------------+--------+--------+----------+--------+  Proximal Stent     185             biphasic            +-------------------+--------+--------+----------+--------+  Mid Stent          50              biphasic            +-------------------+--------+--------+----------+--------+  Distal Stent       39              biphasic            +-------------------+--------+--------+----------+--------+  Distal to Stent    52              monophasic          +-------------------+--------+--------+----------+--------+     Summary:  Left: 50-74% stenosis is visualized in the mid superficial femoral artery. 30-49% stenosis is seen in the proximal popliteal artery. Patent three vessel run-off to the foot. Patent mid  superficial femoral artery to prox popliteal artery.   ASSESSMENT/PLAN:: 89 y.o. male here for follow up of PAD. He previously underwent left SFA above-knee popliteal stenting for disabling claudication on 02/03/2021 by Dr. Gretta. On 12/13/23 he underwent Aortogram, arteriogram of LLE with Left above-knee popliteal artery shockwave lithotripsy for in-stent stenosis. He was noted to have some disease in the TP trunk with two-vessel runoff in the peroneal and posterior tibial artery. He is without any claudication, rest pain or tissue loss. He has palpable DP pulses bilaterally.  - Duplex shows some elevated velocities in the area treated in the left SFA. The velocities are below what they were pre intervention. Otherwise left leg is patent with three vessel runoff - ABI is overall stable bilaterally - continue statin, coumadin  and  Aspirin  - 3 month f/u with ABI and LLE arterial duplex   Teretha Damme, PA-C [facility] [phone]  On call MD:   [place]

## 2024-01-23 ENCOUNTER — Encounter: Payer: Self-pay | Admitting: Physician Assistant

## 2024-01-23 ENCOUNTER — Other Ambulatory Visit: Payer: Self-pay | Admitting: *Deleted

## 2024-01-23 DIAGNOSIS — I70213 Atherosclerosis of native arteries of extremities with intermittent claudication, bilateral legs: Secondary | ICD-10-CM

## 2024-02-13 ENCOUNTER — Ambulatory Visit: Attending: Internal Medicine

## 2024-02-13 DIAGNOSIS — Z953 Presence of xenogenic heart valve: Secondary | ICD-10-CM

## 2024-02-13 DIAGNOSIS — D6859 Other primary thrombophilia: Secondary | ICD-10-CM

## 2024-02-13 DIAGNOSIS — Z5181 Encounter for therapeutic drug level monitoring: Secondary | ICD-10-CM

## 2024-02-13 LAB — POCT INR: INR: 2.2 (ref 2.0–3.0)

## 2024-02-13 NOTE — Patient Instructions (Signed)
 Continue 1/2 tablet daily, EXCEPT 1 TABLET EVERY WEDNESDAY.  Recheck INR in 6 weeks.   Coumadin  Clinic [phone].

## 2024-03-25 ENCOUNTER — Ambulatory Visit: Payer: Medicare Other

## 2024-03-26 ENCOUNTER — Ambulatory Visit: Attending: Internal Medicine

## 2024-03-26 DIAGNOSIS — Z953 Presence of xenogenic heart valve: Secondary | ICD-10-CM

## 2024-03-26 DIAGNOSIS — Z5181 Encounter for therapeutic drug level monitoring: Secondary | ICD-10-CM

## 2024-03-26 DIAGNOSIS — D6859 Other primary thrombophilia: Secondary | ICD-10-CM

## 2024-03-26 LAB — POCT INR: INR: 3.3 — AB (ref 2.0–3.0)

## 2024-03-26 NOTE — Patient Instructions (Signed)
 Continue 1/2 tablet daily, EXCEPT 1 TABLET EVERY WEDNESDAY.  Recheck INR in 6 weeks. Eat greens tonight or tomorrow. Coumadin  Clinic [phone].

## 2024-03-28 ENCOUNTER — Ambulatory Visit

## 2024-03-28 DIAGNOSIS — D6859 Other primary thrombophilia: Secondary | ICD-10-CM

## 2024-04-02 LAB — CUP PACEART REMOTE DEVICE CHECK
Battery Remaining Longevity: 102 mo
Battery Remaining Percentage: 96 %
Brady Statistic RA Percent Paced: 98 %
Brady Statistic RV Percent Paced: 98 %
Date Time Interrogation Session: 20260128105500
Implantable Lead Connection Status: 753985
Implantable Lead Connection Status: 753985
Implantable Lead Implant Date: 20201005
Implantable Lead Implant Date: 20201005
Implantable Lead Location: 753859
Implantable Lead Location: 753860
Implantable Lead Model: 7742
Implantable Lead Model: 7841
Implantable Lead Serial Number: 1017357
Implantable Lead Serial Number: 1068132
Implantable Pulse Generator Implant Date: 20201005
Lead Channel Impedance Value: 539 Ohm
Lead Channel Impedance Value: 646 Ohm
Lead Channel Pacing Threshold Amplitude: 1.6 V
Lead Channel Pacing Threshold Pulse Width: 0.4 ms
Lead Channel Setting Pacing Amplitude: 2.3 V
Lead Channel Setting Pacing Amplitude: 2.6 V
Lead Channel Setting Pacing Pulse Width: 0.4 ms
Lead Channel Setting Sensing Sensitivity: 3 mV
Pulse Gen Serial Number: 890709
Zone Setting Status: 755011

## 2024-04-03 NOTE — Progress Notes (Signed)
 Remote PPM Transmission

## 2024-04-06 ENCOUNTER — Ambulatory Visit: Payer: Self-pay | Admitting: Cardiology

## 2024-04-15 ENCOUNTER — Ambulatory Visit

## 2024-04-15 ENCOUNTER — Ambulatory Visit (HOSPITAL_COMMUNITY)

## 2024-05-07 ENCOUNTER — Ambulatory Visit

## 2024-05-14 ENCOUNTER — Ambulatory Visit

## 2024-06-24 ENCOUNTER — Ambulatory Visit: Payer: Medicare Other

## 2024-06-27 ENCOUNTER — Ambulatory Visit

## 2024-09-23 ENCOUNTER — Ambulatory Visit: Payer: Medicare Other

## 2024-09-26 ENCOUNTER — Ambulatory Visit

## 2024-12-23 ENCOUNTER — Ambulatory Visit: Payer: Medicare Other

## 2024-12-26 ENCOUNTER — Ambulatory Visit

## 2025-03-24 ENCOUNTER — Ambulatory Visit: Payer: Medicare Other

## 2025-06-23 ENCOUNTER — Ambulatory Visit: Payer: Medicare Other
